# Patient Record
Sex: Male | Born: 1965 | Race: White | Hispanic: No | Marital: Married | Smoking: Never smoker
Health system: Southern US, Community
[De-identification: ages and names within clinical notes are randomized; demographics above are authoritative.]

## PROBLEM LIST (undated history)

## (undated) DIAGNOSIS — I10 Essential (primary) hypertension: Secondary | ICD-10-CM

## (undated) DIAGNOSIS — C7951 Secondary malignant neoplasm of bone: Secondary | ICD-10-CM

## (undated) DIAGNOSIS — Z809 Family history of malignant neoplasm, unspecified: Secondary | ICD-10-CM

## (undated) DIAGNOSIS — C3490 Malignant neoplasm of unspecified part of unspecified bronchus or lung: Secondary | ICD-10-CM

## (undated) DIAGNOSIS — Z5111 Encounter for antineoplastic chemotherapy: Secondary | ICD-10-CM

## (undated) HISTORY — DX: Secondary malignant neoplasm of bone: C79.51

## (undated) HISTORY — DX: Essential (primary) hypertension: I10

## (undated) HISTORY — PX: VASECTOMY: SHX75

## (undated) HISTORY — DX: Malignant neoplasm of unspecified part of unspecified bronchus or lung: C34.90

## (undated) HISTORY — DX: Family history of malignant neoplasm, unspecified: Z80.9

## (undated) HISTORY — DX: Encounter for antineoplastic chemotherapy: Z51.11

---

## 2012-10-20 DIAGNOSIS — IMO0001 Reserved for inherently not codable concepts without codable children: Secondary | ICD-10-CM | POA: Insufficient documentation

## 2013-11-28 DIAGNOSIS — K219 Gastro-esophageal reflux disease without esophagitis: Secondary | ICD-10-CM | POA: Insufficient documentation

## 2014-03-11 DIAGNOSIS — R059 Cough, unspecified: Secondary | ICD-10-CM | POA: Insufficient documentation

## 2014-03-11 DIAGNOSIS — R05 Cough: Secondary | ICD-10-CM | POA: Insufficient documentation

## 2014-03-13 DIAGNOSIS — R9389 Abnormal findings on diagnostic imaging of other specified body structures: Secondary | ICD-10-CM | POA: Insufficient documentation

## 2014-03-14 ENCOUNTER — Other Ambulatory Visit (HOSPITAL_COMMUNITY): Payer: Self-pay | Admitting: Family Medicine

## 2014-03-14 DIAGNOSIS — R222 Localized swelling, mass and lump, trunk: Secondary | ICD-10-CM

## 2014-03-19 ENCOUNTER — Encounter: Payer: Self-pay | Admitting: Pulmonary Disease

## 2014-03-19 ENCOUNTER — Ambulatory Visit (INDEPENDENT_AMBULATORY_CARE_PROVIDER_SITE_OTHER): Payer: BC Managed Care – PPO | Admitting: Pulmonary Disease

## 2014-03-19 VITALS — BP 118/84 | HR 87 | Temp 98.2°F | Ht 74.0 in | Wt 219.6 lb

## 2014-03-19 DIAGNOSIS — R222 Localized swelling, mass and lump, trunk: Secondary | ICD-10-CM

## 2014-03-19 DIAGNOSIS — R911 Solitary pulmonary nodule: Secondary | ICD-10-CM

## 2014-03-19 DIAGNOSIS — R918 Other nonspecific abnormal finding of lung field: Secondary | ICD-10-CM

## 2014-03-19 NOTE — Patient Instructions (Signed)
Bronchoscopy scheduled for Wednesday 1 pm at [facility]  We discussed risks & benefits Nothing to eat after 6am  - Liquid OK prior I will call Dr Cyndia Bent

## 2014-03-19 NOTE — Progress Notes (Signed)
   Subjective:    Patient ID: Oscar Floyd, male    DOB: 17-Mar-1966, 48 y.o.   MRN: [id number]  HPI PCP- Bouska  48 year old never smoker presents for evaluation of abnormal imaging.he is accompanied by his wife Oscar Floyd. He presented to his PCP with a dry cough about 4 weeks ago . He was treated with a prednisone taper and antibiotic. Symptoms persisted and he developed shortness of breath which he would notice around the ninth hole at golf, chest x-ray suggested a right hilar mass. CT chest with contrast was obtained at [facility], this showed a 3.5x4.9 cm mass in the medial right upper lobe. There were innumerable less than 1 cm nodules throughout bilateral lungs. There is no mediastinal or hilar lymphadenopathy. There were many less than 1 cm low-density lesions in the liver. Lytic lesions were noted throughout the spine manubrium sternum and bilateral ribs. Hence he is referred for further evaluation. He denies weight loss, hemoptysis, loss of appetite or bone pains. He takes Nexium for GERD  No past medical history on file.  Past Surgical History  Procedure Laterality Date  . Vasectomy      No Known Allergies  History   Social History  . Marital Status: Married    Spouse Name: N/A    Number of Children: N/A  . Years of Education: N/A   Occupational History  . Not on file.   Social History Main Topics  . Smoking status: Never Smoker   . Smokeless tobacco: Not on file  . Alcohol Use: Yes     Comment: socially  . Drug Use: No  . Sexual Activity: Not on file   Other Topics Concern  . Not on file   Social History Narrative  . No narrative on file    Family History  Problem Relation Age of Onset  . Cancer Father     Review of Systems  Constitutional: Negative for fever and unexpected weight change.  HENT: Negative for congestion, dental problem, ear pain, nosebleeds, postnasal drip, rhinorrhea, sinus pressure, sneezing, sore throat and trouble  swallowing.   Eyes: Negative for redness and itching.  Respiratory: Positive for cough and shortness of breath. Negative for chest tightness and wheezing.   Cardiovascular: Negative for palpitations and leg swelling.  Gastrointestinal: Negative for nausea and vomiting.  Genitourinary: Negative for dysuria.  Musculoskeletal: Negative for joint swelling.  Skin: Negative for rash.  Neurological: Negative for headaches.  Hematological: Does not bruise/bleed easily.  Psychiatric/Behavioral: Negative for dysphoric mood. The patient is not nervous/anxious.        Objective:   Physical Exam  Gen. Pleasant, well-nourished, in no distress, normal affect ENT - no lesions, no post nasal drip Neck: No JVD, no thyromegaly, no carotid bruits Lungs: no use of accessory muscles, no dullness to percussion, clear without rales or rhonchi  Cardiovascular: Rhythm regular, heart sounds  normal, no murmurs or gallops, no peripheral edema Abdomen: soft and non-tender, no hepatosplenomegaly, BS normal. Musculoskeletal: No deformities, no cyanosis or clubbing Neuro:  alert, non focal       Assessment & Plan:

## 2014-03-19 NOTE — Assessment & Plan Note (Signed)
Unfortunately his CT findings are very concerning for metastatic lung malignancy. The primary mass appears to be in the right upper lobe and we can see and airway leading into it. I reviewed the images with the patient and his wife. The various options of biopsy including bronchoscopy, CT guided needle aspiration and surgical biopsy were discussed.The risks of each procedure including coughing, bleeding and the  chances of lung puncture requiring chest tube were discussed in great detail. The benefits & alternatives including serial follow up were also discussed. We will proceed with bronchoscopy under fluoroscopy with the plan to obtain brushings and transbronchial biopsies from the right upper lobe. I would expect the use of 80% with this procedure. If negative then we may have to plan for a second navigation procedure.

## 2014-03-20 ENCOUNTER — Ambulatory Visit (HOSPITAL_COMMUNITY): Payer: BC Managed Care – PPO

## 2014-03-20 ENCOUNTER — Ambulatory Visit (HOSPITAL_COMMUNITY)
Admission: RE | Admit: 2014-03-20 | Discharge: 2014-03-20 | Disposition: A | Discharge status: Home / Self Care | Payer: BC Managed Care – PPO | Source: Ambulatory Visit | Attending: Pulmonary Disease | Admitting: Pulmonary Disease

## 2014-03-20 ENCOUNTER — Encounter (HOSPITAL_COMMUNITY)
Admission: RE | Disposition: A | Discharge status: Home / Self Care | Payer: BC Managed Care – PPO | Source: Ambulatory Visit | Attending: Pulmonary Disease

## 2014-03-20 DIAGNOSIS — K219 Gastro-esophageal reflux disease without esophagitis: Secondary | ICD-10-CM | POA: Insufficient documentation

## 2014-03-20 DIAGNOSIS — M899 Disorder of bone, unspecified: Secondary | ICD-10-CM | POA: Insufficient documentation

## 2014-03-20 DIAGNOSIS — J984 Other disorders of lung: Secondary | ICD-10-CM | POA: Insufficient documentation

## 2014-03-20 DIAGNOSIS — R222 Localized swelling, mass and lump, trunk: Secondary | ICD-10-CM | POA: Diagnosis not present

## 2014-03-20 DIAGNOSIS — M949 Disorder of cartilage, unspecified: Secondary | ICD-10-CM

## 2014-03-20 DIAGNOSIS — C3490 Malignant neoplasm of unspecified part of unspecified bronchus or lung: Secondary | ICD-10-CM | POA: Insufficient documentation

## 2014-03-20 DIAGNOSIS — R918 Other nonspecific abnormal finding of lung field: Secondary | ICD-10-CM

## 2014-03-20 HISTORY — PX: VIDEO BRONCHOSCOPY: SHX5072

## 2014-03-20 SURGERY — BRONCHOSCOPY, WITH FLUOROSCOPY
Anesthesia: Moderate Sedation | Laterality: Bilateral

## 2014-03-20 MED ORDER — LIDOCAINE HCL 2 % EX GEL
CUTANEOUS | Status: DC | PRN
  Administered 2014-03-20: 1

## 2014-03-20 MED ORDER — FENTANYL CITRATE 0.05 MG/ML IJ SOLN
INTRAMUSCULAR | Status: DC | PRN
  Administered 2014-03-20 (×7): 25 ug via INTRAVENOUS

## 2014-03-20 MED ORDER — BUTAMBEN-TETRACAINE-BENZOCAINE 2-2-14 % EX AERO: 1.0000 | INHALATION_SPRAY | Freq: Once | CUTANEOUS | Status: DC

## 2014-03-20 MED ORDER — MIDAZOLAM HCL 10 MG/2ML IJ SOLN
INTRAMUSCULAR | Status: AC
  Filled 2014-03-20: qty 4

## 2014-03-20 MED ORDER — FENTANYL CITRATE 0.05 MG/ML IJ SOLN
INTRAMUSCULAR | Status: AC
  Filled 2014-03-20: qty 4

## 2014-03-20 MED ORDER — PHENYLEPHRINE HCL 0.25 % NA SOLN: 1.0000 | Freq: Four times a day (QID) | NASAL | Status: DC | PRN

## 2014-03-20 MED ORDER — PHENYLEPHRINE HCL 0.25 % NA SOLN
NASAL | Status: DC | PRN
  Administered 2014-03-20: 2 via NASAL

## 2014-03-20 MED ORDER — MIDAZOLAM HCL 10 MG/2ML IJ SOLN
INTRAMUSCULAR | Status: DC | PRN
  Administered 2014-03-20 (×7): 1 mg via INTRAVENOUS

## 2014-03-20 MED ORDER — SODIUM CHLORIDE 0.9 % IV SOLN
INTRAVENOUS | Status: DC
  Administered 2014-03-20: 13:00:00 via INTRAVENOUS

## 2014-03-20 MED ORDER — LIDOCAINE HCL 1 % IJ SOLN
INTRAMUSCULAR | Status: DC | PRN
  Administered 2014-03-20: 6 mL via RESPIRATORY_TRACT

## 2014-03-20 MED ORDER — LIDOCAINE HCL 2 % EX GEL: 1.0000 "application " | Freq: Once | CUTANEOUS | Status: DC

## 2014-03-20 NOTE — Progress Notes (Signed)
Video bronchoscopy performed Intervention bronchial washings Intervention bronchial biopsy Intervention bronchial brushings Pt tolerated well  Kathie Dike RRT

## 2014-03-20 NOTE — Op Note (Signed)
Indication : RUL mass in this never smoker with liver & bone lesions Written informed consent was obtained prior to the procedure. The risks of the procedure including coughing, bleeding and the small chance of lung puncture requiring chest tube were discussed in great detail. The benefits & alternatives including serial follow up were also discussed. Time out was performed   7 mg versed & 175 mcg fentnayl used in divided doses during the procedure Bronchoscope entered from the right nare. Upper airway nml Vocal cords showed nml appearance & motion. Trachea & bronchial tree examined to the subsegmental level. No endobronchial lesions were noted. The Apical & posterior segments appeared narrowed but open distally. Mild amount of white secretions were noted.  Brushings were obtained from all sub segments of the RUL Trans bronchial biopsies x 6 were obtained from the RUL, anterior segment under fluoroscopy. BAL was also obtained from the RUL.  There was moderate coughing  during the procedure.   A CXR will be performed to r/o presence of pneumothorax.  Rigoberto Noel MD  [phone]

## 2014-03-20 NOTE — Discharge Instructions (Signed)
Flexible Bronchoscopy, Care After These instructions give you information on caring for yourself after your procedure. Your doctor may also give you more specific instructions. Call your doctor if you have any problems or questions after your procedure. HOME CARE  Do not eat or drink anything for 2 hours after your procedure. If you try to eat or drink before the medicine wears off, food or drink could go into your lungs. You could also burn yourself.  After 2 hours have passed and when you can cough and gag normally, you may eat soft food and drink liquids slowly.  The day after the test, you may eat your normal diet.  You may do your normal activities.  Keep all doctor visits. GET HELP RIGHT AWAY IF:  You get more and more short of breath.  You get light-headed.  You feel like you are going to pass out (faint).  You have chest pain.  You have new problems that worry you.  You cough up more than a little blood.  You cough up more blood than before. MAKE SURE YOU:  Understand these instructions.  Will watch your condition.  Will get help right away if you are not doing well or get worse. Document Released: 06/27/2009 Document Revised: 09/04/2013 Document Reviewed: 05/04/2013 [facility] Patient Information 2015 [place], [place]. This information is not intended to replace advice given to you by your health care provider. Make sure you discuss any questions you have with your health care provider.  Do not eat or drink until after  3:40 PM Today 03/20/2014

## 2014-03-20 NOTE — H&P (View-Only) (Signed)
   Subjective:    Patient ID: Oscar Floyd, male    DOB: 1966/08/03, 48 y.o.   MRN: [id number]  HPI PCP- Bouska  48 year old never smoker presents for evaluation of abnormal imaging.he is accompanied by his wife Oscar Floyd. He presented to his PCP with a dry cough about 4 weeks ago . He was treated with a prednisone taper and antibiotic. Symptoms persisted and he developed shortness of breath which he would notice around the ninth hole at golf, chest x-ray suggested a right hilar mass. CT chest with contrast was obtained at [facility], this showed a 3.5x4.9 cm mass in the medial right upper lobe. There were innumerable less than 1 cm nodules throughout bilateral lungs. There is no mediastinal or hilar lymphadenopathy. There were many less than 1 cm low-density lesions in the liver. Lytic lesions were noted throughout the spine manubrium sternum and bilateral ribs. Hence he is referred for further evaluation. He denies weight loss, hemoptysis, loss of appetite or bone pains. He takes Nexium for GERD  No past medical history on file.  Past Surgical History  Procedure Laterality Date  . Vasectomy      No Known Allergies  History   Social History  . Marital Status: Married    Spouse Name: N/A    Number of Children: N/A  . Years of Education: N/A   Occupational History  . Not on file.   Social History Main Topics  . Smoking status: Never Smoker   . Smokeless tobacco: Not on file  . Alcohol Use: Yes     Comment: socially  . Drug Use: No  . Sexual Activity: Not on file   Other Topics Concern  . Not on file   Social History Narrative  . No narrative on file    Family History  Problem Relation Age of Onset  . Cancer Father     Review of Systems  Constitutional: Negative for fever and unexpected weight change.  HENT: Negative for congestion, dental problem, ear pain, nosebleeds, postnasal drip, rhinorrhea, sinus pressure, sneezing, sore throat and trouble  swallowing.   Eyes: Negative for redness and itching.  Respiratory: Positive for cough and shortness of breath. Negative for chest tightness and wheezing.   Cardiovascular: Negative for palpitations and leg swelling.  Gastrointestinal: Negative for nausea and vomiting.  Genitourinary: Negative for dysuria.  Musculoskeletal: Negative for joint swelling.  Skin: Negative for rash.  Neurological: Negative for headaches.  Hematological: Does not bruise/bleed easily.  Psychiatric/Behavioral: Negative for dysphoric mood. The patient is not nervous/anxious.        Objective:   Physical Exam  Gen. Pleasant, well-nourished, in no distress, normal affect ENT - no lesions, no post nasal drip Neck: No JVD, no thyromegaly, no carotid bruits Lungs: no use of accessory muscles, no dullness to percussion, clear without rales or rhonchi  Cardiovascular: Rhythm regular, heart sounds  normal, no murmurs or gallops, no peripheral edema Abdomen: soft and non-tender, no hepatosplenomegaly, BS normal. Musculoskeletal: No deformities, no cyanosis or clubbing Neuro:  alert, non focal       Assessment & Plan:

## 2014-03-20 NOTE — Interval H&P Note (Signed)
History and Physical Interval Note:  03/20/2014 1:17 PM  Oscar Floyd  has presented today for surgery, with the diagnosis of lung mass  The various methods of treatment have been discussed with the patient and family. After consideration of risks, benefits and other options for treatment, the patient has consented to  Procedure(s): VIDEO BRONCHOSCOPY WITH FLUORO (Bilateral) and biopsy as a surgical intervention .  The patient's history has been reviewed, patient examined, no change in status, stable for surgery.  I have reviewed the patient's chart and labs.  Questions were answered to the patient's satisfaction.     Oscar Buss V.

## 2014-03-21 ENCOUNTER — Encounter (HOSPITAL_COMMUNITY): Payer: Self-pay | Admitting: Pulmonary Disease

## 2014-03-21 ENCOUNTER — Telehealth: Payer: Self-pay | Admitting: *Deleted

## 2014-03-21 NOTE — Telephone Encounter (Signed)
Called and unable to reach pt.  Will call back

## 2014-03-21 NOTE — Telephone Encounter (Signed)
Spoke with pt set up appt.

## 2014-03-23 LAB — CULTURE, BAL-QUANTITATIVE

## 2014-03-23 LAB — CULTURE, BAL-QUANTITATIVE W GRAM STAIN: Colony Count: 75000

## 2014-03-25 ENCOUNTER — Encounter: Payer: Self-pay | Admitting: *Deleted

## 2014-03-25 ENCOUNTER — Ambulatory Visit (HOSPITAL_COMMUNITY)
Admission: RE | Admit: 2014-03-25 | Discharge: 2014-03-25 | Disposition: A | Discharge status: Home / Self Care | Payer: BC Managed Care – PPO | Source: Ambulatory Visit | Attending: Pulmonary Disease | Admitting: Pulmonary Disease

## 2014-03-25 ENCOUNTER — Other Ambulatory Visit: Payer: Self-pay | Admitting: Internal Medicine

## 2014-03-25 ENCOUNTER — Telehealth: Payer: Self-pay | Admitting: *Deleted

## 2014-03-25 DIAGNOSIS — C3491 Malignant neoplasm of unspecified part of right bronchus or lung: Secondary | ICD-10-CM | POA: Insufficient documentation

## 2014-03-25 DIAGNOSIS — C7951 Secondary malignant neoplasm of bone: Secondary | ICD-10-CM | POA: Insufficient documentation

## 2014-03-25 DIAGNOSIS — C7952 Secondary malignant neoplasm of bone marrow: Secondary | ICD-10-CM

## 2014-03-25 DIAGNOSIS — K7689 Other specified diseases of liver: Secondary | ICD-10-CM | POA: Insufficient documentation

## 2014-03-25 DIAGNOSIS — C3480 Malignant neoplasm of overlapping sites of unspecified bronchus and lung: Secondary | ICD-10-CM

## 2014-03-25 DIAGNOSIS — R911 Solitary pulmonary nodule: Secondary | ICD-10-CM

## 2014-03-25 DIAGNOSIS — C3490 Malignant neoplasm of unspecified part of unspecified bronchus or lung: Secondary | ICD-10-CM | POA: Insufficient documentation

## 2014-03-25 LAB — GLUCOSE, CAPILLARY: Glucose-Capillary: 99 mg/dL (ref 70–99)

## 2014-03-25 MED ORDER — FLUDEOXYGLUCOSE F - 18 (FDG) INJECTION: 11.2000 | Freq: Once | INTRAVENOUS | Status: AC | PRN

## 2014-03-25 NOTE — Telephone Encounter (Signed)
Called left vm message to call

## 2014-03-25 NOTE — [facility] Oncology Navigator Note (Unsigned)
Called pathology at [facility] requesting tissue to be sent for genetic mutation.  Oscar Floyd stated tissue was sent 03/22/14 to [facility] for EGFR/ALK

## 2014-03-26 ENCOUNTER — Encounter: Payer: Self-pay | Admitting: Radiation Oncology

## 2014-03-26 ENCOUNTER — Encounter: Payer: Self-pay | Admitting: *Deleted

## 2014-03-26 ENCOUNTER — Other Ambulatory Visit (HOSPITAL_BASED_OUTPATIENT_CLINIC_OR_DEPARTMENT_OTHER): Payer: BC Managed Care – PPO

## 2014-03-26 ENCOUNTER — Telehealth: Payer: Self-pay | Admitting: *Deleted

## 2014-03-26 ENCOUNTER — Other Ambulatory Visit: Payer: Self-pay | Admitting: Internal Medicine

## 2014-03-26 ENCOUNTER — Ambulatory Visit (HOSPITAL_BASED_OUTPATIENT_CLINIC_OR_DEPARTMENT_OTHER): Payer: BC Managed Care – PPO | Admitting: Internal Medicine

## 2014-03-26 ENCOUNTER — Telehealth: Payer: Self-pay | Admitting: Internal Medicine

## 2014-03-26 ENCOUNTER — Encounter: Payer: Self-pay | Admitting: Internal Medicine

## 2014-03-26 ENCOUNTER — Ambulatory Visit (HOSPITAL_COMMUNITY)
Admission: RE | Admit: 2014-03-26 | Discharge: 2014-03-26 | Disposition: A | Discharge status: Home / Self Care | Payer: BC Managed Care – PPO | Source: Ambulatory Visit | Attending: Internal Medicine | Admitting: Internal Medicine

## 2014-03-26 VITALS — BP 145/90 | HR 73 | Temp 97.9°F | Resp 18 | Ht 74.0 in | Wt 218.2 lb

## 2014-03-26 DIAGNOSIS — M545 Low back pain, unspecified: Secondary | ICD-10-CM

## 2014-03-26 DIAGNOSIS — C7951 Secondary malignant neoplasm of bone: Secondary | ICD-10-CM

## 2014-03-26 DIAGNOSIS — R059 Cough, unspecified: Secondary | ICD-10-CM

## 2014-03-26 DIAGNOSIS — R0609 Other forms of dyspnea: Secondary | ICD-10-CM

## 2014-03-26 DIAGNOSIS — C787 Secondary malignant neoplasm of liver and intrahepatic bile duct: Secondary | ICD-10-CM

## 2014-03-26 DIAGNOSIS — C7949 Secondary malignant neoplasm of other parts of nervous system: Secondary | ICD-10-CM

## 2014-03-26 DIAGNOSIS — C3411 Malignant neoplasm of upper lobe, right bronchus or lung: Secondary | ICD-10-CM

## 2014-03-26 DIAGNOSIS — R0989 Other specified symptoms and signs involving the circulatory and respiratory systems: Secondary | ICD-10-CM

## 2014-03-26 DIAGNOSIS — C3410 Malignant neoplasm of upper lobe, unspecified bronchus or lung: Secondary | ICD-10-CM

## 2014-03-26 DIAGNOSIS — C7931 Secondary malignant neoplasm of brain: Secondary | ICD-10-CM

## 2014-03-26 DIAGNOSIS — C7952 Secondary malignant neoplasm of bone marrow: Secondary | ICD-10-CM

## 2014-03-26 DIAGNOSIS — C3490 Malignant neoplasm of unspecified part of unspecified bronchus or lung: Secondary | ICD-10-CM | POA: Insufficient documentation

## 2014-03-26 DIAGNOSIS — C3480 Malignant neoplasm of overlapping sites of unspecified bronchus and lung: Secondary | ICD-10-CM

## 2014-03-26 DIAGNOSIS — C3491 Malignant neoplasm of unspecified part of right bronchus or lung: Secondary | ICD-10-CM

## 2014-03-26 DIAGNOSIS — R05 Cough: Secondary | ICD-10-CM

## 2014-03-26 DIAGNOSIS — G936 Cerebral edema: Secondary | ICD-10-CM | POA: Insufficient documentation

## 2014-03-26 LAB — CBC WITH DIFFERENTIAL/PLATELET
BASO%: 0.3 % (ref 0.0–2.0)
Basophils Absolute: 0 10*3/uL (ref 0.0–0.1)
EOS%: 2.5 % (ref 0.0–7.0)
Eosinophils Absolute: 0.2 10*3/uL (ref 0.0–0.5)
HCT: 44.7 % (ref 38.4–49.9)
HGB: 15.4 g/dL (ref 13.0–17.1)
LYMPH#: 1.5 10*3/uL (ref 0.9–3.3)
LYMPH%: 16.7 % (ref 14.0–49.0)
MCH: 29.9 pg (ref 27.2–33.4)
MCHC: 34.5 g/dL (ref 32.0–36.0)
MCV: 86.8 fL (ref 79.3–98.0)
MONO#: 0.8 10*3/uL (ref 0.1–0.9)
MONO%: 9 % (ref 0.0–14.0)
NEUT#: 6.4 10*3/uL (ref 1.5–6.5)
NEUT%: 71.5 % (ref 39.0–75.0)
Platelets: 217 10*3/uL (ref 140–400)
RBC: 5.15 10*6/uL (ref 4.20–5.82)
RDW: 13 % (ref 11.0–14.6)
WBC: 9 10*3/uL (ref 4.0–10.3)

## 2014-03-26 LAB — COMPREHENSIVE METABOLIC PANEL (CC13)
ALT: 33 U/L (ref 0–55)
AST: 46 U/L — ABNORMAL HIGH (ref 5–34)
Albumin: 4 g/dL (ref 3.5–5.0)
Alkaline Phosphatase: 754 U/L — ABNORMAL HIGH (ref 40–150)
Anion Gap: 8 mEq/L (ref 3–11)
BUN: 12 mg/dL (ref 7.0–26.0)
CALCIUM: 10.2 mg/dL (ref 8.4–10.4)
CHLORIDE: 106 meq/L (ref 98–109)
CO2: 25 meq/L (ref 22–29)
Creatinine: 1 mg/dL (ref 0.7–1.3)
Glucose: 71 mg/dl (ref 70–140)
Potassium: 4.4 mEq/L (ref 3.5–5.1)
SODIUM: 140 meq/L (ref 136–145)
TOTAL PROTEIN: 7.3 g/dL (ref 6.4–8.3)
Total Bilirubin: 0.7 mg/dL (ref 0.20–1.20)

## 2014-03-26 MED ORDER — GADOBENATE DIMEGLUMINE 529 MG/ML IV SOLN
20.0000 mL | Freq: Once | INTRAVENOUS | Status: AC | PRN
  Administered 2014-03-26: 20 mL via INTRAVENOUS

## 2014-03-26 NOTE — Progress Notes (Signed)
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 male who presented to his primary care physician, Dr. Coletta Memos in June 2015 complaining of dry cough. He was treated with a tapered dose of prednisone and antibiotics with no significant improvement.   Chest x-ray was performed on 03/13/2014 and it showed abnormal right hilar contour with right parahilar interstitial density in the anterior right lung airspace disease.   This was followed by CT scan of the chest with contrast on 03/14/2014 and it showed 3.5 x 4.9 CM mass in the medial inferior right upper lobe. There was innumerable less than 1.0 CM nodules throughout bilateral lungs. There was no mediastinal or hilar lymphadenopathy. Images of the upper abdomen demonstrates innumerable less than 1.0 CM low-density lesions in the liver. The bone windows demonstrate innumerable lytic lesions throughout the visualized the spine, manubrium, sternum and bilateral ribs.   The patient was referred to Dr. Elsworth Soho and on 03/20/2014 he underwent video bronchoscopy with biopsy of the right upper lobe lung mass.   The final pathology (Accession: [id number]) showed poorly differentiated adenocarcinoma of lung primary. There are clusters and single malignant glandular cells arranged in acinar and acinar pattern with significant nuclear pleomorphism and hyperchromasia. The morphologic features are mostly consistent with a poorly differentiated adenocarcinoma.   Immunohistochemical stains were performed and the malignant cells show the following  immunoprofile: Cytokeratin 7-positive, Cytokeratin 20-negative, TTF-1-positive.  Controls stained appropriately. The overall findings are diagnostic for adenocarcinoma of lung primary. The tumor block was sent for EGFR mutation and ALK gene translocation testing, but the results are still pending.   A PET scan was performed on 03/25/2014 and it showed the dominant right upper lobe mass was hypermetabolic consistent with primary bronchogenic carcinoma. There was widespread metastatic disease to the lungs, liver and bones.   An MRI of the brain was performed earlier 03/26/14 and it showed too numerous to count a small enhancing brain metastasis. The lesion range from punctate to 0.6 CM in size and have been associated cerebral edema and no mass effect at this time. There is also bone metastasis in the clivus and upper cervical vertebrae.  The patient has not required any steroids for cerebral edema to date.  PREVIOUS RADIATION THERAPY: No  PAST MEDICAL HISTORY:  has a past medical history of Lung cancer and Hypertension.    PAST SURGICAL HISTORY: Past Surgical History  Procedure Laterality Date  . Vasectomy    . Video bronchoscopy Bilateral 03/20/2014    Procedure: VIDEO BRONCHOSCOPY WITH FLUORO;  Surgeon: Rigoberto Noel, MD;  Location: [facility];  Service: Cardiopulmonary;  Laterality: Bilateral;    FAMILY HISTORY: family history includes Cancer in his father, mother, and paternal grandfather.  SOCIAL HISTORY:  reports that he has never smoked. He has never used smokeless tobacco. He reports that he drinks alcohol. He reports that he does not use illicit drugs.  ALLERGIES: Pollen extract  MEDICATIONS:  Current Outpatient Prescriptions  Medication Sig Dispense Refill  . ALPRAZolam (XANAX) 1 MG tablet Take 1 mg by mouth daily as needed. Take as needed      .  calcium-vitamin D (OSCAL) 250-125 MG-UNIT per tablet Take 1 tablet by mouth daily.      Marland Kitchen esomeprazole (NEXIUM) 20 MG capsule Take  by mouth daily.      . naproxen sodium (ANAPROX) 220 MG tablet Take 220 mg by mouth as needed.      Marland Kitchen BENZONATATE PO Take by mouth as needed.       No current facility-administered medications for this encounter.    REVIEW OF SYSTEMS:  A 15 point review of systems is documented in the electronic medical record. This was obtained by the nursing staff. However, I reviewed this with the patient to discuss relevant findings and make appropriate changes.  A comprehensive review of systems was negative.  No significant sites of bone pain.   PHYSICAL EXAM:  height is _0  (1.88 m) and weight is 220 lb 3.2 oz (99.882 kg). His blood pressure is 122/82 and his pulse is 82. His respiration is 16.   Per med-onc; GXQ:JJHER, healthy, no distress, well nourished, well developed and anxious  SKIN: skin color, texture, turgor are normal, no rashes or significant lesions  HEAD: Normocephalic, No masses, lesions, tenderness or abnormalities  EYES: normal, PERRLA  EARS: External ears normal, Canals clear  OROPHARYNX:no exudate, no erythema and lips, buccal mucosa, and tongue normal  NECK: supple, no adenopathy, no JVD  LYMPH: no palpable lymphadenopathy, no hepatosplenomegaly  LUNGS: clear to auscultation , and palpation  HEART: regular rate & rhythm, no murmurs and no gallops  ABDOMEN:abdomen soft, non-tender, normal bowel sounds and no masses or organomegaly  BACK: Back symmetric, no curvature., No CVA tenderness  EXTREMITIES:no joint deformities, effusion, or inflammation, no edema, no skin discoloration, no clubbing, no cyanosis  NEURO: alert & oriented x 3 with fluent speech, no focal motor/sensory deficits   KPS = 90  100 - Normal; no complaints; no evidence of disease. 90   - Able to carry on normal activity; minor signs or symptoms of disease. 80   - Normal activity with effort; some signs or symptoms of disease. 14   - Cares for self; unable to carry on normal activity or to do active work. 60    - Requires occasional assistance, but is able to care for most of his personal needs. 50   - Requires considerable assistance and frequent medical care. 59   - Disabled; requires special care and assistance. 26   - Severely disabled; hospital admission is indicated although death not imminent. 32   - Very sick; hospital admission necessary; active supportive treatment necessary. 10   - Moribund; fatal processes progressing rapidly. 0     - Dead  Karnofsky DA, Abelmann [place], Craver LS and Burchenal [facility] [phone]) The use of the nitrogen mustards in the palliative treatment of carcinoma: with particular reference to bronchogenic carcinoma Cancer 1 [id number]  LABORATORY DATA:  Lab Results  Component Value Date   WBC 9.0 03/26/2014   HGB 15.4 03/26/2014   HCT 44.7 03/26/2014   MCV 86.8 03/26/2014   PLT 217 03/26/2014   Lab Results  Component Value Date   NA 140 03/26/2014   K 4.4 03/26/2014   CO2 25 03/26/2014   Lab Results  Component Value Date   ALT 33 03/26/2014   AST 46* 03/26/2014   ALKPHOS 754* 03/26/2014   BILITOT 0.70 03/26/2014     RADIOGRAPHY: Mr [name] Wo Contrast  03/26/2014   CLINICAL DATA:  48 year old male with new diagnosis of bronchogenic carcinoma. Metastatic disease on  PET-CT. Staging. Subsequent encounter.  EXAM: MRI HEAD WITHOUT AND WITH CONTRAST  TECHNIQUE: Multiplanar, multiecho pulse sequences of the brain and surrounding structures were obtained without and with intravenous contrast.  CONTRAST:  48m MULTIHANCE GADOBENATE DIMEGLUMINE 529 MG/ML IV SOLN  COMPARISON:  None.  FINDINGS: Numerous small enhancing brain metastases. Greater than 50 individual small metastases are identified. These range from punctate to 6 mm in size (some of the largest lesions are in the right inferior temporal lobe on series 13, image 8, left tail of the hippocampus on series 11, image 22, and left cerebellum on series 11, image 13). Despite the numerous lesions there is minimal cerebral edema, and no  associated mass effect.  No dural thickening or hyper enhancement identified. No ventriculomegaly. No acute intracranial hemorrhage identified. No restricted diffusion or evidence of acute infarction. Negative pituitary and cervicomedullary junction. Grossly negative visualized upper cervical spinal cord.  Metastatic bone disease in the clivus (series 3, image 14) and possibly also scattered in the upper cervical vertebrae (central C2 vertebra on series 10, image 13). No definite calvarium bone metastasis.  Major intracranial vascular flow voids are preserved. Visible internal auditory structures appear normal. Mastoids are clear. Visualized orbit soft tissues are within normal limits. Minor paranasal sinus mucosal thickening. Visualized scalp soft tissues are within normal limits.  IMPRESSION: 1. Too numerous to count small enhancing brain metastases. Lesions range from punctate to 6 mm in size and have minimal associated cerebral edema and no mass effect at this time. 2. Bone metastases in the clivus and upper cervical vertebrae.   Electronically Signed   By: LLars PinksM.D.   On: 03/26/2014 09:31   Nm Pet Image Initial (pi) Skull Base To Thigh  03/25/2014   CLINICAL DATA:  Initial treatment strategy for metastatic non-small-cell lung cancer.  EXAM: NUCLEAR MEDICINE PET SKULL BASE TO THIGH  TECHNIQUE: 11.2 mCi F-18 FDG was injected intravenously. Full-ring PET imaging was performed from the skull base to thigh after the radiotracer. CT data was obtained and used for attenuation correction and anatomic localization.  FASTING BLOOD GLUCOSE:  Value: 99 mg/dl  COMPARISON:  Chest CT 03/14/2014.  FINDINGS: NECK  No hypermetabolic cervical lymph nodes are identified.There are no lesions of the pharyngeal mucosal space.  CHEST  The dominant mass radiating anteriorly from the right upper lobe is hypermetabolic. This demonstrates an SUV max of 9.5. Peripheral components of this may be related to postobstructive  pneumonitis. The innumerable pulmonary nodules demonstrated throughout the lungs are suboptimally evaluated based on size, but highly suspicious for metastatic disease as well. These are more numerous on the right, likely in part secondary to lymphangitic spread of tumor. There is no hypermetabolic mediastinal, hilar or axillary nodal activity. There is trace pleural fluid on the right without abnormal metabolic activity.  ABDOMEN/PELVIS  The hepatic activity is heterogeneous with several focal hypermetabolic lesions consistent with metastatic disease. The most FDG avid lesion is centrally in the right hepatic lobe with an SUV max of 5.8. There is no evidence metastatic disease to the spleen, pancreas or adrenal glands. There is no hypermetabolic nodal activity.  SKELETON  There is widespread hypermetabolic metastatic disease to the bones. Multiple lytic lesions are present within the ribs, sternum, spine, pelvis and proximal femurs. No pathologic fractures or large epidural components are identified.  IMPRESSION: 1. The dominant right upper lobe mass is hypermetabolic consistent with primary bronchogenic carcinoma. 2. There is widespread metastatic disease to the lungs, liver and bones.  Electronically Signed   By: Camie Patience M.D.   On: 03/25/2014 13:39   Dg Chest Port 1 View  03/20/2014   CLINICAL DATA:  Post right upper lobe mass biopsy  EXAM: PORTABLE CHEST - 1 VIEW  COMPARISON:  Chest radiograph March 13, 2014 and chest CT March 14, 2014  FINDINGS: There is no demonstrable pneumothorax. The mass in the right upper lobe is again noted. There is widespread interstitial prominence with multiple tiny nodular lesions throughout the lungs. Question lymphangitic spread of tumor. Heart size and pulmonary vascularity are normal. Adenopathy is not appreciated on this study.  IMPRESSION: No pneumothorax. Persistent right upper lobe mass with questionable lymphangitic spread of tumor diffusely.   Electronically Signed    By: Lowella Grip M.D.   On: 03/20/2014 14:16   Dg C-arm Bronchoscopy  03/20/2014   CLINICAL DATA: bronch procedure   C-ARM BRONCHOSCOPY  Fluoroscopy was utilized by the requesting physician.  No radiographic  interpretation.       IMPRESSION: This patient is a very nice 48 yo man with more than 50 sub-centimeter asymptomatic brain metastases from metastatic right upper lobe non-small cell lung cancer.  At this point, the patient may beneiit from whole brain radiotherapy.  PLAN:Today, I talked to the patient and family about the findings and work-up thus far.  We discussed the natural history of brain metastases and stage IV lung cancer in terms of general treatment, highlighting the role or radiotherapy in the management.  We discussed the available radiation techniques, and focused on the details of logistics and delivery.  We reviewed the anticipated acute and late sequelae associated with radiation in this setting.  The patient was encouraged to ask questions that I answered to the best of my ability.  I filled out a patient counseling form during our discussion including treatment diagrams.  We retained a copy for our records.  The patient would like to proceed with radiation and will be scheduled for CT simulation.  I spent 60 minutes minutes face to face with the patient and more than 50% of that time was spent in counseling and/or coordination of care.   ------------------------------------------------  Sheral Apley. Tammi Klippel, M.D.

## 2014-03-26 NOTE — Telephone Encounter (Signed)
Patient called and got medical clearance form dentistt for xgeva.  I will call dentist office to get written consent.

## 2014-03-26 NOTE — Telephone Encounter (Signed)
C/D 03/25/14 for appt. 03/26/14

## 2014-03-26 NOTE — Progress Notes (Signed)
Called and spoke with dentist, Dr. Orson Aloe.  She will fax letter for pt to received Xgeva.  Dr. Julien Nordmann is aware.

## 2014-03-26 NOTE — [facility] Oncology Navigator Note (Unsigned)
Spoke with pt and family today at [facility] with Dr. Julien Nordmann.  Gave and explained lung cancer information.  Pt aware of appt with Rad Onc tomorrow.

## 2014-03-26 NOTE — Progress Notes (Signed)
[place] [phone]) [phone]   [phone]  CONSULT NOTE  REFERRING PHYSICIAN: Dr. Chrissie Floyd  REASON FOR CONSULTATION:  48 years old white male with metastatic lung cancer.  HPI Oscar Floyd is a 48 y.o. male a never smoker with no significant past medical history except for borderline hypertension. The patient presented to his primary care physician, Oscar Floyd in the middle of June 2015 complaining of weeks' duration of dry cough. He was treated with a tapered dose of prednisone and antibiotics with no significant improvement. Chest x-ray was performed on 03/13/2014 and it showed abnormal right hilar contour with right parahilar interstitial density in the anterior right lung airspace disease. This was followed by CT scan of the chest with contrast on 03/14/2014 and it showed 3.5 x 4.9 CM mass in the medial inferior right upper lobe. There was innumerable less than 1.0 CM nodules throughout bilateral lungs. There was no mediastinal or hilar lymphadenopathy. Images of the upper abdomen demonstrates innumerable less than 1.0 CM low-density lesions in the liver. The bone windows demonstrate innumerable lytic lesions throughout the visualized the spine, manubrium, sternum and bilateral ribs.  The patient was referred to Oscar Floyd and on 03/20/2014 he underwent video bronchoscopy with biopsy of the right upper lobe lung mass.  The final pathology (Accession: [id number]) showed poorly differentiated adenocarcinoma of lung primary. There are clusters and single malignant glandular cells arranged in acinar and acinar pattern with significant nuclear pleomorphism and hyperchromasia. The morphologic features are mostly consistent with a poorly differentiated adenocarcinoma. Immunohistochemical stains were performed and the malignant cells show the following immunoprofile: Cytokeratin 7-positive, Cytokeratin 20-negative, TTF-1-positive. Controls stained appropriately. The  overall findings are diagnostic for adenocarcinoma of lung primary. The tumor block was sent for EGFR mutation and ALK gene translocation testing, but the results are still pending. A PET scan was performed on 03/25/2014 and it showed the dominant right upper lobe mass was hypermetabolic consistent with primary bronchogenic carcinoma. There was widespread metastatic disease to the lungs, liver and bones.  I ordered an MRI of the brain which was performed earlier today and it showed too numerous to count a small enhancing brain metastasis. The lesion range from punctate to 0.6 CM in size and have been associated cerebral edema and no mass effect at this time. There is also bone metastasis in the clivus and upper cervical vertebrae. When seen today the patient continues to complain of mild cough but it is much less than when he started a few weeks ago. He also has shortness of breath with exertion but no significant chest pain or hemoptysis. He denied having any significant pain except for intermittent low back pain. The patient denied having any significant nausea or vomiting her weight loss. He has no headache or visual changes.  Family history significant for her mother with GIST, and father died in 02-08-14 with liposarcoma. Maternal grandmother had lung cancer. The patient is married and has 2 sons age 83 and 77. He was accompanied today by his wife Oscar Floyd and his mother Oscar Floyd. 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 He has no history of smoking. He drinks alcohol occasionally and no history of drug abuse.  HPI  History reviewed. No pertinent past medical history.  Past Surgical History  Procedure Laterality Date  . Vasectomy    . Video bronchoscopy Bilateral 03/20/2014    Procedure: VIDEO BRONCHOSCOPY WITH FLUORO;  Surgeon: Oscar Noel, MD;  Location: [facility];  Service: Cardiopulmonary;  Laterality: Bilateral;    Family History  Problem Relation Age of Onset  . Cancer  Father     Social History History  Substance Use Topics  . Smoking status: Never Smoker   . Smokeless tobacco: Not on file  . Alcohol Use: Yes     Comment: socially    Allergies  Allergen Reactions  . Pollen Extract     Current Outpatient Prescriptions  Medication Sig Dispense Refill  . ALPRAZolam (XANAX) 1 MG tablet Take 1 mg by mouth daily as needed. Take as needed      . esomeprazole (NEXIUM) 20 MG capsule Take by mouth daily.      . naproxen sodium (ANAPROX) 220 MG tablet Take 220 mg by mouth as needed.      Marland Kitchen BENZONATATE PO Take by mouth as needed.       No current facility-administered medications for this visit.    Review of Systems  Constitutional: negative Eyes: negative Ears, nose, mouth, throat, and face: negative Respiratory: positive for cough and dyspnea on exertion Cardiovascular: negative Gastrointestinal: negative Genitourinary:negative Integument/breast: negative Hematologic/lymphatic: negative Musculoskeletal:negative Neurological: negative Behavioral/Psych: negative Endocrine: negative Allergic/Immunologic: negative  Physical Exam  KZS:WFUXN, healthy, no distress, well nourished, well developed and anxious SKIN: skin color, texture, turgor are normal, no rashes or significant lesions HEAD: Normocephalic, No masses, lesions, tenderness or abnormalities EYES: normal, PERRLA EARS: External ears normal, Canals clear OROPHARYNX:no exudate, no erythema and lips, buccal mucosa, and tongue normal  NECK: supple, no adenopathy, no JVD LYMPH:  no palpable lymphadenopathy, no hepatosplenomegaly LUNGS: clear to auscultation , and palpation HEART: regular rate & rhythm, no murmurs and no gallops ABDOMEN:abdomen soft, non-tender, normal bowel sounds and no masses or organomegaly BACK: Back symmetric, no curvature., No CVA tenderness EXTREMITIES:no joint deformities, effusion, or inflammation, no edema, no skin discoloration, no clubbing, no cyanosis    NEURO: alert & oriented x 3 with fluent speech, no focal motor/sensory deficits  PERFORMANCE STATUS: ECOG 0-1  LABORATORY DATA: Lab Results  Component Value Date   WBC 9.0 Apr 17, 2014   HGB 15.4 04-17-14   HCT 44.7 2014/04/17   MCV 86.8 April 17, 2014   PLT 217 04-17-14      Chemistry      Component Value Date/Time   NA 140 2014-04-17 1059   K 4.4 2014/04/17 1059   CO2 25 04/17/14 1059   BUN 12.0 04/17/2014 1059   CREATININE 1.0 April 17, 2014 1059      Component Value Date/Time   CALCIUM 10.2 04/17/14 1059   ALKPHOS 754* 04/17/2014 1059   AST 46* Apr 17, 2014 1059   ALT 33 04-17-14 1059   BILITOT 0.70 April 17, 2014 1059       RADIOGRAPHIC STUDIES: Mr [name] Wo Contrast  2014-04-17   CLINICAL DATA:  49 year old male with new diagnosis of bronchogenic carcinoma. Metastatic disease on PET-CT. Staging. Subsequent encounter.  EXAM: MRI HEAD WITHOUT AND WITH CONTRAST  TECHNIQUE: Multiplanar, multiecho pulse sequences of the brain and surrounding structures were obtained without and with intravenous contrast.  CONTRAST:  8m MULTIHANCE GADOBENATE DIMEGLUMINE 529 MG/ML IV SOLN  COMPARISON:  None.  FINDINGS: Numerous small enhancing brain metastases. Greater than 50 individual small metastases are identified. These range from punctate to 6 mm in size (some of the largest lesions are in the right inferior temporal lobe on series 13, image 8, left tail of the hippocampus on series 11, image 22, and left cerebellum on series 11, image 13). Despite the numerous lesions there is  minimal cerebral edema, and no associated mass effect.  No dural thickening or hyper enhancement identified. No ventriculomegaly. No acute intracranial hemorrhage identified. No restricted diffusion or evidence of acute infarction. Negative pituitary and cervicomedullary junction. Grossly negative visualized upper cervical spinal cord.  Metastatic bone disease in the clivus (series 3, image 14) and possibly also scattered in the upper  cervical vertebrae (central C2 vertebra on series 10, image 13). No definite calvarium bone metastasis.  Major intracranial vascular flow voids are preserved. Visible internal auditory structures appear normal. Mastoids are clear. Visualized orbit soft tissues are within normal limits. Minor paranasal sinus mucosal thickening. Visualized scalp soft tissues are within normal limits.  IMPRESSION: 1. Too numerous to count small enhancing brain metastases. Lesions range from punctate to 6 mm in size and have minimal associated cerebral edema and no mass effect at this time. 2. Bone metastases in the clivus and upper cervical vertebrae.   Electronically Signed   By: Lars Pinks M.D.   On: 03/26/2014 09:31   Nm Pet Image Initial (pi) Skull Base To Thigh  03/25/2014   CLINICAL DATA:  Initial treatment strategy for metastatic non-small-cell lung cancer.  EXAM: NUCLEAR MEDICINE PET SKULL BASE TO THIGH  TECHNIQUE: 11.2 mCi F-18 FDG was injected intravenously. Full-ring PET imaging was performed from the skull base to thigh after the radiotracer. CT data was obtained and used for attenuation correction and anatomic localization.  FASTING BLOOD GLUCOSE:  Value: 99 mg/dl  COMPARISON:  Chest CT 03/14/2014.  FINDINGS: NECK  No hypermetabolic cervical lymph nodes are identified.There are no lesions of the pharyngeal mucosal space.  CHEST  The dominant mass radiating anteriorly from the right upper lobe is hypermetabolic. This demonstrates an SUV max of 9.5. Peripheral components of this may be related to postobstructive pneumonitis. The innumerable pulmonary nodules demonstrated throughout the lungs are suboptimally evaluated based on size, but highly suspicious for metastatic disease as well. These are more numerous on the right, likely in part secondary to lymphangitic spread of tumor. There is no hypermetabolic mediastinal, hilar or axillary nodal activity. There is trace pleural fluid on the right without abnormal metabolic  activity.  ABDOMEN/PELVIS  The hepatic activity is heterogeneous with several focal hypermetabolic lesions consistent with metastatic disease. The most FDG avid lesion is centrally in the right hepatic lobe with an SUV max of 5.8. There is no evidence metastatic disease to the spleen, pancreas or adrenal glands. There is no hypermetabolic nodal activity.  SKELETON  There is widespread hypermetabolic metastatic disease to the bones. Multiple lytic lesions are present within the ribs, sternum, spine, pelvis and proximal femurs. No pathologic fractures or large epidural components are identified.  IMPRESSION: 1. The dominant right upper lobe mass is hypermetabolic consistent with primary bronchogenic carcinoma. 2. There is widespread metastatic disease to the lungs, liver and bones.   Electronically Signed   By: Camie Patience M.D.   On: 03/25/2014 13:39   Dg Chest Port 1 View  03/20/2014   CLINICAL DATA:  Post right upper lobe mass biopsy  EXAM: PORTABLE CHEST - 1 VIEW  COMPARISON:  Chest radiograph March 13, 2014 and chest CT March 14, 2014  FINDINGS: There is no demonstrable pneumothorax. The mass in the right upper lobe is again noted. There is widespread interstitial prominence with multiple tiny nodular lesions throughout the lungs. Question lymphangitic spread of tumor. Heart size and pulmonary vascularity are normal. Adenopathy is not appreciated on this study.  IMPRESSION: No pneumothorax. Persistent right upper  lobe mass with questionable lymphangitic spread of tumor diffusely.   Electronically Signed   By: Lowella Grip M.D.   On: 03/20/2014 14:16   Dg C-arm Bronchoscopy  03/20/2014   CLINICAL DATA: bronch procedure   C-ARM BRONCHOSCOPY  Fluoroscopy was utilized by the requesting physician.  No radiographic  interpretation.     ASSESSMENT: This is a very pleasant never smoker 48 years old white male with recently diagnosed with stage IV (T2a, N0, M1b) non-small cell lung cancer consistent with poorly  differentiated adenocarcinoma diagnosed in July of 2015 and presented with right upper lobe lung mass in addition to extensive liver, brain and bone metastases.   PLAN: I had a lengthy discussion with the patient and his family today about his current disease stage, prognosis and treatment options. I explained to the patient that he has incurable condition and on the treatment would be of palliative nature. He was given the option of palliative care and hospice versus consolidation of systemic treatment with either systemic chemotherapy or target biologic agent if he has a positive EGFR mutation or ALK gene translocation. The patient is interested in treatment. The tumor block was sent for EGFR mutation and ALK gene translocation testing but the results are still pending. If his tumor is negative for EGFR and ALK mutation, I may consider the tissue block to [facility] one for the extensive molecular biomarkers study but I will start his systemic chemotherapy with cisplatin and Alimta. I discussed with the patient adverse effects of the chemotherapy including but not limited to alopecia, myelosuppression, nausea and vomiting, peripheral neuropathy, liver or renal dysfunction. I also referred the patient to Dr. Tammi Klippel for consideration of whole brain irradiation as well as palliative radiotherapy to the metastatic bone lesion in the cervical spine. Dr. Tammi Klippel kindly agreed to see the patient tomorrow. The patient may also benefit from MRI of the cervical spines to rule out cord compression. I would also consider starting the patient on Xgeva for a metastatic bone disease once I receive a dental clearance from his dentist. He was also advised of the risk of osteonecrosis of the jaw with this treatment. He was advised to start taking calcium and vitamin D 2 tablets daily. Had a lengthy discussion with the patient and his family about his prognosis with and without treatment. I gave the patient and his  wife the time to ask questions and I answered them completely to their satisfaction. I will arrange for the patient a followup appointment with me once the molecular studies are available for more detailed discussion of his treatment options. The patient voices understanding of current disease status and treatment options and is in agreement with the current care plan.  All questions were answered. The patient knows to call the clinic with any problems, questions or concerns. We can certainly see the patient much sooner if necessary.  Thank you so much for allowing me to participate in the care of Oscar Floyd. I will continue to follow up the patient with you and assist in his care.  I spent 60 minutes counseling the patient face to face. The total time spent in the appointment was 80 minutes.  Disclaimer: This note was dictated with voice recognition software. Similar sounding words can inadvertently be transcribed and may not be corrected upon review.   Toddrick Sanna K. 03/26/2014, 12:51 PM

## 2014-03-26 NOTE — Progress Notes (Signed)
Location/Histology of Brain Tumor: greater than 50 small enhancing brain metastatases  Patient presented with symptoms of:  Presented to PCP complaining of nasal congestion and dry cough x1 month. Productive cough with clear sputum, shortness of breath during cough, chest pain with cough, barking  Past or anticipated interventions, if any, per neurosurgery: no  Past or anticipated interventions, if any, per medical oncology: yes, referral to radiation oncology  Dose of Decadron, if applicable:   Recent neurologic symptoms, if any:   Seizures: no  Headaches: no  Nausea: no  Dizziness/ataxia: no  Difficulty with hand coordination: no  Focal numbness/weakness: no  Visual deficits/changes: no  Confusion/Memory deficits: no  Painful bone metastases at present, if any: yes, bone mets are present but, there is no notes indicating they are painful  SAFETY ISSUES:  Prior radiation? no  Pacemaker/ICD? no  Possible current pregnancy? no  Is the patient on methotrexate? no  Additional Complaints / other details: 48 year old male. Patient with liver, bone and brain mets. Never a smoker. Married to Journalist, newspaper.

## 2014-03-27 ENCOUNTER — Encounter: Payer: Self-pay | Admitting: *Deleted

## 2014-03-27 ENCOUNTER — Ambulatory Visit
Admission: RE | Admit: 2014-03-27 | Discharge: 2014-03-27 | Disposition: A | Discharge status: Home / Self Care | Payer: BC Managed Care – PPO | Source: Ambulatory Visit | Attending: Radiation Oncology | Admitting: Radiation Oncology

## 2014-03-27 ENCOUNTER — Encounter: Payer: Self-pay | Admitting: Radiation Oncology

## 2014-03-27 VITALS — BP 122/82 | HR 82 | Resp 16 | Ht 74.0 in | Wt 220.2 lb

## 2014-03-27 DIAGNOSIS — C3410 Malignant neoplasm of upper lobe, unspecified bronchus or lung: Secondary | ICD-10-CM | POA: Insufficient documentation

## 2014-03-27 DIAGNOSIS — Z79899 Other long term (current) drug therapy: Secondary | ICD-10-CM | POA: Insufficient documentation

## 2014-03-27 DIAGNOSIS — R112 Nausea with vomiting, unspecified: Secondary | ICD-10-CM | POA: Insufficient documentation

## 2014-03-27 DIAGNOSIS — C787 Secondary malignant neoplasm of liver and intrahepatic bile duct: Secondary | ICD-10-CM | POA: Diagnosis not present

## 2014-03-27 DIAGNOSIS — C7949 Secondary malignant neoplasm of other parts of nervous system: Principal | ICD-10-CM

## 2014-03-27 DIAGNOSIS — C7931 Secondary malignant neoplasm of brain: Secondary | ICD-10-CM | POA: Diagnosis not present

## 2014-03-27 DIAGNOSIS — Z510 Encounter for antineoplastic radiation therapy: Secondary | ICD-10-CM | POA: Insufficient documentation

## 2014-03-27 LAB — CEA: CEA: 310.6 ng/mL — ABNORMAL HIGH (ref 0.0–5.0)

## 2014-03-27 NOTE — Progress Notes (Signed)
Spoke will dentist today.  I had not received clearance for pt to receive Xgeva.  She stated she faxed yesterday but I did not receive.  She re-faxed and I received.  I notified Dr. Julien Nordmann of clearance.

## 2014-03-27 NOTE — [facility] Oncology Navigator Note (Unsigned)
Spoke with pt and wife at [facility] today.  I updated them on plan of care.  They verbalized understanding.

## 2014-03-27 NOTE — Progress Notes (Signed)
Reports cough has mostly resolve except for a "brief catching dry cough" after he talks a lot. Denies difficulty swallowing. Reports shortness of breath with exertion. Advised by medical oncology to begin taking calcium plus vitamin d. He and his wife are traveling to the [employer]. Weight and vitals stable. Denies pain. Denies headache, dizziness, nausea, vomiting, visual deficits or ringing in the ears. Denies short term memory loss. Answers quickly and appropriately to all questions. Denies bone pain.

## 2014-03-28 ENCOUNTER — Encounter: Payer: Self-pay | Admitting: *Deleted

## 2014-03-28 ENCOUNTER — Encounter: Payer: Self-pay | Admitting: Internal Medicine

## 2014-03-28 ENCOUNTER — Ambulatory Visit
Admission: RE | Admit: 2014-03-28 | Discharge: 2014-03-28 | Disposition: A | Discharge status: Home / Self Care | Payer: BC Managed Care – PPO | Source: Ambulatory Visit | Attending: Radiation Oncology | Admitting: Radiation Oncology

## 2014-03-28 ENCOUNTER — Encounter (HOSPITAL_COMMUNITY): Payer: Self-pay

## 2014-03-28 DIAGNOSIS — C7931 Secondary malignant neoplasm of brain: Secondary | ICD-10-CM

## 2014-03-28 DIAGNOSIS — C7949 Secondary malignant neoplasm of other parts of nervous system: Principal | ICD-10-CM

## 2014-03-28 NOTE — Progress Notes (Signed)
Faxed gilotrif form to [phone]

## 2014-03-28 NOTE — [facility] Oncology Navigator Note (Unsigned)
Completed request form for Gilotrif fast start enrollment.  Gave to [employer]

## 2014-03-28 NOTE — Progress Notes (Signed)
  Radiation Oncology         [phone] ________________________________  Name: Oscar Floyd MRN: [id number]  Date: 03/28/2014  DOB: 18-Jul-1966  SIMULATION AND TREATMENT PLANNING NOTE  DIAGNOSIS:  48 yo man with numerous sub-centimeter brain metastases from cancer of the right upper lobe of the lung  NARRATIVE:  The patient was brought to the [facility].  Identity was confirmed.  All relevant records and images related to the planned course of therapy were reviewed.  The patient freely provided informed written consent to proceed with treatment after reviewing the details related to the planned course of therapy. The consent form was witnessed and verified by the simulation staff.  Then, the patient was set-up in a stable reproducible  supine position for radiation therapy.  CT images were obtained.  Surface markings were placed.  The CT images were loaded into the planning software.  Then the target and avoidance structures were contoured.  Treatment planning then occurred.  The radiation prescription was entered and confirmed.  Then, I designed and supervised the construction of a total of 3 medically necessary complex treatment devices including a thermoplastic mask and 2 MLC collimators to shape the radiation from two distinct gantry positions.  I have requested : Isodose Plan.   PLAN:  The patient will receive 30 Gy in 10 fractions.  ________________________________  Sheral Apley Tammi Klippel, M.D.

## 2014-03-28 NOTE — Progress Notes (Signed)
Called and spoke with wife and pt regarding update on pathology and appt's.  They are aware of EGFR+ status.  I have arranged follow up appt's.  They verbalized understanding.

## 2014-03-29 ENCOUNTER — Encounter (HOSPITAL_COMMUNITY): Payer: Self-pay

## 2014-03-29 ENCOUNTER — Telehealth: Payer: Self-pay | Admitting: *Deleted

## 2014-03-29 NOTE — Telephone Encounter (Signed)
Error

## 2014-03-29 NOTE — Telephone Encounter (Signed)
Received fax from [facility] requesting lab and chart notes related to patient's diagnosis for his Gilotrif prescription. Records faxed to [phone].

## 2014-03-29 NOTE — Telephone Encounter (Signed)
Patient requests chemo class on Tuesday, 04/02/14 be moved closer to his radiation treatment. Patient rescheduled for class at 12pm. Patient notified of this change.

## 2014-04-01 ENCOUNTER — Ambulatory Visit
Admission: RE | Admit: 2014-04-01 | Discharge: 2014-04-01 | Disposition: A | Discharge status: Home / Self Care | Payer: BC Managed Care – PPO | Source: Ambulatory Visit | Attending: Radiation Oncology | Admitting: Radiation Oncology

## 2014-04-01 ENCOUNTER — Telehealth: Payer: Self-pay | Admitting: Medical Oncology

## 2014-04-01 ENCOUNTER — Encounter: Payer: Self-pay | Admitting: *Deleted

## 2014-04-01 ENCOUNTER — Ambulatory Visit (HOSPITAL_BASED_OUTPATIENT_CLINIC_OR_DEPARTMENT_OTHER): Payer: BC Managed Care – PPO | Admitting: Physician Assistant

## 2014-04-01 ENCOUNTER — Telehealth: Payer: Self-pay | Admitting: Physician Assistant

## 2014-04-01 ENCOUNTER — Telehealth: Payer: Self-pay | Admitting: *Deleted

## 2014-04-01 ENCOUNTER — Encounter: Payer: Self-pay | Admitting: Radiation Oncology

## 2014-04-01 ENCOUNTER — Encounter: Payer: Self-pay | Admitting: Physician Assistant

## 2014-04-01 ENCOUNTER — Telehealth: Payer: Self-pay | Admitting: Internal Medicine

## 2014-04-01 VITALS — BP 134/92 | HR 94 | Temp 98.4°F | Resp 18 | Ht 74.0 in | Wt 218.9 lb

## 2014-04-01 DIAGNOSIS — C3490 Malignant neoplasm of unspecified part of unspecified bronchus or lung: Secondary | ICD-10-CM

## 2014-04-01 DIAGNOSIS — C3410 Malignant neoplasm of upper lobe, unspecified bronchus or lung: Secondary | ICD-10-CM

## 2014-04-01 DIAGNOSIS — C7949 Secondary malignant neoplasm of other parts of nervous system: Secondary | ICD-10-CM

## 2014-04-01 DIAGNOSIS — C7951 Secondary malignant neoplasm of bone: Secondary | ICD-10-CM

## 2014-04-01 DIAGNOSIS — C787 Secondary malignant neoplasm of liver and intrahepatic bile duct: Secondary | ICD-10-CM

## 2014-04-01 DIAGNOSIS — C7931 Secondary malignant neoplasm of brain: Secondary | ICD-10-CM

## 2014-04-01 DIAGNOSIS — C7952 Secondary malignant neoplasm of bone marrow: Secondary | ICD-10-CM

## 2014-04-01 NOTE — Telephone Encounter (Signed)
Confirmed appts today and tomorrow with pt.

## 2014-04-01 NOTE — Telephone Encounter (Signed)
s.w. pt and advised on time change....pt ok and aware

## 2014-04-01 NOTE — Telephone Encounter (Signed)
Pt confirmed labs/ov per 07/20 POF, gave pt AVS....KJ °

## 2014-04-01 NOTE — Progress Notes (Signed)
Spoke with pt at [facility] today.  Gave and explained information for lung cancer and his oral biologic medication.

## 2014-04-01 NOTE — Progress Notes (Addendum)
No images are attached to the encounter. No scans are attached to the encounter. No scans are attached to the encounter. [facility] SHARED VISIT PROGRESS NOTE  [name], MD [address] 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    Primary site: Lung (Right)   Staging method: AJCC 7th Edition   Clinical: Stage IV (T2a, N0, M1b) signed by [name], MD on 03/26/2014  4:57 PM   Summary: Stage IV (T2a, N0, M1b)  PRIOR THERAPY: none  CURRENT THERAPY: 1. Whole brain radiation under the care of Dr. Tammi Klippel 2. Gilotrif 40 mg po daily expected to start in the next few days.  DISEASE STAGE: Lung cancer   Primary site: Lung (Right)   Staging method: AJCC 7th Edition   Clinical: Stage IV (T2a, N0, M1b) signed by [name], MD on 03/26/2014  4:57 PM   Summary: Stage IV (T2a, N0, M1b)  CHEMOTHERAPY INTENT: palliative  CURRENT # OF CHEMOTHERAPY CYCLES: 0  CURRENT ANTIEMETICS: none  CURRENT SMOKING STATUS: non-smoker/never smoker  ORAL CHEMOTHERAPY AND CONSENT: yes- Gilotrif  CURRENT BISPHOSPHONATES USE: none  PAIN MANAGEMENT: none  NARCOTICS INDUCED CONSTIPATION: none  LIVING WILL AND CODE STATUS:    INTERVAL HISTORY: Oscar Floyd 48 y.o. male returns for a scheduled regular office visit for followup of his recently diagnosed stage IV (T2 A., N0, M1B) non-small cell lung cancer consistent with poorly differentiated adenocarcinoma. The molecular studies reveal his EGFR positive at exon 19. He presents today to discuss treatment with Gilotrif. He started whole brain radiation later today to the care Dr. Tammi Klippel. He voices no specific complaints today   MEDICAL  HISTORY: Past Medical History  Diagnosis Date  . Lung cancer     RUL lung with mets to liver, bone and brain  . Hypertension     ALLERGIES:  is allergic to pollen extract.  MEDICATIONS:  Current Outpatient Prescriptions  Medication Sig Dispense Refill  . ALPRAZolam (XANAX) 1 MG tablet Take 1 mg by mouth daily as needed. Take as needed      . BENZONATATE PO Take by mouth as needed.      . calcium-vitamin D (OSCAL) 250-125 MG-UNIT per tablet Take 1 tablet by mouth daily.      Marland Kitchen esomeprazole (NEXIUM) 20 MG capsule Take by mouth daily.      . naproxen sodium (ANAPROX) 220 MG tablet Take 220 mg by mouth as needed.       No current facility-administered medications for this visit.    SURGICAL HISTORY:  Past Surgical History  Procedure Laterality Date  . Vasectomy    . Video bronchoscopy Bilateral 03/20/2014    Procedure: VIDEO BRONCHOSCOPY WITH FLUORO;  Surgeon: Rigoberto Noel, MD;  Location: [facility];  Service: Cardiopulmonary;  Laterality: Bilateral;    REVIEW OF SYSTEMS:  A comprehensive review of systems was negative.   PHYSICAL EXAMINATION: General appearance: alert, cooperative, appears stated age and no distress Head: Normocephalic, without obvious abnormality, atraumatic Neck: no adenopathy, no carotid bruit, no JVD, supple, symmetrical, trachea midline and thyroid not enlarged, symmetric, no tenderness/mass/nodules Lymph nodes: Cervical, supraclavicular, and axillary nodes normal. Resp: clear to auscultation bilaterally Back: symmetric, no curvature. ROM normal. No CVA tenderness. Cardio: regular rate  and rhythm, S1, S2 normal, no murmur, click, rub or gallop GI: soft, non-tender; bowel sounds normal; no masses,  no organomegaly Extremities: extremities normal, atraumatic, no cyanosis or edema Neurologic: Alert and oriented X 3, normal strength and tone. Normal symmetric reflexes. Normal coordination and gait  ECOG PERFORMANCE STATUS: 0 - Asymptomatic  Blood pressure  134/92, pulse 94, temperature 98.4 F (36.9 C), temperature source Oral, resp. rate 18, height _0  (1.88 m), weight 218 lb 14.4 oz (99.292 kg), SpO2 98.00%.  LABORATORY DATA: Lab Results  Component Value Date   WBC 9.0 03/26/2014   HGB 15.4 03/26/2014   HCT 44.7 03/26/2014   MCV 86.8 03/26/2014   PLT 217 03/26/2014      Chemistry      Component Value Date/Time   NA 140 03/26/2014 1059   K 4.4 03/26/2014 1059   CO2 25 03/26/2014 1059   BUN 12.0 03/26/2014 1059   CREATININE 1.0 03/26/2014 1059      Component Value Date/Time   CALCIUM 10.2 03/26/2014 1059   ALKPHOS 754* 03/26/2014 1059   AST 46* 03/26/2014 1059   ALT 33 03/26/2014 1059   BILITOT 0.70 03/26/2014 1059       RADIOGRAPHIC STUDIES:  Mr [name] Contrast  03/26/2014   CLINICAL DATA:  48 year old male with new diagnosis of bronchogenic carcinoma. Metastatic disease on PET-CT. Staging. Subsequent encounter.  EXAM: MRI HEAD WITHOUT AND WITH CONTRAST  TECHNIQUE: Multiplanar, multiecho pulse sequences of the brain and surrounding structures were obtained without and with intravenous contrast.  CONTRAST:  56m MULTIHANCE GADOBENATE DIMEGLUMINE 529 MG/ML IV SOLN  COMPARISON:  None.  FINDINGS: Numerous small enhancing brain metastases. Greater than 50 individual small metastases are identified. These range from punctate to 6 mm in size (some of the largest lesions are in the right inferior temporal lobe on series 13, image 8, left tail of the hippocampus on series 11, image 22, and left cerebellum on series 11, image 13). Despite the numerous lesions there is minimal cerebral edema, and no associated mass effect.  No dural thickening or hyper enhancement identified. No ventriculomegaly. No acute intracranial hemorrhage identified. No restricted diffusion or evidence of acute infarction. Negative pituitary and cervicomedullary junction. Grossly negative visualized upper cervical spinal cord.  Metastatic bone disease in the clivus (series 3,  image 14) and possibly also scattered in the upper cervical vertebrae (central C2 vertebra on series 10, image 13). No definite calvarium bone metastasis.  Major intracranial vascular flow voids are preserved. Visible internal auditory structures appear normal. Mastoids are clear. Visualized orbit soft tissues are within normal limits. Minor paranasal sinus mucosal thickening. Visualized scalp soft tissues are within normal limits.  IMPRESSION: 1. Too numerous to count small enhancing brain metastases. Lesions range from punctate to 6 mm in size and have minimal associated cerebral edema and no mass effect at this time. 2. Bone metastases in the clivus and upper cervical vertebrae.   Electronically Signed   By: LLars PinksM.D.   On: 03/26/2014 09:31   Nm Pet Image Initial (pi) Skull Base To Thigh  03/25/2014   CLINICAL DATA:  Initial treatment strategy for metastatic non-small-cell lung cancer.  EXAM: NUCLEAR MEDICINE PET SKULL BASE TO THIGH  TECHNIQUE: 11.2 mCi F-18 FDG was injected intravenously. Full-ring PET imaging was performed from the skull base to thigh after the radiotracer. CT data was obtained and used for attenuation correction and anatomic localization.  FASTING BLOOD GLUCOSE:  Value: 99 mg/dl  COMPARISON:  Chest CT 03/14/2014.  FINDINGS: NECK  No hypermetabolic cervical lymph nodes are identified.There are no lesions of the pharyngeal mucosal space.  CHEST  The dominant mass radiating anteriorly from the right upper lobe is hypermetabolic. This demonstrates an SUV max of 9.5. Peripheral components of this may be related to postobstructive pneumonitis. The innumerable pulmonary nodules demonstrated throughout the lungs are suboptimally evaluated based on size, but highly suspicious for metastatic disease as well. These are more numerous on the right, likely in part secondary to lymphangitic spread of tumor. There is no hypermetabolic mediastinal, hilar or axillary nodal activity. There is trace  pleural fluid on the right without abnormal metabolic activity.  ABDOMEN/PELVIS  The hepatic activity is heterogeneous with several focal hypermetabolic lesions consistent with metastatic disease. The most FDG avid lesion is centrally in the right hepatic lobe with an SUV max of 5.8. There is no evidence metastatic disease to the spleen, pancreas or adrenal glands. There is no hypermetabolic nodal activity.  SKELETON  There is widespread hypermetabolic metastatic disease to the bones. Multiple lytic lesions are present within the ribs, sternum, spine, pelvis and proximal femurs. No pathologic fractures or large epidural components are identified.  IMPRESSION: 1. The dominant right upper lobe mass is hypermetabolic consistent with primary bronchogenic carcinoma. 2. There is widespread metastatic disease to the lungs, liver and bones.   Electronically Signed   By: Camie Patience M.D.   On: 03/25/2014 13:39   Dg Chest Port 1 View  03/20/2014   CLINICAL DATA:  Post right upper lobe mass biopsy  EXAM: PORTABLE CHEST - 1 VIEW  COMPARISON:  Chest radiograph March 13, 2014 and chest CT March 14, 2014  FINDINGS: There is no demonstrable pneumothorax. The mass in the right upper lobe is again noted. There is widespread interstitial prominence with multiple tiny nodular lesions throughout the lungs. Question lymphangitic spread of tumor. Heart size and pulmonary vascularity are normal. Adenopathy is not appreciated on this study.  IMPRESSION: No pneumothorax. Persistent right upper lobe mass with questionable lymphangitic spread of tumor diffusely.   Electronically Signed   By: Lowella Grip M.D.   On: 03/20/2014 14:16   Dg C-arm Bronchoscopy  03/20/2014   CLINICAL DATA: bronch procedure   C-ARM BRONCHOSCOPY  Fluoroscopy was utilized by the requesting physician.  No radiographic  interpretation.      ASSESSMENT/PLAN: Patient is a very pleasant 48 year old Caucasian male recently diagnosed with stage IV (T2 A., N0, M1 B.)  non-small cell lung cancer consistent with poorly differentiated adenocarcinoma diagnosed July 2015. Presented with right upper lobe lung mass in addition to extensive liver, brain and bone metastasis. Ocular studies revealed that he was EGFR positive at exon 19. Patient was discussed with also seen by Dr. Julien Nordmann. Side effects of Gilotrif were reviewed with the patient particularly that of skin rash and diarrhea. We will continue to monitor his counts closely. We anticipate that he will receive drug saline and will plan to see him in 2 weeks for another symptom management visit. Patient is in agreement with beginning Gilotrif therapy.     Wynetta Emery, Sefora Tietje E, PA-C  All questions were answered. The patient knows to call the clinic with any problems, questions or concerns. We can certainly see the patient much sooner if necessary.  ADDENDUM:  Hematology/Oncology attending:  I had a face to face encounter with the patient. I recommended his care plan. This is a very pleasant 48 years old white male recently diagnosed with stage IV non-small  cell lung cancer, adenocarcinoma with positive EGFR mutation in exon 19.  The patient has multiple brain metastases and he will start today the first fraction of 4 brain irradiation under the care of Dr. Tammi Klippel. I have a lengthy discussion with the patient today about his current condition spatially with the posterior EGFR mutation. I recommended for the patient treatment with Gilotrif 40 mg by mouth daily. I discussed with the patient adverse effect of this treatment including but not limited to skin rash, diarrhea, interstitial lung disease, liver or renal dysfunction. He would like to proceed with the treatment as planned and his prescription was sent to [facility] for refill. He is expected to start the first dose of this treatment in the next few days. He was given a hand out and information about how to handle the adverse effects of this treatment especially  diarrhea and skin rash. He would come back for followup visit in 2 weeks for reevaluation and management any adverse effect of his treatment. He was advised to call immediately if he has any concerning symptoms in the interval. I would also consider starting the patient on Xgeva for his metastatic bone disease as we received dental clearance from his dentist. He was also advised to take calcium and vitamin D on daily basis.  Disclaimer: This note was dictated with voice recognition software. Similar sounding words can inadvertently be transcribed and may not be corrected upon review. Eilleen Kempf., MD 04/03/2014

## 2014-04-01 NOTE — Progress Notes (Signed)
Called and spoke with [facility].  They stated that medication is ready to send to pt when pt calls them.  I instructed pt to call.

## 2014-04-02 ENCOUNTER — Encounter: Payer: Self-pay | Admitting: *Deleted

## 2014-04-02 ENCOUNTER — Telehealth: Payer: Self-pay | Admitting: *Deleted

## 2014-04-02 ENCOUNTER — Ambulatory Visit
Admission: RE | Admit: 2014-04-02 | Discharge: 2014-04-02 | Disposition: A | Discharge status: Home / Self Care | Payer: BC Managed Care – PPO | Source: Ambulatory Visit | Attending: Radiation Oncology | Admitting: Radiation Oncology

## 2014-04-02 ENCOUNTER — Other Ambulatory Visit: Payer: BC Managed Care – PPO

## 2014-04-02 NOTE — Patient Instructions (Signed)
Take Gilotrif as prescribed Follow up in 2 weeks for a symptom management visit

## 2014-04-02 NOTE — Telephone Encounter (Signed)
Called and left vm message regarding if pt had contacted pharmacy.

## 2014-04-03 ENCOUNTER — Ambulatory Visit
Admission: RE | Admit: 2014-04-03 | Discharge: 2014-04-03 | Disposition: A | Discharge status: Home / Self Care | Payer: BC Managed Care – PPO | Source: Ambulatory Visit | Attending: Radiation Oncology | Admitting: Radiation Oncology

## 2014-04-03 ENCOUNTER — Telehealth: Payer: Self-pay | Admitting: *Deleted

## 2014-04-03 NOTE — Telephone Encounter (Signed)
Received fax from [facility] that pt's Gilotrif rx has been shipped to patient and will be delivered on 04/04/14.  SLJ

## 2014-04-04 ENCOUNTER — Ambulatory Visit
Admission: RE | Admit: 2014-04-04 | Discharge: 2014-04-04 | Disposition: A | Discharge status: Home / Self Care | Payer: BC Managed Care – PPO | Source: Ambulatory Visit | Attending: Radiation Oncology | Admitting: Radiation Oncology

## 2014-04-04 ENCOUNTER — Encounter: Payer: Self-pay | Admitting: *Deleted

## 2014-04-04 NOTE — Progress Notes (Signed)
Called DDS office left message regarding pt may keep dental cleaning appt in Mid August per Dr. Julien Nordmann

## 2014-04-05 ENCOUNTER — Ambulatory Visit
Admission: RE | Admit: 2014-04-05 | Discharge: 2014-04-05 | Disposition: A | Discharge status: Home / Self Care | Payer: BC Managed Care – PPO | Source: Ambulatory Visit | Attending: Radiation Oncology | Admitting: Radiation Oncology

## 2014-04-05 ENCOUNTER — Telehealth: Payer: Self-pay | Admitting: Physician Assistant

## 2014-04-05 VITALS — BP 125/84 | HR 87 | Temp 98.1°F | Resp 12 | Wt 216.1 lb

## 2014-04-05 DIAGNOSIS — C7949 Secondary malignant neoplasm of other parts of nervous system: Principal | ICD-10-CM

## 2014-04-05 DIAGNOSIS — C7931 Secondary malignant neoplasm of brain: Secondary | ICD-10-CM

## 2014-04-05 NOTE — Progress Notes (Signed)
He is currently in no pain. Pt complains of, Fatigue, Generalized Weakness and Poor Appetite.  Pt alert & oriented x 3 with fluent speech, gait normal, reflexes normal and symmetric, experiences general weakness. Pt reports negative for visual blurring, double vision, eye pain. PERRLA and denies ringing in ears. Pt presenting appropriate quality, quantity and organization of sentences. Pt reports dull painful headaches, which have occurred twice this week and are located over bilateral temples, He reports taking ALeve which alleviates the headache. Mouth presenting moist mucous membranes, no exudate noted on tongue.

## 2014-04-05 NOTE — Progress Notes (Signed)
   Department of Radiation Oncology  Phone:  [phone] Fax:        [phone]  Weekly Treatment Note    Name: Oscar Floyd Date: 04/05/2014 MRN: [id number] DOB: 11-Sep-1966   Current dose: 15 Gy  Current fraction: 5   MEDICATIONS: Current Outpatient Prescriptions  Medication Sig Dispense Refill  . afatinib dimaleate (GILOTRIF) 40 MG tablet Take 40 mg by mouth daily. Take on an empty stomach 1hr before or 2 hrs after meals.      . ALPRAZolam (XANAX) 1 MG tablet Take 1 mg by mouth daily as needed. Take as needed      . BENZONATATE PO Take by mouth as needed.      . calcium-vitamin D (OSCAL) 250-125 MG-UNIT per tablet Take 1 tablet by mouth daily.      Marland Kitchen esomeprazole (NEXIUM) 20 MG capsule Take by mouth daily.      . naproxen sodium (ANAPROX) 220 MG tablet Take 220 mg by mouth as needed.       No current facility-administered medications for this encounter.     ALLERGIES: Pollen extract   LABORATORY DATA:  Lab Results  Component Value Date   WBC 9.0 03/26/2014   HGB 15.4 03/26/2014   HCT 44.7 03/26/2014   MCV 86.8 03/26/2014   PLT 217 03/26/2014   Lab Results  Component Value Date   NA 140 03/26/2014   K 4.4 03/26/2014   CO2 25 03/26/2014   Lab Results  Component Value Date   ALT 33 03/26/2014   AST 46* 03/26/2014   ALKPHOS 754* 03/26/2014   BILITOT 0.70 03/26/2014     NARRATIVE: Oscar Floyd was seen today for weekly treatment management. The chart was checked and the patient's films were reviewed. The patient states that he is doing well after completing his first week of treatment. He states that he is not on steroids at this time. The patient has had a couple of mild headaches but this is not been a major problem for him. No nausea.  PHYSICAL EXAMINATION: weight is 216 lb 1.6 oz (98.022 kg). His oral temperature is 98.1 F (36.7 C). His blood pressure is 125/84 and his pulse is 87. His respiration is 12 and oxygen saturation is 100%.      the patient's skin shows  minimal radiation effect  ASSESSMENT: The patient is doing satisfactorily with treatment.  PLAN: We will continue with the patient's radiation treatment as planned.

## 2014-04-05 NOTE — Telephone Encounter (Signed)
Per 07/22 POF scheduled pt for injection montly, call pt spk w/wife mailing out new schedule...KJ

## 2014-04-08 ENCOUNTER — Ambulatory Visit (HOSPITAL_BASED_OUTPATIENT_CLINIC_OR_DEPARTMENT_OTHER): Payer: BLUE CROSS/BLUE SHIELD

## 2014-04-08 ENCOUNTER — Ambulatory Visit
Admission: RE | Admit: 2014-04-08 | Discharge: 2014-04-08 | Disposition: A | Discharge status: Home / Self Care | Payer: BC Managed Care – PPO | Source: Ambulatory Visit | Attending: Radiation Oncology | Admitting: Radiation Oncology

## 2014-04-08 ENCOUNTER — Ambulatory Visit: Payer: BC Managed Care – PPO

## 2014-04-08 VITALS — BP 123/73 | HR 85 | Temp 97.6°F

## 2014-04-08 DIAGNOSIS — C7951 Secondary malignant neoplasm of bone: Secondary | ICD-10-CM | POA: Diagnosis not present

## 2014-04-08 DIAGNOSIS — C3400 Malignant neoplasm of unspecified main bronchus: Secondary | ICD-10-CM

## 2014-04-08 DIAGNOSIS — C7952 Secondary malignant neoplasm of bone marrow: Secondary | ICD-10-CM

## 2014-04-08 DIAGNOSIS — C3410 Malignant neoplasm of upper lobe, unspecified bronchus or lung: Secondary | ICD-10-CM | POA: Diagnosis not present

## 2014-04-08 MED ORDER — DENOSUMAB 120 MG/1.7ML ~~LOC~~ SOLN
120.0000 mg | Freq: Once | SUBCUTANEOUS | Status: AC
  Administered 2014-04-08: 120 mg via SUBCUTANEOUS
  Filled 2014-04-08: qty 1.7

## 2014-04-08 NOTE — Progress Notes (Signed)
Patient in for Xgeva injection today. Injection given as ordered. Patient tolerated injection well.

## 2014-04-08 NOTE — Patient Instructions (Signed)
Denosumab injection What is this medicine? DENOSUMAB (den oh sue mab) slows bone breakdown. Prolia is used to treat osteoporosis in women after menopause and in men. Xgeva is used to prevent bone fractures and other bone problems caused by cancer bone metastases. Xgeva is also used to treat giant cell tumor of the bone. This medicine may be used for other purposes; ask your health care provider or pharmacist if you have questions. COMMON BRAND NAME(S): Prolia, XGEVA What should I tell my health care provider before I take this medicine? They need to know if you have any of these conditions: -dental disease -eczema -infection or history of infections -kidney disease or on dialysis -low blood calcium or vitamin D -malabsorption syndrome -scheduled to have surgery or tooth extraction -taking medicine that contains denosumab -thyroid or parathyroid disease -an unusual reaction to denosumab, other medicines, foods, dyes, or preservatives -pregnant or trying to get pregnant -breast-feeding How should I use this medicine? This medicine is for injection under the skin. It is given by a health care professional in a hospital or clinic setting. If you are getting Prolia, a special MedGuide will be given to you by the pharmacist with each prescription and refill. Be sure to read this information carefully each time. For Prolia, talk to your pediatrician regarding the use of this medicine in children. Special care may be needed. For Xgeva, talk to your pediatrician regarding the use of this medicine in children. While this drug may be prescribed for children as young as 13 years for selected conditions, precautions do apply. Overdosage: If you think you've taken too much of this medicine contact a poison control center or emergency room at once. Overdosage: If you think you have taken too much of this medicine contact a poison control center or emergency room at once. NOTE: This medicine is only for  you. Do not share this medicine with others. What if I miss a dose? It is important not to miss your dose. Call your doctor or health care professional if you are unable to keep an appointment. What may interact with this medicine? Do not take this medicine with any of the following medications: -other medicines containing denosumab This medicine may also interact with the following medications: -medicines that suppress the immune system -medicines that treat cancer -steroid medicines like prednisone or cortisone This list may not describe all possible interactions. Give your health care provider a list of all the medicines, herbs, non-prescription drugs, or dietary supplements you use. Also tell them if you smoke, drink alcohol, or use illegal drugs. Some items may interact with your medicine. What should I watch for while using this medicine? Visit your doctor or health care professional for regular checks on your progress. Your doctor or health care professional may order blood tests and other tests to see how you are doing. Call your doctor or health care professional if you get a cold or other infection while receiving this medicine. Do not treat yourself. This medicine may decrease your body's ability to fight infection. You should make sure you get enough calcium and vitamin D while you are taking this medicine, unless your doctor tells you not to. Discuss the foods you eat and the vitamins you take with your health care professional. See your dentist regularly. Brush and floss your teeth as directed. Before you have any dental work done, tell your dentist you are receiving this medicine. Do not become pregnant while taking this medicine or for 5 months after stopping   it. Women should inform their doctor if they wish to become pregnant or think they might be pregnant. There is a potential for serious side effects to an unborn child. Talk to your health care professional or pharmacist for more  information. What side effects may I notice from receiving this medicine? Side effects that you should report to your doctor or health care professional as soon as possible: -allergic reactions like skin rash, itching or hives, swelling of the face, lips, or tongue -breathing problems -chest pain -fast, irregular heartbeat -feeling faint or lightheaded, falls -fever, chills, or any other sign of infection -muscle spasms, tightening, or twitches -numbness or tingling -skin blisters or bumps, or is dry, peels, or red -slow healing or unexplained pain in the mouth or jaw -unusual bleeding or bruising Side effects that usually do not require medical attention (Report these to your doctor or health care professional if they continue or are bothersome.): -muscle pain -stomach upset, gas This list may not describe all possible side effects. Call your doctor for medical advice about side effects. You may report side effects to FDA at 1-800-FDA-1088. Where should I keep my medicine? This medicine is only given in a clinic, doctor's office, or other health care setting and will not be stored at home. NOTE: This sheet is a summary. It may not cover all possible information. If you have questions about this medicine, talk to your doctor, pharmacist, or health care provider.  2015, Elsevier/Gold Standard. (2012-02-28 12:37:47)  

## 2014-04-09 ENCOUNTER — Ambulatory Visit
Admission: RE | Admit: 2014-04-09 | Discharge: 2014-04-09 | Disposition: A | Discharge status: Home / Self Care | Payer: BC Managed Care – PPO | Source: Ambulatory Visit | Attending: Radiation Oncology | Admitting: Radiation Oncology

## 2014-04-10 ENCOUNTER — Other Ambulatory Visit: Payer: Self-pay | Admitting: *Deleted

## 2014-04-10 ENCOUNTER — Ambulatory Visit (HOSPITAL_BASED_OUTPATIENT_CLINIC_OR_DEPARTMENT_OTHER): Payer: BC Managed Care – PPO

## 2014-04-10 ENCOUNTER — Ambulatory Visit
Admission: RE | Admit: 2014-04-10 | Discharge: 2014-04-10 | Disposition: A | Discharge status: Home / Self Care | Payer: BC Managed Care – PPO | Source: Ambulatory Visit | Attending: Radiation Oncology | Admitting: Radiation Oncology

## 2014-04-10 ENCOUNTER — Telehealth: Payer: Self-pay | Admitting: *Deleted

## 2014-04-10 VITALS — BP 112/70 | HR 69 | Temp 98.2°F

## 2014-04-10 DIAGNOSIS — C3490 Malignant neoplasm of unspecified part of unspecified bronchus or lung: Secondary | ICD-10-CM

## 2014-04-10 DIAGNOSIS — E860 Dehydration: Secondary | ICD-10-CM

## 2014-04-10 DIAGNOSIS — C3410 Malignant neoplasm of upper lobe, unspecified bronchus or lung: Secondary | ICD-10-CM

## 2014-04-10 DIAGNOSIS — C7951 Secondary malignant neoplasm of bone: Secondary | ICD-10-CM

## 2014-04-10 DIAGNOSIS — C7952 Secondary malignant neoplasm of bone marrow: Secondary | ICD-10-CM

## 2014-04-10 DIAGNOSIS — C787 Secondary malignant neoplasm of liver and intrahepatic bile duct: Secondary | ICD-10-CM

## 2014-04-10 MED ORDER — SODIUM CHLORIDE 0.9 % IV SOLN
Freq: Once | INTRAVENOUS | Status: DC
  Administered 2014-04-10: 13:00:00 via INTRAVENOUS

## 2014-04-10 MED ORDER — ONDANSETRON 8 MG/50ML IVPB (CHCC)
8.0000 mg | Freq: Once | INTRAVENOUS | Status: DC
  Administered 2014-04-10: 8 mg via INTRAVENOUS

## 2014-04-10 MED ORDER — PROCHLORPERAZINE MALEATE 10 MG PO TABS: 10.0000 mg | ORAL_TABLET | Freq: Four times a day (QID) | ORAL | Status: DC | PRN

## 2014-04-10 MED ORDER — ONDANSETRON 8 MG/NS 50 ML IVPB
INTRAVENOUS | Status: AC
  Filled 2014-04-10: qty 8

## 2014-04-10 NOTE — Telephone Encounter (Signed)
Called left vm message to call with appt for fluids.

## 2014-04-10 NOTE — Patient Instructions (Signed)
Dehydration, Adult Dehydration is when you lose more fluids from the body than you take in. Vital organs like the kidneys, brain, and heart cannot function without a proper amount of fluids and salt. Any loss of fluids from the body can cause dehydration.  CAUSES   Vomiting.  Diarrhea.  Excessive sweating.  Excessive urine output.  Fever. SYMPTOMS  Mild dehydration  Thirst.  Dry lips.  Slightly dry mouth. Moderate dehydration  Very dry mouth.  Sunken eyes.  Skin does not bounce back quickly when lightly pinched and released.  Dark urine and decreased urine production.  Decreased tear production.  Headache. Severe dehydration  Very dry mouth.  Extreme thirst.  Rapid, weak pulse (more than 100 beats per minute at rest).  Cold hands and feet.  Not able to sweat in spite of heat and temperature.  Rapid breathing.  Blue lips.  Confusion and lethargy.  Difficulty being awakened.  Minimal urine production.  No tears. DIAGNOSIS  Your caregiver will diagnose dehydration based on your symptoms and your exam. Blood and urine tests will help confirm the diagnosis. The diagnostic evaluation should also identify the cause of dehydration. TREATMENT  Treatment of mild or moderate dehydration can often be done at home by increasing the amount of fluids that you drink. It is best to drink small amounts of fluid more often. Drinking too much at one time can make vomiting worse. Refer to the home care instructions below. Severe dehydration needs to be treated at the hospital where you will probably be given intravenous (IV) fluids that contain water and electrolytes. HOME CARE INSTRUCTIONS   Ask your caregiver about specific rehydration instructions.  Drink enough fluids to keep your urine clear or pale yellow.  Drink small amounts frequently if you have nausea and vomiting.  Eat as you normally do.  Avoid:  Foods or drinks high in sugar.  Carbonated  drinks.  Juice.  Extremely hot or cold fluids.  Drinks with caffeine.  Fatty, greasy foods.  Alcohol.  Tobacco.  Overeating.  Gelatin desserts.  Wash your hands well to avoid spreading bacteria and viruses.  Only take over-the-counter or prescription medicines for pain, discomfort, or fever as directed by your caregiver.  Ask your caregiver if you should continue all prescribed and over-the-counter medicines.  Keep all follow-up appointments with your caregiver. SEEK MEDICAL CARE IF:  You have abdominal pain and it increases or stays in one area (localizes).  You have a rash, stiff neck, or severe headache.  You are irritable, sleepy, or difficult to awaken.  You are weak, dizzy, or extremely thirsty. SEEK IMMEDIATE MEDICAL CARE IF:   You are unable to keep fluids down or you get worse despite treatment.  You have frequent episodes of vomiting or diarrhea.  You have blood or green matter (bile) in your vomit.  You have blood in your stool or your stool looks black and tarry.  You have not urinated in 6 to 8 hours, or you have only urinated a small amount of very dark urine.  You have a fever.  You faint. MAKE SURE YOU:   Understand these instructions.  Will watch your condition.  Will get help right away if you are not doing well or get worse. Document Released: 08/30/2005 Document Revised: 11/22/2011 Document Reviewed: 04/19/2011 Exit[employer] Patient Information 2015 [employer]. This information is not intended to replace advice given to you by your health care provider. Make sure you discuss any questions you have with your health care   provider.  

## 2014-04-10 NOTE — Telephone Encounter (Signed)
Called wife with appt for fluids.  She verbalized understanding of appt time, today at 1:00

## 2014-04-11 ENCOUNTER — Ambulatory Visit
Admission: RE | Admit: 2014-04-11 | Discharge: 2014-04-11 | Disposition: A | Discharge status: Home / Self Care | Payer: BC Managed Care – PPO | Source: Ambulatory Visit | Attending: Radiation Oncology | Admitting: Radiation Oncology

## 2014-04-11 ENCOUNTER — Encounter: Payer: Self-pay | Admitting: Radiation Oncology

## 2014-04-11 VITALS — BP 117/82 | HR 83 | Temp 98.1°F | Wt 211.5 lb

## 2014-04-11 DIAGNOSIS — C7949 Secondary malignant neoplasm of other parts of nervous system: Principal | ICD-10-CM

## 2014-04-11 DIAGNOSIS — C7931 Secondary malignant neoplasm of brain: Secondary | ICD-10-CM

## 2014-04-11 MED ORDER — DEXAMETHASONE 4 MG PO TABS: 4.0000 mg | ORAL_TABLET | Freq: Two times a day (BID) | ORAL | Status: DC

## 2014-04-11 MED ORDER — DEXAMETHASONE 4 MG PO TABS: 4.0000 mg | ORAL_TABLET | ORAL | Status: DC

## 2014-04-11 NOTE — Progress Notes (Signed)
  Radiation Oncology         [phone] ________________________________  Name: Oscar Floyd  MRN: [id number]  Date: 04/11/2014  DOB: 15-Feb-1966  Weekly Radiation Therapy Management  Current Dose: 27 Gy     Planned Dose:  30 Gy  Narrative . . . . . . . . The patient presents for routine under treatment assessment.                                   The patient has been suffering with nausea and some vomiting                                 Set-up films were reviewed.                                 The chart was checked. Physical Findings. . .  weight is 211 lb 8 oz (95.936 kg). His temperature is 98.1 F (36.7 C). His blood pressure is 117/82 and his pulse is 83. His oxygen saturation is 98%. . Weight essentially stable.  No significant changes. Impression . . . . . . . The patient is tolerating radiation. His nausea may be the result of cerebral edema. Plan . . . . . . . . . . . . Continue treatment as planned.  Given decadron 4 mg BID today, then 2 mg BID for 2 weeks, then 2 mg once daily for 2 weeks then stop for cerebral edema.  ________________________________  Sheral Apley Tammi Klippel, M.D.

## 2014-04-11 NOTE — Progress Notes (Signed)
Weekly assessment of radiation to brain.Completed 9 of 10 treatments.Denies headache.Atomach in "knots" started on compazine yesterday.Also has ivf on yesterday.Had some n/v over last few days.Orthostatic vitals are fine.No good bowel movement since 7/29.15 am.Had diarrhea on last week.

## 2014-04-12 ENCOUNTER — Encounter: Payer: Self-pay | Admitting: Radiation Oncology

## 2014-04-12 ENCOUNTER — Ambulatory Visit: Payer: BC Managed Care – PPO

## 2014-04-12 ENCOUNTER — Ambulatory Visit: Payer: BC Managed Care – PPO | Admitting: Physician Assistant

## 2014-04-12 ENCOUNTER — Telehealth: Payer: Self-pay | Admitting: *Deleted

## 2014-04-12 ENCOUNTER — Ambulatory Visit
Admission: RE | Admit: 2014-04-12 | Discharge: 2014-04-12 | Disposition: A | Discharge status: Home / Self Care | Payer: BC Managed Care – PPO | Source: Ambulatory Visit | Attending: Radiation Oncology | Admitting: Radiation Oncology

## 2014-04-12 NOTE — Telephone Encounter (Signed)
Pt called c/o having 5 or 6 loose stools starting at 3 am.  He stated he has taken imodum after each stool.  He stated he has only eaten toast with jelly on it.  I informed Dr. Julien Nordmann.  He stated to hold Gilotrif for Sat and Sun and to re-start medication on Monday.  I relayed information to pt and he verbalized understanding of instructions.

## 2014-04-14 NOTE — Progress Notes (Signed)
  Radiation Oncology         [phone]) [phone] ________________________________  Name: Oscar Floyd MRN: [id number]  Date: 04/12/2014  DOB: Mar 25, 1966  End of Treatment Note  Diagnosis:   48 yo man with numerous sub-centimeter brain metastases from cancer of the right upper lobe of the lung   Indication for treatment:  Palliation       Radiation treatment dates:   04/01/2014-04/12/2014  Site/dose:   The whole brain was treated to 30 Gy in 10 fractions of 3 Gy  Beams/energy:   Right and left 6 MV X-ray fields were used to cover the entire brain while excluding the eyes and face maximally.  A molded mask was used for daily positioning and weekly port films were used to verify isocenter.  Narrative: The patient tolerated radiation treatment relatively well.   No acute complications occurred.  He had some headache and nausea treated with a one month taper of low dose dexamethasone.  Plan: The patient has completed radiation treatment. The patient will return to radiation oncology clinic for routine followup in one month. I advised them to call or return sooner if they have any questions or concerns related to their recovery or treatment. ________________________________  Sheral Apley. Tammi Klippel, M.D.

## 2014-04-15 ENCOUNTER — Encounter: Payer: Self-pay | Admitting: Physician Assistant

## 2014-04-15 ENCOUNTER — Other Ambulatory Visit: Payer: BC Managed Care – PPO

## 2014-04-15 ENCOUNTER — Ambulatory Visit (HOSPITAL_BASED_OUTPATIENT_CLINIC_OR_DEPARTMENT_OTHER): Payer: BC Managed Care – PPO | Admitting: Physician Assistant

## 2014-04-15 ENCOUNTER — Ambulatory Visit: Payer: BC Managed Care – PPO | Admitting: Physician Assistant

## 2014-04-15 ENCOUNTER — Telehealth: Payer: Self-pay | Admitting: Internal Medicine

## 2014-04-15 ENCOUNTER — Other Ambulatory Visit (HOSPITAL_BASED_OUTPATIENT_CLINIC_OR_DEPARTMENT_OTHER): Payer: BC Managed Care – PPO

## 2014-04-15 VITALS — BP 114/75 | HR 70 | Temp 98.4°F | Resp 18 | Ht 74.0 in | Wt 207.6 lb

## 2014-04-15 DIAGNOSIS — C787 Secondary malignant neoplasm of liver and intrahepatic bile duct: Secondary | ICD-10-CM | POA: Diagnosis not present

## 2014-04-15 DIAGNOSIS — C3410 Malignant neoplasm of upper lobe, unspecified bronchus or lung: Secondary | ICD-10-CM

## 2014-04-15 DIAGNOSIS — C3430 Malignant neoplasm of lower lobe, unspecified bronchus or lung: Secondary | ICD-10-CM

## 2014-04-15 DIAGNOSIS — C7949 Secondary malignant neoplasm of other parts of nervous system: Secondary | ICD-10-CM

## 2014-04-15 DIAGNOSIS — C3490 Malignant neoplasm of unspecified part of unspecified bronchus or lung: Secondary | ICD-10-CM

## 2014-04-15 DIAGNOSIS — C7951 Secondary malignant neoplasm of bone: Secondary | ICD-10-CM

## 2014-04-15 DIAGNOSIS — C7931 Secondary malignant neoplasm of brain: Secondary | ICD-10-CM

## 2014-04-15 DIAGNOSIS — R197 Diarrhea, unspecified: Secondary | ICD-10-CM

## 2014-04-15 DIAGNOSIS — R634 Abnormal weight loss: Secondary | ICD-10-CM

## 2014-04-15 DIAGNOSIS — C7952 Secondary malignant neoplasm of bone marrow: Secondary | ICD-10-CM

## 2014-04-15 LAB — CBC WITH DIFFERENTIAL/PLATELET
BASO%: 0.4 % (ref 0.0–2.0)
Basophils Absolute: 0 10*3/uL (ref 0.0–0.1)
EOS%: 1.1 % (ref 0.0–7.0)
Eosinophils Absolute: 0.1 10*3/uL (ref 0.0–0.5)
HCT: 47.2 % (ref 38.4–49.9)
HGB: 15.7 g/dL (ref 13.0–17.1)
LYMPH#: 0.6 10*3/uL — AB (ref 0.9–3.3)
LYMPH%: 5.3 % — ABNORMAL LOW (ref 14.0–49.0)
MCH: 29.5 pg (ref 27.2–33.4)
MCHC: 33.2 g/dL (ref 32.0–36.0)
MCV: 88.9 fL (ref 79.3–98.0)
MONO#: 0.8 10*3/uL (ref 0.1–0.9)
MONO%: 7.7 % (ref 0.0–14.0)
NEUT#: 8.9 10*3/uL — ABNORMAL HIGH (ref 1.5–6.5)
NEUT%: 85.5 % — ABNORMAL HIGH (ref 39.0–75.0)
Platelets: 256 10*3/uL (ref 140–400)
RBC: 5.31 10*6/uL (ref 4.20–5.82)
RDW: 13.9 % (ref 11.0–14.6)
WBC: 10.4 10*3/uL — ABNORMAL HIGH (ref 4.0–10.3)

## 2014-04-15 LAB — COMPREHENSIVE METABOLIC PANEL (CC13)
ALBUMIN: 3.5 g/dL (ref 3.5–5.0)
ALT: 31 U/L (ref 0–55)
AST: 19 U/L (ref 5–34)
Alkaline Phosphatase: 641 U/L — ABNORMAL HIGH (ref 40–150)
Anion Gap: 7 mEq/L (ref 3–11)
BUN: 8.7 mg/dL (ref 7.0–26.0)
CALCIUM: 8.4 mg/dL (ref 8.4–10.4)
CHLORIDE: 108 meq/L (ref 98–109)
CO2: 25 mEq/L (ref 22–29)
Creatinine: 0.9 mg/dL (ref 0.7–1.3)
Glucose: 112 mg/dl (ref 70–140)
POTASSIUM: 4 meq/L (ref 3.5–5.1)
SODIUM: 140 meq/L (ref 136–145)
Total Bilirubin: 0.78 mg/dL (ref 0.20–1.20)
Total Protein: 6.5 g/dL (ref 6.4–8.3)

## 2014-04-15 MED ORDER — DIPHENOXYLATE-ATROPINE 2.5-0.025 MG PO TABS: 1.0000 | ORAL_TABLET | Freq: Four times a day (QID) | ORAL | Status: DC | PRN

## 2014-04-15 NOTE — Telephone Encounter (Signed)
gv pt appt schedule for aug

## 2014-04-15 NOTE — Progress Notes (Addendum)
No images are attached to the encounter. No scans are attached to the encounter. No scans are attached to the encounter. [facility] SHARED VISIT PROGRESS NOTE  [name], MD [address] 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    Primary site: Lung (Right)   Staging method: AJCC 7th Edition   Clinical: Stage IV (T2a, N0, M1b) signed by [name], MD on 03/26/2014  4:57 PM   Summary: Stage IV (T2a, N0, M1b)  PRIOR THERAPY: Status post whole brain irradiation under the care Dr. Tammi Klippel completed 04/12/2014  CURRENT THERAPY: Gilotrif 40 mg po daily - therapy beginning 04/03/2014   DISEASE STAGE: Lung cancer   Primary site: Lung (Right)   Staging method: AJCC 7th Edition   Clinical: Stage IV (T2a, N0, M1b) signed by [name], MD on 03/26/2014  4:57 PM   Summary: Stage IV (T2a, N0, M1b)  CHEMOTHERAPY INTENT: palliative  CURRENT # OF CHEMOTHERAPY CYCLES: 0  CURRENT ANTIEMETICS: none  CURRENT SMOKING STATUS: non-smoker/never smoker  ORAL CHEMOTHERAPY AND CONSENT: yes- Gilotrif  CURRENT BISPHOSPHONATES USE: none  PAIN MANAGEMENT: none  NARCOTICS INDUCED CONSTIPATION: none  LIVING WILL AND CODE STATUS:    INTERVAL HISTORY: Oscar Floyd 48 y.o. male returns for a scheduled regular office visit for followup of his recently diagnosed stage IV (T2 A., N0, M1B) non-small cell lung cancer consistent with poorly differentiated adenocarcinoma. The molecular studies reveal his EGFR positive at exon 19. He began therapy with Gfilotrif on 04/03/2014. He's had significant discomfort with diarrhea anywhere from 2-6 episodes daily. He is been taking Imodium intermittently.  He's also had some nausea although this may be related to his recent completion of his whole brain radiation. He is currently on a dexamethasone taper under the care of Dr. Tammi Klippel. Per our instruction he held his Gilotrif over the weekend and reports that he resumed this morning. His by mouth intake has been significantly decreased and his weight is down approximately 11 pounds.   MEDICAL HISTORY: Past Medical History  Diagnosis Date  . Lung cancer     RUL lung with mets to liver, bone and brain  . Hypertension     ALLERGIES:  is allergic to pollen extract.  MEDICATIONS:  Current Outpatient Prescriptions  Medication Sig Dispense Refill  . afatinib dimaleate (GILOTRIF) 40 MG tablet Take 40 mg by mouth daily. Take on an empty stomach 1hr before or 2 hrs after meals.      . ALPRAZolam (XANAX) 1 MG tablet Take 1 mg by mouth daily as needed. Take as needed      . calcium-vitamin D (OSCAL) 250-125 MG-UNIT per tablet Take 1 tablet by mouth daily.      Marland Kitchen dexamethasone (DECADRON) 4 MG tablet Take 1 tablet (4 mg total) by mouth 2 (two) times daily. Full pill twice daily on day one, then half pill twice daily for 2 weeks, then half pill once daily for 2 weeks then stop  30 tablet  2  . esomeprazole (NEXIUM) 20 MG capsule Take by mouth daily.      . naproxen sodium (ANAPROX) 220 MG tablet Take 220 mg by mouth as needed.      . prochlorperazine (COMPAZINE) 10 MG tablet Take 1  tablet (10 mg total) by mouth every 6 (six) hours as needed for nausea or vomiting.  30 tablet  1  . BENZONATATE PO Take by mouth as needed.      . diphenoxylate-atropine (LOMOTIL) 2.5-0.025 MG per tablet Take 1 tablet by mouth every 6 (six) hours as needed for diarrhea or loose stools.  30 tablet  2   No current facility-administered medications for this visit.    SURGICAL HISTORY:  Past Surgical History  Procedure Laterality Date  . Vasectomy    . Video bronchoscopy Bilateral 03/20/2014    Procedure: VIDEO BRONCHOSCOPY WITH  FLUORO;  Surgeon: Rigoberto Noel, MD;  Location: [facility];  Service: Cardiopulmonary;  Laterality: Bilateral;    REVIEW OF SYSTEMS:  A comprehensive review of systems was negative.   PHYSICAL EXAMINATION: General appearance: alert, cooperative, appears stated age and no distress Head: Normocephalic, without obvious abnormality, atraumatic Neck: no adenopathy, no carotid bruit, no JVD, supple, symmetrical, trachea midline and thyroid not enlarged, symmetric, no tenderness/mass/nodules Lymph nodes: Cervical, supraclavicular, and axillary nodes normal. Resp: clear to auscultation bilaterally Back: symmetric, no curvature. ROM normal. No CVA tenderness. Cardio: regular rate and rhythm, S1, S2 normal, no murmur, click, rub or gallop GI: soft, non-tender; bowel sounds normal; no masses,  no organomegaly Extremities: extremities normal, atraumatic, no cyanosis or edema Neurologic: Alert and oriented X 3, normal strength and tone. Normal symmetric reflexes. Normal coordination and gait  ECOG PERFORMANCE STATUS: 0 - Asymptomatic  Blood pressure 114/75, pulse 70, temperature 98.4 F (36.9 C), temperature source Oral, resp. rate 18, height 6' 2"  (1.88 m), weight 207 lb 9.6 oz (94.167 kg), SpO2 99.00%.  LABORATORY DATA: Lab Results  Component Value Date   WBC 10.4* 04/15/2014   HGB 15.7 04/15/2014   HCT 47.2 04/15/2014   MCV 88.9 04/15/2014   PLT 256 04/15/2014      Chemistry      Component Value Date/Time   NA 140 04/15/2014 1416   K 4.0 04/15/2014 1416   CO2 25 04/15/2014 1416   BUN 8.7 04/15/2014 1416   CREATININE 0.9 04/15/2014 1416      Component Value Date/Time   CALCIUM 8.4 04/15/2014 1416   ALKPHOS 641* 04/15/2014 1416   AST 19 04/15/2014 1416   ALT 31 04/15/2014 1416   BILITOT 0.78 04/15/2014 1416       RADIOGRAPHIC STUDIES:  Mr [name] Contrast  03/26/2014   CLINICAL DATA:  48 year old male with new diagnosis of bronchogenic carcinoma. Metastatic disease on PET-CT. Staging. Subsequent  encounter.  EXAM: MRI HEAD WITHOUT AND WITH CONTRAST  TECHNIQUE: Multiplanar, multiecho pulse sequences of the brain and surrounding structures were obtained without and with intravenous contrast.  CONTRAST:  72m MULTIHANCE GADOBENATE DIMEGLUMINE 529 MG/ML IV SOLN  COMPARISON:  None.  FINDINGS: Numerous small enhancing brain metastases. Greater than 50 individual small metastases are identified. These range from punctate to 6 mm in size (some of the largest lesions are in the right inferior temporal lobe on series 13, image 8, left tail of the hippocampus on series 11, image 22, and left cerebellum on series 11, image 13). Despite the numerous lesions there is minimal cerebral edema, and no associated mass effect.  No dural thickening or hyper enhancement identified. No ventriculomegaly. No acute intracranial hemorrhage identified. No restricted diffusion or evidence of acute infarction. Negative pituitary and cervicomedullary junction. Grossly negative visualized upper cervical spinal cord.  Metastatic bone disease in the clivus (series 3, image 14) and possibly  also scattered in the upper cervical vertebrae (central C2 vertebra on series 10, image 13). No definite calvarium bone metastasis.  Major intracranial vascular flow voids are preserved. Visible internal auditory structures appear normal. Mastoids are clear. Visualized orbit soft tissues are within normal limits. Minor paranasal sinus mucosal thickening. Visualized scalp soft tissues are within normal limits.  IMPRESSION: 1. Too numerous to count small enhancing brain metastases. Lesions range from punctate to 6 mm in size and have minimal associated cerebral edema and no mass effect at this time. 2. Bone metastases in the clivus and upper cervical vertebrae.   Electronically Signed   By: Lars Pinks M.D.   On: 03/26/2014 09:31   Nm Pet Image Initial (pi) Skull Base To Thigh  03/25/2014   CLINICAL DATA:  Initial treatment strategy for metastatic  non-small-cell lung cancer.  EXAM: NUCLEAR MEDICINE PET SKULL BASE TO THIGH  TECHNIQUE: 11.2 mCi F-18 FDG was injected intravenously. Full-ring PET imaging was performed from the skull base to thigh after the radiotracer. CT data was obtained and used for attenuation correction and anatomic localization.  FASTING BLOOD GLUCOSE:  Value: 99 mg/dl  COMPARISON:  Chest CT 03/14/2014.  FINDINGS: NECK  No hypermetabolic cervical lymph nodes are identified.There are no lesions of the pharyngeal mucosal space.  CHEST  The dominant mass radiating anteriorly from the right upper lobe is hypermetabolic. This demonstrates an SUV max of 9.5. Peripheral components of this may be related to postobstructive pneumonitis. The innumerable pulmonary nodules demonstrated throughout the lungs are suboptimally evaluated based on size, but highly suspicious for metastatic disease as well. These are more numerous on the right, likely in part secondary to lymphangitic spread of tumor. There is no hypermetabolic mediastinal, hilar or axillary nodal activity. There is trace pleural fluid on the right without abnormal metabolic activity.  ABDOMEN/PELVIS  The hepatic activity is heterogeneous with several focal hypermetabolic lesions consistent with metastatic disease. The most FDG avid lesion is centrally in the right hepatic lobe with an SUV max of 5.8. There is no evidence metastatic disease to the spleen, pancreas or adrenal glands. There is no hypermetabolic nodal activity.  SKELETON  There is widespread hypermetabolic metastatic disease to the bones. Multiple lytic lesions are present within the ribs, sternum, spine, pelvis and proximal femurs. No pathologic fractures or large epidural components are identified.  IMPRESSION: 1. The dominant right upper lobe mass is hypermetabolic consistent with primary bronchogenic carcinoma. 2. There is widespread metastatic disease to the lungs, liver and bones.   Electronically Signed   By: Camie Patience  M.D.   On: 03/25/2014 13:39   Dg Chest Port 1 View  03/20/2014   CLINICAL DATA:  Post right upper lobe mass biopsy  EXAM: PORTABLE CHEST - 1 VIEW  COMPARISON:  Chest radiograph March 13, 2014 and chest CT March 14, 2014  FINDINGS: There is no demonstrable pneumothorax. The mass in the right upper lobe is again noted. There is widespread interstitial prominence with multiple tiny nodular lesions throughout the lungs. Question lymphangitic spread of tumor. Heart size and pulmonary vascularity are normal. Adenopathy is not appreciated on this study.  IMPRESSION: No pneumothorax. Persistent right upper lobe mass with questionable lymphangitic spread of tumor diffusely.   Electronically Signed   By: Lowella Grip M.D.   On: 03/20/2014 14:16   Dg C-arm Bronchoscopy  03/20/2014   CLINICAL DATA: bronch procedure   C-ARM BRONCHOSCOPY  Fluoroscopy was utilized by the requesting physician.  No radiographic  interpretation.  ASSESSMENT/PLAN: Patient is a very pleasant 48 year old Caucasian male recently diagnosed with stage IV (T2 A., N0, M1 B.) non-small cell lung cancer consistent with poorly differentiated adenocarcinoma diagnosed July 2015. Presented with right upper lobe lung mass in addition to extensive liver, brain and bone metastasis. Molecular studies revealed that he was EGFR positive at exon 19. Patient was discussed with also seen by Dr. Julien Nordmann. He has some diarrhea related to the Gilotrif. We reviewed the instructions for taking Imodium and recommended that he take 1-2 tablets every morning as a precaution. In addition he was given a prescription for Lomotil with instructions take this in relationship to his diarrhea. If these measures do not stable off his diarrhea we will consider decreasing the dose of Gilotrif. He will return in 2 weeks for another symptom management visit. I will refer him to our dietitian at that time also regarding his weight loss.   All questions were answered. The patient  knows to call the clinic with any problems, questions or concerns. We can certainly see the patient much sooner if necessary.  Disclaimer: This note was dictated with voice recognition software. Similar sounding words can inadvertently be transcribed and may not be corrected upon review. Carlton Adam, PA-C 04/15/2014  ADDENDUM: Hematology/Oncology Attending: I had a face to face encounter with the patient. I recommended his care plan. This is a very pleasant 48 years old white male recently diagnosed with [name], adenocarcinoma with EGFR mutation in exon 19. He is currently undergoing treatment with Gilotrif 40 mg by mouth daily and tolerating his treatment well except for several episodes of diarrhea and we had to hold his Gilotrif dose during the weekend. His diarrhea is better today. I recommended for the patient to take Imodium after every episodes of diarrhea to the maximum of 16 mg by mouth daily. He was also given prescription for Lomotil as needed. He resumed his treatment with Gilotrif earlier today. We will continue to monitor him closely with followup visit in 2 weeks for reevaluation. He was advised to call immediately if he has any concerning symptoms in the interval.  Disclaimer: This note was dictated with voice recognition software. Similar sounding words can inadvertently be transcribed and may be missed upon review. Eilleen Kempf., MD 04/18/2014

## 2014-04-16 LAB — FUNGUS CULTURE W SMEAR: FUNGAL SMEAR: NONE SEEN

## 2014-04-17 NOTE — Patient Instructions (Signed)
Take the Imodium and Lomotil as instructed Continue to take your Gilotrif as prescribed Followup in 2 weeks Increase your by mouth intake of food and fluids as discussed

## 2014-04-29 ENCOUNTER — Ambulatory Visit (HOSPITAL_BASED_OUTPATIENT_CLINIC_OR_DEPARTMENT_OTHER): Payer: BC Managed Care – PPO | Admitting: Physician Assistant

## 2014-04-29 ENCOUNTER — Other Ambulatory Visit (HOSPITAL_BASED_OUTPATIENT_CLINIC_OR_DEPARTMENT_OTHER): Payer: BC Managed Care – PPO

## 2014-04-29 ENCOUNTER — Telehealth: Payer: Self-pay | Admitting: Internal Medicine

## 2014-04-29 ENCOUNTER — Encounter: Payer: Self-pay | Admitting: Physician Assistant

## 2014-04-29 ENCOUNTER — Ambulatory Visit: Payer: BC Managed Care – PPO | Admitting: Nutrition

## 2014-04-29 VITALS — BP 120/81 | HR 75 | Temp 98.3°F | Resp 19 | Ht 74.0 in | Wt 204.3 lb

## 2014-04-29 DIAGNOSIS — C3490 Malignant neoplasm of unspecified part of unspecified bronchus or lung: Secondary | ICD-10-CM

## 2014-04-29 DIAGNOSIS — C7951 Secondary malignant neoplasm of bone: Secondary | ICD-10-CM

## 2014-04-29 DIAGNOSIS — C3430 Malignant neoplasm of lower lobe, unspecified bronchus or lung: Secondary | ICD-10-CM

## 2014-04-29 DIAGNOSIS — C787 Secondary malignant neoplasm of liver and intrahepatic bile duct: Secondary | ICD-10-CM

## 2014-04-29 DIAGNOSIS — C3410 Malignant neoplasm of upper lobe, unspecified bronchus or lung: Secondary | ICD-10-CM

## 2014-04-29 DIAGNOSIS — C7952 Secondary malignant neoplasm of bone marrow: Secondary | ICD-10-CM

## 2014-04-29 DIAGNOSIS — C7931 Secondary malignant neoplasm of brain: Secondary | ICD-10-CM

## 2014-04-29 DIAGNOSIS — C7949 Secondary malignant neoplasm of other parts of nervous system: Secondary | ICD-10-CM

## 2014-04-29 DIAGNOSIS — R21 Rash and other nonspecific skin eruption: Secondary | ICD-10-CM

## 2014-04-29 LAB — COMPREHENSIVE METABOLIC PANEL (CC13)
ALBUMIN: 3.4 g/dL — AB (ref 3.5–5.0)
ALK PHOS: 338 U/L — AB (ref 40–150)
ALT: 42 U/L (ref 0–55)
AST: 22 U/L (ref 5–34)
Anion Gap: 8 mEq/L (ref 3–11)
BUN: 13.7 mg/dL (ref 7.0–26.0)
CALCIUM: 7.7 mg/dL — AB (ref 8.4–10.4)
CHLORIDE: 108 meq/L (ref 98–109)
CO2: 22 mEq/L (ref 22–29)
CREATININE: 0.8 mg/dL (ref 0.7–1.3)
Glucose: 95 mg/dl (ref 70–140)
POTASSIUM: 4.3 meq/L (ref 3.5–5.1)
Sodium: 138 mEq/L (ref 136–145)
Total Bilirubin: 1.1 mg/dL (ref 0.20–1.20)
Total Protein: 6.4 g/dL (ref 6.4–8.3)

## 2014-04-29 LAB — CBC WITH DIFFERENTIAL/PLATELET
BASO%: 0.3 % (ref 0.0–2.0)
BASOS ABS: 0 10*3/uL (ref 0.0–0.1)
EOS%: 0.4 % (ref 0.0–7.0)
Eosinophils Absolute: 0 10*3/uL (ref 0.0–0.5)
HCT: 47.3 % (ref 38.4–49.9)
HGB: 15.7 g/dL (ref 13.0–17.1)
LYMPH#: 0.8 10*3/uL — AB (ref 0.9–3.3)
LYMPH%: 8.2 % — ABNORMAL LOW (ref 14.0–49.0)
MCH: 29.8 pg (ref 27.2–33.4)
MCHC: 33.1 g/dL (ref 32.0–36.0)
MCV: 89.8 fL (ref 79.3–98.0)
MONO#: 0.5 10*3/uL (ref 0.1–0.9)
MONO%: 4.9 % (ref 0.0–14.0)
NEUT#: 8.3 10*3/uL — ABNORMAL HIGH (ref 1.5–6.5)
NEUT%: 86.2 % — ABNORMAL HIGH (ref 39.0–75.0)
Platelets: 217 10*3/uL (ref 140–400)
RBC: 5.26 10*6/uL (ref 4.20–5.82)
RDW: 14.8 % — AB (ref 11.0–14.6)
WBC: 9.6 10*3/uL (ref 4.0–10.3)

## 2014-04-29 NOTE — Progress Notes (Signed)
48 year old male diagnosed with [name] receiving palliative chemoradiation therapy. (s/p whole brain radiation)  Past medical history includes hypertension.  Medications include Gilotrif, Xanax, Os-Cal, Decadron, Lomotil, Nexium, and Compazine.  Labs were reviewed.  Height: 6 feet 2 inches. Weight: 204.6 pounds August 17. Usual body weight 220 pounds. BMI: 26.27.  Patient reports nausea and diarrhea have improved after completion of radiation therapy.  Patient reports increased oral intake but has had continued weight loss.  Patient expresses severe taste alterations limit his ability to eat protein foods.  He is concerned regarding increased sugar-containing foods and dairy foods.  Nutrition diagnosis: Unintended weight loss related to new diagnosis of stage IV lung cancer and associated treatments as evidenced by 7% weight loss from usual body weight.  Intervention:  Patient educated to increase calories and protein in 3 meals and 3 snacks.   Reviewed high protein foods sources.  I educated patient on strategies for improving taste. Reviewed/educated patient on concerns with dairy foods and sugar containing foods.   Recommended patient try Carnation breakfast as well as protein bars.   Provided fact sheets and recipes.   Questions were answered.  Teach back method used. Contact information given.  Monitoring, evaluation, goals: Patient will tolerate increased calories and protein to minimize further weight loss.  Next visit: Patient will contact me for questions or concerns.   **Disclaimer: This note was dictated with voice recognition software. Similar sounding words can inadvertently be transcribed and this note may contain transcription errors which may not have been corrected upon publication of note.**

## 2014-04-29 NOTE — Progress Notes (Addendum)
No images are attached to the encounter. No scans are attached to the encounter. No scans are attached to the encounter. [facility] SHARED VISIT PROGRESS NOTE  [name], MD [address] [place] [address]  DIAGNOSIS: stage IV (T2a, N0, M1b) non-small cell lung cancer consistent with poorly differentiated adenocarcinoma with positive EGFR mutation with deletion in exon 19, diagnosed in July of 2015 and presented with right upper lobe lung mass in addition to extensive liver, brain and bone metastases.    Primary site: Lung (Right)   Staging method: AJCC 7th Edition   Clinical: Stage IV (T2a, N0, M1b) signed by [name], MD on 03/26/2014  4:57 PM   Summary: Stage IV (T2a, N0, M1b)  PRIOR THERAPY: Status post whole brain irradiation under the care Dr. Tammi Klippel completed 04/12/2014  CURRENT THERAPY: 1) Gilotrif 40 mg po daily - therapy beginning 04/03/2014. Status post approximately 3 weeks of therapy 2) Xgeva 120 mcg subcutaneously on monthly basis.  DISEASE STAGE: Lung cancer   Primary site: Lung (Right)   Staging method: AJCC 7th Edition   Clinical: Stage IV (T2a, N0, M1b) signed by [name], MD on 03/26/2014  4:57 PM   Summary: Stage IV (T2a, N0, M1b)  CHEMOTHERAPY INTENT: palliative  CURRENT # OF CHEMOTHERAPY CYCLES: 1  CURRENT ANTIEMETICS: none  CURRENT SMOKING STATUS: non-smoker/never smoker  ORAL CHEMOTHERAPY AND CONSENT: yes- Gilotrif  CURRENT BISPHOSPHONATES USE: none  PAIN MANAGEMENT: none  NARCOTICS INDUCED CONSTIPATION: none  LIVING WILL AND CODE STATUS:    INTERVAL HISTORY: Oscar Floyd 48 y.o. male returns for a symptom management visit for followup of his recently diagnosed stage IV (T2 A., N0, M1B) non-small cell lung cancer consistent with poorly differentiated adenocarcinoma. The molecular studies reveal his EGFR positive at exon 19. He began therapy with Gfilotrif on 04/03/2014. He's had significant discomfort  with diarrhea anywhere from 2-6 episodes daily. At his last visit we recommended that he take 1-2 tablets of Imodium first thing in the morning on a daily basis to see if this would help with his diarrhea. We also gave him a prescription for Lomotil in the event that it was necessary to add a second agent. He reports that taking 2 tablets of Imodium first thing in the morning has normalized his bowel movements. He has developed a mild rash on the face and anterior chest but does not find it problematic. He is been taking Imodium intermittently. His by mouth intake has improved however he has lost an additional approximately 3 and half pounds since being seen in our office on 04/15/2014.   MEDICAL HISTORY: Past Medical History  Diagnosis Date  . Lung cancer     RUL lung with mets to liver, bone and brain  . Hypertension     ALLERGIES:  is allergic to pollen extract.  MEDICATIONS:  Current Outpatient Prescriptions  Medication Sig Dispense Refill  . afatinib dimaleate (GILOTRIF) 40 MG tablet Take 40 mg by mouth daily. Take on an empty stomach 1hr before or 2 hrs after meals.      Marland Kitchen BENZONATATE PO Take by mouth as needed.      . calcium-vitamin D (OSCAL) 250-125 MG-UNIT per tablet Take 1 tablet by mouth daily.      Marland Kitchen dexamethasone (DECADRON) 4 MG tablet Take 1 tablet (4 mg total) by mouth 2 (two) times daily. Full pill twice daily on day one, then half pill twice daily for 2 weeks, then half pill once daily for  2 weeks then stop  30 tablet  2  . esomeprazole (NEXIUM) 20 MG capsule Take by mouth daily.      . naproxen sodium (ANAPROX) 220 MG tablet Take 220 mg by mouth as needed.      . zolpidem (AMBIEN CR) 12.5 MG CR tablet Take 12.5 mg by mouth.      . ALPRAZolam (XANAX) 1 MG tablet Take 1 mg by mouth daily as needed. Take as needed      . diphenoxylate-atropine (LOMOTIL) 2.5-0.025 MG per tablet Take 1 tablet by mouth every 6 (six) hours as needed for diarrhea or loose stools.  30 tablet  2  .  prochlorperazine (COMPAZINE) 10 MG tablet Take 1 tablet (10 mg total) by mouth every 6 (six) hours as needed for nausea or vomiting.  30 tablet  1   No current facility-administered medications for this visit.    SURGICAL HISTORY:  Past Surgical History  Procedure Laterality Date  . Vasectomy    . Video bronchoscopy Bilateral 03/20/2014    Procedure: VIDEO BRONCHOSCOPY WITH FLUORO;  Surgeon: Rigoberto Noel, MD;  Location: [facility];  Service: Cardiopulmonary;  Laterality: Bilateral;    REVIEW OF SYSTEMS:  A comprehensive review of systems was negative except for: Gastrointestinal: positive for diarrhea and Significantly improved since he started taking 2 Imodium tablets every morning.   PHYSICAL EXAMINATION: General appearance: alert, cooperative, appears stated age and no distress Head: Normocephalic, without obvious abnormality, atraumatic Neck: no adenopathy, no carotid bruit, no JVD, supple, symmetrical, trachea midline and thyroid not enlarged, symmetric, no tenderness/mass/nodules Lymph nodes: Cervical, supraclavicular, and axillary nodes normal. Resp: clear to auscultation bilaterally Back: symmetric, no curvature. ROM normal. No CVA tenderness. Cardio: regular rate and rhythm, S1, S2 normal, no murmur, click, rub or gallop GI: soft, non-tender; bowel sounds normal; no masses,  no organomegaly Extremities: extremities normal, atraumatic, no cyanosis or edema Neurologic: Alert and oriented X 3, normal strength and tone. Normal symmetric reflexes. Normal coordination and gait Skin: Mild acneform eruptions affecting the face of primarily the nose and cheeks as well as anterior chest. No evidence of superinfection.  ECOG PERFORMANCE STATUS: 0 - Asymptomatic  Blood pressure 120/81, pulse 75, temperature 98.3 F (36.8 C), temperature source Oral, resp. rate 19, height 6' 2"  (1.88 m), weight 204 lb 4.8 oz (92.67 kg), SpO2 100.00%.  LABORATORY DATA: Lab Results  Component Value Date    WBC 9.6 04/29/2014   HGB 15.7 04/29/2014   HCT 47.3 04/29/2014   MCV 89.8 04/29/2014   PLT 217 04/29/2014      Chemistry      Component Value Date/Time   NA 138 04/29/2014 1318   K 4.3 04/29/2014 1318   CO2 22 04/29/2014 1318   BUN 13.7 04/29/2014 1318   CREATININE 0.8 04/29/2014 1318      Component Value Date/Time   CALCIUM 7.7* 04/29/2014 1318   ALKPHOS 338* 04/29/2014 1318   AST 22 04/29/2014 1318   ALT 42 04/29/2014 1318   BILITOT 1.10 04/29/2014 1318       RADIOGRAPHIC STUDIES:  Mr [name] Wo Contrast  03/26/2014   CLINICAL DATA:  48 year old male with new diagnosis of bronchogenic carcinoma. Metastatic disease on PET-CT. Staging. Subsequent encounter.  EXAM: MRI HEAD WITHOUT AND WITH CONTRAST  TECHNIQUE: Multiplanar, multiecho pulse sequences of the brain and surrounding structures were obtained without and with intravenous contrast.  CONTRAST:  72m MULTIHANCE GADOBENATE DIMEGLUMINE 529 MG/ML IV SOLN  COMPARISON:  None.  FINDINGS:  Numerous small enhancing brain metastases. Greater than 50 individual small metastases are identified. These range from punctate to 6 mm in size (some of the largest lesions are in the right inferior temporal lobe on series 13, image 8, left tail of the hippocampus on series 11, image 22, and left cerebellum on series 11, image 13). Despite the numerous lesions there is minimal cerebral edema, and no associated mass effect.  No dural thickening or hyper enhancement identified. No ventriculomegaly. No acute intracranial hemorrhage identified. No restricted diffusion or evidence of acute infarction. Negative pituitary and cervicomedullary junction. Grossly negative visualized upper cervical spinal cord.  Metastatic bone disease in the clivus (series 3, image 14) and possibly also scattered in the upper cervical vertebrae (central C2 vertebra on series 10, image 13). No definite calvarium bone metastasis.  Major intracranial vascular flow voids are preserved. Visible  internal auditory structures appear normal. Mastoids are clear. Visualized orbit soft tissues are within normal limits. Minor paranasal sinus mucosal thickening. Visualized scalp soft tissues are within normal limits.  IMPRESSION: 1. Too numerous to count small enhancing brain metastases. Lesions range from punctate to 6 mm in size and have minimal associated cerebral edema and no mass effect at this time. 2. Bone metastases in the clivus and upper cervical vertebrae.   Electronically Signed   By: Lars Pinks M.D.   On: 03/26/2014 09:31   Nm Pet Image Initial (pi) Skull Base To Thigh  03/25/2014   CLINICAL DATA:  Initial treatment strategy for metastatic non-small-cell lung cancer.  EXAM: NUCLEAR MEDICINE PET SKULL BASE TO THIGH  TECHNIQUE: 11.2 mCi F-18 FDG was injected intravenously. Full-ring PET imaging was performed from the skull base to thigh after the radiotracer. CT data was obtained and used for attenuation correction and anatomic localization.  FASTING BLOOD GLUCOSE:  Value: 99 mg/dl  COMPARISON:  Chest CT 03/14/2014.  FINDINGS: NECK  No hypermetabolic cervical lymph nodes are identified.There are no lesions of the pharyngeal mucosal space.  CHEST  The dominant mass radiating anteriorly from the right upper lobe is hypermetabolic. This demonstrates an SUV max of 9.5. Peripheral components of this may be related to postobstructive pneumonitis. The innumerable pulmonary nodules demonstrated throughout the lungs are suboptimally evaluated based on size, but highly suspicious for metastatic disease as well. These are more numerous on the right, likely in part secondary to lymphangitic spread of tumor. There is no hypermetabolic mediastinal, hilar or axillary nodal activity. There is trace pleural fluid on the right without abnormal metabolic activity.  ABDOMEN/PELVIS  The hepatic activity is heterogeneous with several focal hypermetabolic lesions consistent with metastatic disease. The most FDG avid lesion  is centrally in the right hepatic lobe with an SUV max of 5.8. There is no evidence metastatic disease to the spleen, pancreas or adrenal glands. There is no hypermetabolic nodal activity.  SKELETON  There is widespread hypermetabolic metastatic disease to the bones. Multiple lytic lesions are present within the ribs, sternum, spine, pelvis and proximal femurs. No pathologic fractures or large epidural components are identified.  IMPRESSION: 1. The dominant right upper lobe mass is hypermetabolic consistent with primary bronchogenic carcinoma. 2. There is widespread metastatic disease to the lungs, liver and bones.   Electronically Signed   By: Camie Patience M.D.   On: 03/25/2014 13:39   Dg Chest Port 1 View  03/20/2014   CLINICAL DATA:  Post right upper lobe mass biopsy  EXAM: PORTABLE CHEST - 1 VIEW  COMPARISON:  Chest radiograph March 13, 2014 and chest CT March 14, 2014  FINDINGS: There is no demonstrable pneumothorax. The mass in the right upper lobe is again noted. There is widespread interstitial prominence with multiple tiny nodular lesions throughout the lungs. Question lymphangitic spread of tumor. Heart size and pulmonary vascularity are normal. Adenopathy is not appreciated on this study.  IMPRESSION: No pneumothorax. Persistent right upper lobe mass with questionable lymphangitic spread of tumor diffusely.   Electronically Signed   By: Lowella Grip M.D.   On: 03/20/2014 14:16   Dg C-arm Bronchoscopy  03/20/2014   CLINICAL DATA: bronch procedure   C-ARM BRONCHOSCOPY  Fluoroscopy was utilized by the requesting physician.  No radiographic  interpretation.      ASSESSMENT/PLAN: Patient is a very pleasant 48 year old Caucasian male recently diagnosed with stage IV (T2 A., N0, M1 B.) non-small cell lung cancer consistent with poorly differentiated adenocarcinoma diagnosed July 2015. Presented with right upper lobe lung mass in addition to extensive liver, brain and bone metastasis. Molecular studies  revealed that he was EGFR positive at exon 19. Patient was discussed with also seen by Dr. Julien Nordmann. He has some diarrhea related to the Gilotrif. This has resolved/normalized since taking 2 Imodium tablets every morning prophylactically. He has developed mild skin rash related to the Gilotrif. He will continue on  Gilotrif at the current dose. He'll return in one month for another symptom management visit with a restaging CT scan of the chest, abdomen and pelvis with contrast to reevaluate his disease. He would like to return to work and we recommended he do this slowly and as tolerated. He had extensive bone metastasis and we will refer him back to Dr. Tammi Klippel for possible radiation therapy to the spine. He does have an upcoming appointment with Dr. Tammi Klippel within the next week.   All questions were answered. The patient knows to call the clinic with any problems, questions or concerns. We can certainly see the patient much sooner if necessary.  Disclaimer: This note was dictated with voice recognition software. Similar sounding words can inadvertently be transcribed and may not be corrected upon review. Carlton Adam, PA-C 04/29/2014  ADDENDUM: Hematology/Oncology Attending: I had a face to face encounter with the patient. I recommended his care plan.This is a very pleasant 48 years old white male who was recently diagnosed with [name], adenocarcinoma with positive EGFR mutation. The patient is currently on treatment with Gilotrif status post 3 weeks of treatment. He has mild skin rash and his diarrhea has significant improvement after the patient to start taking 2 tablets of Imodium every morning. He completed the course of whole brain irradiation under the care of Dr. Tammi Klippel.I recommended for the patient to continue his current treatment with Gilotrif at 40 mg by mouth daily. I would see him back for followup visit in one month after repeating CT scan of the chest,  abdomen and pelvis for restaging of his disease. The patient will continue also on monthly Xgeva for the metastatic bone disease. He was advised to call immediately if he has any concerning symptoms in the interval.  Disclaimer: This note was dictated with voice recognition software. Similar sounding words can inadvertently be transcribed and may be missed upon review. Eilleen Kempf., MD 05/01/2014

## 2014-04-29 NOTE — Telephone Encounter (Signed)
gv and printed appt scheda dn avs for pt for SEpt...gv pt barium

## 2014-04-30 NOTE — Patient Instructions (Signed)
Continue taking Imodium 1-2 tablets by mouth every morning. You may use the Lomotil if needed Continue taking Gilotrif at the current dose Followup in one month with restaging CT scan of the chest, abdomen and pelvis to reevaluate your disease

## 2014-05-02 LAB — AFB CULTURE WITH SMEAR (NOT AT ARMC): ACID FAST SMEAR: NONE SEEN

## 2014-05-06 ENCOUNTER — Ambulatory Visit (HOSPITAL_BASED_OUTPATIENT_CLINIC_OR_DEPARTMENT_OTHER): Payer: BLUE CROSS/BLUE SHIELD

## 2014-05-06 VITALS — BP 121/82 | HR 74 | Temp 98.6°F

## 2014-05-06 DIAGNOSIS — C3410 Malignant neoplasm of upper lobe, unspecified bronchus or lung: Secondary | ICD-10-CM | POA: Diagnosis not present

## 2014-05-06 DIAGNOSIS — C7951 Secondary malignant neoplasm of bone: Secondary | ICD-10-CM | POA: Diagnosis not present

## 2014-05-06 DIAGNOSIS — C3400 Malignant neoplasm of unspecified main bronchus: Secondary | ICD-10-CM

## 2014-05-06 DIAGNOSIS — C7952 Secondary malignant neoplasm of bone marrow: Secondary | ICD-10-CM

## 2014-05-06 MED ORDER — DENOSUMAB 120 MG/1.7ML ~~LOC~~ SOLN
120.0000 mg | Freq: Once | SUBCUTANEOUS | Status: AC
  Administered 2014-05-06: 120 mg via SUBCUTANEOUS
  Filled 2014-05-06: qty 1.7

## 2014-05-06 NOTE — Patient Instructions (Signed)
Denosumab injection What is this medicine? DENOSUMAB (den oh sue mab) slows bone breakdown. Prolia is used to treat osteoporosis in women after menopause and in men. Xgeva is used to prevent bone fractures and other bone problems caused by cancer bone metastases. Xgeva is also used to treat giant cell tumor of the bone. This medicine may be used for other purposes; ask your health care provider or pharmacist if you have questions. COMMON BRAND NAME(S): Prolia, XGEVA What should I tell my health care provider before I take this medicine? They need to know if you have any of these conditions: -dental disease -eczema -infection or history of infections -kidney disease or on dialysis -low blood calcium or vitamin D -malabsorption syndrome -scheduled to have surgery or tooth extraction -taking medicine that contains denosumab -thyroid or parathyroid disease -an unusual reaction to denosumab, other medicines, foods, dyes, or preservatives -pregnant or trying to get pregnant -breast-feeding How should I use this medicine? This medicine is for injection under the skin. It is given by a health care professional in a hospital or clinic setting. If you are getting Prolia, a special MedGuide will be given to you by the pharmacist with each prescription and refill. Be sure to read this information carefully each time. For Prolia, talk to your pediatrician regarding the use of this medicine in children. Special care may be needed. For Xgeva, talk to your pediatrician regarding the use of this medicine in children. While this drug may be prescribed for children as young as 13 years for selected conditions, precautions do apply. Overdosage: If you think you've taken too much of this medicine contact a poison control center or emergency room at once. Overdosage: If you think you have taken too much of this medicine contact a poison control center or emergency room at once. NOTE: This medicine is only for  you. Do not share this medicine with others. What if I miss a dose? It is important not to miss your dose. Call your doctor or health care professional if you are unable to keep an appointment. What may interact with this medicine? Do not take this medicine with any of the following medications: -other medicines containing denosumab This medicine may also interact with the following medications: -medicines that suppress the immune system -medicines that treat cancer -steroid medicines like prednisone or cortisone This list may not describe all possible interactions. Give your health care provider a list of all the medicines, herbs, non-prescription drugs, or dietary supplements you use. Also tell them if you smoke, drink alcohol, or use illegal drugs. Some items may interact with your medicine. What should I watch for while using this medicine? Visit your doctor or health care professional for regular checks on your progress. Your doctor or health care professional may order blood tests and other tests to see how you are doing. Call your doctor or health care professional if you get a cold or other infection while receiving this medicine. Do not treat yourself. This medicine may decrease your body's ability to fight infection. You should make sure you get enough calcium and vitamin D while you are taking this medicine, unless your doctor tells you not to. Discuss the foods you eat and the vitamins you take with your health care professional. See your dentist regularly. Brush and floss your teeth as directed. Before you have any dental work done, tell your dentist you are receiving this medicine. Do not become pregnant while taking this medicine or for 5 months after stopping   it. Women should inform their doctor if they wish to become pregnant or think they might be pregnant. There is a potential for serious side effects to an unborn child. Talk to your health care professional or pharmacist for more  information. What side effects may I notice from receiving this medicine? Side effects that you should report to your doctor or health care professional as soon as possible: -allergic reactions like skin rash, itching or hives, swelling of the face, lips, or tongue -breathing problems -chest pain -fast, irregular heartbeat -feeling faint or lightheaded, falls -fever, chills, or any other sign of infection -muscle spasms, tightening, or twitches -numbness or tingling -skin blisters or bumps, or is dry, peels, or red -slow healing or unexplained pain in the mouth or jaw -unusual bleeding or bruising Side effects that usually do not require medical attention (Report these to your doctor or health care professional if they continue or are bothersome.): -muscle pain -stomach upset, gas This list may not describe all possible side effects. Call your doctor for medical advice about side effects. You may report side effects to FDA at 1-800-FDA-1088. Where should I keep my medicine? This medicine is only given in a clinic, doctor's office, or other health care setting and will not be stored at home. NOTE: This sheet is a summary. It may not cover all possible information. If you have questions about this medicine, talk to your doctor, pharmacist, or health care provider.  2015, Elsevier/Gold Standard. (2012-02-28 12:37:47)  

## 2014-05-06 NOTE — Progress Notes (Signed)
  Radiation Oncology         [phone]) [phone] ________________________________  Name: Oscar Floyd  MRN: [id number]  Date: 04/01/2014  DOB: 21-Nov-1965  Simulation Verification Note  Status: outpatient  NARRATIVE: The patient was brought to the treatment unit and placed in the planned treatment position. The clinical setup was verified. Then port films were obtained and uploaded to the radiation oncology medical record software.  The treatment beams were carefully compared against the planned radiation fields. The position location and shape of the radiation fields was reviewed. They targeted volume of tissue appears to be appropriately covered by the radiation beams. Organs at risk appear to be excluded as planned.  Based on my personal review, I approved the simulation verification. The patient's treatment will proceed as planned.  ------------------------------------------------  Sheral Apley Tammi Klippel, M.D.

## 2014-05-08 ENCOUNTER — Telehealth: Payer: Self-pay | Admitting: Internal Medicine

## 2014-05-08 NOTE — Telephone Encounter (Signed)
Sent medical Records to Dr. Eula Listen office.

## 2014-05-09 ENCOUNTER — Encounter: Payer: Self-pay | Admitting: Radiation Oncology

## 2014-05-09 ENCOUNTER — Ambulatory Visit
Admission: RE | Admit: 2014-05-09 | Discharge: 2014-05-09 | Disposition: A | Discharge status: Home / Self Care | Payer: BC Managed Care – PPO | Source: Ambulatory Visit | Attending: Radiation Oncology | Admitting: Radiation Oncology

## 2014-05-09 VITALS — BP 127/87 | HR 90 | Resp 18 | Wt 208.0 lb

## 2014-05-09 DIAGNOSIS — C7949 Secondary malignant neoplasm of other parts of nervous system: Principal | ICD-10-CM

## 2014-05-09 DIAGNOSIS — C7931 Secondary malignant neoplasm of brain: Secondary | ICD-10-CM

## 2014-05-09 NOTE — Progress Notes (Addendum)
Working almost full time. Reports taking decadron 2 mg once per day and is set to taper off next week. Denies nausea or vomiting. Denies unsteady gait but, reports he occasionally feels shaky. Reports occasional brief ringing in the ears. Denies visual changes. Reports his appetite is decent despite taste changes. Reports he doesn't sleep as well as he once did despite alternating xanax and ambien. Reports taking Imodium AD each morning to prevent diarrhea. Weight and vitals stable. Denies cough or SOB. Continues to take GILOTRIF. Reports occasional minor headaches. Reports occasional thoracic spine pain.

## 2014-05-09 NOTE — Progress Notes (Signed)
Radiation Oncology         [phone]) [phone] ________________________________  Name: Oscar Floyd MRN: [id number]  Date: 05/09/2014  DOB: 01/18/1966  Follow-Up Visit Note  CC: [name], MD  [name], MD  Diagnosis:   48 yo man with numerous sub-centimeter brain metastases from cancer of the right upper lobe of the lung s/p Palliatie whole brain RT 04/01/2014-04/12/2014 to 30 Gy in 10 fractions   Interval Since Last Radiation:  4  weeks  Narrative:  The patient returns today for routine follow-up.  He is weaning off steroids with minimal symptoms.  He has no significant back or other pain.  He is back to work and taking gilotrif.                              ALLERGIES:  is allergic to pollen extract.  Meds: Current Outpatient Prescriptions  Medication Sig Dispense Refill  . afatinib dimaleate (GILOTRIF) 40 MG tablet Take 40 mg by mouth daily. Take on an empty stomach 1hr before or 2 hrs after meals.      . ALPRAZolam (XANAX) 1 MG tablet Take 1 mg by mouth daily as needed. Take as needed      . calcium-vitamin D (OSCAL) 250-125 MG-UNIT per tablet Take 1 tablet by mouth daily.      Marland Kitchen dexamethasone (DECADRON) 4 MG tablet Take 1 tablet (4 mg total) by mouth 2 (two) times daily. Full pill twice daily on day one, then half pill twice daily for 2 weeks, then half pill once daily for 2 weeks then stop  30 tablet  2  . esomeprazole (NEXIUM) 20 MG capsule Take by mouth daily.      . naproxen sodium (ANAPROX) 220 MG tablet Take 220 mg by mouth as needed.      . zolpidem (AMBIEN CR) 12.5 MG CR tablet Take 12.5 mg by mouth.      . BENZONATATE PO Take by mouth as needed.      . diphenoxylate-atropine (LOMOTIL) 2.5-0.025 MG per tablet Take 1 tablet by mouth every 6 (six) hours as needed for diarrhea or loose stools.  30 tablet  2  . prochlorperazine (COMPAZINE) 10 MG tablet Take 1 tablet (10 mg total) by mouth every 6 (six) hours as needed for nausea or vomiting.  30 tablet  1   No current  facility-administered medications for this encounter.    Physical Findings: The patient is in no acute distress. Patient is alert and oriented.  weight is 208 lb (94.348 kg). His blood pressure is 127/87 and his pulse is 90. His respiration is 18. .  No significant changes.   Lab Findings: Lab Results  Component Value Date   WBC 9.6 04/29/2014   HGB 15.7 04/29/2014   HCT 47.3 04/29/2014   MCV 89.8 04/29/2014   PLT 217 04/29/2014    @LASTCHEM @  Radiographic Findings: No results found.  Impression:  The patient is recovering from the effects of radiation.   Plan:  Post radiation baseline brain MRI in 3 months.  Patient has numerous spinal metastases and has asked if it is safe to play golf.  He denies back any pain requiring pain meds.  Review of the bone windows from his PET-CT shows numerous lesions, but, no significant cortical erosion in vertebral bodies, no spinal canal encroachment, and no nerve root compression.  I think it would be safe to play golf, with the understanding that  spinal injury is possible, but, not highly likely.  He understands that any new pain or neuro symptoms such as weakness or neuropathic pain should be reported immediately.  _____________________________________  Sheral Apley Tammi Klippel, M.D.

## 2014-05-16 ENCOUNTER — Telehealth: Payer: Self-pay | Admitting: Medical Oncology

## 2014-05-16 NOTE — Telephone Encounter (Signed)
Pt reports he is taking imodium 2 tablets daily while on gilotrif to prevent diarrhea. Today he noticed blood on the tissue after BM today . He described the stool as hard and he acknowledged that he did strain to defecate.I instructed him to stop imodium and to increase water and monitor for change in stool consistency. If his stool becomes frequent and  Or watery  To restart Imodium. I told him to call back tomorrow with update.

## 2014-05-24 ENCOUNTER — Other Ambulatory Visit: Payer: Self-pay | Admitting: *Deleted

## 2014-05-24 NOTE — Progress Notes (Signed)
Pt was schedule for lab and inj in the morning of 05/27/14 with a CT scan in the afternoon at 1:30pm.  It has only been 3 weeks since his last xgeva per pharmacy.  Onc tx schedule filled out to move inj appt to 9/21 when he sees Dr Vista Mink.  Also called pt to inform him that appts have been moved and he will come in for lab at 1pm on 9/14 and CT scan at 1:30.  No answer, left msg on [facility]

## 2014-05-27 ENCOUNTER — Other Ambulatory Visit (HOSPITAL_BASED_OUTPATIENT_CLINIC_OR_DEPARTMENT_OTHER): Payer: BC Managed Care – PPO

## 2014-05-27 ENCOUNTER — Ambulatory Visit: Payer: BC Managed Care – PPO

## 2014-05-27 ENCOUNTER — Other Ambulatory Visit: Payer: BC Managed Care – PPO

## 2014-05-27 ENCOUNTER — Encounter (HOSPITAL_COMMUNITY): Payer: Self-pay

## 2014-05-27 ENCOUNTER — Telehealth: Payer: Self-pay | Admitting: Internal Medicine

## 2014-05-27 ENCOUNTER — Ambulatory Visit (HOSPITAL_COMMUNITY)
Admission: RE | Admit: 2014-05-27 | Discharge: 2014-05-27 | Disposition: A | Discharge status: Home / Self Care | Payer: BC Managed Care – PPO | Source: Ambulatory Visit | Attending: Physician Assistant | Admitting: Physician Assistant

## 2014-05-27 DIAGNOSIS — C7951 Secondary malignant neoplasm of bone: Secondary | ICD-10-CM

## 2014-05-27 DIAGNOSIS — C7931 Secondary malignant neoplasm of brain: Secondary | ICD-10-CM | POA: Insufficient documentation

## 2014-05-27 DIAGNOSIS — C3490 Malignant neoplasm of unspecified part of unspecified bronchus or lung: Secondary | ICD-10-CM

## 2014-05-27 DIAGNOSIS — C787 Secondary malignant neoplasm of liver and intrahepatic bile duct: Secondary | ICD-10-CM | POA: Diagnosis not present

## 2014-05-27 DIAGNOSIS — C7949 Secondary malignant neoplasm of other parts of nervous system: Secondary | ICD-10-CM | POA: Diagnosis not present

## 2014-05-27 DIAGNOSIS — C7952 Secondary malignant neoplasm of bone marrow: Secondary | ICD-10-CM | POA: Diagnosis not present

## 2014-05-27 LAB — COMPREHENSIVE METABOLIC PANEL (CC13)
ALT: 58 U/L — ABNORMAL HIGH (ref 0–55)
ANION GAP: 7 meq/L (ref 3–11)
AST: 33 U/L (ref 5–34)
Albumin: 3.1 g/dL — ABNORMAL LOW (ref 3.5–5.0)
Alkaline Phosphatase: 150 U/L (ref 40–150)
BUN: 6.3 mg/dL — AB (ref 7.0–26.0)
CALCIUM: 7.8 mg/dL — AB (ref 8.4–10.4)
CHLORIDE: 109 meq/L (ref 98–109)
CO2: 23 meq/L (ref 22–29)
CREATININE: 0.8 mg/dL (ref 0.7–1.3)
GLUCOSE: 93 mg/dL (ref 70–140)
POTASSIUM: 4.9 meq/L (ref 3.5–5.1)
Sodium: 139 mEq/L (ref 136–145)
Total Bilirubin: 0.91 mg/dL (ref 0.20–1.20)
Total Protein: 6.3 g/dL — ABNORMAL LOW (ref 6.4–8.3)

## 2014-05-27 LAB — CBC WITH DIFFERENTIAL/PLATELET
BASO%: 0.4 % (ref 0.0–2.0)
BASOS ABS: 0 10*3/uL (ref 0.0–0.1)
EOS ABS: 0.2 10*3/uL (ref 0.0–0.5)
EOS%: 3.3 % (ref 0.0–7.0)
HCT: 41.7 % (ref 38.4–49.9)
HEMOGLOBIN: 14.2 g/dL (ref 13.0–17.1)
LYMPH#: 0.7 10*3/uL — AB (ref 0.9–3.3)
LYMPH%: 13.6 % — ABNORMAL LOW (ref 14.0–49.0)
MCH: 30.5 pg (ref 27.2–33.4)
MCHC: 34.1 g/dL (ref 32.0–36.0)
MCV: 89.7 fL (ref 79.3–98.0)
MONO#: 0.5 10*3/uL (ref 0.1–0.9)
MONO%: 9.8 % (ref 0.0–14.0)
NEUT%: 72.9 % (ref 39.0–75.0)
NEUTROS ABS: 3.8 10*3/uL (ref 1.5–6.5)
Platelets: 188 10*3/uL (ref 140–400)
RBC: 4.65 10*6/uL (ref 4.20–5.82)
RDW: 14 % (ref 11.0–14.6)
WBC: 5.2 10*3/uL (ref 4.0–10.3)

## 2014-05-27 MED ORDER — IOHEXOL 300 MG/ML  SOLN
100.0000 mL | Freq: Once | INTRAMUSCULAR | Status: AC | PRN
Start: 2014-05-27 — End: 2014-05-27
  Administered 2014-05-27: 100 mL via INTRAVENOUS

## 2014-05-27 NOTE — Telephone Encounter (Signed)
added inj per pof....pt aware

## 2014-05-31 ENCOUNTER — Other Ambulatory Visit: Payer: Self-pay | Admitting: Radiation Therapy

## 2014-05-31 DIAGNOSIS — C7931 Secondary malignant neoplasm of brain: Secondary | ICD-10-CM

## 2014-05-31 DIAGNOSIS — C7949 Secondary malignant neoplasm of other parts of nervous system: Principal | ICD-10-CM

## 2014-06-03 ENCOUNTER — Encounter: Payer: Self-pay | Admitting: Internal Medicine

## 2014-06-03 ENCOUNTER — Ambulatory Visit (HOSPITAL_BASED_OUTPATIENT_CLINIC_OR_DEPARTMENT_OTHER): Payer: BC Managed Care – PPO | Admitting: Internal Medicine

## 2014-06-03 ENCOUNTER — Telehealth: Payer: Self-pay | Admitting: Internal Medicine

## 2014-06-03 ENCOUNTER — Ambulatory Visit (HOSPITAL_BASED_OUTPATIENT_CLINIC_OR_DEPARTMENT_OTHER): Payer: BLUE CROSS/BLUE SHIELD

## 2014-06-03 ENCOUNTER — Encounter: Payer: Self-pay | Admitting: *Deleted

## 2014-06-03 VITALS — BP 121/86 | HR 87 | Temp 98.2°F | Resp 18 | Ht 74.0 in | Wt 207.6 lb

## 2014-06-03 DIAGNOSIS — C7952 Secondary malignant neoplasm of bone marrow: Secondary | ICD-10-CM | POA: Diagnosis not present

## 2014-06-03 DIAGNOSIS — C7949 Secondary malignant neoplasm of other parts of nervous system: Secondary | ICD-10-CM

## 2014-06-03 DIAGNOSIS — C787 Secondary malignant neoplasm of liver and intrahepatic bile duct: Secondary | ICD-10-CM

## 2014-06-03 DIAGNOSIS — C3410 Malignant neoplasm of upper lobe, unspecified bronchus or lung: Secondary | ICD-10-CM

## 2014-06-03 DIAGNOSIS — C7951 Secondary malignant neoplasm of bone: Secondary | ICD-10-CM | POA: Diagnosis not present

## 2014-06-03 DIAGNOSIS — C3400 Malignant neoplasm of unspecified main bronchus: Secondary | ICD-10-CM

## 2014-06-03 DIAGNOSIS — C7931 Secondary malignant neoplasm of brain: Secondary | ICD-10-CM | POA: Diagnosis not present

## 2014-06-03 DIAGNOSIS — C3491 Malignant neoplasm of unspecified part of right bronchus or lung: Secondary | ICD-10-CM

## 2014-06-03 MED ORDER — DENOSUMAB 120 MG/1.7ML ~~LOC~~ SOLN
120.0000 mg | Freq: Once | SUBCUTANEOUS | Status: AC
  Administered 2014-06-03: 120 mg via SUBCUTANEOUS
  Filled 2014-06-03: qty 1.7

## 2014-06-03 NOTE — Progress Notes (Signed)
[place] [phone]   [phone]  OFFICE PROGRESS NOTE  [name], MD 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  Primary site: Lung (Right)  Staging method: AJCC 7th Edition  Clinical: Stage IV (T2a, N0, M1b) signed by [name], MD on 03/26/2014 4:57 PM  Summary: Stage IV (T2a, N0, M1b)   PRIOR THERAPY: Status post whole brain irradiation under the care Dr. Tammi Klippel completed 04/12/2014   CURRENT THERAPY:  1) Gilotrif 40 mg po daily - therapy beginning 04/03/2014. Status post approximately 2 months of therapy  2) Xgeva 120 mcg subcutaneously on monthly basis.   DISEASE STAGE:  Lung cancer  Primary site: Lung (Right)  Staging method: AJCC 7th Edition  Clinical: Stage IV (T2a, N0, M1b) signed by [name], MD on 03/26/2014 4:57 PM  Summary: Stage IV (T2a, N0, M1b)  CHEMOTHERAPY INTENT: palliative  CURRENT # OF CHEMOTHERAPY CYCLES: 3 CURRENT ANTIEMETICS: none  CURRENT SMOKING STATUS: non-smoker/never smoker  ORAL CHEMOTHERAPY AND CONSENT: yes- Gilotrif  CURRENT BISPHOSPHONATES USE: none  PAIN MANAGEMENT: none  NARCOTICS INDUCED CONSTIPATION: none  LIVING WILL AND CODE STATUS:    INTERVAL HISTORY: Oscar Floyd 48 y.o. male returns to the clinic today for followup visit accompanied by his wife. The patient is tolerating his treatment with Gilotrif fairly well with no significant adverse effects except for few episodes of diarrhea as well as mild skin rash mainly on the nose and chest. He also has some nail changes consistent with paronychia. He is applying Neosporin to the nails with some improvement. The patient denied having  any significant chest pain, shortness breath, cough or hemoptysis. He denied having any significant weight loss or night sweats. He has no fever or chills, no nausea or vomiting. He had repeat CT scan of the chest, abdomen and pelvis performed recently and he is here for evaluation and discussion of his scan results.  MEDICAL HISTORY: Past Medical History  Diagnosis Date  . Lung cancer     RUL lung with mets to liver, bone and brain  . Hypertension     ALLERGIES:  is allergic to pollen extract.  MEDICATIONS:  Current Outpatient Prescriptions  Medication Sig Dispense Refill  . afatinib dimaleate (GILOTRIF) 40 MG tablet Take 40 mg by mouth daily. Take on an empty stomach 1hr before or 2 hrs after meals.      . ALPRAZolam (XANAX) 1 MG tablet Take 1 mg by mouth daily as needed. Take as needed      . calcium-vitamin D (OSCAL) 250-125 MG-UNIT per tablet Take 1 tablet by mouth daily.      Marland Kitchen esomeprazole (NEXIUM) 20 MG capsule Take by mouth daily.      Marland Kitchen loperamide (IMODIUM) 2 MG capsule Take 2 mg by mouth as needed for diarrhea or loose stools.      . naproxen sodium (ANAPROX) 220 MG tablet Take 220 mg by mouth as needed.      . zolpidem (AMBIEN CR) 12.5 MG CR tablet Take 12.5 mg by mouth.      . prochlorperazine (COMPAZINE) 10 MG tablet Take 1 tablet (10 mg total) by mouth every 6 (six) hours as needed for nausea or vomiting.  30 tablet  1   No current facility-administered medications for this visit.   Facility-Administered Medications Ordered in Other Visits  Medication Dose Route Frequency Provider Last Rate Last Dose  . denosumab (XGEVA) injection 120 mg  120 mg Subcutaneous Once [name], MD        SURGICAL HISTORY:  Past Surgical History  Procedure Laterality Date  . Vasectomy    . Video bronchoscopy Bilateral 03/20/2014    Procedure: VIDEO BRONCHOSCOPY WITH FLUORO;  Surgeon: Rigoberto Noel, MD;  Location: [facility];  Service: Cardiopulmonary;  Laterality: Bilateral;     REVIEW OF SYSTEMS:  Constitutional: negative Eyes: negative Ears, nose, mouth, throat, and face: negative Respiratory: negative Cardiovascular: negative Gastrointestinal: negative Genitourinary:negative Integument/breast: positive for dryness and rash Hematologic/lymphatic: negative Musculoskeletal:negative Neurological: negative Behavioral/Psych: negative Endocrine: negative Allergic/Immunologic: negative   PHYSICAL EXAMINATION: General appearance: alert, cooperative and no distress Head: Normocephalic, without obvious abnormality, atraumatic Neck: no adenopathy, no JVD, supple, symmetrical, trachea midline and thyroid not enlarged, symmetric, no tenderness/mass/nodules Lymph nodes: Cervical, supraclavicular, and axillary nodes normal. Resp: clear to auscultation bilaterally Back: symmetric, no curvature. ROM normal. No CVA tenderness. Cardio: regular rate and rhythm, S1, S2 normal, no murmur, click, rub or gallop GI: soft, non-tender; bowel sounds normal; no masses,  no organomegaly Extremities: extremities normal, atraumatic, no cyanosis or edema Neurologic: Alert and oriented X 3, normal strength and tone. Normal symmetric reflexes. Normal coordination and gait  ECOG PERFORMANCE STATUS: 1 - Symptomatic but completely ambulatory  Blood pressure 121/86, pulse 87, temperature 98.2 F (36.8 C), temperature source Oral, resp. rate 18, height 6' 2"  (1.88 m), weight 207 lb 9.6 oz (94.167 kg), SpO2 100.00%.  LABORATORY DATA: Lab Results  Component Value Date   WBC 5.2 05/27/2014   HGB 14.2 05/27/2014   HCT 41.7 05/27/2014   MCV 89.7 05/27/2014   PLT 188 05/27/2014      Chemistry      Component Value Date/Time   NA 139 05/27/2014 0830   K 4.9 05/27/2014 0830   CO2 23 05/27/2014 0830   BUN 6.3* 05/27/2014 0830   CREATININE 0.8 05/27/2014 0830      Component Value Date/Time   CALCIUM 7.8* 05/27/2014 0830   ALKPHOS 150 05/27/2014 0830   AST 33 05/27/2014 0830   ALT 58*  05/27/2014 0830   BILITOT 0.91 05/27/2014 0830       RADIOGRAPHIC STUDIES: Ct Chest W Contrast  05/27/2014   CLINICAL DATA:  Metastatic non-small cell lung cancer with metastasis to the brain, liver and bones.  EXAM: CT CHEST, ABDOMEN, AND PELVIS WITH CONTRAST  TECHNIQUE: Multidetector CT imaging of the chest, abdomen and pelvis was performed following the standard protocol during bolus administration of intravenous contrast.  CONTRAST:  127m OMNIPAQUE IOHEXOL 300 MG/ML  SOLN  COMPARISON:  03/25/2014  FINDINGS: CT CHEST FINDINGS  Mediastinum: The heart size appears normal. There is no pericardial effusion. No mediastinal or hilar adenopathy identified. The trachea appears patent and is midline. Normal appearance of the esophagus. No axillary or supraclavicular adenopathy.  Lungs/Pleura: No pleural effusion. The paramediastinal right upper lobe lung mass is decreased in size from previous exam. This measures 1.9 by 2.6 cm, image 31/series 4. Previously this measured 3.5 x 4.9 cm. Again noted are multiple small pulmonary nodules within both lungs, improved from previous exam. Previously noted interlobular septal thickening within both lungs is also improved from previous exam.  CT ABDOMEN AND PELVIS FINDINGS  Hepatobiliary: No suspicious liver abnormality. The previously noted  liver lesions are no longer visualized.  Spleen: The spleen is unremarkable.  Pancreas: Normal appearance of the pancreas.  Stomach/Bowel: The stomach appears normal. The small bowel loops have a normal course and caliber. No bowel obstruction. The appendix appears normal. Normal appearance of the terminal ileum. The colon appears normal.  Adrenals/urinary tract: Normal appearance of the adrenal glands. The kidneys are both unremarkable. The urinary bladder is normal. The prostate gland and seminal vesicles are unremarkable.  Vascular/Lymphatic: Normal caliber of the abdominal aorta. No retroperitoneal adenopathy. There is no pelvic or  inguinal adenopathy. No enlarged mesenteric lymph nodes.  Reproductive: Prostate gland and seminal vesicles are unremarkable.  Muskuloskeletal: Review of the visualized osseous structures shows interval increase in sclerosis throughout the axial and appendicular skeleton with interval healing of previously noted lytic metastases.  Other: None  IMPRESSION: 1. Interval response to therapy. 2. Right upper lobe paramediastinal lung mass has decreased in size in the interval. There has been interval improvement in diffuse pulmonary nodularity in both lungs. 3. Resolution of previous liver metastasis. 4. Increase in sclerosis throughout the visualized axial and appendicular skeleton compatible with interval healing of previous lytic bone metastases.   Electronically Signed   By: Kerby Moors M.D.   On: 05/27/2014 16:32   Ct Abdomen Pelvis W Contrast  05/27/2014   CLINICAL DATA:  Metastatic non-small cell lung cancer with metastasis to the brain, liver and bones.  EXAM: CT CHEST, ABDOMEN, AND PELVIS WITH CONTRAST  TECHNIQUE: Multidetector CT imaging of the chest, abdomen and pelvis was performed following the standard protocol during bolus administration of intravenous contrast.  CONTRAST:  110m OMNIPAQUE IOHEXOL 300 MG/ML  SOLN  COMPARISON:  03/25/2014  FINDINGS: CT CHEST FINDINGS  Mediastinum: The heart size appears normal. There is no pericardial effusion. No mediastinal or hilar adenopathy identified. The trachea appears patent and is midline. Normal appearance of the esophagus. No axillary or supraclavicular adenopathy.  Lungs/Pleura: No pleural effusion. The paramediastinal right upper lobe lung mass is decreased in size from previous exam. This measures 1.9 by 2.6 cm, image 31/series 4. Previously this measured 3.5 x 4.9 cm. Again noted are multiple small pulmonary nodules within both lungs, improved from previous exam. Previously noted interlobular septal thickening within both lungs is also improved from  previous exam.  CT ABDOMEN AND PELVIS FINDINGS  Hepatobiliary: No suspicious liver abnormality. The previously noted liver lesions are no longer visualized.  Spleen: The spleen is unremarkable.  Pancreas: Normal appearance of the pancreas.  Stomach/Bowel: The stomach appears normal. The small bowel loops have a normal course and caliber. No bowel obstruction. The appendix appears normal. Normal appearance of the terminal ileum. The colon appears normal.  Adrenals/urinary tract: Normal appearance of the adrenal glands. The kidneys are both unremarkable. The urinary bladder is normal. The prostate gland and seminal vesicles are unremarkable.  Vascular/Lymphatic: Normal caliber of the abdominal aorta. No retroperitoneal adenopathy. There is no pelvic or inguinal adenopathy. No enlarged mesenteric lymph nodes.  Reproductive: Prostate gland and seminal vesicles are unremarkable.  Muskuloskeletal: Review of the visualized osseous structures shows interval increase in sclerosis throughout the axial and appendicular skeleton with interval healing of previously noted lytic metastases.  Other: None  IMPRESSION: 1. Interval response to therapy. 2. Right upper lobe paramediastinal lung mass has decreased in size in the interval. There has been interval improvement in diffuse pulmonary nodularity in both lungs. 3. Resolution of previous liver metastasis. 4. Increase in sclerosis throughout the visualized axial and appendicular skeleton  compatible with interval healing of previous lytic bone metastases.   Electronically Signed   By: Kerby Moors M.D.   On: 05/27/2014 16:32    ASSESSMENT AND PLAN: This is a very pleasant 48 years old white male with [name], adenocarcinoma with positive EGFR mutation in exon 19 currently undergoing treatment with oral Gilotrif 40 mg by mouth daily and tolerating his treatment fairly well. His recent CT scan of the chest, abdomen and pelvis showed significant  improvement in his disease. I discussed the scan results and showed the images to the patient and his wife. I recommended for him to continue his current treatment with Gilotrif at the same dose. For the metastatic bone disease, the patient will continue with monthly Xgeva. He would come back for followup visit in one month with repeat CBC and comprehensive metabolic panel. He was advised to call immediately if he has any concerning symptoms in the interval. The patient voices understanding of current disease status and treatment options and is in agreement with the current care plan.  All questions were answered. The patient knows to call the clinic with any problems, questions or concerns. We can certainly see the patient much sooner if necessary.  I spent 15 minutes counseling the patient face to face. The total time spent in the appointment was 25 minutes.  Disclaimer: This note was dictated with voice recognition software. Similar sounding words can inadvertently be transcribed and may not be corrected upon review.

## 2014-06-03 NOTE — Telephone Encounter (Signed)
Pt confirmed labs/ov/inj per 09/21 POF, gave pt AVS...Marland KitchenMarland KitchenKJ

## 2014-06-03 NOTE — Progress Notes (Signed)
Spoke with patient and wife at [facility] today.  We discussed side effects of oral biologic.  No barriers identified at this time.

## 2014-06-05 ENCOUNTER — Telehealth: Payer: Self-pay | Admitting: *Deleted

## 2014-06-05 ENCOUNTER — Other Ambulatory Visit: Payer: Self-pay | Admitting: Internal Medicine

## 2014-06-05 ENCOUNTER — Encounter: Payer: Self-pay | Admitting: *Deleted

## 2014-06-05 DIAGNOSIS — C3491 Malignant neoplasm of unspecified part of right bronchus or lung: Secondary | ICD-10-CM

## 2014-06-05 MED ORDER — SILVER NITRATE 10 % EX OINT: 1.0000 "application " | TOPICAL_OINTMENT | Freq: Two times a day (BID) | CUTANEOUS | Status: DC

## 2014-06-05 NOTE — Telephone Encounter (Signed)
Called pt back to follow up.  Dr. Julien Nordmann ordered medication for patients fingers.  I gave him instruction regarding medication.  He verbalized understanding.

## 2014-06-05 NOTE — [facility] Oncology Navigator Note (Unsigned)
Patient called.  Need medication for fingers.  I will follow up with Dr. Julien Nordmann.  Pt aware.

## 2014-06-07 ENCOUNTER — Other Ambulatory Visit: Payer: Self-pay | Admitting: Medical Oncology

## 2014-06-07 ENCOUNTER — Telehealth: Payer: Self-pay | Admitting: *Deleted

## 2014-06-07 ENCOUNTER — Encounter: Payer: Self-pay | Admitting: *Deleted

## 2014-06-07 DIAGNOSIS — C3491 Malignant neoplasm of unspecified part of right bronchus or lung: Secondary | ICD-10-CM

## 2014-06-07 MED ORDER — SILVER NITRATE 10 % EX OINT: 1.0000 "application " | TOPICAL_OINTMENT | Freq: Two times a day (BID) | CUTANEOUS | Status: DC

## 2014-06-07 NOTE — [facility] Oncology Navigator Note (Unsigned)
Patient left vm message regarding prescription for Silver Nitrate.  He has been unable to fill at [facility].  I called [facility] patient pharmacy, they stated they would order but do not have the medication right now.  Pharmacy staff stated they had the order and will get medication but will not be here until Monday.  I called and notified patient of the above information.  I did ask the patient that he call if any pain, swelling, drainage or fever develop, to call us for help.

## 2014-06-07 NOTE — Telephone Encounter (Signed)
Silver nitrate ointment not available - order changed to sticks.

## 2014-06-07 NOTE — Telephone Encounter (Signed)
Called pt to notify of perscription

## 2014-06-12 ENCOUNTER — Telehealth: Payer: Self-pay | Admitting: *Deleted

## 2014-06-12 NOTE — Telephone Encounter (Signed)
Patient called due to "not feeling well" he was having diharrea.  I spoke with Dr. Julien Nordmann.  He stated to continue the imodium one tablet after each loose stool up to 16 mg in a day.  I also instructed patient on BRAT diet which he is doing.  I asked that he call back and let us know if he is doing better.

## 2014-06-25 ENCOUNTER — Other Ambulatory Visit: Payer: Self-pay | Admitting: Medical Oncology

## 2014-06-25 DIAGNOSIS — C3490 Malignant neoplasm of unspecified part of unspecified bronchus or lung: Secondary | ICD-10-CM

## 2014-06-26 MED ORDER — AFATINIB DIMALEATE 40 MG PO TABS: 40.0000 mg | ORAL_TABLET | Freq: Every day | ORAL | Status: DC

## 2014-07-01 ENCOUNTER — Telehealth: Payer: Self-pay | Admitting: *Deleted

## 2014-07-01 NOTE — Telephone Encounter (Signed)
Pt called stating that he is having "stomach pains".  He describes it as an ache.  No nausea or vomiting associated with stomach ache.  He has diarrhea that is now new, 1-2 loose stools daily.  He is taking imodium daily.  Per Dr Vista Mink, ok to take pain meds prn and call if anything else develops or changes.

## 2014-07-04 ENCOUNTER — Other Ambulatory Visit (HOSPITAL_BASED_OUTPATIENT_CLINIC_OR_DEPARTMENT_OTHER): Payer: BC Managed Care – PPO

## 2014-07-04 ENCOUNTER — Ambulatory Visit (HOSPITAL_BASED_OUTPATIENT_CLINIC_OR_DEPARTMENT_OTHER): Payer: BC Managed Care – PPO

## 2014-07-04 ENCOUNTER — Telehealth: Payer: Self-pay | Admitting: Internal Medicine

## 2014-07-04 ENCOUNTER — Ambulatory Visit (HOSPITAL_BASED_OUTPATIENT_CLINIC_OR_DEPARTMENT_OTHER): Payer: BC Managed Care – PPO | Admitting: Internal Medicine

## 2014-07-04 ENCOUNTER — Encounter: Payer: Self-pay | Admitting: Internal Medicine

## 2014-07-04 VITALS — BP 127/82 | HR 83 | Temp 98.5°F | Resp 20 | Ht 74.0 in | Wt 203.6 lb

## 2014-07-04 VITALS — BP 126/83 | HR 78 | Temp 98.0°F

## 2014-07-04 DIAGNOSIS — R634 Abnormal weight loss: Secondary | ICD-10-CM

## 2014-07-04 DIAGNOSIS — L03019 Cellulitis of unspecified finger: Secondary | ICD-10-CM

## 2014-07-04 DIAGNOSIS — C787 Secondary malignant neoplasm of liver and intrahepatic bile duct: Secondary | ICD-10-CM

## 2014-07-04 DIAGNOSIS — C7951 Secondary malignant neoplasm of bone: Secondary | ICD-10-CM

## 2014-07-04 DIAGNOSIS — K521 Toxic gastroenteritis and colitis: Secondary | ICD-10-CM | POA: Insufficient documentation

## 2014-07-04 DIAGNOSIS — F329 Major depressive disorder, single episode, unspecified: Secondary | ICD-10-CM

## 2014-07-04 DIAGNOSIS — C3411 Malignant neoplasm of upper lobe, right bronchus or lung: Secondary | ICD-10-CM

## 2014-07-04 DIAGNOSIS — C7931 Secondary malignant neoplasm of brain: Secondary | ICD-10-CM

## 2014-07-04 DIAGNOSIS — C3400 Malignant neoplasm of unspecified main bronchus: Secondary | ICD-10-CM

## 2014-07-04 DIAGNOSIS — C3491 Malignant neoplasm of unspecified part of right bronchus or lung: Secondary | ICD-10-CM

## 2014-07-04 DIAGNOSIS — L270 Generalized skin eruption due to drugs and medicaments taken internally: Secondary | ICD-10-CM | POA: Insufficient documentation

## 2014-07-04 LAB — CBC WITH DIFFERENTIAL/PLATELET
BASO%: 0.5 % (ref 0.0–2.0)
BASOS ABS: 0 10*3/uL (ref 0.0–0.1)
EOS%: 4.3 % (ref 0.0–7.0)
Eosinophils Absolute: 0.2 10*3/uL (ref 0.0–0.5)
HEMATOCRIT: 41.9 % (ref 38.4–49.9)
HGB: 13.9 g/dL (ref 13.0–17.1)
LYMPH#: 0.9 10*3/uL (ref 0.9–3.3)
LYMPH%: 19.8 % (ref 14.0–49.0)
MCH: 29.2 pg (ref 27.2–33.4)
MCHC: 33.2 g/dL (ref 32.0–36.0)
MCV: 88 fL (ref 79.3–98.0)
MONO#: 0.4 10*3/uL (ref 0.1–0.9)
MONO%: 8.6 % (ref 0.0–14.0)
NEUT#: 2.9 10*3/uL (ref 1.5–6.5)
NEUT%: 66.8 % (ref 39.0–75.0)
Platelets: 224 10*3/uL (ref 140–400)
RBC: 4.76 10*6/uL (ref 4.20–5.82)
RDW: 13.3 % (ref 11.0–14.6)
WBC: 4.4 10*3/uL (ref 4.0–10.3)

## 2014-07-04 LAB — COMPREHENSIVE METABOLIC PANEL (CC13)
ALBUMIN: 3.1 g/dL — AB (ref 3.5–5.0)
ALT: 31 U/L (ref 0–55)
ANION GAP: 6 meq/L (ref 3–11)
AST: 21 U/L (ref 5–34)
Alkaline Phosphatase: 132 U/L (ref 40–150)
BUN: 5.9 mg/dL — ABNORMAL LOW (ref 7.0–26.0)
CALCIUM: 8.8 mg/dL (ref 8.4–10.4)
CHLORIDE: 109 meq/L (ref 98–109)
CO2: 26 meq/L (ref 22–29)
CREATININE: 0.9 mg/dL (ref 0.7–1.3)
Glucose: 97 mg/dl (ref 70–140)
POTASSIUM: 4.6 meq/L (ref 3.5–5.1)
Sodium: 140 mEq/L (ref 136–145)
TOTAL PROTEIN: 6 g/dL — AB (ref 6.4–8.3)
Total Bilirubin: 0.8 mg/dL (ref 0.20–1.20)

## 2014-07-04 MED ORDER — MIRTAZAPINE 30 MG PO TABS: 30.0000 mg | ORAL_TABLET | Freq: Every day | ORAL | Status: DC

## 2014-07-04 MED ORDER — DENOSUMAB 120 MG/1.7ML ~~LOC~~ SOLN
120.0000 mg | Freq: Once | SUBCUTANEOUS | Status: AC
  Administered 2014-07-04: 120 mg via SUBCUTANEOUS
  Filled 2014-07-04: qty 1.7

## 2014-07-04 NOTE — Telephone Encounter (Signed)
gv adn printed appt sched and avs for pt for NOV

## 2014-07-04 NOTE — Patient Instructions (Signed)
Denosumab injection What is this medicine? DENOSUMAB (den oh sue mab) slows bone breakdown. Prolia is used to treat osteoporosis in women after menopause and in men. Xgeva is used to prevent bone fractures and other bone problems caused by cancer bone metastases. Xgeva is also used to treat giant cell tumor of the bone. This medicine may be used for other purposes; ask your health care provider or pharmacist if you have questions. COMMON BRAND NAME(S): Prolia, XGEVA What should I tell my health care provider before I take this medicine? They need to know if you have any of these conditions: -dental disease -eczema -infection or history of infections -kidney disease or on dialysis -low blood calcium or vitamin D -malabsorption syndrome -scheduled to have surgery or tooth extraction -taking medicine that contains denosumab -thyroid or parathyroid disease -an unusual reaction to denosumab, other medicines, foods, dyes, or preservatives -pregnant or trying to get pregnant -breast-feeding How should I use this medicine? This medicine is for injection under the skin. It is given by a health care professional in a hospital or clinic setting. If you are getting Prolia, a special MedGuide will be given to you by the pharmacist with each prescription and refill. Be sure to read this information carefully each time. For Prolia, talk to your pediatrician regarding the use of this medicine in children. Special care may be needed. For Xgeva, talk to your pediatrician regarding the use of this medicine in children. While this drug may be prescribed for children as young as 13 years for selected conditions, precautions do apply. Overdosage: If you think you've taken too much of this medicine contact a poison control center or emergency room at once. Overdosage: If you think you have taken too much of this medicine contact a poison control center or emergency room at once. NOTE: This medicine is only for  you. Do not share this medicine with others. What if I miss a dose? It is important not to miss your dose. Call your doctor or health care professional if you are unable to keep an appointment. What may interact with this medicine? Do not take this medicine with any of the following medications: -other medicines containing denosumab This medicine may also interact with the following medications: -medicines that suppress the immune system -medicines that treat cancer -steroid medicines like prednisone or cortisone This list may not describe all possible interactions. Give your health care provider a list of all the medicines, herbs, non-prescription drugs, or dietary supplements you use. Also tell them if you smoke, drink alcohol, or use illegal drugs. Some items may interact with your medicine. What should I watch for while using this medicine? Visit your doctor or health care professional for regular checks on your progress. Your doctor or health care professional may order blood tests and other tests to see how you are doing. Call your doctor or health care professional if you get a cold or other infection while receiving this medicine. Do not treat yourself. This medicine may decrease your body's ability to fight infection. You should make sure you get enough calcium and vitamin D while you are taking this medicine, unless your doctor tells you not to. Discuss the foods you eat and the vitamins you take with your health care professional. See your dentist regularly. Brush and floss your teeth as directed. Before you have any dental work done, tell your dentist you are receiving this medicine. Do not become pregnant while taking this medicine or for 5 months after stopping   it. Women should inform their doctor if they wish to become pregnant or think they might be pregnant. There is a potential for serious side effects to an unborn child. Talk to your health care professional or pharmacist for more  information. What side effects may I notice from receiving this medicine? Side effects that you should report to your doctor or health care professional as soon as possible: -allergic reactions like skin rash, itching or hives, swelling of the face, lips, or tongue -breathing problems -chest pain -fast, irregular heartbeat -feeling faint or lightheaded, falls -fever, chills, or any other sign of infection -muscle spasms, tightening, or twitches -numbness or tingling -skin blisters or bumps, or is dry, peels, or red -slow healing or unexplained pain in the mouth or jaw -unusual bleeding or bruising Side effects that usually do not require medical attention (Report these to your doctor or health care professional if they continue or are bothersome.): -muscle pain -stomach upset, gas This list may not describe all possible side effects. Call your doctor for medical advice about side effects. You may report side effects to FDA at 1-800-FDA-1088. Where should I keep my medicine? This medicine is only given in a clinic, doctor's office, or other health care setting and will not be stored at home. NOTE: This sheet is a summary. It may not cover all possible information. If you have questions about this medicine, talk to your doctor, pharmacist, or health care provider.  2015, Elsevier/Gold Standard. (2012-02-28 12:37:47)  

## 2014-07-04 NOTE — Progress Notes (Signed)
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  Primary site: Lung (Right)  Staging method: AJCC 7th Edition  Clinical: Stage IV (T2a, N0, M1b) signed by [name], MD on 03/26/2014 4:57 PM  Summary: Stage IV (T2a, N0, M1b)   PRIOR THERAPY: Status post whole brain irradiation under the care Dr. Tammi Klippel completed 04/12/2014   CURRENT THERAPY:  1) Gilotrif 40 mg po daily - therapy beginning 04/03/2014. Status post approximately 2 months of therapy  2) Xgeva 120 mcg subcutaneously on monthly basis.   DISEASE STAGE:  Lung cancer  Primary site: Lung (Right)  Staging method: AJCC 7th Edition  Clinical: Stage IV (T2a, N0, M1b) signed by [name], MD on 03/26/2014 4:57 PM  Summary: Stage IV (T2a, N0, M1b)  CHEMOTHERAPY INTENT: palliative  CURRENT # OF CHEMOTHERAPY CYCLES: 3 CURRENT ANTIEMETICS: none  CURRENT SMOKING STATUS: non-smoker/never smoker  ORAL CHEMOTHERAPY AND CONSENT: yes- Gilotrif  CURRENT BISPHOSPHONATES USE: none  PAIN MANAGEMENT: none  NARCOTICS INDUCED CONSTIPATION: none  LIVING WILL AND CODE STATUS:   INTERVAL HISTORY: Oscar Floyd 48 y.o. male returns to the clinic today for followup visit. The patient is tolerating his treatment with Gilotrif fairly well with no significant adverse effects except for few episodes of diarrhea as well as mild skin rash mainly on the nose and chest. He also has some nail changes consistent with paronychia. He is applying Neosporin to the nails with some improvement. He is also applying silver nitrate swabs to these  areas. The patient denied having any significant chest pain, shortness of breath, cough or hemoptysis. He has no fever or chills, no nausea or vomiting. He lost few pounds recently.   MEDICAL HISTORY: Past Medical History  Diagnosis Date  . Lung cancer     RUL lung with mets to liver, bone and brain  . Hypertension     ALLERGIES:  has No Known Allergies.  MEDICATIONS:  Current Outpatient Prescriptions  Medication Sig Dispense Refill  . afatinib dimaleate (GILOTRIF) 40 MG tablet Take 1 tablet (40 mg total) by mouth daily. Take on an empty stomach 1hr before or 2 hrs after meals.  30 tablet  1  . ALPRAZolam (XANAX) 1 MG tablet Take 1 mg by mouth daily as needed. Take as needed      . calcium-vitamin D (OSCAL) 250-125 MG-UNIT per tablet Take 1 tablet by mouth daily.      Marland Kitchen esomeprazole (NEXIUM) 20 MG capsule Take by mouth 2 (two) times daily before a meal.       . loperamide (IMODIUM) 2 MG capsule Take 2 mg by mouth as needed for diarrhea or loose stools.      . naproxen sodium (ANAPROX) 220 MG tablet Take 220 mg by mouth as needed.      . prochlorperazine (COMPAZINE) 10 MG tablet Take 1 tablet (10 mg total) by mouth every 6 (six) hours as needed for nausea or vomiting.  30 tablet  1  . Silver Nitrate 10 % OINT Apply 1 application topically 2 (two) times daily.  10 g  0  . zolpidem (AMBIEN CR) 12.5  MG CR tablet Take 12.5 mg by mouth.       No current facility-administered medications for this visit.    SURGICAL HISTORY:  Past Surgical History  Procedure Laterality Date  . Vasectomy    . Video bronchoscopy Bilateral 03/20/2014    Procedure: VIDEO BRONCHOSCOPY WITH FLUORO;  Surgeon: Rigoberto Noel, MD;  Location: [facility];  Service: Cardiopulmonary;  Laterality: Bilateral;    REVIEW OF SYSTEMS:  Constitutional: negative Eyes: negative Ears, nose, mouth, throat, and face: negative Respiratory: negative Cardiovascular: negative Gastrointestinal:  negative Genitourinary:negative Integument/breast: positive for dryness and rash Hematologic/lymphatic: negative Musculoskeletal:negative Neurological: negative Behavioral/Psych: negative Endocrine: negative Allergic/Immunologic: negative   PHYSICAL EXAMINATION: General appearance: alert, cooperative and no distress Head: Normocephalic, without obvious abnormality, atraumatic Neck: no adenopathy, no JVD, supple, symmetrical, trachea midline and thyroid not enlarged, symmetric, no tenderness/mass/nodules Lymph nodes: Cervical, supraclavicular, and axillary nodes normal. Resp: clear to auscultation bilaterally Back: symmetric, no curvature. ROM normal. No CVA tenderness. Cardio: regular rate and rhythm, S1, S2 normal, no murmur, click, rub or gallop GI: soft, non-tender; bowel sounds normal; no masses,  no organomegaly Extremities: extremities normal, atraumatic, no cyanosis or edema Neurologic: Alert and oriented X 3, normal strength and tone. Normal symmetric reflexes. Normal coordination and gait  ECOG PERFORMANCE STATUS: 1 - Symptomatic but completely ambulatory  Blood pressure 127/82, pulse 83, temperature 98.5 F (36.9 C), temperature source Oral, resp. rate 20, height 6' 2"  (1.88 m), weight 203 lb 9.6 oz (92.352 kg), SpO2 100.00%.  LABORATORY DATA: Lab Results  Component Value Date   WBC 4.4 07/04/2014   HGB 13.9 07/04/2014   HCT 41.9 07/04/2014   MCV 88.0 07/04/2014   PLT 224 07/04/2014      Chemistry      Component Value Date/Time   NA 140 07/04/2014 1009   K 4.6 07/04/2014 1009   CO2 26 07/04/2014 1009   BUN 5.9* 07/04/2014 1009   CREATININE 0.9 07/04/2014 1009      Component Value Date/Time   CALCIUM 8.8 07/04/2014 1009   ALKPHOS 132 07/04/2014 1009   AST 21 07/04/2014 1009   ALT 31 07/04/2014 1009   BILITOT 0.80 07/04/2014 1009       RADIOGRAPHIC STUDIES:  ASSESSMENT AND PLAN: This is a very pleasant 48 years old white male with stage IV non-small  cell lung cancer, adenocarcinoma with positive EGFR mutation in exon 19 currently undergoing treatment with oral Gilotrif 40 mg by mouth daily and tolerating his treatment fairly well. I recommended for him to continue his current treatment with Gilotrif at the same dose. For the metastatic bone disease, the patient will continue with monthly Xgeva. For the weight loss and depression, I will start the patient on Remeron 30 mg by mouth each bedtime. He would come back for followup visit in one month with repeat CBC and comprehensive metabolic panel. He was advised to call immediately if he has any concerning symptoms in the interval. The patient voices understanding of current disease status and treatment options and is in agreement with the current care plan.  All questions were answered. The patient knows to call the clinic with any problems, questions or concerns. We can certainly see the patient much sooner if necessary.  Disclaimer: This note was dictated with voice recognition software. Similar sounding words can inadvertently be transcribed and may not be corrected upon review.

## 2014-07-05 ENCOUNTER — Other Ambulatory Visit: Payer: Self-pay | Admitting: *Deleted

## 2014-07-05 MED ORDER — MIRTAZAPINE 30 MG PO TABS: 30.0000 mg | ORAL_TABLET | Freq: Every day | ORAL | Status: DC

## 2014-07-08 ENCOUNTER — Telehealth: Payer: Self-pay | Admitting: Medical Oncology

## 2014-07-09 NOTE — Telephone Encounter (Signed)
Per Juliann Pulse at [facility] , Remeron was mailed out 07/05/14 via [place] postal service. I [facility] that all [facility] Pts wants to get refills at local pharmacy. Tracking number [id number]. Pt notified to expect delivery this week.

## 2014-07-11 ENCOUNTER — Telehealth: Payer: Self-pay | Admitting: Medical Oncology

## 2014-07-11 NOTE — Telephone Encounter (Signed)
I sent pt the tracking number .

## 2014-07-12 ENCOUNTER — Telehealth: Payer: Self-pay | Admitting: Medical Oncology

## 2014-07-12 NOTE — Telephone Encounter (Signed)
[facility] -they tracked the remeron to the postal office en route to pts home . [facility] and they will -pt notified.

## 2014-07-18 ENCOUNTER — Ambulatory Visit
Admission: RE | Admit: 2014-07-18 | Discharge: 2014-07-18 | Disposition: A | Discharge status: Home / Self Care | Payer: BC Managed Care – PPO | Source: Ambulatory Visit | Attending: Radiation Oncology | Admitting: Radiation Oncology

## 2014-07-18 DIAGNOSIS — C7931 Secondary malignant neoplasm of brain: Secondary | ICD-10-CM

## 2014-07-18 DIAGNOSIS — C7949 Secondary malignant neoplasm of other parts of nervous system: Principal | ICD-10-CM

## 2014-07-18 MED ORDER — GADOBENATE DIMEGLUMINE 529 MG/ML IV SOLN
19.0000 mL | Freq: Once | INTRAVENOUS | Status: AC | PRN
  Administered 2014-07-18: 19 mL via INTRAVENOUS

## 2014-07-19 ENCOUNTER — Other Ambulatory Visit: Payer: BC Managed Care – PPO

## 2014-07-20 ENCOUNTER — Encounter: Payer: Self-pay | Admitting: Radiation Therapy

## 2014-07-20 NOTE — Progress Notes (Signed)
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 Coletta Memos in June 2015 complaining of dry cough. He was treated with a tapered dose of prednisone and antibiotics with no significant improvement.   Chest x-ray was performed on 03/13/2014 and it showed abnormal right hilar contour with right parahilar interstitial density in the anterior right lung airspace disease.   On 03/20/2014 he underwent video bronchoscopy with biopsy of the right upper lobe lung mass.   The final pathology showed poorly differentiated adenocarcinoma of lung primary with positive EGFR mutation.     Staging scans including a PET on 03/25/14 2015 were completed. This showed the dominant right upper lobe mass was hypermetabolic consistent with primary bronchogenic carcinoma. There was widespread metastatic disease to the lungs, liver and bones.        An MRI of the brain was performed on 03/26/14 and it showed too numerous to count, small enhancing brain metastasis. These lesion ranged from punctate to 0.6 CM in size. There were also bone metastasis in the clivus and upper cervical vertebrae.  Oscar Floyd was treated with Whole brain XRT and this is a  3 month follow-up with a new base line MRI completed on 07/18/14.  Radiation treatment dates:   04/01/2014-04/12/2014 Site/dose:   The whole brain was treated to 30 Gy in 10 fractions of 3 Gy ______________   CURRENT THERAPY:   1) Gilotrif 40 mg po daily - therapy beginning 04/03/2014.   2) Xgeva 120 mcg subcutaneously on monthly basis.  Narrative:  The patient returns today for  routine follow-up.  The recent films were presented in our multidisciplinary conference with neuroradiology just prior to the clinic.  Continues to [employer] daily. Denies pain. Weight and vitals stable. Reports appetite is improving. Denies taking decadron in two months. Denies headache, dizziness, nausea or vomiting. Steady gait noted. Reports that remeron aids in sleep.Denies diplopia. Reports occasional ringing in the ears.  Playing golf with minimal occasional exertional back pain.                              ALLERGIES:  has No Known Allergies.  Meds: Current Outpatient Prescriptions  Medication Sig Dispense Refill  . afatinib dimaleate (GILOTRIF) 40 MG tablet Take 1 tablet (40 mg total) by mouth daily. Take on an empty stomach 1hr before or 2 hrs after meals. 30 tablet 1  . ALPRAZolam (XANAX) 1 MG tablet Take 1 mg by mouth daily as needed. Take as needed    . calcium-vitamin D (OSCAL) 250-125 MG-UNIT per tablet Take 1 tablet by mouth daily.    Marland Kitchen esomeprazole (NEXIUM) 20 MG capsule Take by mouth 2 (two) times daily before a meal.     . loperamide (IMODIUM) 2 MG capsule Take 2 mg by mouth as needed for diarrhea or loose stools.    . mirtazapine (REMERON) 30 MG tablet Take 1 tablet (30 mg total) by mouth at bedtime. 30 tablet 2  . naproxen sodium (ANAPROX) 220 MG tablet Take 220 mg by mouth as  needed.    . prochlorperazine (COMPAZINE) 10 MG tablet Take 1 tablet (10 mg total) by mouth every 6 (six) hours as needed for nausea or vomiting. 30 tablet 1  . Silver Nitrate 10 % OINT Apply 1 application topically 2 (two) times daily. 10 g 0  . zolpidem (AMBIEN CR) 12.5 MG CR tablet Take 12.5 mg by mouth.     No current facility-administered medications for this encounter.    Physical Findings: The patient is in no acute distress. Patient is alert and oriented.  weight is 201 lb 6.4 oz (91.354 kg). His blood pressure is 122/81 and his pulse is 91. His respiration is 16. .  No significant  changes.  Lab Findings: Lab Results  Component Value Date   WBC 4.4 07/04/2014   HGB 13.9 07/04/2014   HCT 41.9 07/04/2014   MCV 88.0 07/04/2014   PLT 224 07/04/2014    @LASTCHEM @  Radiographic Findings: Oscar Floyd Contrast  07/18/2014   CLINICAL DATA:  48 year old male with metastatic lung cancer. Status post palliative whole brain radiation completed in July. Study for stereotactic treatment planning. Subsequent encounter.  EXAM: MRI HEAD WITHOUT AND WITH CONTRAST  TECHNIQUE: Multiplanar, multiecho pulse sequences of the brain and surrounding structures were obtained without and with intravenous contrast.  CONTRAST:  64m MULTIHANCE GADOBENATE DIMEGLUMINE 529 MG/ML IV SOLN  COMPARISON:  03/26/2014.  FINDINGS: Innumerable mostly punctate brain metastases on the comparison in July.  Today, there remain greater than 30 small enhancing metastases. The largest of these is 6 mm in the right temporal lobe (series 10, image 57). Numerous punctate enhancing metastases are re- identified, especially throughout the cerebellum.  No definite new metastasis.  No cerebral edema.  No intracranial mass effect.  Scattered bone metastases are re- identified, including in the clivus, right occipital condyle, and the upper cervical spine.  No restricted diffusion or evidence of acute infarction. No ventriculomegaly. No acute intracranial hemorrhage identified. Major intracranial vascular flow voids are stable. Negative pituitary and cervicomedullary junction. Grossly negative visualized cervical spinal cord. Visible internal auditory structures appear normal. Mild mastoid effusions are new. Mild paranasal sinus mucosal thickening is not significantly changed. Visualized orbit soft tissues are within normal limits. Visualized scalp soft tissues are within normal limits.  IMPRESSION: 1. Following whole brain radiation numerous (>30 ) small enhancing metastases remain visible. These are stable and most are punctate  (largest is 6 mm in the right temporal lobe). No definite new brain metastasis. No associated cerebral edema or mass effect. 2. No new intracranial abnormality. 3. Bone metastases re- identified including in the clivus and right occipital condyle.   Electronically Signed   By: LLars PinksM.D.   On: 07/18/2014 14:37    Impression:  The patient is recovering from the effects of radiation.  He has no evidence of intracranial tumor progression.  Plan:  MRI in 3 months reserving possible SRS for salvage in the event of brain metastasis recurrence.  _____________________________________  MSheral Apley MTammi Klippel M.D.

## 2014-07-22 ENCOUNTER — Ambulatory Visit
Admission: RE | Admit: 2014-07-22 | Discharge: 2014-07-22 | Disposition: A | Discharge status: Home / Self Care | Payer: BC Managed Care – PPO | Source: Ambulatory Visit | Attending: Radiation Oncology | Admitting: Radiation Oncology

## 2014-07-22 ENCOUNTER — Encounter: Payer: Self-pay | Admitting: Radiation Oncology

## 2014-07-22 VITALS — BP 122/81 | HR 91 | Resp 16 | Wt 201.4 lb

## 2014-07-22 DIAGNOSIS — C7931 Secondary malignant neoplasm of brain: Secondary | ICD-10-CM

## 2014-07-22 DIAGNOSIS — C7949 Secondary malignant neoplasm of other parts of nervous system: Principal | ICD-10-CM

## 2014-07-22 NOTE — Progress Notes (Addendum)
Continues to [employer] daily. Denies pain. Weight and vitals stable. Reports appetite is improving. Denies taking decadron in two months. Denies headache, dizziness, nausea or vomiting. Steady gait noted. Reports that remeron aids in sleep.Denies diplopia. Reports occasional ringing in the ears.

## 2014-07-25 ENCOUNTER — Other Ambulatory Visit: Payer: Self-pay | Admitting: Radiation Therapy

## 2014-07-25 DIAGNOSIS — C7931 Secondary malignant neoplasm of brain: Secondary | ICD-10-CM

## 2014-08-01 ENCOUNTER — Other Ambulatory Visit (HOSPITAL_BASED_OUTPATIENT_CLINIC_OR_DEPARTMENT_OTHER): Payer: BC Managed Care – PPO

## 2014-08-01 ENCOUNTER — Ambulatory Visit (HOSPITAL_BASED_OUTPATIENT_CLINIC_OR_DEPARTMENT_OTHER): Payer: BC Managed Care – PPO

## 2014-08-01 ENCOUNTER — Ambulatory Visit (HOSPITAL_BASED_OUTPATIENT_CLINIC_OR_DEPARTMENT_OTHER): Payer: BC Managed Care – PPO | Admitting: Internal Medicine

## 2014-08-01 ENCOUNTER — Encounter: Payer: Self-pay | Admitting: Internal Medicine

## 2014-08-01 ENCOUNTER — Telehealth: Payer: Self-pay | Admitting: Internal Medicine

## 2014-08-01 VITALS — BP 120/83 | HR 83 | Temp 98.5°F | Resp 18 | Ht 74.0 in | Wt 199.6 lb

## 2014-08-01 DIAGNOSIS — C787 Secondary malignant neoplasm of liver and intrahepatic bile duct: Secondary | ICD-10-CM

## 2014-08-01 DIAGNOSIS — C7931 Secondary malignant neoplasm of brain: Secondary | ICD-10-CM

## 2014-08-01 DIAGNOSIS — R634 Abnormal weight loss: Secondary | ICD-10-CM

## 2014-08-01 DIAGNOSIS — K521 Toxic gastroenteritis and colitis: Secondary | ICD-10-CM

## 2014-08-01 DIAGNOSIS — C7951 Secondary malignant neoplasm of bone: Secondary | ICD-10-CM

## 2014-08-01 DIAGNOSIS — C3411 Malignant neoplasm of upper lobe, right bronchus or lung: Secondary | ICD-10-CM

## 2014-08-01 DIAGNOSIS — C3400 Malignant neoplasm of unspecified main bronchus: Secondary | ICD-10-CM

## 2014-08-01 DIAGNOSIS — F329 Major depressive disorder, single episode, unspecified: Secondary | ICD-10-CM

## 2014-08-01 DIAGNOSIS — C3491 Malignant neoplasm of unspecified part of right bronchus or lung: Secondary | ICD-10-CM

## 2014-08-01 DIAGNOSIS — L270 Generalized skin eruption due to drugs and medicaments taken internally: Secondary | ICD-10-CM

## 2014-08-01 DIAGNOSIS — L03019 Cellulitis of unspecified finger: Secondary | ICD-10-CM

## 2014-08-01 LAB — CBC WITH DIFFERENTIAL/PLATELET
BASO%: 0.4 % (ref 0.0–2.0)
Basophils Absolute: 0 10*3/uL (ref 0.0–0.1)
EOS%: 2.5 % (ref 0.0–7.0)
Eosinophils Absolute: 0.1 10*3/uL (ref 0.0–0.5)
HEMATOCRIT: 42 % (ref 38.4–49.9)
HGB: 13.7 g/dL (ref 13.0–17.1)
LYMPH%: 17.9 % (ref 14.0–49.0)
MCH: 29 pg (ref 27.2–33.4)
MCHC: 32.6 g/dL (ref 32.0–36.0)
MCV: 89 fL (ref 79.3–98.0)
MONO#: 0.2 10*3/uL (ref 0.1–0.9)
MONO%: 5 % (ref 0.0–14.0)
NEUT#: 3.6 10*3/uL (ref 1.5–6.5)
NEUT%: 74.2 % (ref 39.0–75.0)
PLATELETS: 243 10*3/uL (ref 140–400)
RBC: 4.72 10*6/uL (ref 4.20–5.82)
RDW: 13.3 % (ref 11.0–14.6)
WBC: 4.8 10*3/uL (ref 4.0–10.3)
lymph#: 0.9 10*3/uL (ref 0.9–3.3)

## 2014-08-01 LAB — COMPREHENSIVE METABOLIC PANEL (CC13)
ALK PHOS: 116 U/L (ref 40–150)
ALT: 27 U/L (ref 0–55)
AST: 22 U/L (ref 5–34)
Albumin: 3.3 g/dL — ABNORMAL LOW (ref 3.5–5.0)
Anion Gap: 6 mEq/L (ref 3–11)
BILIRUBIN TOTAL: 0.6 mg/dL (ref 0.20–1.20)
BUN: 6.8 mg/dL — ABNORMAL LOW (ref 7.0–26.0)
CO2: 25 mEq/L (ref 22–29)
CREATININE: 0.9 mg/dL (ref 0.7–1.3)
Calcium: 8.7 mg/dL (ref 8.4–10.4)
Chloride: 109 mEq/L (ref 98–109)
Glucose: 118 mg/dl (ref 70–140)
Potassium: 4.1 mEq/L (ref 3.5–5.1)
Sodium: 141 mEq/L (ref 136–145)
TOTAL PROTEIN: 6.1 g/dL — AB (ref 6.4–8.3)

## 2014-08-01 MED ORDER — ALBUTEROL SULFATE (2.5 MG/3ML) 0.083% IN NEBU
2.5000 mg | INHALATION_SOLUTION | Freq: Once | RESPIRATORY_TRACT | Status: DC | PRN
  Filled 2014-08-01: qty 3

## 2014-08-01 MED ORDER — METHYLPREDNISOLONE SODIUM SUCC 125 MG IJ SOLR: 125.0000 mg | Freq: Once | INTRAMUSCULAR | Status: DC | PRN

## 2014-08-01 MED ORDER — DIPHENHYDRAMINE HCL 50 MG/ML IJ SOLN: 50.0000 mg | Freq: Once | INTRAMUSCULAR | Status: DC | PRN

## 2014-08-01 MED ORDER — DIPHENHYDRAMINE HCL 50 MG/ML IJ SOLN: 25.0000 mg | Freq: Once | INTRAMUSCULAR | Status: DC | PRN

## 2014-08-01 MED ORDER — SODIUM CHLORIDE 0.9 % IV SOLN: Freq: Once | INTRAVENOUS | Status: DC | PRN

## 2014-08-01 MED ORDER — DENOSUMAB 120 MG/1.7ML ~~LOC~~ SOLN
120.0000 mg | Freq: Once | SUBCUTANEOUS | Status: AC
  Administered 2014-08-01: 120 mg via SUBCUTANEOUS
  Filled 2014-08-01: qty 1.7

## 2014-08-01 MED ORDER — DOXYCYCLINE HYCLATE 100 MG PO TABS: 100.0000 mg | ORAL_TABLET | Freq: Two times a day (BID) | ORAL | Status: DC

## 2014-08-01 NOTE — Progress Notes (Signed)
[place] Telephone:([phone]) [phone]   [phone]  OFFICE PROGRESS NOTE  Oscar Inches, MD [address] [place] [address]  DIAGNOSIS: stage IV (T2a, N0, M1b) non-small cell lung cancer consistent with poorly differentiated adenocarcinoma with positive EGFR mutation with deletion in exon 19, diagnosed in July of 2015 and presented with right upper lobe lung mass in addition to extensive liver, brain and bone metastases.  Primary site: Lung (Right)  Staging method: AJCC 7th Edition  Clinical: Stage IV (T2a, N0, M1b) signed by [name], MD on 03/26/2014 4:57 PM  Summary: Stage IV (T2a, N0, M1b)   PRIOR THERAPY: Status post whole brain irradiation under the care Dr. Tammi Klippel completed 04/12/2014   CURRENT THERAPY:  1) Gilotrif 40 mg po daily - therapy beginning 04/03/2014. Status post approximately 2 months of therapy  2) Xgeva 120 mcg subcutaneously on monthly basis.   DISEASE STAGE:  Lung cancer  Primary site: Lung (Right)  Staging method: AJCC 7th Edition  Clinical: Stage IV (T2a, N0, M1b) signed by [name], MD on 03/26/2014 4:57 PM  Summary: Stage IV (T2a, N0, M1b)  CHEMOTHERAPY INTENT: palliative  CURRENT # OF CHEMOTHERAPY CYCLES: 3 CURRENT ANTIEMETICS: none  CURRENT SMOKING STATUS: non-smoker/never smoker  ORAL CHEMOTHERAPY AND CONSENT: yes- Gilotrif  CURRENT BISPHOSPHONATES USE: none  PAIN MANAGEMENT: none  NARCOTICS INDUCED CONSTIPATION: none  LIVING WILL AND CODE STATUS:   INTERVAL HISTORY: Oscar Floyd 48 y.o. male returns to the clinic today for followup visit. The patient is tolerating his treatment with Gilotrif fairly well with no significant adverse effects except for mild skin rash mainly on the nose and chest. His diarrhea has significantly improved. He also has some nail changes consistent with paronychia. He is applying Neosporin to the nails with some improvement. He is also applying silver nitrate swabs to  these areas. The patient denied having any significant chest pain, shortness of breath, cough or hemoptysis. He has no fever or chills, no nausea or vomiting. He noticed significant improvement in his general condition after starting treatment with Remeron.   MEDICAL HISTORY: Past Medical History  Diagnosis Date  . Lung cancer     RUL lung with mets to liver, bone and brain  . Hypertension     ALLERGIES:  has No Known Allergies.  MEDICATIONS:  Current Outpatient Prescriptions  Medication Sig Dispense Refill  . afatinib dimaleate (GILOTRIF) 40 MG tablet Take 1 tablet (40 mg total) by mouth daily. Take on an empty stomach 1hr before or 2 hrs after meals. 30 tablet 1  . calcium-vitamin D (OSCAL) 250-125 MG-UNIT per tablet Take 1 tablet by mouth daily.    Marland Kitchen esomeprazole (NEXIUM) 20 MG capsule Take by mouth 2 (two) times daily before a meal.     . loperamide (IMODIUM) 2 MG capsule Take 2 mg by mouth as needed for diarrhea or loose stools.    . mirtazapine (REMERON) 30 MG tablet Take 1 tablet (30 mg total) by mouth at bedtime. 30 tablet 2  . naproxen sodium (ANAPROX) 220 MG tablet Take 220 mg by mouth as needed.    . ALPRAZolam (XANAX) 1 MG tablet Take 1 mg by mouth daily as needed. Take as needed    . prochlorperazine (COMPAZINE) 10 MG tablet Take 1 tablet (10 mg total) by mouth every 6 (six) hours as needed for nausea or vomiting. 30 tablet 1  . Silver Nitrate 10 % OINT Apply 1 application topically 2 (two) times  daily. 10 g 0  . zolpidem (AMBIEN CR) 12.5 MG CR tablet Take 12.5 mg by mouth.     No current facility-administered medications for this visit.    SURGICAL HISTORY:  Past Surgical History  Procedure Laterality Date  . Vasectomy    . Video bronchoscopy Bilateral 03/20/2014    Procedure: VIDEO BRONCHOSCOPY WITH FLUORO;  Surgeon: Rigoberto Noel, MD;  Location: [facility];  Service: Cardiopulmonary;  Laterality: Bilateral;    REVIEW OF SYSTEMS:  Constitutional: negative Eyes:  negative Ears, nose, mouth, throat, and face: negative Respiratory: negative Cardiovascular: negative Gastrointestinal: negative Genitourinary:negative Integument/breast: positive for dryness and rash Hematologic/lymphatic: negative Musculoskeletal:negative Neurological: negative Behavioral/Psych: negative Endocrine: negative Allergic/Immunologic: negative   PHYSICAL EXAMINATION: General appearance: alert, cooperative and no distress Head: Normocephalic, without obvious abnormality, atraumatic Neck: no adenopathy, no JVD, supple, symmetrical, trachea midline and thyroid not enlarged, symmetric, no tenderness/mass/nodules Lymph nodes: Cervical, supraclavicular, and axillary nodes normal. Resp: clear to auscultation bilaterally Back: symmetric, no curvature. ROM normal. No CVA tenderness. Cardio: regular rate and rhythm, S1, S2 normal, no murmur, click, rub or gallop GI: soft, non-tender; bowel sounds normal; no masses,  no organomegaly Extremities: extremities normal, atraumatic, no cyanosis or edema Neurologic: Alert and oriented X 3, normal strength and tone. Normal symmetric reflexes. Normal coordination and gait  ECOG PERFORMANCE STATUS: 1 - Symptomatic but completely ambulatory  Blood pressure 120/83, pulse 83, temperature 98.5 F (36.9 C), temperature source Oral, resp. rate 18, height 6' 2"  (1.88 m), weight 199 lb 9.6 oz (90.538 kg), SpO2 100 %.  LABORATORY DATA: Lab Results  Component Value Date   WBC 4.8 08/01/2014   HGB 13.7 08/01/2014   HCT 42.0 08/01/2014   MCV 89.0 08/01/2014   PLT 243 08/01/2014      Chemistry      Component Value Date/Time   NA 140 07/04/2014 1009   K 4.6 07/04/2014 1009   CO2 26 07/04/2014 1009   BUN 5.9* 07/04/2014 1009   CREATININE 0.9 07/04/2014 1009      Component Value Date/Time   CALCIUM 8.8 07/04/2014 1009   ALKPHOS 132 07/04/2014 1009   AST 21 07/04/2014 1009   ALT 31 07/04/2014 1009   BILITOT 0.80 07/04/2014 1009        RADIOGRAPHIC STUDIES: Mr [name] Contrast  08/01/2014   CLINICAL DATA:  48 year old male with metastatic lung cancer. Status post palliative whole brain radiation completed in July. Study for stereotactic treatment planning. Subsequent encounter.  EXAM: MRI HEAD WITHOUT AND WITH CONTRAST  TECHNIQUE: Multiplanar, multiecho pulse sequences of the brain and surrounding structures were obtained without and with intravenous contrast.  CONTRAST:  30m MULTIHANCE GADOBENATE DIMEGLUMINE 529 MG/ML IV SOLN  COMPARISON:  03/26/2014.  FINDINGS: Innumerable mostly punctate brain metastases on the comparison in July.  Today, there remain greater than 30 small enhancing metastases. The largest of these is 6 mm in the right temporal lobe (series 10, image 57). Numerous punctate enhancing metastases are re- identified, especially throughout the cerebellum.  No definite new metastasis.  No cerebral edema.  No intracranial mass effect.  Scattered bone metastases are re- identified, including in the clivus, right occipital condyle, and the upper cervical spine.  No restricted diffusion or evidence of acute infarction. No ventriculomegaly. No acute intracranial hemorrhage identified. Major intracranial vascular flow voids are stable. Negative pituitary and cervicomedullary junction. Grossly negative visualized cervical spinal cord. Visible internal auditory structures appear normal. Mild mastoid effusions are new. Mild paranasal sinus mucosal thickening  is not significantly changed. Visualized orbit soft tissues are within normal limits. Visualized scalp soft tissues are within normal limits.  IMPRESSION: 1. Following whole brain radiation numerous (>30 ) small enhancing metastases remain visible. These are stable and most are punctate (largest is 6 mm in the right temporal lobe). No definite new brain metastasis. No associated cerebral edema or mass effect. 2. No new intracranial abnormality. 3. Bone metastases re-  identified including in the clivus and right occipital condyle.   Electronically Signed   By: Lars Pinks M.D.   On: 07/18/2014 14:37   ASSESSMENT AND PLAN: This is a very pleasant 48 years old white male with [name], adenocarcinoma with positive EGFR mutation in exon 19 currently undergoing treatment with oral Gilotrif 40 mg by mouth daily and tolerating his treatment fairly well. I recommended for him to continue his current treatment with Gilotrif at the same dose. For the metastatic bone disease, the patient will continue with monthly Xgeva. For the weight loss and depression, he is currently on Remeron 30 mg by mouth each bedtime and feels much better. For the paronychia, I will start the patient on doxycycline 100 mg by mouth twice a day. He will continue to apply the silver nitrate and Neosporin. He would come back for followup visit in one month with repeat CBC and comprehensive metabolic panel in addition to repeat CT scan of the chest, abdomen and pelvis. He was advised to call immediately if he has any concerning symptoms in the interval. The patient voices understanding of current disease status and treatment options and is in agreement with the current care plan.  All questions were answered. The patient knows to call the clinic with any problems, questions or concerns. We can certainly see the patient much sooner if necessary.  Disclaimer: This note was dictated with voice recognition software. Similar sounding words can inadvertently be transcribed and may not be corrected upon review.

## 2014-08-01 NOTE — Patient Instructions (Signed)
Denosumab injection What is this medicine? DENOSUMAB (den oh sue mab) slows bone breakdown. Prolia is used to treat osteoporosis in women after menopause and in men. Xgeva is used to prevent bone fractures and other bone problems caused by cancer bone metastases. Xgeva is also used to treat giant cell tumor of the bone. This medicine may be used for other purposes; ask your health care provider or pharmacist if you have questions. COMMON BRAND NAME(S): Prolia, XGEVA What should I tell my health care provider before I take this medicine? They need to know if you have any of these conditions: -dental disease -eczema -infection or history of infections -kidney disease or on dialysis -low blood calcium or vitamin D -malabsorption syndrome -scheduled to have surgery or tooth extraction -taking medicine that contains denosumab -thyroid or parathyroid disease -an unusual reaction to denosumab, other medicines, foods, dyes, or preservatives -pregnant or trying to get pregnant -breast-feeding How should I use this medicine? This medicine is for injection under the skin. It is given by a health care professional in a hospital or clinic setting. If you are getting Prolia, a special MedGuide will be given to you by the pharmacist with each prescription and refill. Be sure to read this information carefully each time. For Prolia, talk to your pediatrician regarding the use of this medicine in children. Special care may be needed. For Xgeva, talk to your pediatrician regarding the use of this medicine in children. While this drug may be prescribed for children as young as 13 years for selected conditions, precautions do apply. Overdosage: If you think you've taken too much of this medicine contact a poison control center or emergency room at once. Overdosage: If you think you have taken too much of this medicine contact a poison control center or emergency room at once. NOTE: This medicine is only for  you. Do not share this medicine with others. What if I miss a dose? It is important not to miss your dose. Call your doctor or health care professional if you are unable to keep an appointment. What may interact with this medicine? Do not take this medicine with any of the following medications: -other medicines containing denosumab This medicine may also interact with the following medications: -medicines that suppress the immune system -medicines that treat cancer -steroid medicines like prednisone or cortisone This list may not describe all possible interactions. Give your health care provider a list of all the medicines, herbs, non-prescription drugs, or dietary supplements you use. Also tell them if you smoke, drink alcohol, or use illegal drugs. Some items may interact with your medicine. What should I watch for while using this medicine? Visit your doctor or health care professional for regular checks on your progress. Your doctor or health care professional may order blood tests and other tests to see how you are doing. Call your doctor or health care professional if you get a cold or other infection while receiving this medicine. Do not treat yourself. This medicine may decrease your body's ability to fight infection. You should make sure you get enough calcium and vitamin D while you are taking this medicine, unless your doctor tells you not to. Discuss the foods you eat and the vitamins you take with your health care professional. See your dentist regularly. Brush and floss your teeth as directed. Before you have any dental work done, tell your dentist you are receiving this medicine. Do not become pregnant while taking this medicine or for 5 months after stopping   it. Women should inform their doctor if they wish to become pregnant or think they might be pregnant. There is a potential for serious side effects to an unborn child. Talk to your health care professional or pharmacist for more  information. What side effects may I notice from receiving this medicine? Side effects that you should report to your doctor or health care professional as soon as possible: -allergic reactions like skin rash, itching or hives, swelling of the face, lips, or tongue -breathing problems -chest pain -fast, irregular heartbeat -feeling faint or lightheaded, falls -fever, chills, or any other sign of infection -muscle spasms, tightening, or twitches -numbness or tingling -skin blisters or bumps, or is dry, peels, or red -slow healing or unexplained pain in the mouth or jaw -unusual bleeding or bruising Side effects that usually do not require medical attention (Report these to your doctor or health care professional if they continue or are bothersome.): -muscle pain -stomach upset, gas This list may not describe all possible side effects. Call your doctor for medical advice about side effects. You may report side effects to FDA at 1-800-FDA-1088. Where should I keep my medicine? This medicine is only given in a clinic, doctor's office, or other health care setting and will not be stored at home. NOTE: This sheet is a summary. It may not cover all possible information. If you have questions about this medicine, talk to your doctor, pharmacist, or health care provider.  2015, Elsevier/Gold Standard. (2012-02-28 12:37:47)  

## 2014-08-01 NOTE — Telephone Encounter (Signed)
Gave avs & cal for Dec. Also contrast given for CT scan.

## 2014-08-22 ENCOUNTER — Other Ambulatory Visit: Payer: Self-pay | Admitting: Medical Oncology

## 2014-08-22 DIAGNOSIS — C3490 Malignant neoplasm of unspecified part of unspecified bronchus or lung: Secondary | ICD-10-CM

## 2014-08-22 MED ORDER — AFATINIB DIMALEATE 40 MG PO TABS: 40.0000 mg | ORAL_TABLET | Freq: Every day | ORAL | Status: DC

## 2014-08-27 ENCOUNTER — Ambulatory Visit (HOSPITAL_COMMUNITY)
Admission: RE | Admit: 2014-08-27 | Discharge: 2014-08-27 | Disposition: A | Discharge status: Home / Self Care | Payer: BLUE CROSS/BLUE SHIELD | Source: Ambulatory Visit | Attending: Internal Medicine | Admitting: Internal Medicine

## 2014-08-27 ENCOUNTER — Other Ambulatory Visit (HOSPITAL_BASED_OUTPATIENT_CLINIC_OR_DEPARTMENT_OTHER): Payer: BC Managed Care – PPO

## 2014-08-27 ENCOUNTER — Encounter (HOSPITAL_COMMUNITY): Payer: Self-pay

## 2014-08-27 DIAGNOSIS — C3411 Malignant neoplasm of upper lobe, right bronchus or lung: Secondary | ICD-10-CM | POA: Insufficient documentation

## 2014-08-27 DIAGNOSIS — C3491 Malignant neoplasm of unspecified part of right bronchus or lung: Secondary | ICD-10-CM

## 2014-08-27 LAB — COMPREHENSIVE METABOLIC PANEL (CC13)
ALT: 39 U/L (ref 0–55)
AST: 26 U/L (ref 5–34)
Albumin: 3.6 g/dL (ref 3.5–5.0)
Alkaline Phosphatase: 101 U/L (ref 40–150)
Anion Gap: 9 mEq/L (ref 3–11)
BILIRUBIN TOTAL: 0.66 mg/dL (ref 0.20–1.20)
BUN: 7.7 mg/dL (ref 7.0–26.0)
CHLORIDE: 110 meq/L — AB (ref 98–109)
CO2: 24 mEq/L (ref 22–29)
Calcium: 8.3 mg/dL — ABNORMAL LOW (ref 8.4–10.4)
Creatinine: 0.8 mg/dL (ref 0.7–1.3)
EGFR: >90 mL/min/{1.73_m2} (ref >90)
Glucose: 88 mg/dl (ref 70–140)
Potassium: 4.1 mEq/L (ref 3.5–5.1)
SODIUM: 143 meq/L (ref 136–145)
TOTAL PROTEIN: 6.5 g/dL (ref 6.4–8.3)

## 2014-08-27 LAB — CBC WITH DIFFERENTIAL/PLATELET
BASO%: 0.9 % (ref 0.0–2.0)
Basophils Absolute: 0 10*3/uL (ref 0.0–0.1)
EOS%: 4.1 % (ref 0.0–7.0)
Eosinophils Absolute: 0.2 10*3/uL (ref 0.0–0.5)
HCT: 43.8 % (ref 38.4–49.9)
HGB: 14.1 g/dL (ref 13.0–17.1)
LYMPH%: 28 % (ref 14.0–49.0)
MCH: 28.6 pg (ref 27.2–33.4)
MCHC: 32.1 g/dL (ref 32.0–36.0)
MCV: 89 fL (ref 79.3–98.0)
MONO#: 0.3 10*3/uL (ref 0.1–0.9)
MONO%: 7.8 % (ref 0.0–14.0)
NEUT#: 2.2 10*3/uL (ref 1.5–6.5)
NEUT%: 59.2 % (ref 39.0–75.0)
Platelets: 214 10*3/uL (ref 140–400)
RBC: 4.93 10*6/uL (ref 4.20–5.82)
RDW: 13.8 % (ref 11.0–14.6)
WBC: 3.7 10*3/uL — AB (ref 4.0–10.3)
lymph#: 1 10*3/uL (ref 0.9–3.3)

## 2014-08-27 MED ORDER — IOHEXOL 300 MG/ML  SOLN
100.0000 mL | Freq: Once | INTRAMUSCULAR | Status: AC | PRN
  Administered 2014-08-27: 100 mL via INTRAVENOUS

## 2014-09-03 ENCOUNTER — Telehealth: Payer: Self-pay | Admitting: Internal Medicine

## 2014-09-03 ENCOUNTER — Other Ambulatory Visit: Payer: Self-pay | Admitting: Internal Medicine

## 2014-09-03 ENCOUNTER — Ambulatory Visit (HOSPITAL_BASED_OUTPATIENT_CLINIC_OR_DEPARTMENT_OTHER): Payer: BC Managed Care – PPO | Admitting: Internal Medicine

## 2014-09-03 ENCOUNTER — Encounter: Payer: Self-pay | Admitting: Internal Medicine

## 2014-09-03 VITALS — BP 132/92 | HR 73 | Temp 98.4°F | Resp 18 | Ht 74.0 in | Wt 197.3 lb

## 2014-09-03 DIAGNOSIS — C3491 Malignant neoplasm of unspecified part of right bronchus or lung: Secondary | ICD-10-CM

## 2014-09-03 DIAGNOSIS — C7951 Secondary malignant neoplasm of bone: Secondary | ICD-10-CM

## 2014-09-03 DIAGNOSIS — R634 Abnormal weight loss: Secondary | ICD-10-CM

## 2014-09-03 DIAGNOSIS — L03019 Cellulitis of unspecified finger: Secondary | ICD-10-CM

## 2014-09-03 DIAGNOSIS — F329 Major depressive disorder, single episode, unspecified: Secondary | ICD-10-CM

## 2014-09-03 MED ORDER — DOXYCYCLINE HYCLATE 100 MG PO TABS: 100.0000 mg | ORAL_TABLET | Freq: Two times a day (BID) | ORAL | Status: DC

## 2014-09-03 NOTE — Telephone Encounter (Signed)
Gave avs & cal for Jan.

## 2014-09-03 NOTE — Progress Notes (Signed)
[place] Telephone:([phone]   Fax:(336) [phone]  OFFICE PROGRESS NOTE  [name], MD [address] 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  Primary site: Lung (Right)  Staging method: AJCC 7th Edition  Clinical: Stage IV (T2a, N0, M1b) signed by [name], MD on 03/26/2014 4:57 PM  Summary: Stage IV (T2a, N0, M1b)   PRIOR THERAPY: Status post whole brain irradiation under the care Dr. Tammi Klippel completed 04/12/2014   CURRENT THERAPY:  1) Gilotrif 40 mg po daily - therapy beginning 04/03/2014. Status post approximately 5 months of therapy  2) Xgeva 120 mcg subcutaneously on monthly basis.   DISEASE STAGE:  Lung cancer  Primary site: Lung (Right)  Staging method: AJCC 7th Edition  Clinical: Stage IV (T2a, N0, M1b) signed by [name], MD on 03/26/2014 4:57 PM  Summary: Stage IV (T2a, N0, M1b)  CHEMOTHERAPY INTENT: palliative  CURRENT # OF CHEMOTHERAPY CYCLES: 6 CURRENT ANTIEMETICS: none  CURRENT SMOKING STATUS: non-smoker/never smoker  ORAL CHEMOTHERAPY AND CONSENT: yes- Gilotrif  CURRENT BISPHOSPHONATES USE: none  PAIN MANAGEMENT: none  NARCOTICS INDUCED CONSTIPATION: none  LIVING WILL AND CODE STATUS:   INTERVAL HISTORY: Oscar Floyd 48 y.o. male returns to the clinic today for followup visit. The patient is tolerating his treatment with Gilotrif fairly well with no significant adverse effects except for mild skin rash on the nose and chest and occasional episodes of diarrhea. His paronychia has improved after starting treatment with doxycycline and the patient requesting refill. He is also applying Neosporin to the nails with some improvement. The  patient denied having any significant chest pain, shortness of breath, cough or hemoptysis. He has no fever or chills, no nausea or vomiting. He noticed significant improvement in his general condition after starting treatment with Remeron. The patient had repeat CT scan of the chest, abdomen and pelvis performed recently and he is here for evaluation and discussion of his scan results.  MEDICAL HISTORY: Past Medical History  Diagnosis Date  . Lung cancer     RUL lung with mets to liver, bone and brain  . Hypertension     ALLERGIES:  has No Known Allergies.  MEDICATIONS:  Current Outpatient Prescriptions  Medication Sig Dispense Refill  . afatinib dimaleate (GILOTRIF) 40 MG tablet Take 1 tablet (40 mg total) by mouth daily. Take on an empty stomach 1hr before or 2 hrs after meals. 30 tablet 2  . ALPRAZolam (XANAX) 1 MG tablet Take 1 mg by mouth daily as needed. Take as needed    . calcium-vitamin D (OSCAL) 250-125 MG-UNIT per tablet Take 1 tablet by mouth daily.    Marland Kitchen esomeprazole (NEXIUM) 20 MG capsule Take by mouth 2 (two) times daily before a meal.     . loperamide (IMODIUM) 2 MG capsule Take 2 mg by mouth as needed for diarrhea or loose stools.    . mirtazapine (REMERON) 30 MG tablet Take 1 tablet (30 mg total) by mouth at bedtime. 30 tablet 2  . naproxen sodium (ANAPROX) 220 MG tablet Take 220 mg by mouth as needed.    . prochlorperazine (COMPAZINE) 10 MG tablet Take 1 tablet (10 mg total) by mouth every 6 (six) hours as needed for  nausea or vomiting. 30 tablet 1  . Silver Nitrate 10 % OINT Apply 1 application topically 2 (two) times daily. 10 g 0  . zolpidem (AMBIEN CR) 12.5 MG CR tablet Take 12.5 mg by mouth.     No current facility-administered medications for this visit.    SURGICAL HISTORY:  Past Surgical History  Procedure Laterality Date  . Vasectomy    . Video bronchoscopy Bilateral 03/20/2014    Procedure: VIDEO BRONCHOSCOPY WITH FLUORO;  Surgeon: Oretha Milch, MD;   Location: [facility];  Service: Cardiopulmonary;  Laterality: Bilateral;    REVIEW OF SYSTEMS:  Constitutional: negative Eyes: negative Ears, nose, mouth, throat, and face: negative Respiratory: negative Cardiovascular: negative Gastrointestinal: negative Genitourinary:negative Integument/breast: positive for dryness and rash Hematologic/lymphatic: negative Musculoskeletal:negative Neurological: negative Behavioral/Psych: negative Endocrine: negative Allergic/Immunologic: negative   PHYSICAL EXAMINATION: General appearance: alert, cooperative and no distress Head: Normocephalic, without obvious abnormality, atraumatic Neck: no adenopathy, no JVD, supple, symmetrical, trachea midline and thyroid not enlarged, symmetric, no tenderness/mass/nodules Lymph nodes: Cervical, supraclavicular, and axillary nodes normal. Resp: clear to auscultation bilaterally Back: symmetric, no curvature. ROM normal. No CVA tenderness. Cardio: regular rate and rhythm, S1, S2 normal, no murmur, click, rub or gallop GI: soft, non-tender; bowel sounds normal; no masses,  no organomegaly Extremities: extremities normal, atraumatic, no cyanosis or edema Neurologic: Alert and oriented X 3, normal strength and tone. Normal symmetric reflexes. Normal coordination and gait  ECOG PERFORMANCE STATUS: 1 - Symptomatic but completely ambulatory  Blood pressure 132/92, pulse 73, temperature 98.4 F (36.9 C), temperature source Oral, resp. rate 18, height 6\' 2"  (1.88 m), weight 197 lb 4.8 oz (89.495 kg), SpO2 100 %.  LABORATORY DATA: Lab Results  Component Value Date   WBC 3.7* 08/27/2014   HGB 14.1 08/27/2014   HCT 43.8 08/27/2014   MCV 89.0 08/27/2014   PLT 214 08/27/2014      Chemistry      Component Value Date/Time   NA 143 08/27/2014 1234   K 4.1 08/27/2014 1234   CO2 24 08/27/2014 1234   BUN 7.7 08/27/2014 1234   CREATININE 0.8 08/27/2014 1234      Component Value Date/Time   CALCIUM 8.3*  08/27/2014 1234   ALKPHOS 101 08/27/2014 1234   AST 26 08/27/2014 1234   ALT 39 08/27/2014 1234   BILITOT 0.66 08/27/2014 1234       RADIOGRAPHIC STUDIES: Ct Chest W Contrast  08/27/2014   CLINICAL DATA:  Metastatic non-small-cell right lung cancer. Restaging. Ongoing oral chemotherapy.  EXAM: CT CHEST, ABDOMEN, AND PELVIS WITH CONTRAST  TECHNIQUE: Multidetector CT imaging of the chest, abdomen and pelvis was performed following the standard protocol during bolus administration of intravenous contrast.  CONTRAST:  08/29/2014 OMNIPAQUE IOHEXOL 300 MG/ML  SOLN  COMPARISON:  05/17/2014  FINDINGS: CT CHEST FINDINGS  Heart is normal size. Aorta is normal caliber. No mediastinal, hilar, or axillary adenopathy.  No pleural effusions. The right upper lobe mass is decreased further in size, measuring 2.1 x 1.9 cm compared with 2.6 x 1.9 cm previously. Again noted are multiple small pulmonary nodules within both lungs, not significantly changed.  Extensive sclerotic lesions throughout the thoracic spine and sternum as well as bilateral ribs, unchanged.  CT ABDOMEN AND PELVIS FINDINGS  Liver, gallbladder, spleen, pancreas, adrenals and kidneys are normal.  Stomach, large and small bowel are unremarkable. No free fluid, free air or adenopathy. Urinary bladder is unremarkable. Aorta is normal caliber.  Extensive sclerotic bony lesions throughout the lumbar  spine and bony pelvis, essentially stable. There does appear to be a new central sacral sclerotic lesion noted on image 104 near the midline of the sacrum.  IMPRESSION: Further decrease in size of the right upper lobe mass. Numerous small scattered bilateral pulmonary nodules are centrally stable.  Sclerotic lesions noted throughout the bony skeleton are centrally stable. There is a new rounded sclerotic lesions centrally within the sacrum. It is unknown if this represents a new sclerotic metastasis or healed/treated lesion (favored).   Electronically Signed   By: Rolm Baptise M.D.   On: 08/27/2014 14:50   Ct Abdomen Pelvis W Contrast  08/27/2014   CLINICAL DATA:  Metastatic non-small-cell right lung cancer. Restaging. Ongoing oral chemotherapy.  EXAM: CT CHEST, ABDOMEN, AND PELVIS WITH CONTRAST  TECHNIQUE: Multidetector CT imaging of the chest, abdomen and pelvis was performed following the standard protocol during bolus administration of intravenous contrast.  CONTRAST:  133mL OMNIPAQUE IOHEXOL 300 MG/ML  SOLN  COMPARISON:  05/17/2014  FINDINGS: CT CHEST FINDINGS  Heart is normal size. Aorta is normal caliber. No mediastinal, hilar, or axillary adenopathy.  No pleural effusions. The right upper lobe mass is decreased further in size, measuring 2.1 x 1.9 cm compared with 2.6 x 1.9 cm previously. Again noted are multiple small pulmonary nodules within both lungs, not significantly changed.  Extensive sclerotic lesions throughout the thoracic spine and sternum as well as bilateral ribs, unchanged.  CT ABDOMEN AND PELVIS FINDINGS  Liver, gallbladder, spleen, pancreas, adrenals and kidneys are normal.  Stomach, large and small bowel are unremarkable. No free fluid, free air or adenopathy. Urinary bladder is unremarkable. Aorta is normal caliber.  Extensive sclerotic bony lesions throughout the lumbar spine and bony pelvis, essentially stable. There does appear to be a new central sacral sclerotic lesion noted on image 104 near the midline of the sacrum.  IMPRESSION: Further decrease in size of the right upper lobe mass. Numerous small scattered bilateral pulmonary nodules are centrally stable.  Sclerotic lesions noted throughout the bony skeleton are centrally stable. There is a new rounded sclerotic lesions centrally within the sacrum. It is unknown if this represents a new sclerotic metastasis or healed/treated lesion (favored).   Electronically Signed   By: Rolm Baptise M.D.   On: 08/27/2014 14:50   ASSESSMENT AND PLAN: This is a very pleasant 48 years old white male with  [name], adenocarcinoma with positive EGFR mutation in exon 19 currently undergoing treatment with oral Gilotrif 40 mg by mouth daily status post 5 months and tolerating his treatment fairly well. His recent CT scan of the chest, abdomen and pelvis showed further decrease in the right upper lobe mass and a stable disease otherwise. I discussed the scan results and showed the images to the patient today. I recommended for him to continue his current treatment with Gilotrif at the same dose. For the metastatic bone disease, the patient will continue with monthly Xgeva. For the weight loss and depression, he is currently on Remeron 30 mg by mouth each bedtime and feels much better. For the paronychia, I I will give the patient a refill of doxycycline 100 mg by mouth twice a day. He will continue to apply the silver nitrate and Neosporin. He would come back for followup visit in one month with repeat CBC and comprehensive metabolic panel. He was advised to call immediately if he has any concerning symptoms in the interval. The patient voices understanding of current disease status and  treatment options and is in agreement with the current care plan.  All questions were answered. The patient knows to call the clinic with any problems, questions or concerns. We can certainly see the patient much sooner if necessary.  Disclaimer: This note was dictated with voice recognition software. Similar sounding words can inadvertently be transcribed and may not be corrected upon review.

## 2014-09-12 ENCOUNTER — Telehealth: Payer: Self-pay | Admitting: *Deleted

## 2014-09-12 NOTE — Telephone Encounter (Signed)
Pt called requesting a refill on his doxycycline.  Per Dr Vista Mink, will not refill at this time to avoid being on continuous doxycycline.  Pt verbalized understanding

## 2014-09-25 ENCOUNTER — Other Ambulatory Visit: Payer: Self-pay | Admitting: Medical Oncology

## 2014-09-25 DIAGNOSIS — C3491 Malignant neoplasm of unspecified part of right bronchus or lung: Secondary | ICD-10-CM

## 2014-09-25 DIAGNOSIS — G4700 Insomnia, unspecified: Secondary | ICD-10-CM

## 2014-09-25 MED ORDER — MIRTAZAPINE 30 MG PO TABS: 30.0000 mg | ORAL_TABLET | Freq: Every day | ORAL | Status: DC

## 2014-09-25 NOTE — Progress Notes (Signed)
Pt called requesting refill for remeron .-rx sent.

## 2014-10-01 ENCOUNTER — Telehealth: Payer: Self-pay | Admitting: Internal Medicine

## 2014-10-01 ENCOUNTER — Ambulatory Visit (HOSPITAL_BASED_OUTPATIENT_CLINIC_OR_DEPARTMENT_OTHER): Payer: BLUE CROSS/BLUE SHIELD

## 2014-10-01 ENCOUNTER — Encounter: Payer: Self-pay | Admitting: Internal Medicine

## 2014-10-01 ENCOUNTER — Ambulatory Visit (HOSPITAL_BASED_OUTPATIENT_CLINIC_OR_DEPARTMENT_OTHER): Payer: BLUE CROSS/BLUE SHIELD | Admitting: Internal Medicine

## 2014-10-01 ENCOUNTER — Other Ambulatory Visit (HOSPITAL_BASED_OUTPATIENT_CLINIC_OR_DEPARTMENT_OTHER): Payer: BLUE CROSS/BLUE SHIELD

## 2014-10-01 VITALS — BP 127/86 | HR 79 | Temp 97.8°F | Resp 18 | Ht 74.0 in | Wt 197.5 lb

## 2014-10-01 DIAGNOSIS — C3411 Malignant neoplasm of upper lobe, right bronchus or lung: Secondary | ICD-10-CM

## 2014-10-01 DIAGNOSIS — C7931 Secondary malignant neoplasm of brain: Secondary | ICD-10-CM

## 2014-10-01 DIAGNOSIS — C3491 Malignant neoplasm of unspecified part of right bronchus or lung: Secondary | ICD-10-CM

## 2014-10-01 DIAGNOSIS — C787 Secondary malignant neoplasm of liver and intrahepatic bile duct: Secondary | ICD-10-CM

## 2014-10-01 DIAGNOSIS — L03019 Cellulitis of unspecified finger: Secondary | ICD-10-CM

## 2014-10-01 DIAGNOSIS — C7951 Secondary malignant neoplasm of bone: Secondary | ICD-10-CM

## 2014-10-01 DIAGNOSIS — C3400 Malignant neoplasm of unspecified main bronchus: Secondary | ICD-10-CM

## 2014-10-01 LAB — CBC WITH DIFFERENTIAL/PLATELET
BASO%: 0.2 % (ref 0.0–2.0)
Basophils Absolute: 0 10*3/uL (ref 0.0–0.1)
EOS%: 2.2 % (ref 0.0–7.0)
Eosinophils Absolute: 0.1 10*3/uL (ref 0.0–0.5)
HCT: 44.7 % (ref 38.4–49.9)
HGB: 14.9 g/dL (ref 13.0–17.1)
LYMPH#: 1 10*3/uL (ref 0.9–3.3)
LYMPH%: 24.2 % (ref 14.0–49.0)
MCH: 29.2 pg (ref 27.2–33.4)
MCHC: 33.3 g/dL (ref 32.0–36.0)
MCV: 87.5 fL (ref 79.3–98.0)
MONO#: 0.3 10*3/uL (ref 0.1–0.9)
MONO%: 7.7 % (ref 0.0–14.0)
NEUT%: 65.7 % (ref 39.0–75.0)
NEUTROS ABS: 2.7 10*3/uL (ref 1.5–6.5)
Platelets: 211 10*3/uL (ref 140–400)
RBC: 5.11 10*6/uL (ref 4.20–5.82)
RDW: 14 % (ref 11.0–14.6)
WBC: 4.1 10*3/uL (ref 4.0–10.3)

## 2014-10-01 LAB — COMPREHENSIVE METABOLIC PANEL (CC13)
ALT: 30 U/L (ref 0–55)
ANION GAP: 7 meq/L (ref 3–11)
AST: 25 U/L (ref 5–34)
Albumin: 3.8 g/dL (ref 3.5–5.0)
Alkaline Phosphatase: 94 U/L (ref 40–150)
BUN: 9.7 mg/dL (ref 7.0–26.0)
CO2: 24 mEq/L (ref 22–29)
CREATININE: 0.9 mg/dL (ref 0.7–1.3)
Calcium: 8 mg/dL — ABNORMAL LOW (ref 8.4–10.4)
Chloride: 109 mEq/L (ref 98–109)
EGFR: >90 mL/min/{1.73_m2} (ref >90)
GLUCOSE: 105 mg/dL (ref 70–140)
POTASSIUM: 4.1 meq/L (ref 3.5–5.1)
Sodium: 141 mEq/L (ref 136–145)
TOTAL PROTEIN: 6.6 g/dL (ref 6.4–8.3)
Total Bilirubin: 0.68 mg/dL (ref 0.20–1.20)

## 2014-10-01 MED ORDER — DENOSUMAB 120 MG/1.7ML ~~LOC~~ SOLN
120.0000 mg | Freq: Once | SUBCUTANEOUS | Status: AC
  Administered 2014-10-01: 120 mg via SUBCUTANEOUS
  Filled 2014-10-01: qty 1.7

## 2014-10-01 MED ORDER — DOXYCYCLINE HYCLATE 100 MG PO TABS: 100.0000 mg | ORAL_TABLET | Freq: Two times a day (BID) | ORAL | Status: DC

## 2014-10-01 NOTE — Progress Notes (Signed)
Patient's Oscar Floyd id #  Is [id number] effective 0/2/21 per Lelon Frohlich this policy doesn't require precert for T9810 (xgeva) but may 03/14/2015

## 2014-10-01 NOTE — Progress Notes (Signed)
[place] Telephone:([phone]   Fax:(336) [phone]  OFFICE PROGRESS NOTE  [name], MD [address] [place] [address]  DIAGNOSIS: stage IV (T2a, N0, M1b) non-small cell lung cancer consistent with poorly differentiated adenocarcinoma with positive EGFR mutation with deletion in exon 19, diagnosed in July of 2015 and presented with right upper lobe lung mass in addition to extensive liver, brain and bone metastases.  Primary site: Lung (Right)  Staging method: AJCC 7th Edition  Clinical: Stage IV (T2a, N0, M1b) signed by [name], MD on 03/26/2014 4:57 PM  Summary: Stage IV (T2a, N0, M1b)   PRIOR THERAPY: Status post whole brain irradiation under the care Dr. Tammi Klippel completed 04/12/2014   CURRENT THERAPY:  1) Gilotrif 40 mg po daily - therapy beginning 04/03/2014. Status post approximately 6 months of therapy  2) Xgeva 120 mcg subcutaneously on monthly basis.   DISEASE STAGE:  Lung cancer  Primary site: Lung (Right)  Staging method: AJCC 7th Edition  Clinical: Stage IV (T2a, N0, M1b) signed by [name], MD on 03/26/2014 4:57 PM  Summary: Stage IV (T2a, N0, M1b)  CHEMOTHERAPY INTENT: palliative  CURRENT # OF CHEMOTHERAPY CYCLES: 7 CURRENT ANTIEMETICS: none  CURRENT SMOKING STATUS: non-smoker/never smoker  ORAL CHEMOTHERAPY AND CONSENT: yes- Gilotrif  CURRENT BISPHOSPHONATES USE: none  PAIN MANAGEMENT: none  NARCOTICS INDUCED CONSTIPATION: none  LIVING WILL AND CODE STATUS:   INTERVAL HISTORY: Oscar Floyd 49 y.o. male returns to the clinic today for followup visit. The patient is tolerating his treatment with Gilotrif fairly well with no significant adverse effects except for mild skin rash on the nose and chest and occasional episodes of diarrhea. His paronychia improves when he takes doxycycline but he has not done this for almost a month. He is requesting refill of doxycycline. He is not  willing to reduce the dose of Gilotrif and mentioned that he can deal with the paronychia as long as his cancer is under control. He is also applying Neosporin to the nails with some improvement. The patient denied having any significant chest pain, shortness of breath, cough or hemoptysis. He has no fever or chills, no nausea or vomiting. He noticed significant improvement in his general condition after starting treatment with Remeron.   MEDICAL HISTORY: Past Medical History  Diagnosis Date  . Lung cancer     RUL lung with mets to liver, bone and brain  . Hypertension     ALLERGIES:  has No Known Allergies.  MEDICATIONS:  Current Outpatient Prescriptions  Medication Sig Dispense Refill  . afatinib dimaleate (GILOTRIF) 40 MG tablet Take 1 tablet (40 mg total) by mouth daily. Take on an empty stomach 1hr before or 2 hrs after meals. 30 tablet 2  . calcium-vitamin D (OSCAL) 250-125 MG-UNIT per tablet Take 1 tablet by mouth daily.    Marland Kitchen esomeprazole (NEXIUM) 20 MG capsule Take by mouth 2 (two) times daily before a meal.     . loperamide (IMODIUM) 2 MG capsule Take 2 mg by mouth as needed for diarrhea or loose stools.    . mirtazapine (REMERON) 30 MG tablet Take 1 tablet (30 mg total) by mouth at bedtime. 30 tablet 2  . naproxen sodium (ANAPROX) 220 MG tablet Take 220 mg by mouth as needed.    Cathie Olden Nitrate 10 % OINT Apply 1 application topically 2 (two) times daily. 10 g 0   No current facility-administered medications for  this visit.    SURGICAL HISTORY:  Past Surgical History  Procedure Laterality Date  . Vasectomy    . Video bronchoscopy Bilateral 03/20/2014    Procedure: VIDEO BRONCHOSCOPY WITH FLUORO;  Surgeon: Rigoberto Noel, MD;  Location: [facility];  Service: Cardiopulmonary;  Laterality: Bilateral;    REVIEW OF SYSTEMS:  Constitutional: negative Eyes: negative Ears, nose, mouth, throat, and face: negative Respiratory: negative Cardiovascular: negative Gastrointestinal:  negative Genitourinary:negative Integument/breast: positive for dryness and rash Hematologic/lymphatic: negative Musculoskeletal:negative Neurological: negative Behavioral/Psych: negative Endocrine: negative Allergic/Immunologic: negative   PHYSICAL EXAMINATION: General appearance: alert, cooperative and no distress Head: Normocephalic, without obvious abnormality, atraumatic Neck: no adenopathy, no JVD, supple, symmetrical, trachea midline and thyroid not enlarged, symmetric, no tenderness/mass/nodules Lymph nodes: Cervical, supraclavicular, and axillary nodes normal. Resp: clear to auscultation bilaterally Back: symmetric, no curvature. ROM normal. No CVA tenderness. Cardio: regular rate and rhythm, S1, S2 normal, no murmur, click, rub or gallop GI: soft, non-tender; bowel sounds normal; no masses,  no organomegaly Extremities: extremities normal, atraumatic, no cyanosis or edema Neurologic: Alert and oriented X 3, normal strength and tone. Normal symmetric reflexes. Normal coordination and gait  ECOG PERFORMANCE STATUS: 1 - Symptomatic but completely ambulatory  Blood pressure 127/86, pulse 79, temperature 97.8 F (36.6 C), temperature source Oral, resp. rate 18, height $RemoveBe'6\' 2"'ILQlRMxQx$  (1.88 m), weight 197 lb 8 oz (89.585 kg), SpO2 100 %.  LABORATORY DATA: Lab Results  Component Value Date   WBC 4.1 10/01/2014   HGB 14.9 10/01/2014   HCT 44.7 10/01/2014   MCV 87.5 10/01/2014   PLT 211 10/01/2014      Chemistry      Component Value Date/Time   NA 141 10/01/2014 0813   K 4.1 10/01/2014 0813   CO2 24 10/01/2014 0813   BUN 9.7 10/01/2014 0813   CREATININE 0.9 10/01/2014 0813      Component Value Date/Time   CALCIUM 8.0* 10/01/2014 0813   ALKPHOS 94 10/01/2014 0813   AST 25 10/01/2014 0813   ALT 30 10/01/2014 0813   BILITOT 0.68 10/01/2014 0813       RADIOGRAPHIC STUDIES: No results found. ASSESSMENT AND PLAN: This is a very pleasant 49 years old white male with stage IV  non-small cell lung cancer, adenocarcinoma with positive EGFR mutation in exon 19 currently undergoing treatment with oral Gilotrif 40 mg by mouth daily status post 6 months and tolerating his treatment fairly well. So far he has no evidence for disease progression. I recommended for him to continue his current treatment with Gilotrif at the same dose as he refused to decrease the dose to 70 mg because of the paronychia. For the metastatic bone disease, the patient will continue with monthly Xgeva. I also recommended for the patient to increase his calcium and vitamin D intake because of the hypocalcemia noted in his recent blood work. For the weight loss and depression, he is currently on Remeron 30 mg by mouth each bedtime and feels much better. For the paronychia, I I will give the patient a refill of doxycycline 100 mg by mouth twice a day. He will continue to apply the silver nitrate and Neosporin. He would come back for followup visit in one month with repeat CBC and comprehensive metabolic panel. He was advised to call immediately if he has any concerning symptoms in the interval. The patient voices understanding of current disease status and treatment options and is in agreement with the current care plan.  All questions were answered. The  patient knows to call the clinic with any problems, questions or concerns. We can certainly see the patient much sooner if necessary.  Disclaimer: This note was dictated with voice recognition software. Similar sounding words can inadvertently be transcribed and may not be corrected upon review.       

## 2014-10-01 NOTE — Telephone Encounter (Signed)
Gave avs & calendar for February. °

## 2014-10-25 ENCOUNTER — Ambulatory Visit
Admission: RE | Admit: 2014-10-25 | Discharge: 2014-10-25 | Disposition: A | Discharge status: Home / Self Care | Payer: BLUE CROSS/BLUE SHIELD | Source: Ambulatory Visit | Attending: Radiation Oncology | Admitting: Radiation Oncology

## 2014-10-25 DIAGNOSIS — C7931 Secondary malignant neoplasm of brain: Secondary | ICD-10-CM

## 2014-10-25 MED ORDER — GADOBENATE DIMEGLUMINE 529 MG/ML IV SOLN
18.0000 mL | Freq: Once | INTRAVENOUS | Status: AC | PRN
  Administered 2014-10-25: 18 mL via INTRAVENOUS

## 2014-10-27 NOTE — Progress Notes (Signed)
Radiation Oncology         [phone]   Name: Oscar Floyd   Date: 10/28/2014   MRN: [id number]  DOB: August 03, 1966    Multidisciplinary [facility] Spine Oncology Clinic Follow-Up Visit Note  CC: [name], MD  [name], MD    ICD-9-CM ICD-10-CM   1. Numerous sub-centimeter brain metastases 198.3 C79.31     Diagnosis:   49 year old gentleman with numerous subcentimeter brain metastases from EGFR positive adenocarcinoma the lung status post whole brain irradiation 04/01/2014-04/12/2014 to 30 Gy in 10 fractions of 3 Gy  Interval Since Last Radiation:  7 months  Narrative:  The patient returns today for routine follow-up.  The recent films were presented in our multidisciplinary conference with neuroradiology just prior to the clinic.  Continues to take Gilotrif daily. Reports associated side effects are diarrhea and sores on finger tips and toes. Reports occasional headaches. Denies nausea, vomiting, dizziness, diplopia or ringing in the ears. Denies taking steroids at this time. Reports Remeron has been very helpful. Denies confusion or generalized weakness. Continues to work full time. Weight and vitals stable. Denies pain. Reports middle back pain when standing long periods of time                              ALLERGIES:  has No Known Allergies.  Meds: Current Outpatient Prescriptions  Medication Sig Dispense Refill  . calcium-vitamin D (OSCAL) 250-125 MG-UNIT per tablet Take 1 tablet by mouth daily.    Marland Kitchen esomeprazole (NEXIUM) 20 MG capsule Take by mouth 2 (two) times daily before a meal.     . loperamide (IMODIUM) 2 MG capsule Take 2 mg by mouth as needed for diarrhea or loose stools.    . mirtazapine (REMERON) 30 MG tablet Take 1 tablet (30 mg total) by mouth at bedtime. 30 tablet 2  . naproxen sodium (ANAPROX) 220 MG tablet Take 220 mg by mouth as needed.    Marland Kitchen afatinib dimaleate (GILOTRIF) 40 MG tablet Take 1 tablet (40 mg total) by mouth daily. Take on an empty stomach  1hr before or 2 hrs after meals. 30 tablet 2  . ALPRAZolam (XANAX) 1 MG tablet     . doxycycline (VIBRA-TABS) 100 MG tablet Take 1 tablet (100 mg total) by mouth 2 (two) times daily. (Patient not taking: Reported on 10/28/2014) 20 tablet 0   No current facility-administered medications for this encounter.    Physical Findings: The patient is in no acute distress. Patient is alert and oriented.  weight is 198 lb 9.6 oz (90.084 kg). His blood pressure is 117/82 and his pulse is 78. His respiration is 16. .  No significant changes.  Lab Findings: Lab Results  Component Value Date   WBC 4.1 10/01/2014   HGB 14.9 10/01/2014   HCT 44.7 10/01/2014   MCV 87.5 10/01/2014   PLT 211 10/01/2014    @LASTCHEM @  Radiographic Findings: Mr [name] NG Contrast  10/25/2014   CLINICAL DATA:  Metastatic lung cancer.  S RS restaging 3 months.  EXAM: MRI HEAD WITHOUT AND WITH CONTRAST  TECHNIQUE: Multiplanar, multiecho pulse sequences of the brain and surrounding structures were obtained without and with intravenous contrast.  CONTRAST:  61m MULTIHANCE GADOBENATE DIMEGLUMINE 529 MG/ML IV SOLN  COMPARISON:  07/18/2014.  03/26/2014.  FINDINGS: The study shows considerable and continued positive response to therapy. There are no foci of restricted diffusion. There is no brain edema, mass  effect or shift. Previously seen innumerable punctate foci of contrast enhancement throughout the cerebellum and the cerebral hemispheres are considerably diminished. Most are not visible at all. There are a few punctate foci in the cerebellum and the cerebral hemispheres visible as very low level enhancing foci. None of these are enlarged, brightly enhancing, or associated with edema. No new lesion is seen. Chronic marrow change in the clivus is again noted, also probably contracted consistent with previous treatment. No new bone lesion.  No hydrocephalus or extra-axial collection. No sign of ischemic infarction. There is some  mucosal inflammatory change of the maxillary sinuses left more than right.  IMPRESSION: Pronounced favorable response to therapy. Previously seen innumerable enhancing brain foci are now invisible. There are a few punctate foci still barely visible in the cerebellum and cerebral hemispheres, but no new or progressive disease.   Electronically Signed   By: Nelson Chimes M.D.   On: 10/25/2014 10:35    Impression:  The patient is stable with no evidence of progressive or recurrent brain metastases at this time.  Plan:  MRI and follow-up in 3 months.  _____________________________________  Sheral Apley. Tammi Klippel, M.D.

## 2014-10-28 ENCOUNTER — Ambulatory Visit
Admission: RE | Admit: 2014-10-28 | Discharge: 2014-10-28 | Disposition: A | Discharge status: Home / Self Care | Payer: BLUE CROSS/BLUE SHIELD | Source: Ambulatory Visit | Attending: Radiation Oncology | Admitting: Radiation Oncology

## 2014-10-28 ENCOUNTER — Encounter: Payer: Self-pay | Admitting: Radiation Oncology

## 2014-10-28 VITALS — BP 117/82 | HR 78 | Resp 16 | Wt 198.6 lb

## 2014-10-28 DIAGNOSIS — C7931 Secondary malignant neoplasm of brain: Secondary | ICD-10-CM

## 2014-10-28 DIAGNOSIS — C7949 Secondary malignant neoplasm of other parts of nervous system: Principal | ICD-10-CM

## 2014-10-28 NOTE — Progress Notes (Addendum)
Continues to take Gilotrif daily. Reports associated side effects are diarrhea and sores on finger tips and toes. Reports occasional headaches. Denies nausea, vomiting, dizziness, diplopia or ringing in the ears. Denies taking steroids at this time. Reports Remeron has been very helpful. Denies confusion or generalized weakness. Continues to work full time. Weight and vitals stable. Denies pain. Reports middle back pain when standing long periods of time.

## 2014-10-29 ENCOUNTER — Ambulatory Visit (HOSPITAL_BASED_OUTPATIENT_CLINIC_OR_DEPARTMENT_OTHER): Payer: BLUE CROSS/BLUE SHIELD

## 2014-10-29 ENCOUNTER — Encounter: Payer: Self-pay | Admitting: *Deleted

## 2014-10-29 ENCOUNTER — Encounter: Payer: Self-pay | Admitting: Internal Medicine

## 2014-10-29 ENCOUNTER — Ambulatory Visit (HOSPITAL_BASED_OUTPATIENT_CLINIC_OR_DEPARTMENT_OTHER): Payer: BLUE CROSS/BLUE SHIELD | Admitting: Internal Medicine

## 2014-10-29 ENCOUNTER — Other Ambulatory Visit (HOSPITAL_BASED_OUTPATIENT_CLINIC_OR_DEPARTMENT_OTHER): Payer: BLUE CROSS/BLUE SHIELD

## 2014-10-29 ENCOUNTER — Telehealth: Payer: Self-pay | Admitting: Internal Medicine

## 2014-10-29 VITALS — BP 135/88 | HR 74 | Temp 98.3°F | Resp 19 | Ht 74.0 in | Wt 199.5 lb

## 2014-10-29 DIAGNOSIS — C3491 Malignant neoplasm of unspecified part of right bronchus or lung: Secondary | ICD-10-CM

## 2014-10-29 DIAGNOSIS — C3411 Malignant neoplasm of upper lobe, right bronchus or lung: Secondary | ICD-10-CM

## 2014-10-29 DIAGNOSIS — F329 Major depressive disorder, single episode, unspecified: Secondary | ICD-10-CM

## 2014-10-29 DIAGNOSIS — C7951 Secondary malignant neoplasm of bone: Secondary | ICD-10-CM

## 2014-10-29 DIAGNOSIS — R197 Diarrhea, unspecified: Secondary | ICD-10-CM

## 2014-10-29 DIAGNOSIS — R21 Rash and other nonspecific skin eruption: Secondary | ICD-10-CM

## 2014-10-29 DIAGNOSIS — L03039 Cellulitis of unspecified toe: Secondary | ICD-10-CM

## 2014-10-29 DIAGNOSIS — C3400 Malignant neoplasm of unspecified main bronchus: Secondary | ICD-10-CM

## 2014-10-29 LAB — CBC WITH DIFFERENTIAL/PLATELET
BASO%: 0.2 % (ref 0.0–2.0)
BASOS ABS: 0 10*3/uL (ref 0.0–0.1)
EOS%: 1.2 % (ref 0.0–7.0)
Eosinophils Absolute: 0.1 10*3/uL (ref 0.0–0.5)
HEMATOCRIT: 43.6 % (ref 38.4–49.9)
HGB: 14.9 g/dL (ref 13.0–17.1)
LYMPH#: 1.2 10*3/uL (ref 0.9–3.3)
LYMPH%: 20.7 % (ref 14.0–49.0)
MCH: 30.2 pg (ref 27.2–33.4)
MCHC: 34.2 g/dL (ref 32.0–36.0)
MCV: 88.3 fL (ref 79.3–98.0)
MONO#: 0.5 10*3/uL (ref 0.1–0.9)
MONO%: 8 % (ref 0.0–14.0)
NEUT#: 4.2 10*3/uL (ref 1.5–6.5)
NEUT%: 69.9 % (ref 39.0–75.0)
Platelets: 222 10*3/uL (ref 140–400)
RBC: 4.94 10*6/uL (ref 4.20–5.82)
RDW: 14 % (ref 11.0–14.6)
WBC: 6 10*3/uL (ref 4.0–10.3)

## 2014-10-29 LAB — COMPREHENSIVE METABOLIC PANEL (CC13)
ALBUMIN: 3.8 g/dL (ref 3.5–5.0)
ALT: 32 U/L (ref 0–55)
AST: 25 U/L (ref 5–34)
Alkaline Phosphatase: 89 U/L (ref 40–150)
Anion Gap: 9 mEq/L (ref 3–11)
BILIRUBIN TOTAL: 0.8 mg/dL (ref 0.20–1.20)
BUN: 10.3 mg/dL (ref 7.0–26.0)
CO2: 27 mEq/L (ref 22–29)
Calcium: 9.6 mg/dL (ref 8.4–10.4)
Chloride: 107 mEq/L (ref 98–109)
Creatinine: 0.9 mg/dL (ref 0.7–1.3)
EGFR: >90 mL/min/{1.73_m2} (ref >90)
GLUCOSE: 90 mg/dL (ref 70–140)
POTASSIUM: 4.2 meq/L (ref 3.5–5.1)
SODIUM: 142 meq/L (ref 136–145)
Total Protein: 6.7 g/dL (ref 6.4–8.3)

## 2014-10-29 MED ORDER — DENOSUMAB 120 MG/1.7ML ~~LOC~~ SOLN
120.0000 mg | Freq: Once | SUBCUTANEOUS | Status: AC
  Administered 2014-10-29: 120 mg via SUBCUTANEOUS
  Filled 2014-10-29: qty 1.7

## 2014-10-29 NOTE — Telephone Encounter (Signed)
Gave avs & calendar for March. Gave ct contrast for ct scan.

## 2014-10-29 NOTE — Progress Notes (Signed)
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  Primary site: Lung (Right)  Staging method: AJCC 7th Edition  Clinical: Stage IV (T2a, N0, M1b) signed by [name], MD on 03/26/2014 4:57 PM  Summary: Stage IV (T2a, N0, M1b)   PRIOR THERAPY: Status post whole brain irradiation under the care Dr. Tammi Klippel completed 04/12/2014   CURRENT THERAPY:  1) Gilotrif 40 mg po daily - therapy beginning 04/03/2014. Status post approximately 7 months of therapy  2) Xgeva 120 mcg subcutaneously on monthly basis.   DISEASE STAGE:  Lung cancer  Primary site: Lung (Right)  Staging method: AJCC 7th Edition  Clinical: Stage IV (T2a, N0, M1b) signed by [name], MD on 03/26/2014 4:57 PM  Summary: Stage IV (T2a, N0, M1b)  CHEMOTHERAPY INTENT: palliative  CURRENT # OF CHEMOTHERAPY CYCLES: 8 CURRENT ANTIEMETICS: none  CURRENT SMOKING STATUS: non-smoker/never smoker  ORAL CHEMOTHERAPY AND CONSENT: yes- Gilotrif  CURRENT BISPHOSPHONATES USE: none  PAIN MANAGEMENT: none  NARCOTICS INDUCED CONSTIPATION: none  LIVING WILL AND CODE STATUS:   INTERVAL HISTORY: Oscar Floyd 49 y.o. male returns to the clinic today for followup visit. The patient is tolerating his treatment with Gilotrif fairly well with no significant adverse effects except for persistent mild skin rash on the nose and chest and occasional episodes of diarrhea. He continues to have paronychia mainly on the big toe. It mildly improves when he takes doxycycline. He is also applying Neosporin to the  nails with some improvement. The patient denied having any significant chest pain, shortness of breath, cough or hemoptysis. He has no fever or chills, no nausea or vomiting. His recent MRI of the brain showed improvement in his disease in the brain.  MEDICAL HISTORY: Past Medical History  Diagnosis Date  . Lung cancer     RUL lung with mets to liver, bone and brain  . Hypertension     ALLERGIES:  has No Known Allergies.  MEDICATIONS:  Current Outpatient Prescriptions  Medication Sig Dispense Refill  . afatinib dimaleate (GILOTRIF) 40 MG tablet Take 1 tablet (40 mg total) by mouth daily. Take on an empty stomach 1hr before or 2 hrs after meals. 30 tablet 2  . calcium-vitamin D (OSCAL) 250-125 MG-UNIT per tablet Take 1 tablet by mouth daily.    Marland Kitchen esomeprazole (NEXIUM) 20 MG capsule Take by mouth 2 (two) times daily before a meal.     . loperamide (IMODIUM) 2 MG capsule Take 2 mg by mouth as needed for diarrhea or loose stools.    . mirtazapine (REMERON) 30 MG tablet Take 1 tablet (30 mg total) by mouth at bedtime. 30 tablet 2  . naproxen sodium (ANAPROX) 220 MG tablet Take 220 mg by mouth as needed.    . ALPRAZolam (XANAX) 1 MG tablet     . doxycycline (VIBRA-TABS) 100 MG tablet Take 1 tablet (100 mg total) by mouth 2 (two) times daily. (Patient not taking: Reported on 10/28/2014) 20 tablet 0   No current facility-administered medications for this visit.    SURGICAL HISTORY:  Past Surgical History  Procedure Laterality Date  . Vasectomy    . Video bronchoscopy Bilateral 03/20/2014    Procedure: VIDEO BRONCHOSCOPY WITH FLUORO;  Surgeon: Rigoberto Noel, MD;  Location: [facility];  Service: Cardiopulmonary;  Laterality: Bilateral;    REVIEW OF SYSTEMS:  Constitutional: negative Eyes: negative Ears, nose, mouth, throat, and face: negative Respiratory: negative Cardiovascular: negative Gastrointestinal: negative Genitourinary:negative Integument/breast: positive for dryness and  rash Hematologic/lymphatic: negative Musculoskeletal:negative Neurological: negative Behavioral/Psych: negative Endocrine: negative Allergic/Immunologic: negative   PHYSICAL EXAMINATION: General appearance: alert, cooperative and no distress Head: Normocephalic, without obvious abnormality, atraumatic Neck: no adenopathy, no JVD, supple, symmetrical, trachea midline and thyroid not enlarged, symmetric, no tenderness/mass/nodules Lymph nodes: Cervical, supraclavicular, and axillary nodes normal. Resp: clear to auscultation bilaterally Back: symmetric, no curvature. ROM normal. No CVA tenderness. Cardio: regular rate and rhythm, S1, S2 normal, no murmur, click, rub or gallop GI: soft, non-tender; bowel sounds normal; no masses,  no organomegaly Extremities: extremities normal, atraumatic, no cyanosis or edema Neurologic: Alert and oriented X 3, normal strength and tone. Normal symmetric reflexes. Normal coordination and gait  ECOG PERFORMANCE STATUS: 1 - Symptomatic but completely ambulatory  Blood pressure 135/88, pulse 74, temperature 98.3 F (36.8 C), temperature source Oral, resp. rate 19, height 6' 2"  (1.88 m), weight 199 lb 8 oz (90.493 kg), SpO2 100 %.  LABORATORY DATA: Lab Results  Component Value Date   WBC 6.0 10/29/2014   HGB 14.9 10/29/2014   HCT 43.6 10/29/2014   MCV 88.3 10/29/2014   PLT 222 10/29/2014      Chemistry      Component Value Date/Time   NA 142 10/29/2014 1323   K 4.2 10/29/2014 1323   CO2 27 10/29/2014 1323   BUN 10.3 10/29/2014 1323   CREATININE 0.9 10/29/2014 1323      Component Value Date/Time   CALCIUM 9.6 10/29/2014 1323   ALKPHOS 89 10/29/2014 1323   AST 25 10/29/2014 1323   ALT 32 10/29/2014 1323   BILITOT 0.80 10/29/2014 1323       RADIOGRAPHIC STUDIES: Mr [name] DQ Contrast  October 27, 2014   CLINICAL DATA:  Metastatic lung cancer.  S RS restaging 3 months.  EXAM: MRI HEAD WITHOUT AND WITH CONTRAST  TECHNIQUE: Multiplanar, multiecho  pulse sequences of the brain and surrounding structures were obtained without and with intravenous contrast.  CONTRAST:  56m MULTIHANCE GADOBENATE DIMEGLUMINE 529 MG/ML IV SOLN  COMPARISON:  07/18/2014.  03/26/2014.  FINDINGS: The study shows considerable and continued positive response to therapy. There are no foci of restricted diffusion. There is no brain edema, mass effect or shift. Previously seen innumerable punctate foci of contrast enhancement throughout the cerebellum and the cerebral hemispheres are considerably diminished. Most are not visible at all. There are a few punctate foci in the cerebellum and the cerebral hemispheres visible as very low level enhancing foci. None of these are enlarged, brightly enhancing, or associated with edema. No new lesion is seen. Chronic marrow change in the clivus is again noted, also probably contracted consistent with previous treatment. No new bone lesion.  No hydrocephalus or extra-axial collection. No sign of ischemic infarction. There is some mucosal inflammatory change of the maxillary sinuses left more than right.  IMPRESSION: Pronounced favorable response to therapy. Previously seen innumerable enhancing brain foci are now invisible. There are a few punctate foci still barely visible in the cerebellum and cerebral hemispheres, but no new or progressive disease.   Electronically Signed   By: MJan FiremanD.  On: 10/25/2014 10:35   ASSESSMENT AND PLAN: This is a very pleasant 49 years old white male with [name], adenocarcinoma with positive EGFR mutation in exon 19 currently undergoing treatment with oral Gilotrif 40 mg by mouth daily status post 7 months and tolerating his treatment fairly well. So far he has no evidence for disease progression. I recommended for him to continue his current treatment with Gilotrif at the same dose.  For the metastatic bone disease, the patient will continue with monthly Xgeva. He will also  continue on calcium and vitamin D supplement. For the weight loss and depression, he is currently on Remeron 30 mg by mouth each bedtime and feels much better. For the paronychia, He will continue to apply the silver nitrate and Neosporin. He would come back for followup visit in one month with repeat CBC and comprehensive metabolic panel as well as CT scan of the chest, abdomen and pelvis for restaging of his disease. He was advised to call immediately if he has any concerning symptoms in the interval. The patient voices understanding of current disease status and treatment options and is in agreement with the current care plan.  All questions were answered. The patient knows to call the clinic with any problems, questions or concerns. We can certainly see the patient much sooner if necessary.  Disclaimer: This note was dictated with voice recognition software. Similar sounding words can inadvertently be transcribed and may not be corrected upon review.

## 2014-10-29 NOTE — [facility] Oncology Navigator Note (Unsigned)
Spoke with patient today at [facility].  He states he is doing well.  He has gained some weight back and work is going well.  No barriers identified at this time.

## 2014-11-19 ENCOUNTER — Encounter (HOSPITAL_COMMUNITY): Payer: Self-pay

## 2014-11-19 ENCOUNTER — Ambulatory Visit (HOSPITAL_COMMUNITY)
Admission: RE | Admit: 2014-11-19 | Discharge: 2014-11-19 | Disposition: A | Discharge status: Home / Self Care | Payer: BLUE CROSS/BLUE SHIELD | Source: Ambulatory Visit | Attending: Internal Medicine | Admitting: Internal Medicine

## 2014-11-19 ENCOUNTER — Other Ambulatory Visit (HOSPITAL_BASED_OUTPATIENT_CLINIC_OR_DEPARTMENT_OTHER): Payer: BLUE CROSS/BLUE SHIELD

## 2014-11-19 DIAGNOSIS — M899 Disorder of bone, unspecified: Secondary | ICD-10-CM | POA: Diagnosis not present

## 2014-11-19 DIAGNOSIS — C787 Secondary malignant neoplasm of liver and intrahepatic bile duct: Secondary | ICD-10-CM | POA: Diagnosis not present

## 2014-11-19 DIAGNOSIS — C3491 Malignant neoplasm of unspecified part of right bronchus or lung: Secondary | ICD-10-CM | POA: Insufficient documentation

## 2014-11-19 DIAGNOSIS — R918 Other nonspecific abnormal finding of lung field: Secondary | ICD-10-CM | POA: Insufficient documentation

## 2014-11-19 DIAGNOSIS — Z923 Personal history of irradiation: Secondary | ICD-10-CM | POA: Diagnosis not present

## 2014-11-19 DIAGNOSIS — C3411 Malignant neoplasm of upper lobe, right bronchus or lung: Secondary | ICD-10-CM

## 2014-11-19 DIAGNOSIS — C7951 Secondary malignant neoplasm of bone: Secondary | ICD-10-CM | POA: Insufficient documentation

## 2014-11-19 DIAGNOSIS — C7931 Secondary malignant neoplasm of brain: Secondary | ICD-10-CM | POA: Diagnosis not present

## 2014-11-19 LAB — COMPREHENSIVE METABOLIC PANEL (CC13)
ALK PHOS: 91 U/L (ref 40–150)
ALT: 37 U/L (ref 0–55)
AST: 31 U/L (ref 5–34)
Albumin: 3.8 g/dL (ref 3.5–5.0)
Anion Gap: 9 mEq/L (ref 3–11)
BILIRUBIN TOTAL: 0.75 mg/dL (ref 0.20–1.20)
BUN: 11.5 mg/dL (ref 7.0–26.0)
CALCIUM: 9 mg/dL (ref 8.4–10.4)
CO2: 24 mEq/L (ref 22–29)
CREATININE: 1 mg/dL (ref 0.7–1.3)
Chloride: 109 mEq/L (ref 98–109)
EGFR: >90 mL/min/{1.73_m2} (ref >90)
GLUCOSE: 93 mg/dL (ref 70–140)
Potassium: 4.3 mEq/L (ref 3.5–5.1)
SODIUM: 142 meq/L (ref 136–145)
Total Protein: 6.6 g/dL (ref 6.4–8.3)

## 2014-11-19 LAB — CBC WITH DIFFERENTIAL/PLATELET
BASO%: 0.7 % (ref 0.0–2.0)
Basophils Absolute: 0 10*3/uL (ref 0.0–0.1)
EOS%: 2.2 % (ref 0.0–7.0)
Eosinophils Absolute: 0.1 10*3/uL (ref 0.0–0.5)
HCT: 44.2 % (ref 38.4–49.9)
HGB: 14.5 g/dL (ref 13.0–17.1)
LYMPH%: 30 % (ref 14.0–49.0)
MCH: 29.5 pg (ref 27.2–33.4)
MCHC: 32.9 g/dL (ref 32.0–36.0)
MCV: 89.7 fL (ref 79.3–98.0)
MONO#: 0.3 10*3/uL (ref 0.1–0.9)
MONO%: 7.6 % (ref 0.0–14.0)
NEUT#: 2.6 10*3/uL (ref 1.5–6.5)
NEUT%: 59.5 % (ref 39.0–75.0)
PLATELETS: 224 10*3/uL (ref 140–400)
RBC: 4.93 10*6/uL (ref 4.20–5.82)
RDW: 14.3 % (ref 11.0–14.6)
WBC: 4.3 10*3/uL (ref 4.0–10.3)
lymph#: 1.3 10*3/uL (ref 0.9–3.3)

## 2014-11-19 MED ORDER — IOHEXOL 300 MG/ML  SOLN
100.0000 mL | Freq: Once | INTRAMUSCULAR | Status: AC | PRN
  Administered 2014-11-19: 100 mL via INTRAVENOUS

## 2014-11-25 ENCOUNTER — Telehealth: Payer: Self-pay | Admitting: Internal Medicine

## 2014-11-25 ENCOUNTER — Encounter: Payer: Self-pay | Admitting: Internal Medicine

## 2014-11-25 ENCOUNTER — Ambulatory Visit (HOSPITAL_BASED_OUTPATIENT_CLINIC_OR_DEPARTMENT_OTHER): Payer: BLUE CROSS/BLUE SHIELD | Admitting: Internal Medicine

## 2014-11-25 VITALS — BP 124/74 | HR 82 | Temp 97.8°F | Resp 18 | Wt 198.8 lb

## 2014-11-25 DIAGNOSIS — C7931 Secondary malignant neoplasm of brain: Secondary | ICD-10-CM

## 2014-11-25 DIAGNOSIS — C3411 Malignant neoplasm of upper lobe, right bronchus or lung: Secondary | ICD-10-CM

## 2014-11-25 DIAGNOSIS — C787 Secondary malignant neoplasm of liver and intrahepatic bile duct: Secondary | ICD-10-CM

## 2014-11-25 DIAGNOSIS — C3491 Malignant neoplasm of unspecified part of right bronchus or lung: Secondary | ICD-10-CM

## 2014-11-25 DIAGNOSIS — C7951 Secondary malignant neoplasm of bone: Secondary | ICD-10-CM

## 2014-11-25 NOTE — Progress Notes (Signed)
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  Primary site: Lung (Right)  Staging method: AJCC 7th Edition  Clinical: Stage IV (T2a, N0, M1b) signed by [name], MD on 03/26/2014 4:57 PM  Summary: Stage IV (T2a, N0, M1b)   PRIOR THERAPY: Status post whole brain irradiation under the care Dr. Tammi Klippel completed 04/12/2014   CURRENT THERAPY:  1) Gilotrif 40 mg po daily - therapy beginning 04/03/2014. Status post approximately 8 months of therapy  2) Xgeva 120 mcg subcutaneously on monthly basis.   DISEASE STAGE:  Lung cancer  Primary site: Lung (Right)  Staging method: AJCC 7th Edition  Clinical: Stage IV (T2a, N0, M1b) signed by [name], MD on 03/26/2014 4:57 PM  Summary: Stage IV (T2a, N0, M1b)  CHEMOTHERAPY INTENT: palliative  CURRENT # OF CHEMOTHERAPY CYCLES: 9 CURRENT ANTIEMETICS: none  CURRENT SMOKING STATUS: non-smoker/never smoker  ORAL CHEMOTHERAPY AND CONSENT: yes- Gilotrif  CURRENT BISPHOSPHONATES USE: none  PAIN MANAGEMENT: none  NARCOTICS INDUCED CONSTIPATION: none  LIVING WILL AND CODE STATUS:   INTERVAL HISTORY: Oscar Floyd 49 y.o. male returns to the clinic today for followup visit. The patient is tolerating his treatment with Gilotrif fairly well with no significant adverse effects except for persistent mild skin rash on the nose and chest and occasional episodes of diarrhea. He continues to have paronychia mainly on the big toe but it actually better after he saw his dermatologist and given another course of  doxycycline. He is also applying Neosporin to the nails with some improvement. The patient denied having any significant chest pain, shortness of breath, cough or hemoptysis. He has no fever or chills, no nausea or vomiting. He has recent CT scan of the Chest, Abdomen and pelvis performed and the patient is here today for evaluation and discussion of his scan results.  MEDICAL HISTORY: Past Medical History  Diagnosis Date  . Lung cancer     RUL lung with mets to liver, bone and brain  . Hypertension     ALLERGIES:  has No Known Allergies.  MEDICATIONS:  Current Outpatient Prescriptions  Medication Sig Dispense Refill  . afatinib dimaleate (GILOTRIF) 40 MG tablet Take 1 tablet (40 mg total) by mouth daily. Take on an empty stomach 1hr before or 2 hrs after meals. 30 tablet 2  . calcium-vitamin D (OSCAL) 250-125 MG-UNIT per tablet Take 1 tablet by mouth daily.    Marland Kitchen doxycycline (VIBRA-TABS) 100 MG tablet Take 1 tablet (100 mg total) by mouth 2 (two) times daily. 20 tablet 0  . esomeprazole (NEXIUM) 20 MG capsule Take by mouth 2 (two) times daily before a meal.     . loperamide (IMODIUM) 2 MG capsule Take 2 mg by mouth as needed for diarrhea or loose stools.    . mirtazapine (REMERON) 30 MG tablet Take 1 tablet (30 mg total) by mouth at bedtime. 30 tablet 2  . ALPRAZolam (XANAX) 1 MG tablet     . naproxen sodium (ANAPROX) 220 MG tablet Take 220 mg by mouth as needed.     No current facility-administered  medications for this visit.    SURGICAL HISTORY:  Past Surgical History  Procedure Laterality Date  . Vasectomy    . Video bronchoscopy Bilateral 03/20/2014    Procedure: VIDEO BRONCHOSCOPY WITH FLUORO;  Surgeon: Rigoberto Noel, MD;  Location: [facility];  Service: Cardiopulmonary;  Laterality: Bilateral;    REVIEW OF SYSTEMS:  Constitutional: negative Eyes: negative Ears, nose, mouth, throat, and face: negative Respiratory: negative Cardiovascular: negative Gastrointestinal:  negative Genitourinary:negative Integument/breast: positive for dryness and rash Hematologic/lymphatic: negative Musculoskeletal:negative Neurological: negative Behavioral/Psych: negative Endocrine: negative Allergic/Immunologic: negative   PHYSICAL EXAMINATION: General appearance: alert, cooperative and no distress Head: Normocephalic, without obvious abnormality, atraumatic Neck: no adenopathy, no JVD, supple, symmetrical, trachea midline and thyroid not enlarged, symmetric, no tenderness/mass/nodules Lymph nodes: Cervical, supraclavicular, and axillary nodes normal. Resp: clear to auscultation bilaterally Back: symmetric, no curvature. ROM normal. No CVA tenderness. Cardio: regular rate and rhythm, S1, S2 normal, no murmur, click, rub or gallop GI: soft, non-tender; bowel sounds normal; no masses,  no organomegaly Extremities: extremities normal, atraumatic, no cyanosis or edema Neurologic: Alert and oriented X 3, normal strength and tone. Normal symmetric reflexes. Normal coordination and gait  ECOG PERFORMANCE STATUS: 1 - Symptomatic but completely ambulatory  Blood pressure 124/74, pulse 82, temperature 97.8 F (36.6 C), resp. rate 18, weight 198 lb 12.8 oz (90.175 kg).  LABORATORY DATA: Lab Results  Component Value Date   WBC 4.3 11/19/2014   HGB 14.5 11/19/2014   HCT 44.2 11/19/2014   MCV 89.7 11/19/2014   PLT 224 11/19/2014      Chemistry      Component Value Date/Time   NA 142 11/19/2014 0926   K 4.3 11/19/2014 0926   CO2 24 11/19/2014 0926   BUN 11.5 11/19/2014 0926   CREATININE 1.0 11/19/2014 0926      Component Value Date/Time   CALCIUM 9.0 11/19/2014 0926   ALKPHOS 91 11/19/2014 0926   AST 31 11/19/2014 0926   ALT 37 11/19/2014 0926   BILITOT 0.75 11/19/2014 0926       RADIOGRAPHIC STUDIES: Ct Chest W Contrast  11/19/2014   CLINICAL DATA:  49 year old male with lung cancer with liver, brain and bone metastases diagnosed in 2015. Radiation therapy  complete. Oral chemotherapy.  EXAM: CT CHEST, ABDOMEN, AND PELVIS WITH CONTRAST  TECHNIQUE: Multidetector CT imaging of the chest, abdomen and pelvis was performed following the standard protocol during bolus administration of intravenous contrast.  CONTRAST:  194m OMNIPAQUE IOHEXOL 300 MG/ML  SOLN  COMPARISON:  CT of the chest, abdomen and pelvis 08/27/2014.  FINDINGS: CT CHEST FINDINGS  Mediastinum/Lymph Nodes: No pathologically enlarged mediastinal or hilar lymph nodes. Heart size is normal. There is no significant pericardial fluid, thickening or pericardial calcification. Small amount of soft tissue in the anterior mediastinum has an appearance most compatible with residual thymic tissue, stable compared to prior examinations. Esophagus is unremarkable in appearance. No axillary lymphadenopathy.  Lungs/Pleura: Treated right upper lobe lesion is unchanged in size measuring 22 x 17 mm on today's examination. There continues to be innumerable small pulmonary nodules scattered elsewhere throughout the lungs bilaterally (right greater than left), which appear essentially unchanged compared to the prior examination. No acute consolidative airspace disease. No pleural effusions.  Musculoskeletal/Soft Tissues: Innumerable predominantly sclerotic lesions are again noted throughout the visualized axial and appendicular skeleton, similar to prior examinations, favored to reflect treated metastasis. No definite new osseous lesions are confidently identified on today's examination. No pathologic fractures noted in the visualized skeleton.  CT ABDOMEN AND PELVIS FINDINGS  Hepatobiliary: No cystic or solid hepatic lesions. No intra or extrahepatic biliary ductal dilatation. Gallbladder is normal in appearance.  Pancreas: Unremarkable.  Spleen: Unremarkable.  Adrenals/Urinary Tract: Bilateral adrenal glands and bilateral kidneys are normal in appearance. No hydroureteronephrosis. Urinary bladder is normal in appearance.  Normal adrenal glands bilaterally.  Stomach/Bowel: Normal appearance of the stomach. No pathologic dilatation of small bowel or colon. Normal appendix.  Vascular/Lymphatic: No significant atherosclerotic disease, aneurysm or dissection in the abdominal or pelvic vasculature. No lymphadenopathy noted in the abdomen or pelvis.  Reproductive: Prostate gland and seminal vesicles are unremarkable in appearance.  Other: No significant volume of ascites.  No pneumoperitoneum.  Musculoskeletal: Innumerable predominantly sclerotic lesions are again noted throughout the axial and appendicular skeleton, similar to prior examinations, most compatible with widespread treated metastatic disease. No definite new osseous lesions are noted. No definite pathologic fractures noted in the visualized portions of the skeleton.  IMPRESSION: 1. No significant change on today's examination as compared to prior study 08/27/2014. Specifically, the right upper lobe pulmonary nodule, other widespread tiny pulmonary nodules in lungs bilaterally, and extensive treated osseous metastases are essentially unchanged compared to the prior examination. No new sites of metastatic disease are noted. 2. Additional incidental findings, as above.   Electronically Signed   By: Vinnie Langton M.D.   On: 11/19/2014 13:18   Ct Abdomen Pelvis W Contrast  11/19/2014   CLINICAL DATA:  49 year old male with lung cancer with liver, brain and bone metastases diagnosed in 2015. Radiation therapy complete. Oral chemotherapy.  EXAM: CT CHEST, ABDOMEN, AND PELVIS WITH CONTRAST  TECHNIQUE: Multidetector CT imaging of the chest, abdomen and pelvis was performed following the standard protocol during bolus administration of intravenous contrast.  CONTRAST:  143m OMNIPAQUE IOHEXOL 300 MG/ML  SOLN  COMPARISON:  CT of the chest, abdomen and pelvis 08/27/2014.  FINDINGS: CT CHEST FINDINGS  Mediastinum/Lymph Nodes: No pathologically enlarged mediastinal or hilar lymph  nodes. Heart size is normal. There is no significant pericardial fluid, thickening or pericardial calcification. Small amount of soft tissue in the anterior mediastinum has an appearance most compatible with residual thymic tissue, stable compared to prior examinations. Esophagus is unremarkable in appearance. No axillary lymphadenopathy.  Lungs/Pleura: Treated right upper lobe lesion is unchanged in size measuring 22 x 17 mm on today's examination. There continues to be innumerable small pulmonary nodules scattered elsewhere throughout the lungs bilaterally (right greater than left), which appear essentially unchanged compared to the prior examination. No acute consolidative airspace disease. No pleural effusions.  Musculoskeletal/Soft Tissues: Innumerable predominantly sclerotic lesions are again noted throughout the visualized axial and appendicular skeleton, similar to prior examinations, favored to reflect treated metastasis. No definite new osseous lesions are confidently identified on today's examination. No pathologic fractures noted in the visualized skeleton.  CT ABDOMEN AND PELVIS FINDINGS  Hepatobiliary: No cystic or solid hepatic lesions. No intra or extrahepatic biliary ductal dilatation. Gallbladder is normal in appearance.  Pancreas: Unremarkable.  Spleen: Unremarkable.  Adrenals/Urinary Tract: Bilateral adrenal glands and bilateral kidneys are normal in appearance. No hydroureteronephrosis. Urinary bladder is normal in appearance. Normal adrenal glands bilaterally.  Stomach/Bowel: Normal appearance of the stomach. No pathologic dilatation of small bowel or colon. Normal appendix.  Vascular/Lymphatic: No significant atherosclerotic disease, aneurysm or dissection in the abdominal or pelvic vasculature. No lymphadenopathy noted in the abdomen or pelvis.  Reproductive: Prostate gland and seminal vesicles are unremarkable in appearance.  Other: No significant volume of ascites.  No  pneumoperitoneum.   Musculoskeletal: Innumerable predominantly sclerotic lesions are again noted throughout the axial and appendicular skeleton, similar to prior examinations, most compatible with widespread treated metastatic disease. No definite new osseous lesions are noted. No definite pathologic fractures noted in the visualized portions of the skeleton.  IMPRESSION: 1. No significant change on today's examination as compared to prior study 08/27/2014. Specifically, the right upper lobe pulmonary nodule, other widespread tiny pulmonary nodules in lungs bilaterally, and extensive treated osseous metastases are essentially unchanged compared to the prior examination. No new sites of metastatic disease are noted. 2. Additional incidental findings, as above.   Electronically Signed   By: Vinnie Langton M.D.   On: 11/19/2014 13:18   ASSESSMENT AND PLAN: This is a very pleasant 49 years old white male with [name], adenocarcinoma with positive EGFR mutation in exon 19 currently undergoing treatment with oral Gilotrif 40 mg by mouth daily status post 8 months and tolerating his treatment fairly well. The recent CT scan of the chest, abdomen and pelvis showed no evidence for disease progression. I discussed the scan results with the patient today. I recommended for him to continue his current treatment with Gilotrif at the same dose.  For the metastatic bone disease, the patient will continue with monthly Xgeva. He will also continue on calcium and vitamin D supplement. For the weight loss and depression, he is currently on Remeron 30 mg by mouth each bedtime and feels much better. For the paronychia, He will continue to apply the silver nitrate and Neosporin as well as doxycycline on as-needed basis. He would come back for followup visit in one month with repeat CBC and comprehensive metabolic panel. He was advised to call immediately if he has any concerning symptoms in the interval. The patient  voices understanding of current disease status and treatment options and is in agreement with the current care plan.  All questions were answered. The patient knows to call the clinic with any problems, questions or concerns. We can certainly see the patient much sooner if necessary.  Disclaimer: This note was dictated with voice recognition software. Similar sounding words can inadvertently be transcribed and may not be corrected upon review.

## 2014-11-25 NOTE — Telephone Encounter (Signed)
Gave avs & calendar for April. °

## 2014-11-27 ENCOUNTER — Other Ambulatory Visit: Payer: Self-pay | Admitting: Medical Oncology

## 2014-11-27 ENCOUNTER — Other Ambulatory Visit: Payer: Self-pay | Admitting: *Deleted

## 2014-11-27 DIAGNOSIS — C7931 Secondary malignant neoplasm of brain: Secondary | ICD-10-CM

## 2014-11-27 DIAGNOSIS — C3490 Malignant neoplasm of unspecified part of unspecified bronchus or lung: Secondary | ICD-10-CM

## 2014-11-27 MED ORDER — AFATINIB DIMALEATE 40 MG PO TABS: 40.0000 mg | ORAL_TABLET | Freq: Every day | ORAL | Status: DC

## 2014-12-03 ENCOUNTER — Other Ambulatory Visit: Payer: Self-pay | Admitting: *Deleted

## 2014-12-03 MED ORDER — MIRTAZAPINE 30 MG PO TABS: 30.0000 mg | ORAL_TABLET | Freq: Every day | ORAL | Status: DC

## 2014-12-26 ENCOUNTER — Encounter: Payer: Self-pay | Admitting: Internal Medicine

## 2014-12-26 ENCOUNTER — Other Ambulatory Visit (HOSPITAL_BASED_OUTPATIENT_CLINIC_OR_DEPARTMENT_OTHER): Payer: BLUE CROSS/BLUE SHIELD

## 2014-12-26 ENCOUNTER — Telehealth: Payer: Self-pay | Admitting: Internal Medicine

## 2014-12-26 ENCOUNTER — Ambulatory Visit (HOSPITAL_BASED_OUTPATIENT_CLINIC_OR_DEPARTMENT_OTHER): Payer: BLUE CROSS/BLUE SHIELD | Admitting: Internal Medicine

## 2014-12-26 VITALS — BP 126/80 | HR 79 | Temp 98.4°F | Resp 18 | Ht 74.0 in | Wt 201.0 lb

## 2014-12-26 DIAGNOSIS — C7951 Secondary malignant neoplasm of bone: Secondary | ICD-10-CM

## 2014-12-26 DIAGNOSIS — C787 Secondary malignant neoplasm of liver and intrahepatic bile duct: Secondary | ICD-10-CM

## 2014-12-26 DIAGNOSIS — C3411 Malignant neoplasm of upper lobe, right bronchus or lung: Secondary | ICD-10-CM

## 2014-12-26 DIAGNOSIS — C7931 Secondary malignant neoplasm of brain: Secondary | ICD-10-CM

## 2014-12-26 DIAGNOSIS — C3491 Malignant neoplasm of unspecified part of right bronchus or lung: Secondary | ICD-10-CM

## 2014-12-26 DIAGNOSIS — F329 Major depressive disorder, single episode, unspecified: Secondary | ICD-10-CM

## 2014-12-26 DIAGNOSIS — R634 Abnormal weight loss: Secondary | ICD-10-CM

## 2014-12-26 LAB — CBC WITH DIFFERENTIAL/PLATELET
BASO%: 1 % (ref 0.0–2.0)
BASOS ABS: 0 10*3/uL (ref 0.0–0.1)
EOS%: 2.6 % (ref 0.0–7.0)
Eosinophils Absolute: 0.1 10*3/uL (ref 0.0–0.5)
HCT: 43.2 % (ref 38.4–49.9)
HGB: 14.4 g/dL (ref 13.0–17.1)
LYMPH%: 26.6 % (ref 14.0–49.0)
MCH: 29.8 pg (ref 27.2–33.4)
MCHC: 33.3 g/dL (ref 32.0–36.0)
MCV: 89.5 fL (ref 79.3–98.0)
MONO#: 0.3 10*3/uL (ref 0.1–0.9)
MONO%: 6.8 % (ref 0.0–14.0)
NEUT%: 63 % (ref 39.0–75.0)
NEUTROS ABS: 3.1 10*3/uL (ref 1.5–6.5)
PLATELETS: 187 10*3/uL (ref 140–400)
RBC: 4.82 10*6/uL (ref 4.20–5.82)
RDW: 13.7 % (ref 11.0–14.6)
WBC: 4.9 10*3/uL (ref 4.0–10.3)
lymph#: 1.3 10*3/uL (ref 0.9–3.3)

## 2014-12-26 LAB — COMPREHENSIVE METABOLIC PANEL (CC13)
ALBUMIN: 3.7 g/dL (ref 3.5–5.0)
ALT: 28 U/L (ref 0–55)
AST: 24 U/L (ref 5–34)
Alkaline Phosphatase: 73 U/L (ref 40–150)
Anion Gap: 8 mEq/L (ref 3–11)
BUN: 7.2 mg/dL (ref 7.0–26.0)
CALCIUM: 8.7 mg/dL (ref 8.4–10.4)
CHLORIDE: 109 meq/L (ref 98–109)
CO2: 28 meq/L (ref 22–29)
Creatinine: 1 mg/dL (ref 0.7–1.3)
EGFR: >90 mL/min/{1.73_m2} (ref >90)
Glucose: 116 mg/dl (ref 70–140)
POTASSIUM: 3.9 meq/L (ref 3.5–5.1)
SODIUM: 145 meq/L (ref 136–145)
TOTAL PROTEIN: 6.4 g/dL (ref 6.4–8.3)
Total Bilirubin: 0.76 mg/dL (ref 0.20–1.20)

## 2014-12-26 NOTE — Telephone Encounter (Signed)
Appointments made and avs printed for patient °

## 2014-12-26 NOTE — Progress Notes (Signed)
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  Primary site: Lung (Right)  Staging method: AJCC 7th Edition  Clinical: Stage IV (T2a, N0, M1b) signed by [name], MD on 03/26/2014 4:57 PM  Summary: Stage IV (T2a, N0, M1b)   PRIOR THERAPY: Status post whole brain irradiation under the care Dr. Tammi Klippel completed 04/12/2014   CURRENT THERAPY:  1) Gilotrif 40 mg po daily - therapy beginning 04/03/2014. Status post approximately 9 months of therapy  2) Xgeva 120 mcg subcutaneously on monthly basis.   DISEASE STAGE:  Lung cancer  Primary site: Lung (Right)  Staging method: AJCC 7th Edition  Clinical: Stage IV (T2a, N0, M1b) signed by [name], MD on 03/26/2014 4:57 PM  Summary: Stage IV (T2a, N0, M1b)  CHEMOTHERAPY INTENT: palliative  CURRENT # OF CHEMOTHERAPY CYCLES: 10  CURRENT ANTIEMETICS: none  CURRENT SMOKING STATUS: non-smoker/never smoker  ORAL CHEMOTHERAPY AND CONSENT: yes- Gilotrif  CURRENT BISPHOSPHONATES USE: none  PAIN MANAGEMENT: none  NARCOTICS INDUCED CONSTIPATION: none  LIVING WILL AND CODE STATUS:   INTERVAL HISTORY: Olander Friedl 49 y.o. male returns to the clinic today for followup visit. The patient is tolerating his treatment with Gilotrif fairly well with no significant adverse effects except for persistent mild skin rash on the nose and chest, paronychia and occasional episodes of diarrhea.  The patient denied having any significant chest pain, shortness of breath, cough or hemoptysis. He has no fever or chills,  no nausea or vomiting. He is here today for evaluation and repeat blood work.  MEDICAL HISTORY: Past Medical History  Diagnosis Date  . Lung cancer     RUL lung with mets to liver, bone and brain  . Hypertension     ALLERGIES:  has No Known Allergies.  MEDICATIONS:  Current Outpatient Prescriptions  Medication Sig Dispense Refill  . afatinib dimaleate (GILOTRIF) 40 MG tablet Take 1 tablet (40 mg total) by mouth daily. Take on an empty stomach 1hr before or 2 hrs after meals. 30 tablet 1  . calcium-vitamin D (OSCAL) 250-125 MG-UNIT per tablet Take 1 tablet by mouth daily.    Marland Kitchen doxycycline (VIBRA-TABS) 100 MG tablet Take 1 tablet (100 mg total) by mouth 2 (two) times daily. 20 tablet 0  . esomeprazole (NEXIUM) 20 MG capsule Take by mouth 2 (two) times daily before a meal.     . loperamide (IMODIUM) 2 MG capsule Take 2 mg by mouth as needed for diarrhea or loose stools.    . mirtazapine (REMERON) 30 MG tablet Take 1 tablet (30 mg total) by mouth at bedtime. 30 tablet 2  . naproxen sodium (ANAPROX) 220 MG tablet Take 220 mg by mouth as needed.    . ALPRAZolam (XANAX) 1 MG tablet      No current facility-administered medications for this visit.    SURGICAL HISTORY:  Past Surgical History  Procedure Laterality Date  . Vasectomy    . Video bronchoscopy Bilateral 03/20/2014    Procedure: VIDEO BRONCHOSCOPY WITH FLUORO;  Surgeon: Rigoberto Noel, MD;  Location: [facility];  Service: Cardiopulmonary;  Laterality: Bilateral;    REVIEW OF SYSTEMS:  Constitutional: negative Eyes: negative Ears, nose, mouth, throat, and face: negative Respiratory: negative Cardiovascular: negative Gastrointestinal: negative Genitourinary:negative Integument/breast: positive for dryness and rash Hematologic/lymphatic: negative Musculoskeletal:negative Neurological: negative Behavioral/Psych: negative Endocrine: negative Allergic/Immunologic: negative   PHYSICAL EXAMINATION: General appearance: alert,  cooperative and no distress Head: Normocephalic, without obvious abnormality, atraumatic Neck: no adenopathy, no JVD, supple, symmetrical, trachea midline and thyroid not enlarged, symmetric, no tenderness/mass/nodules Lymph nodes: Cervical, supraclavicular, and axillary nodes normal. Resp: clear to auscultation bilaterally Back: symmetric, no curvature. ROM normal. No CVA tenderness. Cardio: regular rate and rhythm, S1, S2 normal, no murmur, click, rub or gallop GI: soft, non-tender; bowel sounds normal; no masses,  no organomegaly Extremities: extremities normal, atraumatic, no cyanosis or edema Neurologic: Alert and oriented X 3, normal strength and tone. Normal symmetric reflexes. Normal coordination and gait  ECOG PERFORMANCE STATUS: 1 - Symptomatic but completely ambulatory  Blood pressure 126/80, pulse 79, temperature 98.4 F (36.9 C), temperature source Oral, resp. rate 18, height 6' 2"  (1.88 m), weight 201 lb (91.173 kg), SpO2 99 %.  LABORATORY DATA: Lab Results  Component Value Date   WBC 4.9 12/26/2014   HGB 14.4 12/26/2014   HCT 43.2 12/26/2014   MCV 89.5 12/26/2014   PLT 187 12/26/2014      Chemistry      Component Value Date/Time   NA 142 11/19/2014 0926   K 4.3 11/19/2014 0926   CO2 24 11/19/2014 0926   BUN 11.5 11/19/2014 0926   CREATININE 1.0 11/19/2014 0926      Component Value Date/Time   CALCIUM 9.0 11/19/2014 0926   ALKPHOS 91 11/19/2014 0926   AST 31 11/19/2014 0926   ALT 37 11/19/2014 0926   BILITOT 0.75 11/19/2014 0926       RADIOGRAPHIC STUDIES: No results found. ASSESSMENT AND PLAN: This is a very pleasant 49 years old white male with [name], adenocarcinoma with positive EGFR mutation in exon 19 currently undergoing treatment with oral Gilotrif 40 mg by mouth daily status post 9 months and tolerating his treatment fairly well. His CBC today showed no significant abnormalities. I recommended for him to continue his  current treatment with Gilotrif at the same dose.  For the metastatic bone disease, the patient will continue with monthly Xgeva. He will also continue on calcium and vitamin D supplement. For the weight loss and depression, he is currently on Remeron 30 mg by mouth each bedtime and feels much better. He would come back for followup visit in one month with repeat CBC and comprehensive metabolic panel. He was advised to call immediately if he has any concerning symptoms in the interval. The patient voices understanding of current disease status and treatment options and is in agreement with the current care plan.  All questions were answered. The patient knows to call the clinic with any problems, questions or concerns. We can certainly see the patient much sooner if necessary.  Disclaimer: This note was dictated with voice recognition software. Similar sounding words can inadvertently be transcribed and may not be corrected upon review.

## 2015-01-13 ENCOUNTER — Ambulatory Visit
Admission: RE | Admit: 2015-01-13 | Discharge: 2015-01-13 | Disposition: A | Discharge status: Home / Self Care | Payer: BLUE CROSS/BLUE SHIELD | Source: Ambulatory Visit | Attending: Radiation Oncology | Admitting: Radiation Oncology

## 2015-01-13 DIAGNOSIS — C7931 Secondary malignant neoplasm of brain: Secondary | ICD-10-CM

## 2015-01-13 MED ORDER — GADOBENATE DIMEGLUMINE 529 MG/ML IV SOLN
20.0000 mL | Freq: Once | INTRAVENOUS | Status: AC | PRN
  Administered 2015-01-13: 20 mL via INTRAVENOUS

## 2015-01-16 ENCOUNTER — Other Ambulatory Visit: Payer: BLUE CROSS/BLUE SHIELD

## 2015-01-16 ENCOUNTER — Telehealth: Payer: Self-pay | Admitting: Internal Medicine

## 2015-01-16 NOTE — Telephone Encounter (Signed)
returned call and s.w. pt and he needed radiation to r/s appt....transferred pt to radiation

## 2015-01-17 ENCOUNTER — Other Ambulatory Visit: Payer: BLUE CROSS/BLUE SHIELD

## 2015-01-20 ENCOUNTER — Inpatient Hospital Stay: Admission: RE | Admit: 2015-01-20 | Payer: Self-pay | Source: Ambulatory Visit | Admitting: Radiation Oncology

## 2015-02-04 ENCOUNTER — Encounter: Payer: Self-pay | Admitting: Radiation Therapy

## 2015-02-05 ENCOUNTER — Telehealth: Payer: Self-pay | Admitting: Internal Medicine

## 2015-02-05 ENCOUNTER — Other Ambulatory Visit (HOSPITAL_BASED_OUTPATIENT_CLINIC_OR_DEPARTMENT_OTHER): Payer: BLUE CROSS/BLUE SHIELD

## 2015-02-05 ENCOUNTER — Ambulatory Visit (HOSPITAL_BASED_OUTPATIENT_CLINIC_OR_DEPARTMENT_OTHER): Payer: BLUE CROSS/BLUE SHIELD | Admitting: Internal Medicine

## 2015-02-05 ENCOUNTER — Ambulatory Visit
Admission: RE | Admit: 2015-02-05 | Discharge: 2015-02-05 | Disposition: A | Discharge status: Home / Self Care | Payer: BLUE CROSS/BLUE SHIELD | Source: Ambulatory Visit | Attending: Radiation Oncology | Admitting: Radiation Oncology

## 2015-02-05 ENCOUNTER — Encounter: Payer: Self-pay | Admitting: Internal Medicine

## 2015-02-05 ENCOUNTER — Encounter: Payer: Self-pay | Admitting: Radiation Oncology

## 2015-02-05 VITALS — BP 145/88 | HR 80 | Temp 98.1°F | Resp 18 | Ht 74.0 in | Wt 202.0 lb

## 2015-02-05 VITALS — BP 145/88 | HR 80 | Temp 98.1°F | Resp 18 | Wt 202.1 lb

## 2015-02-05 DIAGNOSIS — C3491 Malignant neoplasm of unspecified part of right bronchus or lung: Secondary | ICD-10-CM

## 2015-02-05 DIAGNOSIS — C787 Secondary malignant neoplasm of liver and intrahepatic bile duct: Secondary | ICD-10-CM | POA: Diagnosis not present

## 2015-02-05 DIAGNOSIS — C7931 Secondary malignant neoplasm of brain: Secondary | ICD-10-CM | POA: Diagnosis not present

## 2015-02-05 DIAGNOSIS — C7949 Secondary malignant neoplasm of other parts of nervous system: Principal | ICD-10-CM

## 2015-02-05 DIAGNOSIS — F329 Major depressive disorder, single episode, unspecified: Secondary | ICD-10-CM

## 2015-02-05 DIAGNOSIS — R634 Abnormal weight loss: Secondary | ICD-10-CM

## 2015-02-05 DIAGNOSIS — C3411 Malignant neoplasm of upper lobe, right bronchus or lung: Secondary | ICD-10-CM | POA: Diagnosis not present

## 2015-02-05 DIAGNOSIS — R21 Rash and other nonspecific skin eruption: Secondary | ICD-10-CM

## 2015-02-05 DIAGNOSIS — C7951 Secondary malignant neoplasm of bone: Secondary | ICD-10-CM

## 2015-02-05 LAB — COMPREHENSIVE METABOLIC PANEL (CC13)
ALT: 32 U/L (ref 0–55)
ANION GAP: 8 meq/L (ref 3–11)
AST: 27 U/L (ref 5–34)
Albumin: 3.6 g/dL (ref 3.5–5.0)
Alkaline Phosphatase: 85 U/L (ref 40–150)
BUN: 7.9 mg/dL (ref 7.0–26.0)
CALCIUM: 8.6 mg/dL (ref 8.4–10.4)
CO2: 23 mEq/L (ref 22–29)
Chloride: 109 mEq/L (ref 98–109)
Creatinine: 1 mg/dL (ref 0.7–1.3)
EGFR: 86 mL/min/{1.73_m2} — ABNORMAL LOW (ref >90)
Glucose: 113 mg/dl (ref 70–140)
Potassium: 4.1 mEq/L (ref 3.5–5.1)
SODIUM: 140 meq/L (ref 136–145)
TOTAL PROTEIN: 6.5 g/dL (ref 6.4–8.3)
Total Bilirubin: 0.64 mg/dL (ref 0.20–1.20)

## 2015-02-05 LAB — CBC WITH DIFFERENTIAL/PLATELET
BASO%: 0.9 % (ref 0.0–2.0)
Basophils Absolute: 0.1 10*3/uL (ref 0.0–0.1)
EOS%: 2.8 % (ref 0.0–7.0)
Eosinophils Absolute: 0.2 10*3/uL (ref 0.0–0.5)
HCT: 42.5 % (ref 38.4–49.9)
HGB: 14.3 g/dL (ref 13.0–17.1)
LYMPH%: 18.9 % (ref 14.0–49.0)
MCH: 30.1 pg (ref 27.2–33.4)
MCHC: 33.7 g/dL (ref 32.0–36.0)
MCV: 89.2 fL (ref 79.3–98.0)
MONO#: 0.3 10*3/uL (ref 0.1–0.9)
MONO%: 5.2 % (ref 0.0–14.0)
NEUT#: 4.6 10*3/uL (ref 1.5–6.5)
NEUT%: 72.2 % (ref 39.0–75.0)
Platelets: 200 10*3/uL (ref 140–400)
RBC: 4.76 10*6/uL (ref 4.20–5.82)
RDW: 13.1 % (ref 11.0–14.6)
WBC: 6.4 10*3/uL (ref 4.0–10.3)
lymph#: 1.2 10*3/uL (ref 0.9–3.3)

## 2015-02-05 NOTE — Progress Notes (Signed)
Continues to take Gilotrif daily. Reports associated side effects are diarrhea and sores on finger tips and toes. Reports rare headaches. Denies nausea, vomiting, dizziness, diplopia or ringing in the ears. Denies taking steroids at this time. Reports Remeron has been very helpful. Denies confusion or generalized weakness. Continues to work full time. Weight and vitals stable. Denies pain. Reports middle back pain when standing long periods of time.

## 2015-02-05 NOTE — Progress Notes (Signed)
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  Primary site: Lung (Right)  Staging method: AJCC 7th Edition  Clinical: Stage IV (T2a, N0, M1b) signed by [name], MD on 03/26/2014 4:57 PM  Summary: Stage IV (T2a, N0, M1b)   PRIOR THERAPY: Status post whole brain irradiation under the care Dr. Tammi Klippel completed 04/12/2014   CURRENT THERAPY:  1) Gilotrif 40 mg po daily - therapy beginning 04/03/2014. Status post approximately 10 months of therapy  2) Xgeva 120 mcg subcutaneously on monthly basis.   DISEASE STAGE:  Lung cancer  Primary site: Lung (Right)  Staging method: AJCC 7th Edition  Clinical: Stage IV (T2a, N0, M1b) signed by [name], MD on 03/26/2014 4:57 PM  Summary: Stage IV (T2a, N0, M1b)  CHEMOTHERAPY INTENT: palliative  CURRENT # OF CHEMOTHERAPY CYCLES: 11  CURRENT ANTIEMETICS: none  CURRENT SMOKING STATUS: non-smoker/never smoker  ORAL CHEMOTHERAPY AND CONSENT: yes- Gilotrif  CURRENT BISPHOSPHONATES USE: none  PAIN MANAGEMENT: none  NARCOTICS INDUCED CONSTIPATION: none  LIVING WILL AND CODE STATUS:   INTERVAL HISTORY: Oscar Floyd 49 y.o. male returns to the clinic today for followup visit. The patient is tolerating his treatment with Gilotrif fairly well with no significant adverse effects except for persistent mild skin rash on the nose and chest, paronychia and occasional episodes of diarrhea.  The patient denied having any significant chest pain, shortness of breath, cough or hemoptysis. He has no fever or chills,  no nausea or vomiting. He is here today for evaluation and repeat CBC and comprehensive metabolic panel. He had a recent MRI of the brain that showed further improvement in his disease in the brain.  MEDICAL HISTORY: Past Medical History  Diagnosis Date  . Lung cancer     RUL lung with mets to liver, bone and brain  . Hypertension     ALLERGIES:  has No Known Allergies.  MEDICATIONS:  Current Outpatient Prescriptions  Medication Sig Dispense Refill  . afatinib dimaleate (GILOTRIF) 40 MG tablet Take 1 tablet (40 mg total) by mouth daily. Take on an empty stomach 1hr before or 2 hrs after meals. 30 tablet 1  . ALPRAZolam (XANAX) 1 MG tablet     . calcium-vitamin D (OSCAL) 250-125 MG-UNIT per tablet Take 1 tablet by mouth daily.    Marland Kitchen doxycycline (VIBRA-TABS) 100 MG tablet Take 1 tablet (100 mg total) by mouth 2 (two) times daily. 20 tablet 0  . esomeprazole (NEXIUM) 20 MG capsule Take by mouth 2 (two) times daily before a meal.     . loperamide (IMODIUM) 2 MG capsule Take 2 mg by mouth as needed for diarrhea or loose stools.    . mirtazapine (REMERON) 30 MG tablet Take 1 tablet (30 mg total) by mouth at bedtime. 30 tablet 2  . naproxen sodium (ANAPROX) 220 MG tablet Take 220 mg by mouth as needed.     No current facility-administered medications for this visit.    SURGICAL HISTORY:  Past Surgical History  Procedure Laterality Date  . Vasectomy    . Video bronchoscopy Bilateral 03/20/2014  Procedure: VIDEO BRONCHOSCOPY WITH FLUORO;  Surgeon: Rigoberto Noel, MD;  Location: [facility];  Service: Cardiopulmonary;  Laterality: Bilateral;    REVIEW OF SYSTEMS:  Constitutional: negative Eyes: negative Ears, nose, mouth, throat, and face: negative Respiratory: negative Cardiovascular: negative Gastrointestinal: negative Genitourinary:negative Integument/breast: positive for dryness and rash Hematologic/lymphatic: negative Musculoskeletal:negative Neurological:  negative Behavioral/Psych: negative Endocrine: negative Allergic/Immunologic: negative   PHYSICAL EXAMINATION: General appearance: alert, cooperative and no distress Head: Normocephalic, without obvious abnormality, atraumatic Neck: no adenopathy, no JVD, supple, symmetrical, trachea midline and thyroid not enlarged, symmetric, no tenderness/mass/nodules Lymph nodes: Cervical, supraclavicular, and axillary nodes normal. Resp: clear to auscultation bilaterally Back: symmetric, no curvature. ROM normal. No CVA tenderness. Cardio: regular rate and rhythm, S1, S2 normal, no murmur, click, rub or gallop GI: soft, non-tender; bowel sounds normal; no masses,  no organomegaly Extremities: extremities normal, atraumatic, no cyanosis or edema Neurologic: Alert and oriented X 3, normal strength and tone. Normal symmetric reflexes. Normal coordination and gait  ECOG PERFORMANCE STATUS: 1 - Symptomatic but completely ambulatory  Blood pressure 145/88, pulse 80, temperature 98.1 F (36.7 C), temperature source Oral, resp. rate 18, height 6' 2"  (1.88 m), weight 202 lb (91.627 kg), SpO2 100 %.  LABORATORY DATA: Lab Results  Component Value Date   WBC 6.4 02/05/2015   HGB 14.3 02/05/2015   HCT 42.5 02/05/2015   MCV 89.2 02/05/2015   PLT 200 02/05/2015      Chemistry      Component Value Date/Time   NA 145 12/26/2014 0840   K 3.9 12/26/2014 0840   CO2 28 12/26/2014 0840   BUN 7.2 12/26/2014 0840   CREATININE 1.0 12/26/2014 0840      Component Value Date/Time   CALCIUM 8.7 12/26/2014 0840   ALKPHOS 73 12/26/2014 0840   AST 24 12/26/2014 0840   ALT 28 12/26/2014 0840   BILITOT 0.76 12/26/2014 0840       RADIOGRAPHIC STUDIES: Mr [name] YC Contrast  11-Feb-2015   ADDENDUM REPORT: 2015-02-11 11:15  ADDENDUM: Metastatic deposit in the clivus is stable.   Electronically Signed   By: Franchot Gallo M.D.   On: 02-11-15 11:15   11-Feb-2015   CLINICAL DATA:  Metastatic lung cancer.  Six month  restaging  EXAM: MRI HEAD WITHOUT AND WITH CONTRAST  TECHNIQUE: Multiplanar, multiecho pulse sequences of the brain and surrounding structures were obtained without and with intravenous contrast.  CONTRAST:  37m MULTIHANCE GADOBENATE DIMEGLUMINE 529 MG/ML IV SOLN  COMPARISON:  MRI 10/25/2014, 07/18/2014  FINDINGS: Continued improvement in metastatic disease. Previously noted punctate areas of enhancement in the brain are now difficult to identify. Small cerebellar lesions seen previously not identified. No new or enlarging metastatic deposits identified. Leptomeningeal enhancement is within normal limits. No edema in the brain.  Ventricle size is normal. Negative for acute infarct. Negative for intracranial hemorrhage.  Mucosal edema paranasal sinuses most prominent left maxillary sinus.  IMPRESSION: Continued improvement in cerebral metastatic disease. Small punctate enhancing lesions are now very difficult to identify. No new or enlarging lesion. No brain edema.  Electronically Signed: By: CFranchot GalloM.D. On: 005-31-1611:04   ASSESSMENT AND PLAN: This is a very pleasant 49years old white male with [name], adenocarcinoma with positive EGFR mutation in exon 19 currently undergoing treatment with oral Gilotrif 40 mg by mouth daily status post 10 months and tolerating his treatment fairly well except for the mild skin rash and paronychia. I advise him to  see podiatrist for evaluation of his paronychia. His CBC today showed no significant abnormalities. I recommended for him to continue his current treatment with Gilotrif at the same dose.  For the metastatic bone disease, the patient will continue with monthly Xgeva. He will also continue on calcium and vitamin D supplement. For the weight loss and depression, he is currently on Remeron 30 mg by mouth each bedtime and feels much better. He would come back for followup visit in one month with repeat CBC and comprehensive  metabolic panel as well as CT scan of the chest, abdomen and pelvis for restaging of his disease. He was advised to call immediately if he has any concerning symptoms in the interval. The patient voices understanding of current disease status and treatment options and is in agreement with the current care plan.  All questions were answered. The patient knows to call the clinic with any problems, questions or concerns. We can certainly see the patient much sooner if necessary.  Disclaimer: This note was dictated with voice recognition software. Similar sounding words can inadvertently be transcribed and may not be corrected upon review.

## 2015-02-05 NOTE — Telephone Encounter (Signed)
Pt confirmed labs/ov per 05/25 POF, gave pt AVS and Calendar.... KJ, gave pt barium, also pt request a certain day due to work.Marland KitchenMarland KitchenMarland Kitchen

## 2015-02-05 NOTE — Progress Notes (Signed)
Radiation Oncology         [phone]   Name: Oscar Floyd   Date: 02/05/2015   MRN: [id number]  DOB: 08-18-1966    Multidisciplinary [facility] Clinic Follow-Up Visit Note  CC: [name], MD  [name], MD  No diagnosis found.  Diagnosis:   49 year old gentleman with numerous subcentimeter brain metastases from EGFR positive adenocarcinoma the lung status post whole brain irradiation 04/01/2014-04/12/2014 to 30 Gy in 10 fractions of 3 Gy  Interval Since Last Radiation:  10 months  Narrative:  The patient returns today for routine follow-up.  The recent films were presented in our multidisciplinary conference with neuroradiology just prior to the clinic.  Continues to take Gilotrif daily. Reports associated side effects are diarrhea and sores on finger tips and toes. Reports occasional headaches. Continues to take Gilotrif daily. Reports associated side effects are diarrhea and sores on finger tips and toes. Reports rare headaches. Denies nausea, vomiting, dizziness, diplopia or ringing in the ears. Denies taking steroids at this time. Reports Remeron has been very helpful. Denies confusion or generalized weakness. Continues to work full time. Weight and vitals stable. Denies pain. Reports middle back pain when standing long periods of time.  ALLERGIES:  has No Known Allergies.  Meds: Current Outpatient Prescriptions  Medication Sig Dispense Refill  . afatinib dimaleate (GILOTRIF) 40 MG tablet Take 1 tablet (40 mg total) by mouth daily. Take on an empty stomach 1hr before or 2 hrs after meals. 30 tablet 1  . calcium-vitamin D (OSCAL) 250-125 MG-UNIT per tablet Take 1 tablet by mouth daily.    Marland Kitchen esomeprazole (NEXIUM) 20 MG capsule Take by mouth 2 (two) times daily before a meal.     . loperamide (IMODIUM) 2 MG capsule Take 2 mg by mouth as needed for diarrhea or loose stools.    . mirtazapine (REMERON) 30 MG tablet Take 1 tablet (30 mg total) by mouth at  bedtime. 30 tablet 2  . naproxen sodium (ANAPROX) 220 MG tablet Take 220 mg by mouth as needed.    . ALPRAZolam (XANAX) 1 MG tablet     . doxycycline (VIBRA-TABS) 100 MG tablet Take 1 tablet (100 mg total) by mouth 2 (two) times daily. (Patient not taking: Reported on 02/05/2015) 20 tablet 0   No current facility-administered medications for this encounter.    Physical Findings: The patient is in no acute distress. Patient is alert and oriented.  weight is 202 lb 1.6 oz (91.672 kg). His oral temperature is 98.1 F (36.7 C). His blood pressure is 145/88 and his pulse is 80. His respiration is 18 and oxygen saturation is 100%. .  No significant changes.  Lab Findings: Lab Results  Component Value Date   WBC 6.4 02/05/2015   HGB 14.3 02/05/2015   HCT 42.5 02/05/2015   MCV 89.2 02/05/2015   PLT 200 02/05/2015    @LASTCHEM @  Radiographic Findings: Mr [name] AY Contrast  01/13/2015   ADDENDUM REPORT: 01/13/2015 11:15  ADDENDUM: Metastatic deposit in the clivus is stable.   Electronically Signed   By: Franchot Gallo M.D.   On: 01/13/2015 11:15   01/13/2015   CLINICAL DATA:  Metastatic lung cancer.  Six month restaging  EXAM: MRI HEAD WITHOUT AND WITH CONTRAST  TECHNIQUE: Multiplanar, multiecho pulse sequences of the brain and surrounding structures were obtained without and with intravenous contrast.  CONTRAST:  63m MULTIHANCE GADOBENATE DIMEGLUMINE 529 MG/ML IV SOLN  COMPARISON:  MRI 10/25/2014, 07/18/2014  FINDINGS: Continued improvement in metastatic disease. Previously noted punctate areas of enhancement in the brain are now difficult to identify. Small cerebellar lesions seen previously not identified. No new or enlarging metastatic deposits identified. Leptomeningeal enhancement is within normal limits. No edema in the brain.  Ventricle size is normal. Negative for acute infarct. Negative for intracranial hemorrhage.  Mucosal edema paranasal sinuses most prominent left maxillary sinus.   IMPRESSION: Continued improvement in cerebral metastatic disease. Small punctate enhancing lesions are now very difficult to identify. No new or enlarging lesion. No brain edema.  Electronically Signed: By: Franchot Gallo M.D. On: 01/13/2015 11:04    Impression:  The patient is stable with no evidence of progressive or recurrent brain metastases at this time.  Plan:  MRI and follow-up in 3 months.   This document serves as a record of services personally performed by Tyler Pita, MD. It was created on his behalf by Lenn Cal, a trained medical scribe. The creation of this record is based on the scribe's personal observations and the provider's statements to them. This document has been checked and approved by the attending provider.   _____________________________________  Sheral Apley. Tammi Klippel, M.D.

## 2015-02-07 ENCOUNTER — Other Ambulatory Visit: Payer: Self-pay | Admitting: *Deleted

## 2015-02-07 DIAGNOSIS — C3491 Malignant neoplasm of unspecified part of right bronchus or lung: Secondary | ICD-10-CM

## 2015-02-07 MED ORDER — MIRTAZAPINE 30 MG PO TABS: 30.0000 mg | ORAL_TABLET | Freq: Every day | ORAL | Status: DC

## 2015-02-19 ENCOUNTER — Other Ambulatory Visit: Payer: Self-pay | Admitting: Medical Oncology

## 2015-02-19 ENCOUNTER — Telehealth: Payer: Self-pay | Admitting: *Deleted

## 2015-02-19 DIAGNOSIS — C3490 Malignant neoplasm of unspecified part of unspecified bronchus or lung: Secondary | ICD-10-CM

## 2015-02-19 MED ORDER — AFATINIB DIMALEATE 40 MG PO TABS: 40.0000 mg | ORAL_TABLET | Freq: Every day | ORAL | Status: DC

## 2015-02-19 NOTE — Telephone Encounter (Signed)
[facility] faxed Gilotrif refill request.  Request to provider's desk/in-basket for review.

## 2015-02-25 ENCOUNTER — Ambulatory Visit (INDEPENDENT_AMBULATORY_CARE_PROVIDER_SITE_OTHER): Payer: BLUE CROSS/BLUE SHIELD | Admitting: Podiatry

## 2015-02-25 DIAGNOSIS — L03039 Cellulitis of unspecified toe: Secondary | ICD-10-CM

## 2015-02-25 DIAGNOSIS — IMO0002 Reserved for concepts with insufficient information to code with codable children: Secondary | ICD-10-CM

## 2015-02-25 MED ORDER — AMOXICILLIN-POT CLAVULANATE 875-125 MG PO TABS: 1.0000 | ORAL_TABLET | Freq: Two times a day (BID) | ORAL | Status: DC

## 2015-02-25 MED ORDER — NEOMYCIN-POLYMYXIN-HC 3.5-10000-1 OT SOLN: OTIC | Status: DC

## 2015-02-25 NOTE — Progress Notes (Signed)
   Subjective:    Patient ID: Oscar Floyd, male    DOB: 1966-03-24, 49 y.o.   MRN: [id number]  HPI Comments: "I have problems with my toenails"  Patient c/o tender 1st toes bilateral (both borders), 2nd toe left (both borders), 3rd toe right (lateral border) for almost 1 year. The areas are red, swollen and draining. He is on chemo for lung cancer and the chemo causes the toenails to become ingrown. Tries to wear open shoes to air and soak in epsom salts and using new skin. Oncologist advised appointment.  Toe Pain       Review of Systems  Skin: Positive for wound.       Change in nails  All other systems reviewed and are negative.      Objective:   Physical Exam: I have reviewed his past medical history medications allergies surgery social history and review of systems. Pulses are strongly palpable bilateral. Neurologic sensorium is intact per Semmes-Weinstein monofilament. Deep tendon reflexes are intact bilateral muscle strength +5 over 5 dorsiflexion plantar flexors and inverters everters all intrinsic musculature is intact. Orthopedic evaluation of a straight awl joints distal to the ankle for range of motion without crepitation. Cutaneous evaluation of a straight supple well-hydrated cutis no erythema edema cellulitis drainage or odor with exception of his hallux nails tibial and fibular borders bilateral grossly infected due to ingrown nails. Fibular border fourth digit right foot and tibial and fibular border of the second digit left foot. This is associated with his chemotherapeutic agent for his lung cancer.        Assessment & Plan:  Assessment: Severe paronychia hallux bilateral second digit left fourth digit right associated with drugs and chemotherapies for lung cancer.  Plan: Discussed etiology pathology conservative versus surgical therapies. We performed incision and drainages today to all the toenails involved. This was performed after local anesthesia was achieved.  He tolerated procedure well and placed him on Augmentin 875 one by mouth twice a day and I will follow-up with him in 1 week just for recheck. I encouraged him to follow up with either the hand surgeons or dermatology for his ingrown nail on his thumb.

## 2015-02-25 NOTE — Patient Instructions (Signed)

## 2015-03-03 ENCOUNTER — Encounter (HOSPITAL_COMMUNITY): Payer: Self-pay

## 2015-03-03 ENCOUNTER — Ambulatory Visit (HOSPITAL_COMMUNITY)
Admission: RE | Admit: 2015-03-03 | Discharge: 2015-03-03 | Disposition: A | Discharge status: Home / Self Care | Payer: BLUE CROSS/BLUE SHIELD | Source: Ambulatory Visit | Attending: Internal Medicine | Admitting: Internal Medicine

## 2015-03-03 ENCOUNTER — Other Ambulatory Visit (HOSPITAL_BASED_OUTPATIENT_CLINIC_OR_DEPARTMENT_OTHER): Payer: BLUE CROSS/BLUE SHIELD

## 2015-03-03 DIAGNOSIS — C3491 Malignant neoplasm of unspecified part of right bronchus or lung: Secondary | ICD-10-CM | POA: Insufficient documentation

## 2015-03-03 DIAGNOSIS — C3411 Malignant neoplasm of upper lobe, right bronchus or lung: Secondary | ICD-10-CM

## 2015-03-03 DIAGNOSIS — C787 Secondary malignant neoplasm of liver and intrahepatic bile duct: Secondary | ICD-10-CM | POA: Insufficient documentation

## 2015-03-03 DIAGNOSIS — C7931 Secondary malignant neoplasm of brain: Secondary | ICD-10-CM | POA: Insufficient documentation

## 2015-03-03 DIAGNOSIS — C7951 Secondary malignant neoplasm of bone: Secondary | ICD-10-CM | POA: Diagnosis not present

## 2015-03-03 LAB — CBC WITH DIFFERENTIAL/PLATELET
BASO%: 0.4 % (ref 0.0–2.0)
BASOS ABS: 0 10*3/uL (ref 0.0–0.1)
EOS ABS: 0.2 10*3/uL (ref 0.0–0.5)
EOS%: 3.3 % (ref 0.0–7.0)
HEMATOCRIT: 40.8 % (ref 38.4–49.9)
HEMOGLOBIN: 14.2 g/dL (ref 13.0–17.1)
LYMPH#: 1.3 10*3/uL (ref 0.9–3.3)
LYMPH%: 24.7 % (ref 14.0–49.0)
MCH: 30.7 pg (ref 27.2–33.4)
MCHC: 34.8 g/dL (ref 32.0–36.0)
MCV: 88.3 fL (ref 79.3–98.0)
MONO#: 0.4 10*3/uL (ref 0.1–0.9)
MONO%: 6.5 % (ref 0.0–14.0)
NEUT#: 3.5 10*3/uL (ref 1.5–6.5)
NEUT%: 65.1 % (ref 39.0–75.0)
Platelets: 194 10*3/uL (ref 140–400)
RBC: 4.62 10*6/uL (ref 4.20–5.82)
RDW: 13 % (ref 11.0–14.6)
WBC: 5.4 10*3/uL (ref 4.0–10.3)

## 2015-03-03 LAB — COMPREHENSIVE METABOLIC PANEL (CC13)
ALK PHOS: 89 U/L (ref 40–150)
ALT: 21 U/L (ref 0–55)
ANION GAP: 6 meq/L (ref 3–11)
AST: 25 U/L (ref 5–34)
Albumin: 3.7 g/dL (ref 3.5–5.0)
BILIRUBIN TOTAL: 0.74 mg/dL (ref 0.20–1.20)
BUN: 9 mg/dL (ref 7.0–26.0)
CO2: 25 meq/L (ref 22–29)
CREATININE: 1 mg/dL (ref 0.7–1.3)
Calcium: 8.8 mg/dL (ref 8.4–10.4)
Chloride: 110 mEq/L — ABNORMAL HIGH (ref 98–109)
EGFR: 88 mL/min/{1.73_m2} — AB (ref >90)
Glucose: 89 mg/dl (ref 70–140)
Potassium: 4.3 mEq/L (ref 3.5–5.1)
Sodium: 141 mEq/L (ref 136–145)
TOTAL PROTEIN: 6.4 g/dL (ref 6.4–8.3)

## 2015-03-03 MED ORDER — IOHEXOL 300 MG/ML  SOLN
50.0000 mL | Freq: Once | INTRAMUSCULAR | Status: AC | PRN
  Administered 2015-03-03: 50 mL via ORAL

## 2015-03-03 MED ORDER — IOHEXOL 300 MG/ML  SOLN
100.0000 mL | Freq: Once | INTRAMUSCULAR | Status: AC | PRN
  Administered 2015-03-03: 100 mL via INTRAVENOUS

## 2015-03-04 ENCOUNTER — Ambulatory Visit (INDEPENDENT_AMBULATORY_CARE_PROVIDER_SITE_OTHER): Payer: BLUE CROSS/BLUE SHIELD | Admitting: Podiatry

## 2015-03-04 ENCOUNTER — Encounter: Payer: Self-pay | Admitting: Podiatry

## 2015-03-04 DIAGNOSIS — L03019 Cellulitis of unspecified finger: Secondary | ICD-10-CM

## 2015-03-04 DIAGNOSIS — IMO0002 Reserved for concepts with insufficient information to code with codable children: Secondary | ICD-10-CM

## 2015-03-05 ENCOUNTER — Telehealth: Payer: Self-pay | Admitting: Internal Medicine

## 2015-03-05 ENCOUNTER — Ambulatory Visit (HOSPITAL_BASED_OUTPATIENT_CLINIC_OR_DEPARTMENT_OTHER): Payer: BLUE CROSS/BLUE SHIELD | Admitting: Internal Medicine

## 2015-03-05 ENCOUNTER — Encounter: Payer: Self-pay | Admitting: Internal Medicine

## 2015-03-05 VITALS — BP 144/88 | HR 73 | Temp 98.4°F | Resp 18 | Ht 74.0 in | Wt 203.3 lb

## 2015-03-05 DIAGNOSIS — L270 Generalized skin eruption due to drugs and medicaments taken internally: Secondary | ICD-10-CM

## 2015-03-05 DIAGNOSIS — C7949 Secondary malignant neoplasm of other parts of nervous system: Secondary | ICD-10-CM

## 2015-03-05 DIAGNOSIS — C3411 Malignant neoplasm of upper lobe, right bronchus or lung: Secondary | ICD-10-CM | POA: Diagnosis not present

## 2015-03-05 DIAGNOSIS — L03019 Cellulitis of unspecified finger: Secondary | ICD-10-CM

## 2015-03-05 DIAGNOSIS — C7931 Secondary malignant neoplasm of brain: Secondary | ICD-10-CM | POA: Diagnosis not present

## 2015-03-05 DIAGNOSIS — C787 Secondary malignant neoplasm of liver and intrahepatic bile duct: Secondary | ICD-10-CM

## 2015-03-05 DIAGNOSIS — C7951 Secondary malignant neoplasm of bone: Secondary | ICD-10-CM

## 2015-03-05 DIAGNOSIS — K521 Toxic gastroenteritis and colitis: Secondary | ICD-10-CM

## 2015-03-05 DIAGNOSIS — R634 Abnormal weight loss: Secondary | ICD-10-CM

## 2015-03-05 DIAGNOSIS — F329 Major depressive disorder, single episode, unspecified: Secondary | ICD-10-CM

## 2015-03-05 DIAGNOSIS — R21 Rash and other nonspecific skin eruption: Secondary | ICD-10-CM

## 2015-03-05 DIAGNOSIS — C3491 Malignant neoplasm of unspecified part of right bronchus or lung: Secondary | ICD-10-CM

## 2015-03-05 NOTE — Progress Notes (Signed)
He presents today for follow-up of multiple incision and drainage to multiple toes. He states that he was unable to utilize his anti-bionics because they were mail ordered. He states he continues to play golf and continues to soak the toes daily.  Objective: Vital signs are stable he is alert and oriented 3 mild granuloma to the tibial border of the hallux right is still present. The remainder of the nails. Be healing quite nicely.  Assessment: Well-healing paronychias bilateral.  Plan: Suggest that he get started on his anti-biotics immediately. And we'll follow-up with him in a couple weeks. I did suggest that we may need to perform another cleanout to that hallux.

## 2015-03-05 NOTE — Telephone Encounter (Signed)
Gave and printed appt sched and avs for pt for Aug °

## 2015-03-05 NOTE — Progress Notes (Signed)
[facility] Telephone:(336) [phone]   Fax:([phone]  OFFICE PROGRESS NOTE  Oscar Inches, MD [address] [place] [address]  DIAGNOSIS: stage IV (T2a, N0, M1b) non-small cell lung cancer consistent with poorly differentiated adenocarcinoma with positive EGFR mutation with deletion in exon 19, diagnosed in July of 2015 and presented with right upper lobe lung mass in addition to extensive liver, brain and bone metastases.  Primary site: Lung (Right)  Staging method: AJCC 7th Edition  Clinical: Stage IV (T2a, N0, M1b) signed by [name], MD on 03/26/2014 4:57 PM  Summary: Stage IV (T2a, N0, M1b)   PRIOR THERAPY: Status post whole brain irradiation under the care Dr. Tammi Klippel completed 04/12/2014   CURRENT THERAPY:  1) Gilotrif 40 mg po daily - therapy beginning 04/03/2014. Status post approximately 11 months of therapy  2) Xgeva 120 mcg subcutaneously on monthly basis.   DISEASE STAGE:  Lung cancer  Primary site: Lung (Right)  Staging method: AJCC 7th Edition  Clinical: Stage IV (T2a, N0, M1b) signed by [name], MD on 03/26/2014 4:57 PM  Summary: Stage IV (T2a, N0, M1b)  CHEMOTHERAPY INTENT: palliative  CURRENT # OF CHEMOTHERAPY CYCLES: 12  CURRENT ANTIEMETICS: none  CURRENT SMOKING STATUS: non-smoker/never smoker  ORAL CHEMOTHERAPY AND CONSENT: yes- Gilotrif  CURRENT BISPHOSPHONATES USE: none  PAIN MANAGEMENT: none  NARCOTICS INDUCED CONSTIPATION: none  LIVING WILL AND CODE STATUS:   INTERVAL HISTORY: Oscar Floyd 49 y.o. male returns to the clinic today for followup visit. The patient is tolerating his treatment with Gilotrif fairly well with no significant adverse effects except for mild skin rash on the nose, paronychia and occasional episodes of diarrhea. He was seen recently by podiatrist and currently treated with Augmentin after surgical debridement of the big toes lesions. It looks a little  bit better. The patient denied having any significant chest pain, shortness of breath, cough or hemoptysis. He has no fever or chills, no nausea or vomiting. He is here today for evaluation and repeat CBC and comprehensive metabolic panel as well as CT scan of the chest, abdomen and pelvis for restaging of his disease.   MEDICAL HISTORY: Past Medical History  Diagnosis Date  . Lung cancer     RUL lung with mets to liver, bone and brain  . Hypertension     ALLERGIES:  has No Known Allergies.  MEDICATIONS:  Current Outpatient Prescriptions  Medication Sig Dispense Refill  . afatinib dimaleate (GILOTRIF) 40 MG tablet Take 1 tablet (40 mg total) by mouth daily. Take on an empty stomach 1hr before or 2 hrs after meals. 30 tablet 1  . ALPRAZolam (XANAX) 1 MG tablet     . amoxicillin-clavulanate (AUGMENTIN) 875-125 MG per tablet Take 1 tablet by mouth 2 (two) times daily. 30 tablet 1  . calcium-vitamin D (OSCAL) 250-125 MG-UNIT per tablet Take 1 tablet by mouth daily.    Marland Kitchen doxycycline (VIBRA-TABS) 100 MG tablet Take 1 tablet (100 mg total) by mouth 2 (two) times daily. 20 tablet 0  . esomeprazole (NEXIUM) 20 MG capsule Take by mouth 2 (two) times daily before a meal.     . loperamide (IMODIUM) 2 MG capsule Take 2 mg by mouth as needed for diarrhea or loose stools.    . mirtazapine (REMERON) 30 MG tablet Take 1 tablet (30 mg total) by mouth at bedtime. 30 tablet 2  . naproxen sodium (ANAPROX) 220 MG tablet Take 220  mg by mouth as needed.    . neomycin-polymyxin-hydrocortisone (CORTISPORIN) otic solution Apply one to two drops to toe after soaking twice daily. 10 mL 0   No current facility-administered medications for this visit.    SURGICAL HISTORY:  Past Surgical History  Procedure Laterality Date  . Vasectomy    . Video bronchoscopy Bilateral 03/20/2014    Procedure: VIDEO BRONCHOSCOPY WITH FLUORO;  Surgeon: Rigoberto Noel, MD;  Location: [facility];  Service: Cardiopulmonary;  Laterality:  Bilateral;    REVIEW OF SYSTEMS:  Constitutional: negative Eyes: negative Ears, nose, mouth, throat, and face: negative Respiratory: negative Cardiovascular: negative Gastrointestinal: negative Genitourinary:negative Integument/breast: positive for dryness and rash Hematologic/lymphatic: negative Musculoskeletal:negative Neurological: negative Behavioral/Psych: negative Endocrine: negative Allergic/Immunologic: negative   PHYSICAL EXAMINATION: General appearance: alert, cooperative and no distress Head: Normocephalic, without obvious abnormality, atraumatic Neck: no adenopathy, no JVD, supple, symmetrical, trachea midline and thyroid not enlarged, symmetric, no tenderness/mass/nodules Lymph nodes: Cervical, supraclavicular, and axillary nodes normal. Resp: clear to auscultation bilaterally Back: symmetric, no curvature. ROM normal. No CVA tenderness. Cardio: regular rate and rhythm, S1, S2 normal, no murmur, click, rub or gallop GI: soft, non-tender; bowel sounds normal; no masses,  no organomegaly Extremities: extremities normal, atraumatic, no cyanosis or edema Neurologic: Alert and oriented X 3, normal strength and tone. Normal symmetric reflexes. Normal coordination and gait  ECOG PERFORMANCE STATUS: 1 - Symptomatic but completely ambulatory  Blood pressure 144/88, pulse 73, temperature 98.4 F (36.9 C), temperature source Oral, resp. rate 18, height 6' 2"  (1.88 m), weight 203 lb 4.8 oz (92.216 kg), SpO2 100 %.  LABORATORY DATA: Lab Results  Component Value Date   WBC 5.4 03/03/2015   HGB 14.2 03/03/2015   HCT 40.8 03/03/2015   MCV 88.3 03/03/2015   PLT 194 03/03/2015      Chemistry      Component Value Date/Time   NA 141 03/03/2015 0907   K 4.3 03/03/2015 0907   CO2 25 03/03/2015 0907   BUN 9.0 03/03/2015 0907   CREATININE 1.0 03/03/2015 0907      Component Value Date/Time   CALCIUM 8.8 03/03/2015 0907   ALKPHOS 89 03/03/2015 0907   AST 25 03/03/2015 0907     ALT 21 03/03/2015 0907   BILITOT 0.74 03/03/2015 0907       RADIOGRAPHIC STUDIES: Ct Chest W Contrast  03/03/2015   CLINICAL DATA:  Metastatic lung cancer to liver brain and bone.  EXAM: CT CHEST, ABDOMEN, AND PELVIS WITH CONTRAST  TECHNIQUE: Multidetector CT imaging of the chest, abdomen and pelvis was performed following the standard protocol during bolus administration of intravenous contrast.  CONTRAST:  196m OMNIPAQUE IOHEXOL 300 MG/ML  SOLN  COMPARISON:  11/19/2014  FINDINGS: CT CHEST FINDINGS  Mediastinum: Heart size is normal. No pericardial effusion. No mediastinal or hilar adenopathy. The trachea appears patent and is midline. Normal appearance of the esophagus.  Lungs/Pleura: No pleural effusion. No airspace consolidation identified. Numerous small pulmonary nodules are scattered throughout the lungs. The index lesion within the right upper lobe measures 1.6 x 1.0 cm, image 31/ series 5. On the previous examination this measured 1.7 x 0.9 cm. Pulmonary nodule within the left upper lobe measures 6 mm, image 9/series 5. This is unchanged from previous exam.  Musculoskeletal: There is diffuse sclerotic bone lesions identified when compared with the previous exam the degree of sclerotic bone metastasis appears unchanged.  CT ABDOMEN AND PELVIS FINDINGS  Hepatobiliary: There is no suspicious liver abnormality. The gallbladder appears  normal. No biliary dilatation.  Pancreas: Normal appearance of the pancreas.  Spleen: The spleen is negative.  Adrenals/Urinary Tract: The adrenal glands are both on unremarkable. Normal appearance of both kidneys. The urinary bladder is normal.  Stomach/Bowel: The stomach and the small bowel loops have a normal course and caliber without obstruction. Normal appearance of the colon.  Vascular/Lymphatic: Normal appearance of the abdominal aorta. No enlarged retroperitoneal or mesenteric adenopathy. No enlarged pelvic or inguinal lymph nodes.  Reproductive: Prostate  gland and seminal vesicles are unremarkable.  Other: No free fluid or fluid collections within the abdomen or pelvis.  Musculoskeletal: Numerous sclerotic bone metastasis are identified involving the bony pelvis. The appearance is unchanged from the previous exam.  IMPRESSION: 1. No significant change on today's exam as compared with study from 11/19/2014. The right upper lobe pulmonary nodule and other tiny nodules in the lungs bilaterally are similar to the prior study. Additionally, 2. Stable treated osseous metastasis.   Electronically Signed   By: Kerby Moors M.D.   On: 03/03/2015 13:39   Ct Abdomen Pelvis W Contrast  03/03/2015   CLINICAL DATA:  Metastatic lung cancer to liver brain and bone.  EXAM: CT CHEST, ABDOMEN, AND PELVIS WITH CONTRAST  TECHNIQUE: Multidetector CT imaging of the chest, abdomen and pelvis was performed following the standard protocol during bolus administration of intravenous contrast.  CONTRAST:  152m OMNIPAQUE IOHEXOL 300 MG/ML  SOLN  COMPARISON:  11/19/2014  FINDINGS: CT CHEST FINDINGS  Mediastinum: Heart size is normal. No pericardial effusion. No mediastinal or hilar adenopathy. The trachea appears patent and is midline. Normal appearance of the esophagus.  Lungs/Pleura: No pleural effusion. No airspace consolidation identified. Numerous small pulmonary nodules are scattered throughout the lungs. The index lesion within the right upper lobe measures 1.6 x 1.0 cm, image 31/ series 5. On the previous examination this measured 1.7 x 0.9 cm. Pulmonary nodule within the left upper lobe measures 6 mm, image 9/series 5. This is unchanged from previous exam.  Musculoskeletal: There is diffuse sclerotic bone lesions identified when compared with the previous exam the degree of sclerotic bone metastasis appears unchanged.  CT ABDOMEN AND PELVIS FINDINGS  Hepatobiliary: There is no suspicious liver abnormality. The gallbladder appears normal. No biliary dilatation.  Pancreas: Normal  appearance of the pancreas.  Spleen: The spleen is negative.  Adrenals/Urinary Tract: The adrenal glands are both on unremarkable. Normal appearance of both kidneys. The urinary bladder is normal.  Stomach/Bowel: The stomach and the small bowel loops have a normal course and caliber without obstruction. Normal appearance of the colon.  Vascular/Lymphatic: Normal appearance of the abdominal aorta. No enlarged retroperitoneal or mesenteric adenopathy. No enlarged pelvic or inguinal lymph nodes.  Reproductive: Prostate gland and seminal vesicles are unremarkable.  Other: No free fluid or fluid collections within the abdomen or pelvis.  Musculoskeletal: Numerous sclerotic bone metastasis are identified involving the bony pelvis. The appearance is unchanged from the previous exam.  IMPRESSION: 1. No significant change on today's exam as compared with study from 11/19/2014. The right upper lobe pulmonary nodule and other tiny nodules in the lungs bilaterally are similar to the prior study. Additionally, 2. Stable treated osseous metastasis.   Electronically Signed   By: TKerby MoorsM.D.   On: 03/03/2015 13:39   ASSESSMENT AND PLAN: This is a very pleasant 49years old white male with [name], adenocarcinoma with positive EGFR mutation in exon 19 currently undergoing treatment with oral Gilotrif 40  mg by mouth daily status post 11 months and tolerating his treatment fairly well except for the mild skin rash and paronychia.  His recent CT scan of the chest, abdomen and pelvis showed stable disease with no evidence for progression. I discussed the scan results with the patient today. I recommended for him to continue his current treatment with Gilotrif at the same dose but also offered him to reduce the dose to 30 mg if no improvement in his paronychia after the intervention by the podiatrist.  For the metastatic bone disease, the patient will continue with monthly Xgeva. He will also  continue on calcium and vitamin D supplement. For the weight loss and depression, he is currently on Remeron 30 mg by mouth each bedtime and feels much better. He would come back for followup visit in 6 weeks with repeat CBC and comprehensive metabolic panel. He was advised to call immediately if he has any concerning symptoms in the interval. The patient voices understanding of current disease status and treatment options and is in agreement with the current care plan.  All questions were answered. The patient knows to call the clinic with any problems, questions or concerns. We can certainly see the patient much sooner if necessary.  Disclaimer: This note was dictated with voice recognition software. Similar sounding words can inadvertently be transcribed and may not be corrected upon review.

## 2015-03-11 ENCOUNTER — Other Ambulatory Visit: Payer: Self-pay | Admitting: Internal Medicine

## 2015-03-11 DIAGNOSIS — C3480 Malignant neoplasm of overlapping sites of unspecified bronchus and lung: Secondary | ICD-10-CM

## 2015-03-18 ENCOUNTER — Encounter: Payer: Self-pay | Admitting: Podiatry

## 2015-03-18 ENCOUNTER — Ambulatory Visit (INDEPENDENT_AMBULATORY_CARE_PROVIDER_SITE_OTHER): Payer: BLUE CROSS/BLUE SHIELD | Admitting: Podiatry

## 2015-03-18 DIAGNOSIS — L03039 Cellulitis of unspecified toe: Secondary | ICD-10-CM

## 2015-03-18 DIAGNOSIS — IMO0002 Reserved for concepts with insufficient information to code with codable children: Secondary | ICD-10-CM

## 2015-03-18 NOTE — Progress Notes (Signed)
This patient presents today for follow-up I & D procedures hallux bilateral. Relating no complaints.  Objective: Vital signs are stable. Secondly site appears to be healing well without erythema or drainage purulence or odor.  Assessment: Well-healing surgical sites without complications.  Plan: Currently using epsom salts and water twice daily and cortisporin otic drops. Continue covering during the day leaving it open at night time. They will continue to soak and cover the toe until completely well. They will continue to watch the toe for signs and symptoms of infection should any arise we will be notified immediately. Follow-up when necessary.

## 2015-04-01 ENCOUNTER — Telehealth: Payer: Self-pay | Admitting: *Deleted

## 2015-04-01 NOTE — Telephone Encounter (Signed)
Oncology Nurse Navigator Documentation  Oncology Nurse Navigator Flowsheets 04/01/2015  Navigator Encounter Type Telephone/patient called and left me a vm message stating insurance will not cover [employer] test.  I contacted the [employer] rep.  He sent me an application to apply for assistance.  I called and left patient a vm message stating he can come and fill out application and I can fax.  I stated I would leave form at front desk.  I also left my name and phone number to call if needed.   Patient Visit Type Follow-up  Treatment Phase Treatment  Barriers/Navigation Needs Financial  Interventions Coordination of Care;Referrals  Time Spent with Patient 30

## 2015-04-01 NOTE — Telephone Encounter (Signed)
Oncology Nurse Navigator Documentation  Oncology Nurse Navigator Flowsheets 04/01/2015  Navigator Encounter Type Telephone/Patient called back.  He explained that he needs a letter from Dr. Julien Nordmann to insurance patient's HR dept.  I listened as he explained.  I asked that he email me with what he needs form HR and I will write the letter and fax or have at cancer center for patient.  He stated he would email me more information and what he needed.    Patient Visit Type Follow-up  Treatment Phase Treatment  Barriers/Navigation Needs Financial  Interventions Coordination of Care  Time Spent with Patient 15

## 2015-04-14 ENCOUNTER — Encounter: Payer: Self-pay | Admitting: Radiation Therapy

## 2015-04-14 ENCOUNTER — Other Ambulatory Visit: Payer: Self-pay | Admitting: Radiation Therapy

## 2015-04-14 DIAGNOSIS — C7931 Secondary malignant neoplasm of brain: Secondary | ICD-10-CM

## 2015-04-16 ENCOUNTER — Other Ambulatory Visit (HOSPITAL_BASED_OUTPATIENT_CLINIC_OR_DEPARTMENT_OTHER): Payer: BLUE CROSS/BLUE SHIELD

## 2015-04-16 ENCOUNTER — Encounter: Payer: Self-pay | Admitting: Radiation Therapy

## 2015-04-16 ENCOUNTER — Ambulatory Visit (HOSPITAL_BASED_OUTPATIENT_CLINIC_OR_DEPARTMENT_OTHER): Payer: BLUE CROSS/BLUE SHIELD | Admitting: Internal Medicine

## 2015-04-16 ENCOUNTER — Telehealth: Payer: Self-pay | Admitting: Internal Medicine

## 2015-04-16 ENCOUNTER — Encounter: Payer: Self-pay | Admitting: *Deleted

## 2015-04-16 ENCOUNTER — Encounter: Payer: Self-pay | Admitting: Internal Medicine

## 2015-04-16 VITALS — BP 140/92 | HR 70 | Temp 98.4°F | Resp 18 | Ht 74.0 in | Wt 201.9 lb

## 2015-04-16 DIAGNOSIS — C7931 Secondary malignant neoplasm of brain: Secondary | ICD-10-CM

## 2015-04-16 DIAGNOSIS — C7949 Secondary malignant neoplasm of other parts of nervous system: Secondary | ICD-10-CM

## 2015-04-16 DIAGNOSIS — L03019 Cellulitis of unspecified finger: Secondary | ICD-10-CM

## 2015-04-16 DIAGNOSIS — C3411 Malignant neoplasm of upper lobe, right bronchus or lung: Secondary | ICD-10-CM | POA: Diagnosis not present

## 2015-04-16 DIAGNOSIS — C7951 Secondary malignant neoplasm of bone: Secondary | ICD-10-CM

## 2015-04-16 DIAGNOSIS — C3491 Malignant neoplasm of unspecified part of right bronchus or lung: Secondary | ICD-10-CM

## 2015-04-16 DIAGNOSIS — K521 Toxic gastroenteritis and colitis: Secondary | ICD-10-CM

## 2015-04-16 DIAGNOSIS — C787 Secondary malignant neoplasm of liver and intrahepatic bile duct: Secondary | ICD-10-CM | POA: Diagnosis not present

## 2015-04-16 DIAGNOSIS — L270 Generalized skin eruption due to drugs and medicaments taken internally: Secondary | ICD-10-CM

## 2015-04-16 DIAGNOSIS — R634 Abnormal weight loss: Secondary | ICD-10-CM

## 2015-04-16 DIAGNOSIS — F329 Major depressive disorder, single episode, unspecified: Secondary | ICD-10-CM

## 2015-04-16 LAB — COMPREHENSIVE METABOLIC PANEL (CC13)
ALBUMIN: 3.6 g/dL (ref 3.5–5.0)
ALK PHOS: 85 U/L (ref 40–150)
ALT: 31 U/L (ref 0–55)
AST: 27 U/L (ref 5–34)
Anion Gap: 5 mEq/L (ref 3–11)
BUN: 10.2 mg/dL (ref 7.0–26.0)
CALCIUM: 9 mg/dL (ref 8.4–10.4)
CO2: 28 mEq/L (ref 22–29)
Chloride: 108 mEq/L (ref 98–109)
Creatinine: 1 mg/dL (ref 0.7–1.3)
EGFR: 86 mL/min/{1.73_m2} — AB (ref >90)
Glucose: 83 mg/dl (ref 70–140)
Potassium: 4 mEq/L (ref 3.5–5.1)
SODIUM: 141 meq/L (ref 136–145)
TOTAL PROTEIN: 6.3 g/dL — AB (ref 6.4–8.3)
Total Bilirubin: 0.96 mg/dL (ref 0.20–1.20)

## 2015-04-16 LAB — CBC WITH DIFFERENTIAL/PLATELET
BASO%: 0.8 % (ref 0.0–2.0)
Basophils Absolute: 0 10*3/uL (ref 0.0–0.1)
EOS%: 4.2 % (ref 0.0–7.0)
Eosinophils Absolute: 0.2 10*3/uL (ref 0.0–0.5)
HCT: 40.3 % (ref 38.4–49.9)
HEMOGLOBIN: 13.8 g/dL (ref 13.0–17.1)
LYMPH#: 1 10*3/uL (ref 0.9–3.3)
LYMPH%: 22.4 % (ref 14.0–49.0)
MCH: 30.5 pg (ref 27.2–33.4)
MCHC: 34.2 g/dL (ref 32.0–36.0)
MCV: 89 fL (ref 79.3–98.0)
MONO#: 0.4 10*3/uL (ref 0.1–0.9)
MONO%: 7.6 % (ref 0.0–14.0)
NEUT#: 3 10*3/uL (ref 1.5–6.5)
NEUT%: 65 % (ref 39.0–75.0)
Platelets: 184 10*3/uL (ref 140–400)
RBC: 4.53 10*6/uL (ref 4.20–5.82)
RDW: 13.3 % (ref 11.0–14.6)
WBC: 4.6 10*3/uL (ref 4.0–10.3)

## 2015-04-16 NOTE — Progress Notes (Signed)
1.  Do you need a wheel chair?  no    2. On oxygen? no  3. Have you ever had any surgery in the body part being scanned? no  4. Have you ever had any surgery on your brain or heart?             no 5. Have you ever had surgery on your eyes or ears?                   no 6. Do you have a pacemaker or defibrillator?   no  7. Do you have a Neurostimulator?     no         8. Claustrophobic? Yes and asked that we call something in for the scan. Dr. Tammi Klippel to handle. Still needs 3T SRS Protocol on [address]   9. Any risk for metal in eyes?  no  10. Injury by bullet, buckshot, or shrapnel? bo  11. Stent?         no                                                                                                                12. Hx of Cancer?    Yes, Lung primary with mets to the brain                                                                                                            13. Kidney or Liver disease?  no  14. Hx of Lupus, Rheumatoid Arthritis or Scleroderma?  no  15. IV Antibiotics or long term use of NSAIDS?  no  16. HX of Hypertension?  no  17. Diabetes? no  18. Allergy to contrast?  no  19. Recent labs. Drawn on 8/3    Mont Dutton R.T. (R) (T) [facility] [place] [phone] Office [phone] Pager [phone] Fax [email or web address]

## 2015-04-16 NOTE — Progress Notes (Signed)
Oncology Nurse Navigator Documentation  Oncology Nurse Navigator Flowsheets 04/16/2015  Navigator Encounter Type Other/Followed up with patient today during office visit.  He is doing well no concerns.  I did ask him about insurance covering genetic testing and if that had been resolved.  He stated yes, it has been resolved.    Patient Visit Type Medonc  Treatment Phase Treatment  Barriers/Navigation Needs No barriers at this time  Time Spent with Patient 15

## 2015-04-16 NOTE — Telephone Encounter (Signed)
per pof to sch pt appt-gave pt copy of avs-pt req water [facility]-adv [facility] will call-adv to let them know-per melissa to sch Xgeva after 8/10

## 2015-04-16 NOTE — Progress Notes (Signed)
[facility] Telephone:([phone]   Fax:(336) [phone]  OFFICE PROGRESS NOTE  [name], MD [address] [place] [address]  DIAGNOSIS: stage IV (T2a, N0, M1b) non-small cell lung cancer consistent with poorly differentiated adenocarcinoma with positive EGFR mutation with deletion in exon 19, diagnosed in July of 2015 and presented with right upper lobe lung mass in addition to extensive liver, brain and bone metastases.  Primary site: Lung (Right)  Staging method: AJCC 7th Edition  Clinical: Stage IV (T2a, N0, M1b) signed by [name], MD on 03/26/2014 4:57 PM  Summary: Stage IV (T2a, N0, M1b)   PRIOR THERAPY: Status post whole brain irradiation under the care Dr. Tammi Klippel completed 04/12/2014   CURRENT THERAPY:  1) Gilotrif 40 mg po daily - therapy beginning 04/03/2014. Status post approximately 11 months of therapy  2) Xgeva 120 mcg subcutaneously on monthly basis.   DISEASE STAGE:  Lung cancer  Primary site: Lung (Right)  Staging method: AJCC 7th Edition  Clinical: Stage IV (T2a, N0, M1b) signed by [name], MD on 03/26/2014 4:57 PM  Summary: Stage IV (T2a, N0, M1b)  CHEMOTHERAPY INTENT: palliative  CURRENT # OF CHEMOTHERAPY CYCLES: 12  CURRENT ANTIEMETICS: none  CURRENT SMOKING STATUS: non-smoker/never smoker  ORAL CHEMOTHERAPY AND CONSENT: yes- Gilotrif  CURRENT BISPHOSPHONATES USE: none  PAIN MANAGEMENT: none  NARCOTICS INDUCED CONSTIPATION: none  LIVING WILL AND CODE STATUS:   INTERVAL HISTORY: Oscar Floyd 49 y.o. male returns to the clinic today for followup visit. The patient is tolerating his treatment with Gilotrif fairly well with no significant adverse effects except for the persistent mild skin rash on the nose, paronychia and occasional episodes of diarrhea. The patient denied having any significant chest pain, shortness of breath, cough or hemoptysis. He has no fever or chills, no  nausea or vomiting. He is here today for evaluation and repeat CBC and comprehensive metabolic panel.  MEDICAL HISTORY: Past Medical History  Diagnosis Date  . Lung cancer     RUL lung with mets to liver, bone and brain  . Hypertension     ALLERGIES:  has No Known Allergies.  MEDICATIONS:  Current Outpatient Prescriptions  Medication Sig Dispense Refill  . afatinib dimaleate (GILOTRIF) 40 MG tablet Take 1 tablet (40 mg total) by mouth daily. Take on an empty stomach 1hr before or 2 hrs after meals. 30 tablet 1  . ALPRAZolam (XANAX) 1 MG tablet     . amoxicillin-clavulanate (AUGMENTIN) 875-125 MG per tablet Take 1 tablet by mouth 2 (two) times daily. 30 tablet 1  . calcium-vitamin D (OSCAL) 250-125 MG-UNIT per tablet Take 1 tablet by mouth daily.    Marland Kitchen doxycycline (VIBRA-TABS) 100 MG tablet Take 1 tablet (100 mg total) by mouth 2 (two) times daily. 20 tablet 0  . esomeprazole (NEXIUM) 20 MG capsule Take by mouth 2 (two) times daily before a meal.     . loperamide (IMODIUM) 2 MG capsule Take 2 mg by mouth as needed for diarrhea or loose stools.    . mirtazapine (REMERON) 30 MG tablet Take 1 tablet (30 mg total) by mouth at bedtime. 30 tablet 2  . mirtazapine (REMERON) 30 MG tablet TAKE 1 TABLET (30 MG TOTAL) BY MOUTH AT BEDTIME. 30 tablet 2  . naproxen sodium (ANAPROX) 220 MG tablet Take 220 mg by mouth as needed.    . neomycin-polymyxin-hydrocortisone (CORTISPORIN) otic solution Apply one to two drops to toe  after soaking twice daily. 10 mL 0   No current facility-administered medications for this visit.    SURGICAL HISTORY:  Past Surgical History  Procedure Laterality Date  . Vasectomy    . Video bronchoscopy Bilateral 03/20/2014    Procedure: VIDEO BRONCHOSCOPY WITH FLUORO;  Surgeon: Rigoberto Noel, MD;  Location: [facility];  Service: Cardiopulmonary;  Laterality: Bilateral;    REVIEW OF SYSTEMS:  Constitutional: negative Eyes: negative Ears, nose, mouth, throat, and face:  negative Respiratory: negative Cardiovascular: negative Gastrointestinal: negative Genitourinary:negative Integument/breast: positive for dryness and rash Hematologic/lymphatic: negative Musculoskeletal:negative Neurological: negative Behavioral/Psych: negative Endocrine: negative Allergic/Immunologic: negative   PHYSICAL EXAMINATION: General appearance: alert, cooperative and no distress Head: Normocephalic, without obvious abnormality, atraumatic Neck: no adenopathy, no JVD, supple, symmetrical, trachea midline and thyroid not enlarged, symmetric, no tenderness/mass/nodules Lymph nodes: Cervical, supraclavicular, and axillary nodes normal. Resp: clear to auscultation bilaterally Back: symmetric, no curvature. ROM normal. No CVA tenderness. Cardio: regular rate and rhythm, S1, S2 normal, no murmur, click, rub or gallop GI: soft, non-tender; bowel sounds normal; no masses,  no organomegaly Extremities: extremities normal, atraumatic, no cyanosis or edema Neurologic: Alert and oriented X 3, normal strength and tone. Normal symmetric reflexes. Normal coordination and gait  ECOG PERFORMANCE STATUS: 1 - Symptomatic but completely ambulatory  Blood pressure 140/92, pulse 70, temperature 98.4 F (36.9 C), resp. rate 18, height $RemoveBe'6\' 2"'JXjHlnsEq$  (1.88 m), weight 201 lb 14.4 oz (91.581 kg), SpO2 100 %.  LABORATORY DATA: Lab Results  Component Value Date   WBC 4.6 04/16/2015   HGB 13.8 04/16/2015   HCT 40.3 04/16/2015   MCV 89.0 04/16/2015   PLT 184 04/16/2015      Chemistry      Component Value Date/Time   NA 141 03/03/2015 0907   K 4.3 03/03/2015 0907   CO2 25 03/03/2015 0907   BUN 9.0 03/03/2015 0907   CREATININE 1.0 03/03/2015 0907      Component Value Date/Time   CALCIUM 8.8 03/03/2015 0907   ALKPHOS 89 03/03/2015 0907   AST 25 03/03/2015 0907   ALT 21 03/03/2015 0907   BILITOT 0.74 03/03/2015 0907       RADIOGRAPHIC STUDIES: No results found. ASSESSMENT AND PLAN: This is  a very pleasant 49 years old white male with [name], adenocarcinoma with positive EGFR mutation in exon 19 currently undergoing treatment with oral Gilotrif 40 mg by mouth daily status post 13 months and tolerating his treatment fairly well except for the mild skin rash and paronychia.  I recommended for him to continue his current treatment with Gilotrif at the same dose. For the metastatic bone disease, the patient will continue with monthly Xgeva. He will also continue on calcium and vitamin D supplement. For the weight loss and depression, he is currently on Remeron 30 mg by mouth each bedtime and feels much better. He would come back for followup visit in 6 weeks with repeat CBC and comprehensive metabolic panel in addition to CT scan of the chest, abdomen and pelvis for restaging of his disease. He was advised to call immediately if he has any concerning symptoms in the interval. The patient voices understanding of current disease status and treatment options and is in agreement with the current care plan.  All questions were answered. The patient knows to call the clinic with any problems, questions or concerns. We can certainly see the patient much sooner if necessary.  Disclaimer: This note was dictated with voice recognition  software. Similar sounding words can inadvertently be transcribed and may not be corrected upon review.

## 2015-04-22 ENCOUNTER — Other Ambulatory Visit: Payer: Self-pay | Admitting: Internal Medicine

## 2015-04-24 ENCOUNTER — Telehealth: Payer: Self-pay | Admitting: Radiation Oncology

## 2015-04-24 ENCOUNTER — Other Ambulatory Visit: Payer: Self-pay | Admitting: Radiation Oncology

## 2015-04-24 ENCOUNTER — Other Ambulatory Visit: Payer: Self-pay | Admitting: *Deleted

## 2015-04-24 DIAGNOSIS — C3490 Malignant neoplasm of unspecified part of unspecified bronchus or lung: Secondary | ICD-10-CM

## 2015-04-24 MED ORDER — AFATINIB DIMALEATE 40 MG PO TABS: 40.0000 mg | ORAL_TABLET | Freq: Every day | ORAL | Status: DC

## 2015-04-24 NOTE — Telephone Encounter (Signed)
Correction of prior entry: Patient has been prescribed valium 10 mg in the past not xanax. Per Dr. Johny Shears order called in valium 10 mg thirty minutes prior to procedure, one tablet, no refills. Spoke with Abbe Amsterdam at [facility]. Phoned patient making him aware this was done.

## 2015-04-24 NOTE — Telephone Encounter (Signed)
Pt last seen 8/3 per MD note pt to continue Gilotrif '40mg'$  daily. Rx sent to [facility]

## 2015-04-24 NOTE — Telephone Encounter (Signed)
Phoned patient's pharmacy reference xanax inquire. Tech reports [facility] has never filled ativan but, has filled xanax 10 mg. Filled last November 2015.

## 2015-04-25 ENCOUNTER — Ambulatory Visit: Payer: BLUE CROSS/BLUE SHIELD

## 2015-04-25 ENCOUNTER — Ambulatory Visit
Admission: RE | Admit: 2015-04-25 | Discharge: 2015-04-25 | Disposition: A | Discharge status: Home / Self Care | Payer: BLUE CROSS/BLUE SHIELD | Source: Ambulatory Visit | Attending: Radiation Oncology | Admitting: Radiation Oncology

## 2015-04-25 DIAGNOSIS — C7931 Secondary malignant neoplasm of brain: Secondary | ICD-10-CM

## 2015-04-25 MED ORDER — GADOBENATE DIMEGLUMINE 529 MG/ML IV SOLN
19.0000 mL | Freq: Once | INTRAVENOUS | Status: AC | PRN
  Administered 2015-04-25: 19 mL via INTRAVENOUS

## 2015-04-28 ENCOUNTER — Ambulatory Visit (HOSPITAL_BASED_OUTPATIENT_CLINIC_OR_DEPARTMENT_OTHER): Payer: BLUE CROSS/BLUE SHIELD

## 2015-04-28 ENCOUNTER — Ambulatory Visit
Admission: RE | Admit: 2015-04-28 | Discharge: 2015-04-28 | Disposition: A | Discharge status: Home / Self Care | Payer: BLUE CROSS/BLUE SHIELD | Source: Ambulatory Visit | Attending: Radiation Oncology | Admitting: Radiation Oncology

## 2015-04-28 VITALS — BP 125/85 | HR 73 | Temp 98.5°F

## 2015-04-28 VITALS — BP 115/84 | HR 85 | Resp 16 | Wt 200.0 lb

## 2015-04-28 DIAGNOSIS — C7949 Secondary malignant neoplasm of other parts of nervous system: Principal | ICD-10-CM

## 2015-04-28 DIAGNOSIS — C3400 Malignant neoplasm of unspecified main bronchus: Secondary | ICD-10-CM

## 2015-04-28 DIAGNOSIS — C7951 Secondary malignant neoplasm of bone: Secondary | ICD-10-CM | POA: Diagnosis not present

## 2015-04-28 DIAGNOSIS — C3411 Malignant neoplasm of upper lobe, right bronchus or lung: Secondary | ICD-10-CM | POA: Diagnosis not present

## 2015-04-28 DIAGNOSIS — C7931 Secondary malignant neoplasm of brain: Secondary | ICD-10-CM

## 2015-04-28 MED ORDER — DENOSUMAB 120 MG/1.7ML ~~LOC~~ SOLN
120.0000 mg | Freq: Once | SUBCUTANEOUS | Status: AC
  Administered 2015-04-28: 120 mg via SUBCUTANEOUS
  Filled 2015-04-28: qty 1.7

## 2015-04-28 NOTE — Progress Notes (Signed)
Weight and vitals stable. Denies pain. Skin irritation from Gilotrif continues. Denies headaches or dizziness. Denies nausea or vomiting. Denies ringing in the ears or diplopia. Reports taking Imodium to manage diarrhea associated with Gilotrif. Reports his energy level is "pretty good."

## 2015-04-28 NOTE — Progress Notes (Signed)
Radiation Oncology         [phone]   Name: Oscar Floyd   Date: 04/28/2015   MRN: [id number]  DOB: 10-Jul-1966    Multidisciplinary Brain [facility] Sp[facility] On[facility] Clinic Follow-Up Visit Note  CC: [name], MD  [name], MD    ICD-9-CM ICD-10-CM   1. Numerous sub-centimeter brain metastases 198.3 C79.31     Diagnosis:   49 year old gentleman with numerous subcentimeter brain metastases from EGFR positive adenocarcinoma the lung status post whole brain irradiation 04/01/2014-04/12/2014 to 30 Gy in 10 fractions of 3 Gy  Interval Since Last Radiation:  1 year 1 month  Narrative:  The patient returns today for routine follow-up.  The recent films were presented in our multidisciplinary conference with neuroradiology just prior to the clinic. Weight and vitals stable. Denies pain. Skin irritation from Gilotrif continues. Denies headaches or dizziness. Denies nausea or vomiting. Denies ringing in the ears or diplopia. Reports taking Imodium to manage diarrhea associated with Gilotrif. Reports his energy level is "pretty good." Notes that his fingernails and toenails get "messed up". Pt went to a podiatrist about the in grown nails on his feet. He plans to see a dermatologist about his fingernails.   ALLERGIES:  has No Known Allergies.  Meds: Current Outpatient Prescriptions  Medication Sig Dispense Refill  . afatinib dimaleate (GILOTRIF) 40 MG tablet Take 1 tablet (40 mg total) by mouth daily. Take on an empty stomach 1hr before or 2 hrs after meals. 30 tablet 1  . ALTABAX 1 % ointment     . calcium-vitamin D (OSCAL) 250-125 MG-UNIT per tablet Take 1 tablet by mouth daily.    Marland Kitchen esomeprazole (NEXIUM) 20 MG capsule Take by mouth 2 (two) times daily before a meal.     . loperamide (IMODIUM) 2 MG capsule Take 2 mg by mouth as needed for diarrhea or loose stools.    . mirtazapine (REMERON) 30 MG tablet Take 1 tablet (30 mg total) by mouth at bedtime. 30 tablet 2  . naproxen sodium  (ANAPROX) 220 MG tablet Take 220 mg by mouth as needed.    . ALPRAZolam (XANAX) 1 MG tablet     . amoxicillin-clavulanate (AUGMENTIN) 875-125 MG per tablet     . diazepam (VALIUM) 10 MG tablet TAKE 1 TABLET BY MOUTH 30 MINUTES PRIOR TO PROCEDURE  0  . neomycin-polymyxin-hydrocortisone (CORTISPORIN) otic solution Apply one to two drops to toe after soaking twice daily. (Patient not taking: Reported on 04/28/2015) 10 mL 0   No current facility-administered medications for this encounter.    Physical Findings: The patient is in no acute distress. Patient is alert and oriented.  weight is 200 lb (90.719 kg). His blood pressure is 115/84 and his pulse is 85. His respiration is 16. .  No significant changes.  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 Restaging. Subsequent encounter.  EXAM: MRI HEAD WITHOUT AND WITH CONTRAST  TECHNIQUE: Multiplanar, multiecho pulse sequences of the brain and surrounding structures were obtained without and with intravenous contrast.  CONTRAST:  57m MULTIHANCE GADOBENATE DIMEGLUMINE 529 MG/ML IV  SOLN  COMPARISON:  01/13/2015 and earlier.  FINDINGS: The numerous small enhancing brain metastases have regressed over this series of exams and are no longer evident on post-contrast imaging. No abnormal hyperenhancement is identified. No intracranial mass effect.  No restricted diffusion to suggest acute infarction. No midline shift, mass effect, evidence of mass lesion, ventriculomegaly, extra-axial collection or acute intracranial hemorrhage. Cervicomedullary junction within normal limits.  Negative visualized cervical spine. Major intracranial vascular flow voids are stable. Pearline Cables and white matter signal has not significantly changed since 2015.  Slightly more conspicuous today but not significantly changed since 2015 there is a 5-6 mm hypo enhancing area in the left pituitary gland (series 11, image 27) suggestive of a benign macro adenoma. The cavernous sinus appears normal.  Bony sclerosis at the clivus is stable compatible with treated osseous metastasis. Mildly heterogeneous bone marrow signal elsewhere with no destructive osseous lesion. Negative scalp soft tissues. Visible internal auditory structures appear normal. Orbits appear normal. Minor paranasal sinus mucosal thickening is stable. Mastoids are clear.  IMPRESSION: 1. Resolution of numerous small brain metastases status post whole brain radiation. No visible metastatic disease to the brain. 2. 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 In the absence of endocrinopathy this is felt inconsequential. 3. Stable treated clivus bone metastasis.   Electronically Signed   By: Genevie Ann M.D.   On: 04/25/2015 10:39   Impression:  The patient is stable with no evidence of progressive or recurrent brain metastases at this time.  Plan:  Repeat MRI and follow up in 3 months.  This document serves as a record of services personally performed by Tyler Pita, MD. It was created on his behalf by Arlyce Harman, a trained medical scribe. The creation of this record is based on the scribe's personal observations and the provider's statements to them. This document has been checked and approved by the attending provider.    _____________________________________  Sheral Apley. Tammi Klippel, M.D.

## 2015-05-21 ENCOUNTER — Other Ambulatory Visit (HOSPITAL_BASED_OUTPATIENT_CLINIC_OR_DEPARTMENT_OTHER): Payer: BLUE CROSS/BLUE SHIELD

## 2015-05-21 DIAGNOSIS — C3411 Malignant neoplasm of upper lobe, right bronchus or lung: Secondary | ICD-10-CM | POA: Diagnosis not present

## 2015-05-21 DIAGNOSIS — K521 Toxic gastroenteritis and colitis: Secondary | ICD-10-CM

## 2015-05-21 DIAGNOSIS — L03019 Cellulitis of unspecified finger: Secondary | ICD-10-CM

## 2015-05-21 DIAGNOSIS — L270 Generalized skin eruption due to drugs and medicaments taken internally: Secondary | ICD-10-CM

## 2015-05-21 DIAGNOSIS — C3491 Malignant neoplasm of unspecified part of right bronchus or lung: Secondary | ICD-10-CM

## 2015-05-21 LAB — CBC WITH DIFFERENTIAL/PLATELET
BASO%: 0.9 % (ref 0.0–2.0)
BASOS ABS: 0 10*3/uL (ref 0.0–0.1)
EOS%: 7 % (ref 0.0–7.0)
Eosinophils Absolute: 0.3 10*3/uL (ref 0.0–0.5)
HCT: 41.7 % (ref 38.4–49.9)
HEMOGLOBIN: 14.3 g/dL (ref 13.0–17.1)
LYMPH%: 26.7 % (ref 14.0–49.0)
MCH: 30.3 pg (ref 27.2–33.4)
MCHC: 34.2 g/dL (ref 32.0–36.0)
MCV: 88.8 fL (ref 79.3–98.0)
MONO#: 0.3 10*3/uL (ref 0.1–0.9)
MONO%: 6.8 % (ref 0.0–14.0)
NEUT#: 2.9 10*3/uL (ref 1.5–6.5)
NEUT%: 58.6 % (ref 39.0–75.0)
Platelets: 200 10*3/uL (ref 140–400)
RBC: 4.7 10*6/uL (ref 4.20–5.82)
RDW: 13.3 % (ref 11.0–14.6)
WBC: 4.9 10*3/uL (ref 4.0–10.3)
lymph#: 1.3 10*3/uL (ref 0.9–3.3)

## 2015-05-21 LAB — COMPREHENSIVE METABOLIC PANEL (CC13)
ALBUMIN: 3.6 g/dL (ref 3.5–5.0)
ALK PHOS: 96 U/L (ref 40–150)
ALT: 31 U/L (ref 0–55)
AST: 30 U/L (ref 5–34)
Anion Gap: 6 mEq/L (ref 3–11)
BUN: 8.4 mg/dL (ref 7.0–26.0)
CALCIUM: 8.8 mg/dL (ref 8.4–10.4)
CO2: 25 mEq/L (ref 22–29)
Chloride: 110 mEq/L — ABNORMAL HIGH (ref 98–109)
Creatinine: 1 mg/dL (ref 0.7–1.3)
EGFR: 89 mL/min/{1.73_m2} — ABNORMAL LOW (ref >90)
GLUCOSE: 94 mg/dL (ref 70–140)
Potassium: 4.4 mEq/L (ref 3.5–5.1)
SODIUM: 141 meq/L (ref 136–145)
TOTAL PROTEIN: 6.4 g/dL (ref 6.4–8.3)
Total Bilirubin: 0.77 mg/dL (ref 0.20–1.20)

## 2015-05-22 ENCOUNTER — Ambulatory Visit (HOSPITAL_BASED_OUTPATIENT_CLINIC_OR_DEPARTMENT_OTHER): Payer: BLUE CROSS/BLUE SHIELD

## 2015-05-22 VITALS — BP 123/76 | HR 73 | Temp 98.6°F

## 2015-05-22 DIAGNOSIS — C7951 Secondary malignant neoplasm of bone: Secondary | ICD-10-CM | POA: Diagnosis not present

## 2015-05-22 DIAGNOSIS — C3411 Malignant neoplasm of upper lobe, right bronchus or lung: Secondary | ICD-10-CM

## 2015-05-22 DIAGNOSIS — C3400 Malignant neoplasm of unspecified main bronchus: Secondary | ICD-10-CM

## 2015-05-22 MED ORDER — DENOSUMAB 120 MG/1.7ML ~~LOC~~ SOLN
120.0000 mg | Freq: Once | SUBCUTANEOUS | Status: AC
  Administered 2015-05-22: 120 mg via SUBCUTANEOUS
  Filled 2015-05-22: qty 1.7

## 2015-05-23 ENCOUNTER — Ambulatory Visit (HOSPITAL_COMMUNITY)
Admission: RE | Admit: 2015-05-23 | Discharge: 2015-05-23 | Disposition: A | Discharge status: Home / Self Care | Payer: BLUE CROSS/BLUE SHIELD | Source: Ambulatory Visit | Attending: Internal Medicine | Admitting: Internal Medicine

## 2015-05-23 DIAGNOSIS — K521 Toxic gastroenteritis and colitis: Secondary | ICD-10-CM | POA: Insufficient documentation

## 2015-05-23 DIAGNOSIS — C7951 Secondary malignant neoplasm of bone: Secondary | ICD-10-CM | POA: Diagnosis not present

## 2015-05-23 DIAGNOSIS — L03019 Cellulitis of unspecified finger: Secondary | ICD-10-CM | POA: Diagnosis not present

## 2015-05-23 DIAGNOSIS — C3491 Malignant neoplasm of unspecified part of right bronchus or lung: Secondary | ICD-10-CM | POA: Diagnosis not present

## 2015-05-23 DIAGNOSIS — L270 Generalized skin eruption due to drugs and medicaments taken internally: Secondary | ICD-10-CM | POA: Insufficient documentation

## 2015-05-23 DIAGNOSIS — R911 Solitary pulmonary nodule: Secondary | ICD-10-CM | POA: Diagnosis not present

## 2015-05-23 MED ORDER — IOHEXOL 300 MG/ML  SOLN
100.0000 mL | Freq: Once | INTRAMUSCULAR | Status: AC | PRN
  Administered 2015-05-23: 100 mL via INTRAVENOUS

## 2015-05-23 MED ORDER — IOHEXOL 300 MG/ML  SOLN
50.0000 mL | Freq: Once | INTRAMUSCULAR | Status: AC | PRN
  Administered 2015-05-23: 50 mL via ORAL

## 2015-05-28 ENCOUNTER — Telehealth: Payer: Self-pay | Admitting: Internal Medicine

## 2015-05-28 ENCOUNTER — Encounter: Payer: Self-pay | Admitting: Internal Medicine

## 2015-05-28 ENCOUNTER — Ambulatory Visit (HOSPITAL_BASED_OUTPATIENT_CLINIC_OR_DEPARTMENT_OTHER): Payer: BLUE CROSS/BLUE SHIELD | Admitting: Internal Medicine

## 2015-05-28 VITALS — BP 132/89 | HR 82 | Temp 98.8°F | Resp 18 | Ht 74.0 in | Wt 202.1 lb

## 2015-05-28 DIAGNOSIS — L03019 Cellulitis of unspecified finger: Secondary | ICD-10-CM | POA: Diagnosis not present

## 2015-05-28 DIAGNOSIS — C7931 Secondary malignant neoplasm of brain: Secondary | ICD-10-CM

## 2015-05-28 DIAGNOSIS — C7949 Secondary malignant neoplasm of other parts of nervous system: Secondary | ICD-10-CM

## 2015-05-28 DIAGNOSIS — C3491 Malignant neoplasm of unspecified part of right bronchus or lung: Secondary | ICD-10-CM | POA: Diagnosis not present

## 2015-05-28 NOTE — Telephone Encounter (Signed)
Gave adn printed appt sched and avs for pt for OCT thru [facility]

## 2015-05-28 NOTE — Progress Notes (Signed)
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  Primary site: Lung (Right)  Staging method: AJCC 7th Edition  Clinical: Stage IV (T2a, N0, M1b) signed by [name], MD on 03/26/2014 4:57 PM  Summary: Stage IV (T2a, N0, M1b)   PRIOR THERAPY: Status post whole brain irradiation under the care Dr. Tammi Klippel completed 04/12/2014   CURRENT THERAPY:  1) Gilotrif 40 mg po daily - therapy beginning 04/03/2014. Status post approximately 13 months of therapy  2) Xgeva 120 mcg subcutaneously on monthly basis.   DISEASE STAGE:  Lung cancer  Primary site: Lung (Right)  Staging method: AJCC 7th Edition  Clinical: Stage IV (T2a, N0, M1b) signed by [name], MD on 03/26/2014 4:57 PM  Summary: Stage IV (T2a, N0, M1b)  CHEMOTHERAPY INTENT: palliative  CURRENT # OF CHEMOTHERAPY CYCLES: 14  CURRENT ANTIEMETICS: none  CURRENT SMOKING STATUS: non-smoker/never smoker  ORAL CHEMOTHERAPY AND CONSENT: yes- Gilotrif  CURRENT BISPHOSPHONATES USE: none  PAIN MANAGEMENT: none  NARCOTICS INDUCED CONSTIPATION: none  LIVING WILL AND CODE STATUS:   INTERVAL HISTORY: Oscar Floyd 49 y.o. male returns to the clinic today for followup visit. The patient is tolerating his treatment with Gilotrif fairly well with no significant adverse effects except for the persistent mild skin rash on the nose, paronychia and occasional episodes of diarrhea. He was seen by a dermatologist recently and started on Altabax 1% ointment and it did help improving his paronychia  significantly. The patient denied having any significant chest pain, shortness of breath, cough or hemoptysis. He has no fever or chills, no nausea or vomiting. The patient has repeat CT scan of the chest, abdomen and pelvis and he is here today for evaluation and discussion of his scan results.  MEDICAL HISTORY: Past Medical History  Diagnosis Date  . Lung cancer     RUL lung with mets to liver, bone and brain  . Hypertension     ALLERGIES:  has No Known Allergies.  MEDICATIONS:  Current Outpatient Prescriptions  Medication Sig Dispense Refill  . afatinib dimaleate (GILOTRIF) 40 MG tablet Take 1 tablet (40 mg total) by mouth daily. Take on an empty stomach 1hr before or 2 hrs after meals. 30 tablet 1  . ALPRAZolam (XANAX) 1 MG tablet     . ALTABAX 1 % ointment     . amoxicillin-clavulanate (AUGMENTIN) 875-125 MG per tablet     . calcium-vitamin D (OSCAL) 250-125 MG-UNIT per tablet Take 1 tablet by mouth daily.    . diazepam (VALIUM) 10 MG tablet TAKE 1 TABLET BY MOUTH 30 MINUTES PRIOR TO PROCEDURE  0  . esomeprazole (NEXIUM) 20 MG capsule Take by mouth 2 (two) times daily before a meal.     . loperamide (IMODIUM) 2 MG capsule Take 2 mg by mouth as needed for diarrhea or loose stools.    . mirtazapine (REMERON) 30 MG tablet Take 1 tablet (30 mg total) by mouth at bedtime. 30 tablet 2  . naproxen sodium (ANAPROX) 220 MG tablet Take 220 mg by mouth as needed.    . neomycin-polymyxin-hydrocortisone (  CORTISPORIN) otic solution Apply one to two drops to toe after soaking twice daily. (Patient not taking: Reported on 04/28/2015) 10 mL 0   No current facility-administered medications for this visit.    SURGICAL HISTORY:  Past Surgical History  Procedure Laterality Date  . Vasectomy    . Video bronchoscopy Bilateral 03/20/2014    Procedure: VIDEO BRONCHOSCOPY WITH FLUORO;  Surgeon: Rigoberto Noel, MD;  Location: [facility];  Service: Cardiopulmonary;  Laterality: Bilateral;    REVIEW OF  SYSTEMS:  Constitutional: negative Eyes: negative Ears, nose, mouth, throat, and face: negative Respiratory: negative Cardiovascular: negative Gastrointestinal: negative Genitourinary:negative Integument/breast: positive for dryness and rash Hematologic/lymphatic: negative Musculoskeletal:negative Neurological: negative Behavioral/Psych: negative Endocrine: negative Allergic/Immunologic: negative   PHYSICAL EXAMINATION: General appearance: alert, cooperative and no distress Head: Normocephalic, without obvious abnormality, atraumatic Neck: no adenopathy, no JVD, supple, symmetrical, trachea midline and thyroid not enlarged, symmetric, no tenderness/mass/nodules Lymph nodes: Cervical, supraclavicular, and axillary nodes normal. Resp: clear to auscultation bilaterally Back: symmetric, no curvature. ROM normal. No CVA tenderness. Cardio: regular rate and rhythm, S1, S2 normal, no murmur, click, rub or gallop GI: soft, non-tender; bowel sounds normal; no masses,  no organomegaly Extremities: extremities normal, atraumatic, no cyanosis or edema Neurologic: Alert and oriented X 3, normal strength and tone. Normal symmetric reflexes. Normal coordination and gait  ECOG PERFORMANCE STATUS: 1 - Symptomatic but completely ambulatory  Blood pressure 132/89, pulse 82, temperature 98.8 F (37.1 C), temperature source Oral, resp. rate 18, height 6' 2"  (1.88 m), weight 202 lb 1.6 oz (91.672 kg), SpO2 100 %.  LABORATORY DATA: Lab Results  Component Value Date   WBC 4.9 05/21/2015   HGB 14.3 05/21/2015   HCT 41.7 05/21/2015   MCV 88.8 05/21/2015   PLT 200 05/21/2015      Chemistry      Component Value Date/Time   NA 141 05/21/2015 0850   K 4.4 05/21/2015 0850   CO2 25 05/21/2015 0850   BUN 8.4 05/21/2015 0850   CREATININE 1.0 05/21/2015 0850      Component Value Date/Time   CALCIUM 8.8 05/21/2015 0850   ALKPHOS 96 05/21/2015 0850   AST 30 05/21/2015 0850   ALT 31 05/21/2015 0850     BILITOT 0.77 05/21/2015 0850       RADIOGRAPHIC STUDIES: Ct Chest W Contrast  05/23/2015   CLINICAL DATA:  Metastatic lung cancer, to liver, brain, and the skeleton. Oral chemotherapy ongoing. Radiation therapy completed in August 2015.  EXAM: CT CHEST, ABDOMEN, AND PELVIS WITH CONTRAST  TECHNIQUE: Multidetector CT imaging of the chest, abdomen and pelvis was performed following the standard protocol during bolus administration of intravenous contrast.  CONTRAST:  100 cc Omnipaque 300  COMPARISON:  03/03/2015  FINDINGS: CT CHEST FINDINGS  Mediastinum/Nodes: Unremarkable  Lungs/Pleura: 1.6 by 1.1 cm right upper lobe pulmonary nodule, image 31 series 5, previously the same by my measurements. This sits just above the minor fissure with some adjacent volume loss. Scattered fine nodularity in both lungs is stable.  Musculoskeletal: Stable diffuse sclerosis compatible with treated osseous metastatic disease, no thoracic progression.  CT ABDOMEN PELVIS FINDINGS  Hepatobiliary: Unremarkable  Pancreas: Unremarkable  Spleen: Unremarkable  Adrenals/Urinary Tract: Unremarkable  Stomach/Bowel: Mild proximal sigmoid colon diverticulosis. Several upper normal sized loops of left upper quadrant jejunum demonstrate scattered air-fluid levels. No bowel wall thickening observed.  Vascular/Lymphatic: Unremarkable  Reproductive: Unremarkable  Other: No supplemental non-categorized findings.  Musculoskeletal: Stable diffuse sclerosis in the skeleton compatible with treated osseous metastatic  disease.  IMPRESSION: 1. Stable 1.6 by 1.1 cm right upper lobe pulmonary nodule with scattered fine nodularity throughout the lungs. 2. Stable diffuse treated osseous metastatic disease. 3. Several air- fluid levels in borderline dilated loops of jejunum, possibly reflecting a low-grade proximal enteritis. No overt bowel wall thickening.   Electronically Signed   By: Van Clines M.D.   On: 05/23/2015 14:44   Ct Abdomen Pelvis W  Contrast  05/23/2015   CLINICAL DATA:  Metastatic lung cancer, to liver, brain, and the skeleton. Oral chemotherapy ongoing. Radiation therapy completed in August 2015.  EXAM: CT CHEST, ABDOMEN, AND PELVIS WITH CONTRAST  TECHNIQUE: Multidetector CT imaging of the chest, abdomen and pelvis was performed following the standard protocol during bolus administration of intravenous contrast.  CONTRAST:  100 cc Omnipaque 300  COMPARISON:  03/03/2015  FINDINGS: CT CHEST FINDINGS  Mediastinum/Nodes: Unremarkable  Lungs/Pleura: 1.6 by 1.1 cm right upper lobe pulmonary nodule, image 31 series 5, previously the same by my measurements. This sits just above the minor fissure with some adjacent volume loss. Scattered fine nodularity in both lungs is stable.  Musculoskeletal: Stable diffuse sclerosis compatible with treated osseous metastatic disease, no thoracic progression.  CT ABDOMEN PELVIS FINDINGS  Hepatobiliary: Unremarkable  Pancreas: Unremarkable  Spleen: Unremarkable  Adrenals/Urinary Tract: Unremarkable  Stomach/Bowel: Mild proximal sigmoid colon diverticulosis. Several upper normal sized loops of left upper quadrant jejunum demonstrate scattered air-fluid levels. No bowel wall thickening observed.  Vascular/Lymphatic: Unremarkable  Reproductive: Unremarkable  Other: No supplemental non-categorized findings.  Musculoskeletal: Stable diffuse sclerosis in the skeleton compatible with treated osseous metastatic disease.  IMPRESSION: 1. Stable 1.6 by 1.1 cm right upper lobe pulmonary nodule with scattered fine nodularity throughout the lungs. 2. Stable diffuse treated osseous metastatic disease. 3. Several air- fluid levels in borderline dilated loops of jejunum, possibly reflecting a low-grade proximal enteritis. No overt bowel wall thickening.   Electronically Signed   By: Van Clines M.D.   On: 05/23/2015 14:44   ASSESSMENT AND PLAN: This is a very pleasant 49 years old white male with stage IV non-small cell  lung cancer, adenocarcinoma with positive EGFR mutation in exon 19 currently undergoing treatment with oral Gilotrif 40 mg by mouth daily status post 13 months and tolerating his treatment fairly well except for the mild skin rash and paronychia.  His recent CT scan of the chest, abdomen and pelvis showed stable disease with no evidence of disease progression. I discussed the scan results with the patient today. I recommended for him to continue his current treatment with Gilotrif at the same dose. For the metastatic bone disease, the patient will continue with monthly Xgeva. He will also continue on calcium and vitamin D supplement. For the weight loss and depression, he is currently on Remeron 30 mg by mouth each bedtime and feels much better. He would come back for followup visit in 6 weeks with repeat CBC and comprehensive metabolic panel. He was advised to call immediately if he has any concerning symptoms in the interval. The patient voices understanding of current disease status and treatment options and is in agreement with the current care plan.  All questions were answered. The patient knows to call the clinic with any problems, questions or concerns. We can certainly see the patient much sooner if necessary.  Disclaimer: This note was dictated with voice recognition software. Similar sounding words can inadvertently be transcribed and may not be corrected upon review.

## 2015-06-04 ENCOUNTER — Other Ambulatory Visit: Payer: Self-pay | Admitting: Internal Medicine

## 2015-06-20 ENCOUNTER — Ambulatory Visit (HOSPITAL_BASED_OUTPATIENT_CLINIC_OR_DEPARTMENT_OTHER): Payer: BLUE CROSS/BLUE SHIELD

## 2015-06-20 VITALS — BP 138/74 | HR 77 | Temp 97.7°F | Resp 20

## 2015-06-20 DIAGNOSIS — C3411 Malignant neoplasm of upper lobe, right bronchus or lung: Secondary | ICD-10-CM | POA: Diagnosis not present

## 2015-06-20 DIAGNOSIS — C7951 Secondary malignant neoplasm of bone: Secondary | ICD-10-CM | POA: Diagnosis not present

## 2015-06-20 DIAGNOSIS — C3400 Malignant neoplasm of unspecified main bronchus: Secondary | ICD-10-CM

## 2015-06-20 MED ORDER — DENOSUMAB 120 MG/1.7ML ~~LOC~~ SOLN
120.0000 mg | Freq: Once | SUBCUTANEOUS | Status: AC
  Administered 2015-06-20: 120 mg via SUBCUTANEOUS
  Filled 2015-06-20: qty 1.7

## 2015-06-20 NOTE — Patient Instructions (Signed)
Denosumab injection What is this medicine? DENOSUMAB (den oh sue mab) slows bone breakdown. Prolia is used to treat osteoporosis in women after menopause and in men. Xgeva is used to prevent bone fractures and other bone problems caused by cancer bone metastases. Xgeva is also used to treat giant cell tumor of the bone. This medicine may be used for other purposes; ask your health care provider or pharmacist if you have questions. COMMON BRAND NAME(S): Prolia, XGEVA What should I tell my health care provider before I take this medicine? They need to know if you have any of these conditions: -dental disease -eczema -infection or history of infections -kidney disease or on dialysis -low blood calcium or vitamin D -malabsorption syndrome -scheduled to have surgery or tooth extraction -taking medicine that contains denosumab -thyroid or parathyroid disease -an unusual reaction to denosumab, other medicines, foods, dyes, or preservatives -pregnant or trying to get pregnant -breast-feeding How should I use this medicine? This medicine is for injection under the skin. It is given by a health care professional in a hospital or clinic setting. If you are getting Prolia, a special MedGuide will be given to you by the pharmacist with each prescription and refill. Be sure to read this information carefully each time. For Prolia, talk to your pediatrician regarding the use of this medicine in children. Special care may be needed. For Xgeva, talk to your pediatrician regarding the use of this medicine in children. While this drug may be prescribed for children as young as 13 years for selected conditions, precautions do apply. Overdosage: If you think you've taken too much of this medicine contact a poison control center or emergency room at once. Overdosage: If you think you have taken too much of this medicine contact a poison control center or emergency room at once. NOTE: This medicine is only for  you. Do not share this medicine with others. What if I miss a dose? It is important not to miss your dose. Call your doctor or health care professional if you are unable to keep an appointment. What may interact with this medicine? Do not take this medicine with any of the following medications: -other medicines containing denosumab This medicine may also interact with the following medications: -medicines that suppress the immune system -medicines that treat cancer -steroid medicines like prednisone or cortisone This list may not describe all possible interactions. Give your health care provider a list of all the medicines, herbs, non-prescription drugs, or dietary supplements you use. Also tell them if you smoke, drink alcohol, or use illegal drugs. Some items may interact with your medicine. What should I watch for while using this medicine? Visit your doctor or health care professional for regular checks on your progress. Your doctor or health care professional may order blood tests and other tests to see how you are doing. Call your doctor or health care professional if you get a cold or other infection while receiving this medicine. Do not treat yourself. This medicine may decrease your body's ability to fight infection. You should make sure you get enough calcium and vitamin D while you are taking this medicine, unless your doctor tells you not to. Discuss the foods you eat and the vitamins you take with your health care professional. See your dentist regularly. Brush and floss your teeth as directed. Before you have any dental work done, tell your dentist you are receiving this medicine. Do not become pregnant while taking this medicine or for 5 months after stopping   it. Women should inform their doctor if they wish to become pregnant or think they might be pregnant. There is a potential for serious side effects to an unborn child. Talk to your health care professional or pharmacist for more  information. What side effects may I notice from receiving this medicine? Side effects that you should report to your doctor or health care professional as soon as possible: -allergic reactions like skin rash, itching or hives, swelling of the face, lips, or tongue -breathing problems -chest pain -fast, irregular heartbeat -feeling faint or lightheaded, falls -fever, chills, or any other sign of infection -muscle spasms, tightening, or twitches -numbness or tingling -skin blisters or bumps, or is dry, peels, or red -slow healing or unexplained pain in the mouth or jaw -unusual bleeding or bruising Side effects that usually do not require medical attention (Report these to your doctor or health care professional if they continue or are bothersome.): -muscle pain -stomach upset, gas This list may not describe all possible side effects. Call your doctor for medical advice about side effects. You may report side effects to FDA at 1-800-FDA-1088. Where should I keep my medicine? This medicine is only given in a clinic, doctor's office, or other health care setting and will not be stored at home. NOTE: This sheet is a summary. It may not cover all possible information. If you have questions about this medicine, talk to your doctor, pharmacist, or health care provider.  2015, Elsevier/Gold Standard. (2012-02-28 12:37:47)  

## 2015-06-30 ENCOUNTER — Other Ambulatory Visit: Payer: Self-pay | Admitting: Medical Oncology

## 2015-06-30 DIAGNOSIS — C3490 Malignant neoplasm of unspecified part of unspecified bronchus or lung: Secondary | ICD-10-CM

## 2015-06-30 MED ORDER — AFATINIB DIMALEATE 40 MG PO TABS: 40.0000 mg | ORAL_TABLET | Freq: Every day | ORAL | Status: DC

## 2015-07-08 ENCOUNTER — Encounter: Payer: Self-pay | Admitting: *Deleted

## 2015-07-08 ENCOUNTER — Telehealth: Payer: Self-pay | Admitting: Internal Medicine

## 2015-07-08 ENCOUNTER — Other Ambulatory Visit (HOSPITAL_BASED_OUTPATIENT_CLINIC_OR_DEPARTMENT_OTHER): Payer: BLUE CROSS/BLUE SHIELD

## 2015-07-08 ENCOUNTER — Ambulatory Visit (HOSPITAL_BASED_OUTPATIENT_CLINIC_OR_DEPARTMENT_OTHER): Payer: BLUE CROSS/BLUE SHIELD | Admitting: Internal Medicine

## 2015-07-08 ENCOUNTER — Encounter: Payer: Self-pay | Admitting: Internal Medicine

## 2015-07-08 VITALS — BP 115/85 | HR 80 | Temp 98.2°F | Resp 18 | Ht 74.0 in | Wt 202.6 lb

## 2015-07-08 DIAGNOSIS — C3491 Malignant neoplasm of unspecified part of right bronchus or lung: Secondary | ICD-10-CM

## 2015-07-08 DIAGNOSIS — C787 Secondary malignant neoplasm of liver and intrahepatic bile duct: Secondary | ICD-10-CM | POA: Diagnosis not present

## 2015-07-08 DIAGNOSIS — C3411 Malignant neoplasm of upper lobe, right bronchus or lung: Secondary | ICD-10-CM

## 2015-07-08 DIAGNOSIS — L03019 Cellulitis of unspecified finger: Secondary | ICD-10-CM

## 2015-07-08 DIAGNOSIS — C7931 Secondary malignant neoplasm of brain: Secondary | ICD-10-CM

## 2015-07-08 DIAGNOSIS — C7949 Secondary malignant neoplasm of other parts of nervous system: Secondary | ICD-10-CM

## 2015-07-08 DIAGNOSIS — R634 Abnormal weight loss: Secondary | ICD-10-CM

## 2015-07-08 DIAGNOSIS — F329 Major depressive disorder, single episode, unspecified: Secondary | ICD-10-CM

## 2015-07-08 DIAGNOSIS — C7951 Secondary malignant neoplasm of bone: Secondary | ICD-10-CM | POA: Diagnosis not present

## 2015-07-08 DIAGNOSIS — R21 Rash and other nonspecific skin eruption: Secondary | ICD-10-CM

## 2015-07-08 LAB — COMPREHENSIVE METABOLIC PANEL (CC13)
ALK PHOS: 99 U/L (ref 40–150)
ALT: 27 U/L (ref 0–55)
AST: 28 U/L (ref 5–34)
Albumin: 3.8 g/dL (ref 3.5–5.0)
Anion Gap: 6 mEq/L (ref 3–11)
BILIRUBIN TOTAL: 0.63 mg/dL (ref 0.20–1.20)
BUN: 7.9 mg/dL (ref 7.0–26.0)
CO2: 27 meq/L (ref 22–29)
CREATININE: 1.1 mg/dL (ref 0.7–1.3)
Calcium: 9.2 mg/dL (ref 8.4–10.4)
Chloride: 109 mEq/L (ref 98–109)
EGFR: 83 mL/min/{1.73_m2} — ABNORMAL LOW (ref >90)
GLUCOSE: 99 mg/dL (ref 70–140)
Potassium: 4 mEq/L (ref 3.5–5.1)
SODIUM: 142 meq/L (ref 136–145)
TOTAL PROTEIN: 6.9 g/dL (ref 6.4–8.3)

## 2015-07-08 LAB — CBC WITH DIFFERENTIAL/PLATELET
BASO%: 0.6 % (ref 0.0–2.0)
Basophils Absolute: 0 10*3/uL (ref 0.0–0.1)
EOS ABS: 0.5 10*3/uL (ref 0.0–0.5)
EOS%: 7.4 % — ABNORMAL HIGH (ref 0.0–7.0)
HCT: 43.2 % (ref 38.4–49.9)
HGB: 14.6 g/dL (ref 13.0–17.1)
LYMPH%: 22 % (ref 14.0–49.0)
MCH: 30.2 pg (ref 27.2–33.4)
MCHC: 33.8 g/dL (ref 32.0–36.0)
MCV: 89.3 fL (ref 79.3–98.0)
MONO#: 0.4 10*3/uL (ref 0.1–0.9)
MONO%: 6 % (ref 0.0–14.0)
NEUT%: 64 % (ref 39.0–75.0)
NEUTROS ABS: 4 10*3/uL (ref 1.5–6.5)
PLATELETS: 219 10*3/uL (ref 140–400)
RBC: 4.84 10*6/uL (ref 4.20–5.82)
RDW: 13.5 % (ref 11.0–14.6)
WBC: 6.2 10*3/uL (ref 4.0–10.3)
lymph#: 1.4 10*3/uL (ref 0.9–3.3)

## 2015-07-08 NOTE — Progress Notes (Signed)
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  Primary site: Lung (Right)  Staging method: AJCC 7th Edition  Clinical: Stage IV (T2a, N0, M1b) signed by [name], MD on 03/26/2014 4:57 PM  Summary: Stage IV (T2a, N0, M1b)   PRIOR THERAPY: Status post whole brain irradiation under the care Dr. Tammi Klippel completed 04/12/2014   CURRENT THERAPY:  1) Gilotrif 40 mg po daily - therapy beginning 04/03/2014. Status post approximately 14 months of therapy  2) Xgeva 120 mcg subcutaneously on monthly basis.   DISEASE STAGE:  Lung cancer  Primary site: Lung (Right)  Staging method: AJCC 7th Edition  Clinical: Stage IV (T2a, N0, M1b) signed by [name], MD on 03/26/2014 4:57 PM  Summary: Stage IV (T2a, N0, M1b)  CHEMOTHERAPY INTENT: palliative  CURRENT # OF CHEMOTHERAPY CYCLES: 15 CURRENT ANTIEMETICS: none  CURRENT SMOKING STATUS: non-smoker/never smoker  ORAL CHEMOTHERAPY AND CONSENT: yes- Gilotrif  CURRENT BISPHOSPHONATES USE: none  PAIN MANAGEMENT: none  NARCOTICS INDUCED CONSTIPATION: none  LIVING WILL AND CODE STATUS:   INTERVAL HISTORY: Oscar Floyd 49 y.o. male returns to the clinic today for monthly followup visit. The patient is tolerating his treatment with Gilotrif fairly well with no significant adverse effects except for the persistent mild skin rash on the nose, paronychia and occasional episodes of diarrhea. He is very active and plays golf at regular basis. The patient denied having any significant chest pain, shortness of  breath, cough or hemoptysis. He has no fever or chills, no nausea or vomiting. He has repeat blood work and he is here today for evaluation and discussion of his lab results.  MEDICAL HISTORY: Past Medical History  Diagnosis Date  . Lung cancer ([place])     RUL lung with mets to liver, bone and brain  . Hypertension     ALLERGIES:  has No Known Allergies.  MEDICATIONS:  Current Outpatient Prescriptions  Medication Sig Dispense Refill  . afatinib dimaleate (GILOTRIF) 40 MG tablet Take 1 tablet (40 mg total) by mouth daily. Take on an empty stomach 1hr before or 2 hrs after meals. 30 tablet 1  . ALPRAZolam (XANAX) 1 MG tablet     . ALTABAX 1 % ointment     . amoxicillin-clavulanate (AUGMENTIN) 875-125 MG per tablet     . calcium-vitamin D (OSCAL) 250-125 MG-UNIT per tablet Take 1 tablet by mouth daily.    . diazepam (VALIUM) 10 MG tablet TAKE 1 TABLET BY MOUTH 30 MINUTES PRIOR TO PROCEDURE  0  . esomeprazole (NEXIUM) 20 MG capsule Take by mouth 2 (two) times daily before a meal.     . loperamide (IMODIUM) 2 MG capsule Take 2 mg by mouth as needed for diarrhea or loose stools.    . mirtazapine (REMERON) 30 MG tablet Take 1 tablet (30 mg total) by mouth at bedtime. 30 tablet 2  . mirtazapine (REMERON) 30 MG tablet TAKE 1 TABLET (30 MG TOTAL) BY MOUTH AT BEDTIME. 30 tablet 2  . naproxen sodium (ANAPROX) 220 MG tablet Take 220 mg by mouth as needed.    Marland Kitchen  neomycin-polymyxin-hydrocortisone (CORTISPORIN) otic solution Apply one to two drops to toe after soaking twice daily. 10 mL 0   No current facility-administered medications for this visit.    SURGICAL HISTORY:  Past Surgical History  Procedure Laterality Date  . Vasectomy    . Video bronchoscopy Bilateral 03/20/2014    Procedure: VIDEO BRONCHOSCOPY WITH FLUORO;  Surgeon: Rigoberto Noel, MD;  Location: [facility];  Service: Cardiopulmonary;  Laterality: Bilateral;    REVIEW OF SYSTEMS:  Constitutional: negative Eyes: negative Ears,  nose, mouth, throat, and face: negative Respiratory: negative Cardiovascular: negative Gastrointestinal: negative Genitourinary:negative Integument/breast: positive for dryness and rash Hematologic/lymphatic: negative Musculoskeletal:negative Neurological: negative Behavioral/Psych: negative Endocrine: negative Allergic/Immunologic: negative   PHYSICAL EXAMINATION: General appearance: alert, cooperative and no distress Head: Normocephalic, without obvious abnormality, atraumatic Neck: no adenopathy, no JVD, supple, symmetrical, trachea midline and thyroid not enlarged, symmetric, no tenderness/mass/nodules Lymph nodes: Cervical, supraclavicular, and axillary nodes normal. Resp: clear to auscultation bilaterally Back: symmetric, no curvature. ROM normal. No CVA tenderness. Cardio: regular rate and rhythm, S1, S2 normal, no murmur, click, rub or gallop GI: soft, non-tender; bowel sounds normal; no masses,  no organomegaly Extremities: extremities normal, atraumatic, no cyanosis or edema Neurologic: Alert and oriented X 3, normal strength and tone. Normal symmetric reflexes. Normal coordination and gait  ECOG PERFORMANCE STATUS: 1 - Symptomatic but completely ambulatory  Blood pressure 115/85, pulse 80, temperature 98.2 F (36.8 C), temperature source Oral, resp. rate 18, height _0  (1.88 m), weight 202 lb 9.6 oz (91.899 kg), SpO2 100 %.  LABORATORY DATA: Lab Results  Component Value Date   WBC 6.2 07/08/2015   HGB 14.6 07/08/2015   HCT 43.2 07/08/2015   MCV 89.3 07/08/2015   PLT 219 07/08/2015      Chemistry      Component Value Date/Time   NA 142 07/08/2015 0849   K 4.0 07/08/2015 0849   CO2 27 07/08/2015 0849   BUN 7.9 07/08/2015 0849   CREATININE 1.1 07/08/2015 0849      Component Value Date/Time   CALCIUM 9.2 07/08/2015 0849   ALKPHOS 99 07/08/2015 0849   AST 28 07/08/2015 0849   ALT 27 07/08/2015 0849   BILITOT 0.63 07/08/2015 0849       RADIOGRAPHIC  STUDIES: No results found. ASSESSMENT AND PLAN: This is a very pleasant 49 years old white male with [name], adenocarcinoma with positive EGFR mutation in exon 19 currently undergoing treatment with oral Gilotrif 40 mg by mouth daily status post 13 months and tolerating his treatment fairly well except for the mild skin rash and paronychia.  His CBC and compliance metabolic panel are unremarkable today. I recommended for him to continue his current treatment with Gilotrif at the same dose. For the metastatic bone disease, the patient will continue with monthly Xgeva. He will also continue on calcium and vitamin D supplement. For the weight loss and depression, he is currently on Remeron 30 mg by mouth each bedtime. He would come back for followup visit in 6 weeks with repeat CBC and comprehensive metabolic panel as well as CT scan of the chest, abdomen and pelvis. He was advised to call immediately if he has any concerning symptoms in the interval. The patient voices understanding of current disease status and treatment options and is in agreement with the current care plan.  All questions were answered. The patient knows to call the clinic with any problems, questions or concerns. We can certainly see the  patient much sooner if necessary.  Disclaimer: This note was dictated with voice recognition software. Similar sounding words can inadvertently be transcribed and may not be corrected upon review.

## 2015-07-08 NOTE — Telephone Encounter (Signed)
per pof to sch pt appt-gave pt copy of avs °

## 2015-07-08 NOTE — Progress Notes (Signed)
Oncology Nurse Navigator Documentation  Oncology Nurse Navigator Flowsheets 07/08/2015  Navigator Encounter Type Clinic/MDC  Patient Visit Type Medonc  Treatment Phase Treatment  Barriers/Navigation Needs No barriers at this time  Interventions Other/spoke with patient today at [facility].  He is doing well on treatment.  Emotional support and encouragement given   Time Spent with Patient 15

## 2015-07-08 NOTE — Telephone Encounter (Signed)
per p[facility] to sch pt appt-gave pt copy of avs-[facility] would call [facility] to sch scan

## 2015-07-18 ENCOUNTER — Ambulatory Visit (HOSPITAL_BASED_OUTPATIENT_CLINIC_OR_DEPARTMENT_OTHER): Payer: BLUE CROSS/BLUE SHIELD

## 2015-07-18 ENCOUNTER — Telehealth: Payer: Self-pay | Admitting: *Deleted

## 2015-07-18 VITALS — BP 129/86 | HR 71 | Temp 98.6°F

## 2015-07-18 DIAGNOSIS — C3400 Malignant neoplasm of unspecified main bronchus: Secondary | ICD-10-CM | POA: Diagnosis not present

## 2015-07-18 DIAGNOSIS — C7951 Secondary malignant neoplasm of bone: Secondary | ICD-10-CM | POA: Diagnosis not present

## 2015-07-18 MED ORDER — DENOSUMAB 120 MG/1.7ML ~~LOC~~ SOLN
120.0000 mg | Freq: Once | SUBCUTANEOUS | Status: AC
  Administered 2015-07-18: 120 mg via SUBCUTANEOUS
  Filled 2015-07-18: qty 1.7

## 2015-07-18 NOTE — Telephone Encounter (Signed)
Called patient at home about injection appointment.  States that he forgot.  Will come now.

## 2015-08-08 ENCOUNTER — Other Ambulatory Visit: Payer: Self-pay | Admitting: Medical Oncology

## 2015-08-08 DIAGNOSIS — C3491 Malignant neoplasm of unspecified part of right bronchus or lung: Secondary | ICD-10-CM

## 2015-08-11 ENCOUNTER — Encounter: Payer: Self-pay | Admitting: Internal Medicine

## 2015-08-11 ENCOUNTER — Ambulatory Visit (HOSPITAL_BASED_OUTPATIENT_CLINIC_OR_DEPARTMENT_OTHER): Payer: BLUE CROSS/BLUE SHIELD | Admitting: Internal Medicine

## 2015-08-11 ENCOUNTER — Other Ambulatory Visit (HOSPITAL_BASED_OUTPATIENT_CLINIC_OR_DEPARTMENT_OTHER): Payer: BLUE CROSS/BLUE SHIELD

## 2015-08-11 ENCOUNTER — Telehealth: Payer: Self-pay | Admitting: Internal Medicine

## 2015-08-11 VITALS — BP 148/83 | HR 63 | Temp 98.3°F | Resp 20 | Ht 74.0 in | Wt 206.3 lb

## 2015-08-11 DIAGNOSIS — C7931 Secondary malignant neoplasm of brain: Secondary | ICD-10-CM

## 2015-08-11 DIAGNOSIS — C3491 Malignant neoplasm of unspecified part of right bronchus or lung: Secondary | ICD-10-CM

## 2015-08-11 DIAGNOSIS — R197 Diarrhea, unspecified: Secondary | ICD-10-CM

## 2015-08-11 DIAGNOSIS — C787 Secondary malignant neoplasm of liver and intrahepatic bile duct: Secondary | ICD-10-CM

## 2015-08-11 DIAGNOSIS — R21 Rash and other nonspecific skin eruption: Secondary | ICD-10-CM

## 2015-08-11 DIAGNOSIS — R634 Abnormal weight loss: Secondary | ICD-10-CM

## 2015-08-11 DIAGNOSIS — C7951 Secondary malignant neoplasm of bone: Secondary | ICD-10-CM

## 2015-08-11 DIAGNOSIS — F329 Major depressive disorder, single episode, unspecified: Secondary | ICD-10-CM

## 2015-08-11 DIAGNOSIS — C3411 Malignant neoplasm of upper lobe, right bronchus or lung: Secondary | ICD-10-CM

## 2015-08-11 DIAGNOSIS — L03039 Cellulitis of unspecified toe: Secondary | ICD-10-CM

## 2015-08-11 LAB — CBC WITH DIFFERENTIAL/PLATELET
BASO%: 0.5 % (ref 0.0–2.0)
BASOS ABS: 0 10*3/uL (ref 0.0–0.1)
EOS ABS: 0.3 10*3/uL (ref 0.0–0.5)
EOS%: 4.3 % (ref 0.0–7.0)
HEMATOCRIT: 40.9 % (ref 38.4–49.9)
HGB: 13.6 g/dL (ref 13.0–17.1)
LYMPH#: 1.3 10*3/uL (ref 0.9–3.3)
LYMPH%: 20.7 % (ref 14.0–49.0)
MCH: 29.5 pg (ref 27.2–33.4)
MCHC: 33.2 g/dL (ref 32.0–36.0)
MCV: 88.9 fL (ref 79.3–98.0)
MONO#: 0.4 10*3/uL (ref 0.1–0.9)
MONO%: 6.7 % (ref 0.0–14.0)
NEUT#: 4.2 10*3/uL (ref 1.5–6.5)
NEUT%: 67.8 % (ref 39.0–75.0)
PLATELETS: 219 10*3/uL (ref 140–400)
RBC: 4.6 10*6/uL (ref 4.20–5.82)
RDW: 13.2 % (ref 11.0–14.6)
WBC: 6.2 10*3/uL (ref 4.0–10.3)

## 2015-08-11 LAB — COMPREHENSIVE METABOLIC PANEL (CC13)
ALT: 31 U/L (ref 0–55)
ANION GAP: 7 meq/L (ref 3–11)
AST: 29 U/L (ref 5–34)
Albumin: 3.6 g/dL (ref 3.5–5.0)
Alkaline Phosphatase: 113 U/L (ref 40–150)
BUN: 8.6 mg/dL (ref 7.0–26.0)
CHLORIDE: 106 meq/L (ref 98–109)
CO2: 26 mEq/L (ref 22–29)
Calcium: 9 mg/dL (ref 8.4–10.4)
Creatinine: 1.1 mg/dL (ref 0.7–1.3)
EGFR: 79 mL/min/{1.73_m2} — AB (ref >90)
Glucose: 83 mg/dl (ref 70–140)
Potassium: 3.9 mEq/L (ref 3.5–5.1)
Sodium: 140 mEq/L (ref 136–145)
Total Bilirubin: 0.53 mg/dL (ref 0.20–1.20)
Total Protein: 7 g/dL (ref 6.4–8.3)

## 2015-08-11 MED ORDER — DOXYCYCLINE HYCLATE 100 MG PO TABS: 100.0000 mg | ORAL_TABLET | Freq: Two times a day (BID) | ORAL | Status: DC

## 2015-08-11 NOTE — Telephone Encounter (Signed)
per pof to sch pt appt-gave pt copy of avs °

## 2015-08-11 NOTE — Progress Notes (Signed)
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  Primary site: Lung (Right)  Staging method: AJCC 7th Edition  Clinical: Stage IV (T2a, N0, M1b) signed by [name], MD on 03/26/2014 4:57 PM  Summary: Stage IV (T2a, N0, M1b)   PRIOR THERAPY: Status post whole brain irradiation under the care Dr. Tammi Klippel completed 04/12/2014   CURRENT THERAPY:  1) Gilotrif 40 mg po daily - therapy beginning 04/03/2014. Status post approximately 15 months of therapy  2) Xgeva 120 mcg subcutaneously on monthly basis.   DISEASE STAGE:  Lung cancer  Primary site: Lung (Right)  Staging method: AJCC 7th Edition  Clinical: Stage IV (T2a, N0, M1b) signed by [name], MD on 03/26/2014 4:57 PM  Summary: Stage IV (T2a, N0, M1b)  CHEMOTHERAPY INTENT: palliative  CURRENT # OF CHEMOTHERAPY CYCLES: 16 CURRENT ANTIEMETICS: none  CURRENT SMOKING STATUS: non-smoker/never smoker  ORAL CHEMOTHERAPY AND CONSENT: yes- Gilotrif  CURRENT BISPHOSPHONATES USE: none  PAIN MANAGEMENT: none  NARCOTICS INDUCED CONSTIPATION: none  LIVING WILL AND CODE STATUS:   INTERVAL HISTORY: Oscar Floyd 49 y.o. male returns to the clinic today for monthly followup visit. The patient is tolerating his treatment with Gilotrif fairly well with no significant adverse effects except for the persistent mild skin rash on the nose and upper chest, paronychia and occasional episodes of diarrhea. He is still very active and plays golf at regular basis. The patient denied having any significant chest  pain, shortness of breath, cough or hemoptysis. He has no fever or chills, no nausea or vomiting. He has repeat blood work and he is here today for evaluation and discussion of his lab results.  MEDICAL HISTORY: Past Medical History  Diagnosis Date  . Lung cancer ([place])     RUL lung with mets to liver, bone and brain  . Hypertension     ALLERGIES:  has No Known Allergies.  MEDICATIONS:  Current Outpatient Prescriptions  Medication Sig Dispense Refill  . afatinib dimaleate (GILOTRIF) 40 MG tablet Take 1 tablet (40 mg total) by mouth daily. Take on an empty stomach 1hr before or 2 hrs after meals. 30 tablet 1  . ALPRAZolam (XANAX) 1 MG tablet     . ALTABAX 1 % ointment     . amoxicillin-clavulanate (AUGMENTIN) 875-125 MG per tablet     . calcium-vitamin D (OSCAL) 250-125 MG-UNIT per tablet Take 1 tablet by mouth daily.    . diazepam (VALIUM) 10 MG tablet TAKE 1 TABLET BY MOUTH 30 MINUTES PRIOR TO PROCEDURE  0  . esomeprazole (NEXIUM) 20 MG capsule Take by mouth 2 (two) times daily before a meal.     . loperamide (IMODIUM) 2 MG capsule Take 2 mg by mouth as needed for diarrhea or loose stools.    . mirtazapine (REMERON) 30 MG tablet Take 1 tablet (30 mg total) by mouth at bedtime. 30 tablet 2  . mirtazapine (REMERON) 30 MG tablet TAKE 1 TABLET (30 MG TOTAL) BY MOUTH AT BEDTIME. 30 tablet 2  . naproxen sodium (ANAPROX) 220 MG tablet Take 220 mg by mouth  as needed.    . neomycin-polymyxin-hydrocortisone (CORTISPORIN) otic solution Apply one to two drops to toe after soaking twice daily. 10 mL 0   No current facility-administered medications for this visit.    SURGICAL HISTORY:  Past Surgical History  Procedure Laterality Date  . Vasectomy    . Video bronchoscopy Bilateral 03/20/2014    Procedure: VIDEO BRONCHOSCOPY WITH FLUORO;  Surgeon: Rigoberto Noel, MD;  Location: [facility];  Service: Cardiopulmonary;  Laterality: Bilateral;    REVIEW OF SYSTEMS:  Constitutional: negative Eyes:  negative Ears, nose, mouth, throat, and face: negative Respiratory: negative Cardiovascular: negative Gastrointestinal: negative Genitourinary:negative Integument/breast: positive for dryness and rash Hematologic/lymphatic: negative Musculoskeletal:negative Neurological: negative Behavioral/Psych: negative Endocrine: negative Allergic/Immunologic: negative   PHYSICAL EXAMINATION: General appearance: alert, cooperative and no distress Head: Normocephalic, without obvious abnormality, atraumatic Neck: no adenopathy, no JVD, supple, symmetrical, trachea midline and thyroid not enlarged, symmetric, no tenderness/mass/nodules Lymph nodes: Cervical, supraclavicular, and axillary nodes normal. Resp: clear to auscultation bilaterally Back: symmetric, no curvature. ROM normal. No CVA tenderness. Cardio: regular rate and rhythm, S1, S2 normal, no murmur, click, rub or gallop GI: soft, non-tender; bowel sounds normal; no masses,  no organomegaly Extremities: extremities normal, atraumatic, no cyanosis or edema Neurologic: Alert and oriented X 3, normal strength and tone. Normal symmetric reflexes. Normal coordination and gait  ECOG PERFORMANCE STATUS: 1 - Symptomatic but completely ambulatory  Blood pressure 148/83, pulse 63, temperature 98.3 F (36.8 C), temperature source Oral, resp. rate 20, height $RemoveBe'6\' 2"'[id number]$  (1.88 m), weight 206 lb 4.8 oz (93.577 kg), SpO2 100 %.  LABORATORY DATA: Lab Results  Component Value Date   WBC 6.2 08/11/2015   HGB 13.6 08/11/2015   HCT 40.9 08/11/2015   MCV 88.9 08/11/2015   PLT 219 08/11/2015      Chemistry      Component Value Date/Time   NA 140 08/11/2015 1444   K 3.9 08/11/2015 1444   CO2 26 08/11/2015 1444   BUN 8.6 08/11/2015 1444   CREATININE 1.1 08/11/2015 1444      Component Value Date/Time   CALCIUM 9.0 08/11/2015 1444   ALKPHOS 113 08/11/2015 1444   AST 29 08/11/2015 1444   ALT 31 08/11/2015 1444   BILITOT 0.53 08/11/2015 1444        RADIOGRAPHIC STUDIES: No results found. ASSESSMENT AND PLAN: This is a very pleasant 49 years old white male with [name], adenocarcinoma with positive EGFR mutation in exon 19 currently undergoing treatment with oral Gilotrif 40 mg by mouth daily status post 15 months and tolerating his treatment fairly well except for the grade 1-2 skin rash and paronychia as well as mild diarrhea once daily. He takes Imodium at regular basis usually in the morning. His CBC and compliance metabolic panel are unremarkable today. I recommended for him to continue his current treatment with Gilotrif at the same dose. For the skin rash, I will start the patient on doxycycline 100 mg by mouth twice a day for 10 days. For the metastatic bone disease, the patient will continue with monthly Xgeva. He will also continue on calcium and vitamin D supplement. For the weight loss and depression, he is currently on Remeron 30 mg by mouth each bedtime. He would come back for followup visit in around 4 weeks with repeat CBC and comprehensive metabolic panel as well as CT scan of the chest, abdomen and pelvis. He was advised to call immediately if he has any concerning symptoms in  the interval. The patient voices understanding of current disease status and treatment options and is in agreement with the current care plan.  All questions were answered. The patient knows to call the clinic with any problems, questions or concerns. We can certainly see the patient much sooner if necessary.  Disclaimer: This note was dictated with voice recognition software. Similar sounding words can inadvertently be transcribed and may not be corrected upon review.

## 2015-08-15 ENCOUNTER — Ambulatory Visit (HOSPITAL_BASED_OUTPATIENT_CLINIC_OR_DEPARTMENT_OTHER): Payer: BLUE CROSS/BLUE SHIELD

## 2015-08-15 ENCOUNTER — Other Ambulatory Visit: Payer: Self-pay | Admitting: *Deleted

## 2015-08-15 VITALS — BP 137/89 | HR 72 | Temp 98.6°F

## 2015-08-15 DIAGNOSIS — C3400 Malignant neoplasm of unspecified main bronchus: Secondary | ICD-10-CM

## 2015-08-15 DIAGNOSIS — C7951 Secondary malignant neoplasm of bone: Secondary | ICD-10-CM

## 2015-08-15 DIAGNOSIS — C7931 Secondary malignant neoplasm of brain: Secondary | ICD-10-CM

## 2015-08-15 DIAGNOSIS — C7949 Secondary malignant neoplasm of other parts of nervous system: Principal | ICD-10-CM

## 2015-08-15 MED ORDER — DENOSUMAB 120 MG/1.7ML ~~LOC~~ SOLN
120.0000 mg | Freq: Once | SUBCUTANEOUS | Status: AC
  Administered 2015-08-15: 120 mg via SUBCUTANEOUS
  Filled 2015-08-15: qty 1.7

## 2015-08-22 ENCOUNTER — Ambulatory Visit (HOSPITAL_COMMUNITY)
Admission: RE | Admit: 2015-08-22 | Discharge: 2015-08-22 | Disposition: A | Discharge status: Home / Self Care | Payer: BLUE CROSS/BLUE SHIELD | Source: Ambulatory Visit | Attending: Internal Medicine | Admitting: Internal Medicine

## 2015-08-22 ENCOUNTER — Encounter (HOSPITAL_COMMUNITY): Payer: Self-pay

## 2015-08-22 ENCOUNTER — Other Ambulatory Visit (HOSPITAL_BASED_OUTPATIENT_CLINIC_OR_DEPARTMENT_OTHER): Payer: BLUE CROSS/BLUE SHIELD

## 2015-08-22 ENCOUNTER — Other Ambulatory Visit: Payer: BLUE CROSS/BLUE SHIELD

## 2015-08-22 ENCOUNTER — Encounter: Payer: Self-pay | Admitting: Radiation Therapy

## 2015-08-22 DIAGNOSIS — C7949 Secondary malignant neoplasm of other parts of nervous system: Secondary | ICD-10-CM

## 2015-08-22 DIAGNOSIS — C7931 Secondary malignant neoplasm of brain: Secondary | ICD-10-CM

## 2015-08-22 DIAGNOSIS — C3491 Malignant neoplasm of unspecified part of right bronchus or lung: Secondary | ICD-10-CM

## 2015-08-22 DIAGNOSIS — C7951 Secondary malignant neoplasm of bone: Secondary | ICD-10-CM | POA: Insufficient documentation

## 2015-08-22 LAB — CBC WITH DIFFERENTIAL/PLATELET
BASO%: 0.9 % (ref 0.0–2.0)
Basophils Absolute: 0 10*3/uL (ref 0.0–0.1)
EOS%: 6.2 % (ref 0.0–7.0)
Eosinophils Absolute: 0.3 10*3/uL (ref 0.0–0.5)
HEMATOCRIT: 42.2 % (ref 38.4–49.9)
HGB: 14.1 g/dL (ref 13.0–17.1)
LYMPH#: 1.7 10*3/uL (ref 0.9–3.3)
LYMPH%: 32.8 % (ref 14.0–49.0)
MCH: 29.7 pg (ref 27.2–33.4)
MCHC: 33.4 g/dL (ref 32.0–36.0)
MCV: 88.8 fL (ref 79.3–98.0)
MONO#: 0.4 10*3/uL (ref 0.1–0.9)
MONO%: 8.3 % (ref 0.0–14.0)
NEUT#: 2.6 10*3/uL (ref 1.5–6.5)
NEUT%: 51.8 % (ref 39.0–75.0)
Platelets: 205 10*3/uL (ref 140–400)
RBC: 4.76 10*6/uL (ref 4.20–5.82)
RDW: 13.4 % (ref 11.0–14.6)
WBC: 5.1 10*3/uL (ref 4.0–10.3)

## 2015-08-22 LAB — COMPREHENSIVE METABOLIC PANEL
ALT: 25 U/L (ref 0–55)
AST: 26 U/L (ref 5–34)
Albumin: 3.6 g/dL (ref 3.5–5.0)
Alkaline Phosphatase: 106 U/L (ref 40–150)
Anion Gap: 9 mEq/L (ref 3–11)
BUN: 12.6 mg/dL (ref 7.0–26.0)
CHLORIDE: 107 meq/L (ref 98–109)
CO2: 26 meq/L (ref 22–29)
CREATININE: 1.1 mg/dL (ref 0.7–1.3)
Calcium: 9.4 mg/dL (ref 8.4–10.4)
EGFR: 78 mL/min/{1.73_m2} — ABNORMAL LOW (ref >90)
GLUCOSE: 91 mg/dL (ref 70–140)
Potassium: 4.4 mEq/L (ref 3.5–5.1)
SODIUM: 142 meq/L (ref 136–145)
Total Bilirubin: 0.78 mg/dL (ref 0.20–1.20)
Total Protein: 6.7 g/dL (ref 6.4–8.3)

## 2015-08-22 MED ORDER — IOHEXOL 300 MG/ML  SOLN
50.0000 mL | Freq: Once | INTRAMUSCULAR | Status: AC | PRN
  Administered 2015-08-22: 50 mL via ORAL

## 2015-08-22 MED ORDER — IOHEXOL 300 MG/ML  SOLN
100.0000 mL | Freq: Once | INTRAMUSCULAR | Status: AC | PRN
  Administered 2015-08-22: 100 mL via INTRAVENOUS

## 2015-08-22 NOTE — Progress Notes (Addendum)
1.  Do you need a wheel chair?    no  2. On oxygen? no  3. Have you ever had any surgery in the body part being scanned?  no  4. Have you ever had any surgery on your brain or heart? no                  5. Have you ever had surgery on your eyes or ears? no                                        6. Do you have a pacemaker or defibrillator? no    7. Do you have a Neurostimulator?    no     8. Claustrophobic? Yes- takes ativan for scan  9. Any risk for metal in eyes?  no  10. Injury by bullet, buckshot, or shrapnel?  no  11. Stent?     no                                                                                                               12. Hx of Cancer?   yes    Lung cancer with mets to the brain                                                                                                      13. Kidney or Liver disease?  no  14. Hx of Lupus, Rheumatoid Arthritis or Scleroderma?  no  15. IV Antibiotics or long term use of NSAIDS?  no  16. HX of Hypertension?  no  17. Diabetes?  no  18. Allergy to contrast?  no  19. Recent labs. Yes in EPIC- drawn 12/9  Previous XRT:  Radiation treatment dates: 04/01/2014-04/12/2014  Site/dose: The whole brain was treated to 30 Gy in 10 fractions of 3 Gy  Oscar Floyd

## 2015-08-26 ENCOUNTER — Telehealth: Payer: Self-pay | Admitting: Internal Medicine

## 2015-08-26 ENCOUNTER — Ambulatory Visit (HOSPITAL_BASED_OUTPATIENT_CLINIC_OR_DEPARTMENT_OTHER): Payer: BLUE CROSS/BLUE SHIELD | Admitting: Internal Medicine

## 2015-08-26 ENCOUNTER — Encounter: Payer: Self-pay | Admitting: Internal Medicine

## 2015-08-26 VITALS — BP 128/68 | HR 79 | Temp 98.1°F | Resp 18 | Ht 74.0 in | Wt 205.6 lb

## 2015-08-26 DIAGNOSIS — C3411 Malignant neoplasm of upper lobe, right bronchus or lung: Secondary | ICD-10-CM

## 2015-08-26 DIAGNOSIS — C7931 Secondary malignant neoplasm of brain: Secondary | ICD-10-CM | POA: Diagnosis not present

## 2015-08-26 DIAGNOSIS — K521 Toxic gastroenteritis and colitis: Secondary | ICD-10-CM

## 2015-08-26 DIAGNOSIS — F329 Major depressive disorder, single episode, unspecified: Secondary | ICD-10-CM

## 2015-08-26 DIAGNOSIS — L270 Generalized skin eruption due to drugs and medicaments taken internally: Secondary | ICD-10-CM | POA: Diagnosis not present

## 2015-08-26 DIAGNOSIS — C3491 Malignant neoplasm of unspecified part of right bronchus or lung: Secondary | ICD-10-CM

## 2015-08-26 DIAGNOSIS — C7951 Secondary malignant neoplasm of bone: Secondary | ICD-10-CM

## 2015-08-26 DIAGNOSIS — C7949 Secondary malignant neoplasm of other parts of nervous system: Secondary | ICD-10-CM

## 2015-08-26 DIAGNOSIS — L03039 Cellulitis of unspecified toe: Secondary | ICD-10-CM

## 2015-08-26 DIAGNOSIS — L03011 Cellulitis of right finger: Secondary | ICD-10-CM

## 2015-08-26 DIAGNOSIS — R634 Abnormal weight loss: Secondary | ICD-10-CM

## 2015-08-26 HISTORY — DX: Secondary malignant neoplasm of bone: C79.51

## 2015-08-26 NOTE — Telephone Encounter (Signed)
Gave and printed appt sched and avs for pt for Jan and Feb 2017

## 2015-08-26 NOTE — Progress Notes (Signed)
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  Primary site: Lung (Right)  Staging method: AJCC 7th Edition  Clinical: Stage IV (T2a, N0, M1b) signed by [name], MD on 03/26/2014 4:57 PM  Summary: Stage IV (T2a, N0, M1b)   PRIOR THERAPY: Status post whole brain irradiation under the care Dr. Tammi Klippel completed 04/12/2014   CURRENT THERAPY:  1) Gilotrif 40 mg po daily - therapy beginning 04/03/2014. Status post approximately 16 months of therapy  2) Xgeva 120 mcg subcutaneously on monthly basis.   DISEASE STAGE:  Lung cancer  Primary site: Lung (Right)  Staging method: AJCC 7th Edition  Clinical: Stage IV (T2a, N0, M1b) signed by [name], MD on 03/26/2014 4:57 PM  Summary: Stage IV (T2a, N0, M1b)  CHEMOTHERAPY INTENT: palliative  CURRENT # OF CHEMOTHERAPY CYCLES: 17 CURRENT ANTIEMETICS: none  CURRENT SMOKING STATUS: non-smoker/never smoker  ORAL CHEMOTHERAPY AND CONSENT: yes- Gilotrif  CURRENT BISPHOSPHONATES USE: none  PAIN MANAGEMENT: none  NARCOTICS INDUCED CONSTIPATION: none  LIVING WILL AND CODE STATUS:   INTERVAL HISTORY: Corwyn Vora 49 y.o. male returns to the clinic today for monthly followup visit. The patient is tolerating his treatment with Gilotrif fairly well with no significant adverse effects except for the persistent mild skin rash on the nose and upper chest and back. The paronychia and diarrhea. I will much better controlled. The patient continues to work full-time. The patient denied having any significant  chest pain, shortness of breath, cough or hemoptysis. He has no fever or chills, no nausea or vomiting. He has repeat blood work as well as CT scan of the chest, abdomen and pelvis performed recently and he is here today for evaluation and discussion of his lab and his scan results.  MEDICAL HISTORY: Past Medical History  Diagnosis Date  . Hypertension   . Lung cancer ([place])     RUL lung with mets to liver, bone and brain    ALLERGIES:  has No Known Allergies.  MEDICATIONS:  Current Outpatient Prescriptions  Medication Sig Dispense Refill  . afatinib dimaleate (GILOTRIF) 40 MG tablet Take 1 tablet (40 mg total) by mouth daily. Take on an empty stomach 1hr before or 2 hrs after meals. 30 tablet 1  . ALPRAZolam (XANAX) 1 MG tablet     . ALTABAX 1 % ointment     . amoxicillin-clavulanate (AUGMENTIN) 875-125 MG per tablet     . calcium-vitamin D (OSCAL) 250-125 MG-UNIT per tablet Take 1 tablet by mouth daily.    . diazepam (VALIUM) 10 MG tablet TAKE 1 TABLET BY MOUTH 30 MINUTES PRIOR TO PROCEDURE  0  . doxycycline (VIBRA-TABS) 100 MG tablet Take 1 tablet (100 mg total) by mouth 2 (two) times daily. 20 tablet 0  . esomeprazole (NEXIUM) 20 MG capsule Take by mouth 2 (two) times daily before a meal.     . loperamide (IMODIUM) 2 MG capsule Take 2 mg by mouth as needed for diarrhea or loose stools.    . mirtazapine (REMERON) 30 MG tablet Take 1 tablet (30 mg total) by mouth  at bedtime. 30 tablet 2  . mirtazapine (REMERON) 30 MG tablet TAKE 1 TABLET (30 MG TOTAL) BY MOUTH AT BEDTIME. 30 tablet 2  . naproxen sodium (ANAPROX) 220 MG tablet Take 220 mg by mouth as needed.    . neomycin-polymyxin-hydrocortisone (CORTISPORIN) otic solution Apply one to two drops to toe after soaking twice daily. 10 mL 0   No current facility-administered medications for this visit.    SURGICAL HISTORY:  Past Surgical History  Procedure Laterality Date  . Vasectomy    . Video bronchoscopy Bilateral 03/20/2014     Procedure: VIDEO BRONCHOSCOPY WITH FLUORO;  Surgeon: Rigoberto Noel, MD;  Location: [facility];  Service: Cardiopulmonary;  Laterality: Bilateral;    REVIEW OF SYSTEMS:  Constitutional: negative Eyes: negative Ears, nose, mouth, throat, and face: negative Respiratory: negative Cardiovascular: negative Gastrointestinal: negative Genitourinary:negative Integument/breast: positive for dryness and rash Hematologic/lymphatic: negative Musculoskeletal:negative Neurological: negative Behavioral/Psych: negative Endocrine: negative Allergic/Immunologic: negative   PHYSICAL EXAMINATION: General appearance: alert, cooperative and no distress Head: Normocephalic, without obvious abnormality, atraumatic Neck: no adenopathy, no JVD, supple, symmetrical, trachea midline and thyroid not enlarged, symmetric, no tenderness/mass/nodules Lymph nodes: Cervical, supraclavicular, and axillary nodes normal. Resp: clear to auscultation bilaterally Back: symmetric, no curvature. ROM normal. No CVA tenderness. Cardio: regular rate and rhythm, S1, S2 normal, no murmur, click, rub or gallop GI: soft, non-tender; bowel sounds normal; no masses,  no organomegaly Extremities: extremities normal, atraumatic, no cyanosis or edema Neurologic: Alert and oriented X 3, normal strength and tone. Normal symmetric reflexes. Normal coordination and gait  ECOG PERFORMANCE STATUS: 1 - Symptomatic but completely ambulatory  There were no vitals taken for this visit.  LABORATORY DATA: Lab Results  Component Value Date   WBC 5.1 08/22/2015   HGB 14.1 08/22/2015   HCT 42.2 08/22/2015   MCV 88.8 08/22/2015   PLT 205 08/22/2015      Chemistry      Component Value Date/Time   NA 142 08/22/2015 0807   K 4.4 08/22/2015 0807   CO2 26 08/22/2015 0807   BUN 12.6 08/22/2015 0807   CREATININE 1.1 08/22/2015 0807      Component Value Date/Time   CALCIUM 9.4 08/22/2015 0807   ALKPHOS 106 08/22/2015 0807   AST 26  08/22/2015 0807   ALT 25 08/22/2015 0807   BILITOT 0.78 08/22/2015 0807       RADIOGRAPHIC STUDIES: Ct Chest W Contrast  08/22/2015  CLINICAL DATA:  Followup right lung non-small cell carcinoma. Currently undergoing oral chemotherapy. Previous radiation therapy. EXAM: CT CHEST, ABDOMEN, AND PELVIS WITH CONTRAST TECHNIQUE: Multidetector CT imaging of the chest, abdomen and pelvis was performed following the standard protocol during bolus administration of intravenous contrast. CONTRAST:  136m OMNIPAQUE IOHEXOL 300 MG/ML  SOLN COMPARISON:  05/23/2015 FINDINGS: CT CHEST FINDINGS Mediastinum/Lymph Nodes: No masses, pathologically enlarged lymph nodes, or other significant abnormality. Lungs/Pleura: Spiculated nodule in the anterior right upper lobe measures 11 x 16 mm on image 31/ series 5, without significant change compared to previous study. Numerous tiny less than 5 mm widely scattered pulmonary nodules are again seen both lungs which are also stable. No new or enlarging pulmonary nodules or masses identified. No evidence of acute infiltrate or pleural effusion. Musculoskeletal: Diffuse sclerotic bone metastases show no significant change compared to previous exam. CT ABDOMEN PELVIS FINDINGS Hepatobiliary: No masses or other significant abnormality. Pancreas: No mass, inflammatory changes, or other significant abnormality. Spleen: Within normal limits in size and appearance. Adrenals/Urinary Tract: No masses identified.  No evidence of hydronephrosis. Stomach/Bowel: No evidence of obstruction, inflammatory process, or abnormal fluid collections. Vascular/Lymphatic: No pathologically enlarged lymph nodes. No evidence of abdominal aortic aneursym. Reproductive: No mass or other significant abnormality. Other: None. Musculoskeletal: Diffuse sclerotic bone metastases remain stable compared to previous study. IMPRESSION: No significant change in 1.6 cm spiculated right upper lobe pulmonary nodule. Fine  nodularity throughout both lungs is also unchanged. Stable appearance of diffuse sclerotic bone metastases. No new or progressive disease identified within the chest, abdomen, or pelvis. Electronically Signed   By: Earle Gell M.D.   On: 08/22/2015 11:56   Ct Abdomen Pelvis W Contrast  08/22/2015  CLINICAL DATA:  Followup right lung non-small cell carcinoma. Currently undergoing oral chemotherapy. Previous radiation therapy. EXAM: CT CHEST, ABDOMEN, AND PELVIS WITH CONTRAST TECHNIQUE: Multidetector CT imaging of the chest, abdomen and pelvis was performed following the standard protocol during bolus administration of intravenous contrast. CONTRAST:  124m OMNIPAQUE IOHEXOL 300 MG/ML  SOLN COMPARISON:  05/23/2015 FINDINGS: CT CHEST FINDINGS Mediastinum/Lymph Nodes: No masses, pathologically enlarged lymph nodes, or other significant abnormality. Lungs/Pleura: Spiculated nodule in the anterior right upper lobe measures 11 x 16 mm on image 31/ series 5, without significant change compared to previous study. Numerous tiny less than 5 mm widely scattered pulmonary nodules are again seen both lungs which are also stable. No new or enlarging pulmonary nodules or masses identified. No evidence of acute infiltrate or pleural effusion. Musculoskeletal: Diffuse sclerotic bone metastases show no significant change compared to previous exam. CT ABDOMEN PELVIS FINDINGS Hepatobiliary: No masses or other significant abnormality. Pancreas: No mass, inflammatory changes, or other significant abnormality. Spleen: Within normal limits in size and appearance. Adrenals/Urinary Tract: No masses identified. No evidence of hydronephrosis. Stomach/Bowel: No evidence of obstruction, inflammatory process, or abnormal fluid collections. Vascular/Lymphatic: No pathologically enlarged lymph nodes. No evidence of abdominal aortic aneursym. Reproductive: No mass or other significant abnormality. Other: None. Musculoskeletal: Diffuse sclerotic  bone metastases remain stable compared to previous study. IMPRESSION: No significant change in 1.6 cm spiculated right upper lobe pulmonary nodule. Fine nodularity throughout both lungs is also unchanged. Stable appearance of diffuse sclerotic bone metastases. No new or progressive disease identified within the chest, abdomen, or pelvis. Electronically Signed   By: JEarle GellM.D.   On: 08/22/2015 11:56   ASSESSMENT AND PLAN: This is a very pleasant 49years old white male with [name], adenocarcinoma with positive EGFR mutation in exon 19 currently undergoing treatment with oral Gilotrif 40 mg by mouth daily status post 16 months and tolerating his treatment fairly well except for the grade 1 skin rash and mild paronychia of the toes as well as mild diarrhea once daily. He takes Imodium at regular basis usually in the morning. The recent CT scan of the chest, abdomen and pelvis showed no evidence for disease progression. I discussed the scan results with the patient today. I recommended for him to continue his current treatment with Gilotrif at the same dose. For the metastatic bone disease, the patient will continue with monthly Xgeva. He will also continue on calcium and vitamin D supplement. For the weight loss and depression, he is currently on Remeron 30 mg by mouth each bedtime. He would come back for followup visit in around 2 months with repeat CBC and comprehensive metabolic panel. He was advised to call immediately if he has any concerning symptoms in the interval. The patient voices understanding of current disease status and treatment options  and is in agreement with the current care plan.  All questions were answered. The patient knows to call the clinic with any problems, questions or concerns. We can certainly see the patient much sooner if necessary.  Disclaimer: This note was dictated with voice recognition software. Similar sounding words can  inadvertently be transcribed and may not be corrected upon review.

## 2015-08-26 NOTE — Addendum Note (Signed)
Addended by: Ardeen Garland on: 08/26/2015 09:11 AM   Modules accepted: Orders, Medications

## 2015-08-31 ENCOUNTER — Other Ambulatory Visit: Payer: Self-pay | Admitting: Internal Medicine

## 2015-09-04 ENCOUNTER — Ambulatory Visit: Payer: BLUE CROSS/BLUE SHIELD | Admitting: Internal Medicine

## 2015-09-04 ENCOUNTER — Other Ambulatory Visit: Payer: BLUE CROSS/BLUE SHIELD

## 2015-09-09 ENCOUNTER — Ambulatory Visit: Payer: BLUE CROSS/BLUE SHIELD | Admitting: Podiatry

## 2015-09-09 ENCOUNTER — Ambulatory Visit: Payer: BLUE CROSS/BLUE SHIELD

## 2015-09-12 ENCOUNTER — Ambulatory Visit: Payer: BLUE CROSS/BLUE SHIELD

## 2015-09-17 ENCOUNTER — Encounter (HOSPITAL_COMMUNITY): Payer: Self-pay

## 2015-09-19 ENCOUNTER — Ambulatory Visit
Admission: RE | Admit: 2015-09-19 | Discharge: 2015-09-19 | Disposition: A | Discharge status: Home / Self Care | Payer: BLUE CROSS/BLUE SHIELD | Source: Ambulatory Visit | Attending: Radiation Oncology | Admitting: Radiation Oncology

## 2015-09-19 DIAGNOSIS — C7949 Secondary malignant neoplasm of other parts of nervous system: Principal | ICD-10-CM

## 2015-09-19 DIAGNOSIS — C7931 Secondary malignant neoplasm of brain: Secondary | ICD-10-CM

## 2015-09-19 MED ORDER — GADOBENATE DIMEGLUMINE 529 MG/ML IV SOLN
20.0000 mL | Freq: Once | INTRAVENOUS | Status: AC | PRN
  Administered 2015-09-19: 20 mL via INTRAVENOUS

## 2015-09-21 NOTE — Progress Notes (Signed)
I called patient with scan results.  Will get next MRI in 2 months then follow-up.

## 2015-09-22 ENCOUNTER — Ambulatory Visit: Payer: BLUE CROSS/BLUE SHIELD | Admitting: Radiation Oncology

## 2015-09-24 ENCOUNTER — Other Ambulatory Visit: Payer: Self-pay | Admitting: *Deleted

## 2015-09-24 DIAGNOSIS — C7949 Secondary malignant neoplasm of other parts of nervous system: Secondary | ICD-10-CM

## 2015-09-24 DIAGNOSIS — R918 Other nonspecific abnormal finding of lung field: Secondary | ICD-10-CM

## 2015-09-24 DIAGNOSIS — C7931 Secondary malignant neoplasm of brain: Secondary | ICD-10-CM

## 2015-09-24 DIAGNOSIS — C7951 Secondary malignant neoplasm of bone: Secondary | ICD-10-CM

## 2015-09-24 DIAGNOSIS — C3490 Malignant neoplasm of unspecified part of unspecified bronchus or lung: Secondary | ICD-10-CM

## 2015-09-24 DIAGNOSIS — C3491 Malignant neoplasm of unspecified part of right bronchus or lung: Secondary | ICD-10-CM

## 2015-09-24 MED ORDER — AFATINIB DIMALEATE 40 MG PO TABS: 40.0000 mg | ORAL_TABLET | Freq: Every day | ORAL | Status: DC

## 2015-10-10 ENCOUNTER — Ambulatory Visit (HOSPITAL_BASED_OUTPATIENT_CLINIC_OR_DEPARTMENT_OTHER): Payer: BLUE CROSS/BLUE SHIELD

## 2015-10-10 ENCOUNTER — Other Ambulatory Visit (HOSPITAL_BASED_OUTPATIENT_CLINIC_OR_DEPARTMENT_OTHER): Payer: BLUE CROSS/BLUE SHIELD

## 2015-10-10 VITALS — BP 116/80 | HR 75 | Temp 98.3°F

## 2015-10-10 DIAGNOSIS — C7951 Secondary malignant neoplasm of bone: Secondary | ICD-10-CM | POA: Diagnosis not present

## 2015-10-10 DIAGNOSIS — C3400 Malignant neoplasm of unspecified main bronchus: Secondary | ICD-10-CM | POA: Diagnosis not present

## 2015-10-10 LAB — COMPREHENSIVE METABOLIC PANEL
ALBUMIN: 3.5 g/dL (ref 3.5–5.0)
ALK PHOS: 159 U/L — AB (ref 40–150)
ALT: 29 U/L (ref 0–55)
AST: 30 U/L (ref 5–34)
Anion Gap: 7 mEq/L (ref 3–11)
BILIRUBIN TOTAL: 0.84 mg/dL (ref 0.20–1.20)
BUN: 7.3 mg/dL (ref 7.0–26.0)
CO2: 25 meq/L (ref 22–29)
Calcium: 8.7 mg/dL (ref 8.4–10.4)
Chloride: 109 mEq/L (ref 98–109)
Creatinine: 1.1 mg/dL (ref 0.7–1.3)
EGFR: 79 mL/min/{1.73_m2} — ABNORMAL LOW (ref >90)
GLUCOSE: 93 mg/dL (ref 70–140)
Potassium: 4.1 mEq/L (ref 3.5–5.1)
SODIUM: 141 meq/L (ref 136–145)
TOTAL PROTEIN: 6.9 g/dL (ref 6.4–8.3)

## 2015-10-10 MED ORDER — DENOSUMAB 120 MG/1.7ML ~~LOC~~ SOLN
120.0000 mg | Freq: Once | SUBCUTANEOUS | Status: AC
  Administered 2015-10-10: 120 mg via SUBCUTANEOUS
  Filled 2015-10-10: qty 1.7

## 2015-10-14 ENCOUNTER — Encounter: Payer: Self-pay | Admitting: Podiatry

## 2015-10-14 ENCOUNTER — Ambulatory Visit (INDEPENDENT_AMBULATORY_CARE_PROVIDER_SITE_OTHER): Payer: BLUE CROSS/BLUE SHIELD | Admitting: Podiatry

## 2015-10-14 VITALS — BP 134/92 | HR 77 | Resp 12

## 2015-10-14 DIAGNOSIS — L600 Ingrowing nail: Secondary | ICD-10-CM | POA: Diagnosis not present

## 2015-10-14 MED ORDER — CEPHALEXIN 500 MG PO CAPS: 500.0000 mg | ORAL_CAPSULE | Freq: Three times a day (TID) | ORAL | Status: DC

## 2015-10-14 MED ORDER — NEOMYCIN-POLYMYXIN-HC 1 % OT SOLN: OTIC | Status: DC

## 2015-10-14 NOTE — Patient Instructions (Signed)

## 2015-10-14 NOTE — Progress Notes (Signed)
He presents today chief complaint of painful ingrown toenails still continues to take his chemotherapy.    Objective: vital signs stable alert and oriented 3. Pulses are palpable. Ingrown toenails to the hallux second toe and the fourth toe of the right foot. The third and fourth toes of the left foot. Sharp and rated nail margins with granulation tissue purulence of malodor is noted.   Assessment: ingrown nails associated with chemotherapy.  Plan: chemical matrixectomy's were performed to the nails in question today. He was given both oral and written home-going instructions for care and soaking of his toes. He tolerated this procedure well after local anesthesia was administered. Also wrote a prescription for Cortisporin otic to be applied twice daily after soaking. Also wrote a prescription for Keflex to be  Taken by mouth one pill  3 times a day. I will follow-up with him in 1 week.

## 2015-10-15 ENCOUNTER — Telehealth: Payer: Self-pay | Admitting: *Deleted

## 2015-10-15 MED ORDER — CEPHALEXIN 500 MG PO CAPS: 500.0000 mg | ORAL_CAPSULE | Freq: Three times a day (TID) | ORAL | Status: DC

## 2015-10-15 MED ORDER — NEOMYCIN-POLYMYXIN-HC 1 % OT SOLN: OTIC | Status: DC

## 2015-10-15 NOTE — Telephone Encounter (Addendum)
Pt states the antibiotic prescribed by Dr. Milinda Pointer yesterday is not at the [facility] [address]  I reviewed the Medication Orders and the Cephalexin and Cortisporin Otic Solution was sent to mail order pharmacy.  [facility] and informed [facility].

## 2015-10-27 ENCOUNTER — Other Ambulatory Visit: Payer: Self-pay | Admitting: Medical Oncology

## 2015-10-27 DIAGNOSIS — C7949 Secondary malignant neoplasm of other parts of nervous system: Secondary | ICD-10-CM

## 2015-10-27 DIAGNOSIS — C7951 Secondary malignant neoplasm of bone: Secondary | ICD-10-CM

## 2015-10-27 DIAGNOSIS — C3490 Malignant neoplasm of unspecified part of unspecified bronchus or lung: Secondary | ICD-10-CM

## 2015-10-27 DIAGNOSIS — R918 Other nonspecific abnormal finding of lung field: Secondary | ICD-10-CM

## 2015-10-27 DIAGNOSIS — C3491 Malignant neoplasm of unspecified part of right bronchus or lung: Secondary | ICD-10-CM

## 2015-10-27 DIAGNOSIS — C7931 Secondary malignant neoplasm of brain: Secondary | ICD-10-CM

## 2015-10-27 MED ORDER — AFATINIB DIMALEATE 40 MG PO TABS: 40.0000 mg | ORAL_TABLET | Freq: Every day | ORAL | Status: DC

## 2015-10-30 ENCOUNTER — Encounter: Payer: Self-pay | Admitting: Podiatry

## 2015-10-30 ENCOUNTER — Ambulatory Visit (INDEPENDENT_AMBULATORY_CARE_PROVIDER_SITE_OTHER): Payer: BLUE CROSS/BLUE SHIELD | Admitting: Podiatry

## 2015-10-30 DIAGNOSIS — L600 Ingrowing nail: Secondary | ICD-10-CM

## 2015-10-30 NOTE — Patient Instructions (Signed)

## 2015-11-01 NOTE — Progress Notes (Signed)
He presents today for follow-up of multiple matrixectomy's bilateral toes. He states that he is doing discrete and he feels so much better. He continues to soak regularly.  Objective: Vital signs are stable he is alert and oriented 3. Pulses are palpable. No open lesions or wounds. He does have scabs present overlying all the surgical sites I see no signs of infection.  Assessment: Well-healing matrixectomy's bilateral foot.  Plan: Continue soaks until completely resolved follow-up with me with any questions or concerns or worsening of condition.

## 2015-11-06 ENCOUNTER — Ambulatory Visit (HOSPITAL_BASED_OUTPATIENT_CLINIC_OR_DEPARTMENT_OTHER): Payer: BLUE CROSS/BLUE SHIELD

## 2015-11-06 ENCOUNTER — Encounter: Payer: Self-pay | Admitting: Internal Medicine

## 2015-11-06 ENCOUNTER — Ambulatory Visit (HOSPITAL_BASED_OUTPATIENT_CLINIC_OR_DEPARTMENT_OTHER): Payer: BLUE CROSS/BLUE SHIELD | Admitting: Internal Medicine

## 2015-11-06 ENCOUNTER — Telehealth: Payer: Self-pay | Admitting: Internal Medicine

## 2015-11-06 ENCOUNTER — Other Ambulatory Visit: Payer: Self-pay | Admitting: Radiation Therapy

## 2015-11-06 ENCOUNTER — Other Ambulatory Visit: Payer: Self-pay | Admitting: Radiation Oncology

## 2015-11-06 ENCOUNTER — Other Ambulatory Visit (HOSPITAL_BASED_OUTPATIENT_CLINIC_OR_DEPARTMENT_OTHER): Payer: BLUE CROSS/BLUE SHIELD

## 2015-11-06 VITALS — BP 123/77 | HR 83 | Temp 98.4°F | Resp 18 | Ht 74.0 in | Wt 208.0 lb

## 2015-11-06 DIAGNOSIS — C3400 Malignant neoplasm of unspecified main bronchus: Secondary | ICD-10-CM

## 2015-11-06 DIAGNOSIS — C787 Secondary malignant neoplasm of liver and intrahepatic bile duct: Secondary | ICD-10-CM

## 2015-11-06 DIAGNOSIS — F329 Major depressive disorder, single episode, unspecified: Secondary | ICD-10-CM

## 2015-11-06 DIAGNOSIS — C3411 Malignant neoplasm of upper lobe, right bronchus or lung: Secondary | ICD-10-CM | POA: Diagnosis not present

## 2015-11-06 DIAGNOSIS — L270 Generalized skin eruption due to drugs and medicaments taken internally: Secondary | ICD-10-CM

## 2015-11-06 DIAGNOSIS — C7931 Secondary malignant neoplasm of brain: Secondary | ICD-10-CM

## 2015-11-06 DIAGNOSIS — C7949 Secondary malignant neoplasm of other parts of nervous system: Secondary | ICD-10-CM

## 2015-11-06 DIAGNOSIS — C7951 Secondary malignant neoplasm of bone: Secondary | ICD-10-CM

## 2015-11-06 DIAGNOSIS — C3491 Malignant neoplasm of unspecified part of right bronchus or lung: Secondary | ICD-10-CM

## 2015-11-06 DIAGNOSIS — L03011 Cellulitis of right finger: Secondary | ICD-10-CM

## 2015-11-06 DIAGNOSIS — R634 Abnormal weight loss: Secondary | ICD-10-CM

## 2015-11-06 DIAGNOSIS — K521 Toxic gastroenteritis and colitis: Secondary | ICD-10-CM

## 2015-11-06 LAB — CBC WITH DIFFERENTIAL/PLATELET
BASO%: 0.7 % (ref 0.0–2.0)
BASOS ABS: 0 10*3/uL (ref 0.0–0.1)
EOS ABS: 0.4 10*3/uL (ref 0.0–0.5)
EOS%: 7.6 % — AB (ref 0.0–7.0)
HEMATOCRIT: 41.1 % (ref 38.4–49.9)
HEMOGLOBIN: 13.7 g/dL (ref 13.0–17.1)
LYMPH#: 1.4 10*3/uL (ref 0.9–3.3)
LYMPH%: 25.9 % (ref 14.0–49.0)
MCH: 29 pg (ref 27.2–33.4)
MCHC: 33.2 g/dL (ref 32.0–36.0)
MCV: 87.4 fL (ref 79.3–98.0)
MONO#: 0.3 10*3/uL (ref 0.1–0.9)
MONO%: 5.3 % (ref 0.0–14.0)
NEUT#: 3.3 10*3/uL (ref 1.5–6.5)
NEUT%: 60.5 % (ref 39.0–75.0)
Platelets: 204 10*3/uL (ref 140–400)
RBC: 4.7 10*6/uL (ref 4.20–5.82)
RDW: 13.7 % (ref 11.0–14.6)
WBC: 5.5 10*3/uL (ref 4.0–10.3)

## 2015-11-06 LAB — COMPREHENSIVE METABOLIC PANEL
ALBUMIN: 3.5 g/dL (ref 3.5–5.0)
ALK PHOS: 148 U/L (ref 40–150)
ALT: 33 U/L (ref 0–55)
ANION GAP: 7 meq/L (ref 3–11)
AST: 33 U/L (ref 5–34)
BUN: 10.4 mg/dL (ref 7.0–26.0)
CALCIUM: 8.8 mg/dL (ref 8.4–10.4)
CHLORIDE: 108 meq/L (ref 98–109)
CO2: 25 mEq/L (ref 22–29)
Creatinine: 1 mg/dL (ref 0.7–1.3)
EGFR: 88 mL/min/{1.73_m2} — AB (ref >90)
Glucose: 108 mg/dl (ref 70–140)
POTASSIUM: 4.1 meq/L (ref 3.5–5.1)
Sodium: 141 mEq/L (ref 136–145)
Total Bilirubin: 0.71 mg/dL (ref 0.20–1.20)
Total Protein: 6.7 g/dL (ref 6.4–8.3)

## 2015-11-06 MED ORDER — DIAZEPAM 10 MG PO TABS: 10.0000 mg | ORAL_TABLET | ORAL | Status: DC

## 2015-11-06 MED ORDER — DENOSUMAB 120 MG/1.7ML ~~LOC~~ SOLN
120.0000 mg | Freq: Once | SUBCUTANEOUS | Status: AC
  Administered 2015-11-06: 120 mg via SUBCUTANEOUS
  Filled 2015-11-06: qty 1.7

## 2015-11-06 NOTE — Telephone Encounter (Signed)
per pof to sch pt appt-gave pt copy of avs-adv [facility] will call to sch trmt °

## 2015-11-06 NOTE — Progress Notes (Signed)
[facility] [phone]   Fax:(336) [phone]  OFFICE PROGRESS NOTE  [name], MD [place] [address]  DIAGNOSIS: stage IV (T2a, N0, M1b) non-small cell lung cancer consistent with poorly differentiated adenocarcinoma with positive EGFR mutation with deletion in exon 19, diagnosed in July of 2015 and presented with right upper lobe lung mass in addition to extensive liver, brain and bone metastases.  Primary site: Lung (Right)  Staging method: AJCC 7th Edition  Clinical: Stage IV (T2a, N0, M1b) signed by [name], MD on 03/26/2014 4:57 PM  Summary: Stage IV (T2a, N0, M1b)   PRIOR THERAPY: Status post whole brain irradiation under the care Dr. Tammi Klippel completed 04/12/2014   CURRENT THERAPY:  1) Gilotrif 40 mg po daily - therapy beginning 04/03/2014. Status post approximately 18 months of therapy  2) Xgeva 120 mcg subcutaneously on monthly basis.   DISEASE STAGE:  Lung cancer  Primary site: Lung (Right)  Staging method: AJCC 7th Edition  Clinical: Stage IV (T2a, N0, M1b) signed by [name], MD on 03/26/2014 4:57 PM  Summary: Stage IV (T2a, N0, M1b)  CHEMOTHERAPY INTENT: palliative  CURRENT # OF CHEMOTHERAPY CYCLES: 19 CURRENT ANTIEMETICS: none  CURRENT SMOKING STATUS: non-smoker/never smoker  ORAL CHEMOTHERAPY AND CONSENT: yes- Gilotrif  CURRENT BISPHOSPHONATES USE: none  PAIN MANAGEMENT: none  NARCOTICS INDUCED CONSTIPATION: none  LIVING WILL AND CODE STATUS:   INTERVAL HISTORY: Oscar Floyd 50 y.o. male returns to the clinic today for 2 month followup visit. The patient is tolerating his treatment with Gilotrif fairly well with no significant adverse effects except for the persistent mild skin rash on the nose and upper chest and back. He saw a podiatrist for the paronychia and he started him on cephalexin with significant improvement of his rash and the paronychia. The patient continues to work  full-time and plays golf at regular basis. The patient denied having any significant chest pain, shortness of breath, cough or hemoptysis. He has no fever or chills, no nausea or vomiting. He had repeat CBC, and comprehensive metabolic panel performed earlier today and he is here for evaluation and discussion of his lab results.  MEDICAL HISTORY: Past Medical History  Diagnosis Date  . Hypertension   . Lung cancer ([facility] [place])     RUL lung with mets to liver, bone and brain  . Bone metastases ([place]) 08/26/2015    ALLERGIES:  has No Known Allergies.  MEDICATIONS:  Current Outpatient Prescriptions  Medication Sig Dispense Refill  . afatinib dimaleate (GILOTRIF) 40 MG tablet Take 1 tablet (40 mg total) by mouth daily. Take on an empty stomach 1hr before or 2 hrs after meals. 30 tablet 0  . ALPRAZolam (XANAX) 1 MG tablet     . ALTABAX 1 % ointment     . calcium-vitamin D (OSCAL) 250-125 MG-UNIT per tablet Take 1 tablet by mouth daily.    . diazepam (VALIUM) 10 MG tablet TAKE 1 TABLET BY MOUTH 30 MINUTES PRIOR TO PROCEDURE  0  . esomeprazole (NEXIUM) 20 MG capsule Take by mouth 2 (two) times daily before a meal.     . loperamide (IMODIUM) 2 MG capsule Take 2 mg by mouth as needed for diarrhea or loose stools.    . mirtazapine (REMERON) 30 MG tablet Take 1 tablet (30 mg total) by mouth at bedtime. 30 tablet 2  . mirtazapine (REMERON) 30 MG tablet TAKE 1 TABLET (30 MG TOTAL) BY MOUTH AT  BEDTIME. 30 tablet 2  . naproxen sodium (ANAPROX) 220 MG tablet Take 220 mg by mouth as needed.    . NEOMYCIN-POLYMYXIN-HYDROCORTISONE (CORTISPORIN) 1 % SOLN otic solution Apply 1-2 drops to toe BID after soaking 10 mL 1   No current facility-administered medications for this visit.    SURGICAL HISTORY:  Past Surgical History  Procedure Laterality Date  . Vasectomy    . Video bronchoscopy Bilateral 03/20/2014    Procedure: VIDEO BRONCHOSCOPY WITH FLUORO;  Surgeon: Rigoberto Noel, MD;  Location: [facility];   Service: Cardiopulmonary;  Laterality: Bilateral;    REVIEW OF SYSTEMS:  A comprehensive review of systems was negative except for: Integument/breast: positive for rash   PHYSICAL EXAMINATION: General appearance: alert, cooperative and no distress Head: Normocephalic, without obvious abnormality, atraumatic Neck: no adenopathy, no JVD, supple, symmetrical, trachea midline and thyroid not enlarged, symmetric, no tenderness/mass/nodules Lymph nodes: Cervical, supraclavicular, and axillary nodes normal. Resp: clear to auscultation bilaterally Back: symmetric, no curvature. ROM normal. No CVA tenderness. Cardio: regular rate and rhythm, S1, S2 normal, no murmur, click, rub or gallop GI: soft, non-tender; bowel sounds normal; no masses,  no organomegaly Extremities: extremities normal, atraumatic, no cyanosis or edema Neurologic: Alert and oriented X 3, normal strength and tone. Normal symmetric reflexes. Normal coordination and gait  ECOG PERFORMANCE STATUS: 1 - Symptomatic but completely ambulatory  Blood pressure 123/77, pulse 83, temperature 98.4 F (36.9 C), temperature source Oral, resp. rate 18, height '6\' 2"'$  (1.88 m), weight 208 lb (94.348 kg), SpO2 99 %.  LABORATORY DATA: Lab Results  Component Value Date   WBC 5.5 11/06/2015   HGB 13.7 11/06/2015   HCT 41.1 11/06/2015   MCV 87.4 11/06/2015   PLT 204 11/06/2015      Chemistry      Component Value Date/Time   NA 141 10/10/2015 0812   K 4.1 10/10/2015 0812   CO2 25 10/10/2015 0812   BUN 7.3 10/10/2015 0812   CREATININE 1.1 10/10/2015 0812      Component Value Date/Time   CALCIUM 8.7 10/10/2015 0812   ALKPHOS 159* 10/10/2015 0812   AST 30 10/10/2015 0812   ALT 29 10/10/2015 0812   BILITOT 0.84 10/10/2015 0812       RADIOGRAPHIC STUDIES: No results found. ASSESSMENT AND PLAN: This is a very pleasant 50 years old white male with [name], adenocarcinoma with positive EGFR mutation in exon  19 currently undergoing treatment with oral Gilotrif 40 mg by mouth daily status post 18 months and tolerating his treatment fairly well except for the grade 1 skin rash and mild paronychia of the toes as well as mild diarrhea once daily. He takes Imodium at regular basis usually in the morning. Her CBC today is unremarkable. Comprehensive metabolic panel is still pending. I recommended for him to continue his current treatment with Gilotrif at the same dose. For the metastatic bone disease, the patient will continue with monthly Xgeva. He will also continue on calcium and vitamin D supplement. For the weight loss and depression, he is currently on Remeron 30 mg by mouth each bedtime. He would come back for followup visit in around 2 months with repeat CBC and comprehensive metabolic panel in addition to CT scan of the chest, abdomen and pelvis for restaging of his disease. He was advised to call immediately if he has any concerning symptoms in the interval. The patient voices understanding of current disease status and treatment options and is in agreement  with the current care plan.  All questions were answered. The patient knows to call the clinic with any problems, questions or concerns. We can certainly see the patient much sooner if necessary.  Disclaimer: This note was dictated with voice recognition software. Similar sounding words can inadvertently be transcribed and may not be corrected upon review.       

## 2015-11-07 ENCOUNTER — Telehealth: Payer: Self-pay | Admitting: Radiation Oncology

## 2015-11-07 NOTE — Telephone Encounter (Signed)
-----   Message from Tyler Pita, MD sent at 11/06/2015 11:55 AM EST ----- Regarding: RE: pt needs Valium for MRI scheduled on 3/9 Sam, I printed refil for 15 tabs.  Please call in. MM   ----- Message -----    From: Pincus Large    Sent: 11/06/2015  10:54 AM      To: Tyler Pita, MD, # Subject: pt needs Valium for MRI scheduled on 3/9       Oscar Floyd is scheduled for a brain MRI on 3/9. He has been given Valium, 10 mg, in the past and is requesting a refill. [facility] [address]  Thanks, Manuela Schwartz

## 2015-11-07 NOTE — Telephone Encounter (Signed)
Per Dr. Johny Shears order called in Valium 10 mg, one tablet by mouth as directed or 30 minutes prior to MRI, qty 15, and [facility] [address]. Phoned patient to make him aware this was done. No answer. Left message.

## 2015-11-20 ENCOUNTER — Ambulatory Visit
Admission: RE | Admit: 2015-11-20 | Discharge: 2015-11-20 | Disposition: A | Discharge status: Home / Self Care | Payer: BLUE CROSS/BLUE SHIELD | Source: Ambulatory Visit | Attending: Radiation Oncology | Admitting: Radiation Oncology

## 2015-11-20 DIAGNOSIS — C7931 Secondary malignant neoplasm of brain: Secondary | ICD-10-CM

## 2015-11-20 MED ORDER — GADOBENATE DIMEGLUMINE 529 MG/ML IV SOLN
19.0000 mL | Freq: Once | INTRAVENOUS | Status: AC | PRN
  Administered 2015-11-20: 19 mL via INTRAVENOUS

## 2015-11-20 NOTE — Progress Notes (Signed)
Quick Note:  Please call patient with normal result.  Thanks. MM ______ 

## 2015-11-21 ENCOUNTER — Telehealth: Payer: Self-pay | Admitting: Radiation Oncology

## 2015-11-21 NOTE — Telephone Encounter (Addendum)
LM for pt's wife to have her husband call us for MRI results review.  The patient called back to review MRI results. Stable changes in the treatment area noted. Dr. Tammi Klippel has recommended repeat imaging with MRI in 2 months with subsequent follow up. I will notify our Brain Navigator Manuela Schwartz as well.

## 2015-11-21 NOTE — Telephone Encounter (Signed)
Phoned patient with normal MRI results per Dr. Johny Shears order. Patient without complaints and agrees to repeat MRI in 2 months then follow up. Understands Mont Dutton, navigator, will be in touch with arrangements. Patient verbalized appreciation for the call.

## 2015-11-24 ENCOUNTER — Ambulatory Visit: Payer: BLUE CROSS/BLUE SHIELD | Admitting: Radiation Oncology

## 2015-11-25 ENCOUNTER — Telehealth: Payer: Self-pay | Admitting: Medical Oncology

## 2015-11-25 DIAGNOSIS — C7951 Secondary malignant neoplasm of bone: Secondary | ICD-10-CM

## 2015-11-25 DIAGNOSIS — C3490 Malignant neoplasm of unspecified part of unspecified bronchus or lung: Secondary | ICD-10-CM

## 2015-11-25 DIAGNOSIS — C3491 Malignant neoplasm of unspecified part of right bronchus or lung: Secondary | ICD-10-CM

## 2015-11-25 DIAGNOSIS — R918 Other nonspecific abnormal finding of lung field: Secondary | ICD-10-CM

## 2015-11-25 DIAGNOSIS — C7931 Secondary malignant neoplasm of brain: Secondary | ICD-10-CM

## 2015-11-25 DIAGNOSIS — C7949 Secondary malignant neoplasm of other parts of nervous system: Secondary | ICD-10-CM

## 2015-11-25 MED ORDER — AFATINIB DIMALEATE 40 MG PO TABS: 40.0000 mg | ORAL_TABLET | Freq: Every day | ORAL | Status: DC

## 2015-11-25 NOTE — Telephone Encounter (Signed)
Refill done x2

## 2015-11-30 ENCOUNTER — Other Ambulatory Visit: Payer: Self-pay | Admitting: Internal Medicine

## 2015-12-03 ENCOUNTER — Other Ambulatory Visit: Payer: Self-pay | Admitting: Medical Oncology

## 2015-12-03 DIAGNOSIS — C3491 Malignant neoplasm of unspecified part of right bronchus or lung: Secondary | ICD-10-CM

## 2015-12-04 ENCOUNTER — Ambulatory Visit (HOSPITAL_BASED_OUTPATIENT_CLINIC_OR_DEPARTMENT_OTHER): Payer: BLUE CROSS/BLUE SHIELD

## 2015-12-04 ENCOUNTER — Other Ambulatory Visit (HOSPITAL_BASED_OUTPATIENT_CLINIC_OR_DEPARTMENT_OTHER): Payer: BLUE CROSS/BLUE SHIELD

## 2015-12-04 VITALS — BP 118/78 | HR 66 | Temp 98.1°F

## 2015-12-04 DIAGNOSIS — C7951 Secondary malignant neoplasm of bone: Secondary | ICD-10-CM

## 2015-12-04 DIAGNOSIS — C3400 Malignant neoplasm of unspecified main bronchus: Secondary | ICD-10-CM

## 2015-12-04 DIAGNOSIS — C3491 Malignant neoplasm of unspecified part of right bronchus or lung: Secondary | ICD-10-CM

## 2015-12-04 LAB — CBC WITH DIFFERENTIAL/PLATELET
BASO%: 0.7 % (ref 0.0–2.0)
Basophils Absolute: 0 10*3/uL (ref 0.0–0.1)
EOS ABS: 0.4 10*3/uL (ref 0.0–0.5)
EOS%: 6 % (ref 0.0–7.0)
HEMATOCRIT: 39.9 % (ref 38.4–49.9)
HEMOGLOBIN: 13.1 g/dL (ref 13.0–17.1)
LYMPH#: 1.4 10*3/uL (ref 0.9–3.3)
LYMPH%: 21.9 % (ref 14.0–49.0)
MCH: 28.7 pg (ref 27.2–33.4)
MCHC: 32.8 g/dL (ref 32.0–36.0)
MCV: 87.4 fL (ref 79.3–98.0)
MONO#: 0.5 10*3/uL (ref 0.1–0.9)
MONO%: 7.4 % (ref 0.0–14.0)
NEUT%: 64 % (ref 39.0–75.0)
NEUTROS ABS: 4.2 10*3/uL (ref 1.5–6.5)
PLATELETS: 235 10*3/uL (ref 140–400)
RBC: 4.56 10*6/uL (ref 4.20–5.82)
RDW: 13.5 % (ref 11.0–14.6)
WBC: 6.5 10*3/uL (ref 4.0–10.3)

## 2015-12-04 LAB — COMPREHENSIVE METABOLIC PANEL
ALBUMIN: 3.5 g/dL (ref 3.5–5.0)
ALT: 30 U/L (ref 0–55)
ANION GAP: 6 meq/L (ref 3–11)
AST: 30 U/L (ref 5–34)
Alkaline Phosphatase: 190 U/L — ABNORMAL HIGH (ref 40–150)
BUN: 9.4 mg/dL (ref 7.0–26.0)
CALCIUM: 8.9 mg/dL (ref 8.4–10.4)
CO2: 28 mEq/L (ref 22–29)
Chloride: 109 mEq/L (ref 98–109)
Creatinine: 1.1 mg/dL (ref 0.7–1.3)
EGFR: 80 mL/min/{1.73_m2} — AB (ref >90)
Glucose: 80 mg/dl (ref 70–140)
POTASSIUM: 4.2 meq/L (ref 3.5–5.1)
SODIUM: 143 meq/L (ref 136–145)
TOTAL PROTEIN: 6.7 g/dL (ref 6.4–8.3)
Total Bilirubin: 0.46 mg/dL (ref 0.20–1.20)

## 2015-12-04 MED ORDER — DENOSUMAB 120 MG/1.7ML ~~LOC~~ SOLN
120.0000 mg | Freq: Once | SUBCUTANEOUS | Status: AC
  Administered 2015-12-04: 120 mg via SUBCUTANEOUS
  Filled 2015-12-04: qty 1.7

## 2016-01-01 ENCOUNTER — Other Ambulatory Visit (HOSPITAL_BASED_OUTPATIENT_CLINIC_OR_DEPARTMENT_OTHER): Payer: BLUE CROSS/BLUE SHIELD

## 2016-01-01 ENCOUNTER — Ambulatory Visit (HOSPITAL_BASED_OUTPATIENT_CLINIC_OR_DEPARTMENT_OTHER): Payer: BLUE CROSS/BLUE SHIELD

## 2016-01-01 ENCOUNTER — Ambulatory Visit (INDEPENDENT_AMBULATORY_CARE_PROVIDER_SITE_OTHER): Payer: BLUE CROSS/BLUE SHIELD | Admitting: Podiatry

## 2016-01-01 ENCOUNTER — Encounter: Payer: Self-pay | Admitting: Podiatry

## 2016-01-01 VITALS — BP 126/87 | HR 73 | Temp 98.6°F

## 2016-01-01 VITALS — BP 142/96 | HR 73 | Resp 16

## 2016-01-01 DIAGNOSIS — C3400 Malignant neoplasm of unspecified main bronchus: Secondary | ICD-10-CM | POA: Diagnosis not present

## 2016-01-01 DIAGNOSIS — C7931 Secondary malignant neoplasm of brain: Secondary | ICD-10-CM

## 2016-01-01 DIAGNOSIS — L600 Ingrowing nail: Secondary | ICD-10-CM

## 2016-01-01 DIAGNOSIS — C7951 Secondary malignant neoplasm of bone: Secondary | ICD-10-CM

## 2016-01-01 DIAGNOSIS — C7949 Secondary malignant neoplasm of other parts of nervous system: Secondary | ICD-10-CM

## 2016-01-01 DIAGNOSIS — C3491 Malignant neoplasm of unspecified part of right bronchus or lung: Secondary | ICD-10-CM

## 2016-01-01 LAB — CBC WITH DIFFERENTIAL/PLATELET
BASO%: 0.7 % (ref 0.0–2.0)
BASOS ABS: 0 10*3/uL (ref 0.0–0.1)
EOS ABS: 0.3 10*3/uL (ref 0.0–0.5)
EOS%: 5.1 % (ref 0.0–7.0)
HCT: 40.2 % (ref 38.4–49.9)
HGB: 13.5 g/dL (ref 13.0–17.1)
LYMPH%: 25.4 % (ref 14.0–49.0)
MCH: 28.9 pg (ref 27.2–33.4)
MCHC: 33.5 g/dL (ref 32.0–36.0)
MCV: 86.3 fL (ref 79.3–98.0)
MONO#: 0.5 10*3/uL (ref 0.1–0.9)
MONO%: 8.6 % (ref 0.0–14.0)
NEUT#: 3.3 10*3/uL (ref 1.5–6.5)
NEUT%: 60.2 % (ref 39.0–75.0)
PLATELETS: 203 10*3/uL (ref 140–400)
RBC: 4.66 10*6/uL (ref 4.20–5.82)
RDW: 13.8 % (ref 11.0–14.6)
WBC: 5.4 10*3/uL (ref 4.0–10.3)
lymph#: 1.4 10*3/uL (ref 0.9–3.3)

## 2016-01-01 LAB — COMPREHENSIVE METABOLIC PANEL
ALT: 30 U/L (ref 0–55)
ANION GAP: 8 meq/L (ref 3–11)
AST: 35 U/L — ABNORMAL HIGH (ref 5–34)
Albumin: 3.4 g/dL — ABNORMAL LOW (ref 3.5–5.0)
Alkaline Phosphatase: 239 U/L — ABNORMAL HIGH (ref 40–150)
BILIRUBIN TOTAL: 0.45 mg/dL (ref 0.20–1.20)
BUN: 13.2 mg/dL (ref 7.0–26.0)
CHLORIDE: 109 meq/L (ref 98–109)
CO2: 24 meq/L (ref 22–29)
Calcium: 9.2 mg/dL (ref 8.4–10.4)
Creatinine: 1.1 mg/dL (ref 0.7–1.3)
EGFR: 83 mL/min/{1.73_m2} — AB (ref >90)
Glucose: 94 mg/dl (ref 70–140)
Potassium: 4.2 mEq/L (ref 3.5–5.1)
Sodium: 141 mEq/L (ref 136–145)
Total Protein: 6.6 g/dL (ref 6.4–8.3)

## 2016-01-01 MED ORDER — PALONOSETRON HCL INJECTION 0.25 MG/5ML
INTRAVENOUS | Status: AC
  Filled 2016-01-01: qty 5

## 2016-01-01 MED ORDER — NEOMYCIN-POLYMYXIN-HC 3.5-10000-1 OT SOLN: OTIC | Status: DC

## 2016-01-01 MED ORDER — DENOSUMAB 120 MG/1.7ML ~~LOC~~ SOLN
120.0000 mg | Freq: Once | SUBCUTANEOUS | Status: AC
  Administered 2016-01-01: 120 mg via SUBCUTANEOUS
  Filled 2016-01-01: qty 1.7

## 2016-01-01 NOTE — Patient Instructions (Signed)

## 2016-01-03 NOTE — Progress Notes (Signed)
He presents today with a chief complaint of another ingrown toenail to the tibial and fibular border of the hallux left. He states that they seem to be getting worse once again. He is a cancer patient and the medication that he takes causes ingrown toenails. He denies fever chills nausea vomiting muscle aches and pains.  Objective: Vital signs are stable alert and oriented 3 sharp incurvated nail margins only along the tibial and fibular border of the hallux left. Erythema and edema is present no purulence no malodor.  Assessment: Pain in limb secondary to ingrown toenails hallux tibial and fibular border left foot. This is secondary to chemotherapeutic agent he is currently taking.  Plan: Chemical destruction/matrixectomy's left hallux both borders. He tolerated this procedure well after local anesthesia was administered he was given both oral and written home-going instructions for care and soaking of his toe as well as a prescription for Cortisporin Otic which will be applied after soaking twice daily. I'll follow-up with him in a week or so to make sure that he is doing well.

## 2016-01-05 ENCOUNTER — Ambulatory Visit (HOSPITAL_COMMUNITY)
Admission: RE | Admit: 2016-01-05 | Discharge: 2016-01-05 | Disposition: A | Discharge status: Home / Self Care | Payer: BLUE CROSS/BLUE SHIELD | Source: Ambulatory Visit | Attending: Internal Medicine | Admitting: Internal Medicine

## 2016-01-05 ENCOUNTER — Encounter (HOSPITAL_COMMUNITY): Payer: Self-pay

## 2016-01-05 DIAGNOSIS — C7951 Secondary malignant neoplasm of bone: Secondary | ICD-10-CM | POA: Diagnosis not present

## 2016-01-05 DIAGNOSIS — K769 Liver disease, unspecified: Secondary | ICD-10-CM | POA: Insufficient documentation

## 2016-01-05 DIAGNOSIS — C7949 Secondary malignant neoplasm of other parts of nervous system: Secondary | ICD-10-CM | POA: Diagnosis present

## 2016-01-05 DIAGNOSIS — C7931 Secondary malignant neoplasm of brain: Secondary | ICD-10-CM

## 2016-01-05 DIAGNOSIS — C3491 Malignant neoplasm of unspecified part of right bronchus or lung: Secondary | ICD-10-CM | POA: Insufficient documentation

## 2016-01-05 DIAGNOSIS — C7800 Secondary malignant neoplasm of unspecified lung: Secondary | ICD-10-CM | POA: Insufficient documentation

## 2016-01-05 DIAGNOSIS — R918 Other nonspecific abnormal finding of lung field: Secondary | ICD-10-CM | POA: Insufficient documentation

## 2016-01-05 MED ORDER — IOPAMIDOL (ISOVUE-300) INJECTION 61%
100.0000 mL | Freq: Once | INTRAVENOUS | Status: AC | PRN
  Administered 2016-01-05: 100 mL via INTRAVENOUS

## 2016-01-05 MED ORDER — DIATRIZOATE MEGLUMINE & SODIUM 66-10 % PO SOLN
30.0000 mL | Freq: Once | ORAL | Status: AC
  Administered 2016-01-05: 30 mL via ORAL

## 2016-01-06 ENCOUNTER — Encounter: Payer: Self-pay | Admitting: *Deleted

## 2016-01-06 ENCOUNTER — Encounter: Payer: Self-pay | Admitting: Internal Medicine

## 2016-01-06 ENCOUNTER — Ambulatory Visit (HOSPITAL_BASED_OUTPATIENT_CLINIC_OR_DEPARTMENT_OTHER): Payer: BLUE CROSS/BLUE SHIELD

## 2016-01-06 ENCOUNTER — Telehealth: Payer: Self-pay | Admitting: Internal Medicine

## 2016-01-06 ENCOUNTER — Ambulatory Visit (HOSPITAL_BASED_OUTPATIENT_CLINIC_OR_DEPARTMENT_OTHER): Payer: BLUE CROSS/BLUE SHIELD | Admitting: Internal Medicine

## 2016-01-06 VITALS — BP 119/83 | HR 88 | Temp 98.2°F | Resp 17 | Ht 74.0 in | Wt 210.5 lb

## 2016-01-06 DIAGNOSIS — C7931 Secondary malignant neoplasm of brain: Secondary | ICD-10-CM | POA: Diagnosis not present

## 2016-01-06 DIAGNOSIS — C3491 Malignant neoplasm of unspecified part of right bronchus or lung: Secondary | ICD-10-CM

## 2016-01-06 DIAGNOSIS — L03019 Cellulitis of unspecified finger: Secondary | ICD-10-CM

## 2016-01-06 DIAGNOSIS — C3411 Malignant neoplasm of upper lobe, right bronchus or lung: Secondary | ICD-10-CM

## 2016-01-06 DIAGNOSIS — C787 Secondary malignant neoplasm of liver and intrahepatic bile duct: Secondary | ICD-10-CM | POA: Diagnosis not present

## 2016-01-06 DIAGNOSIS — L03039 Cellulitis of unspecified toe: Secondary | ICD-10-CM

## 2016-01-06 DIAGNOSIS — C7951 Secondary malignant neoplasm of bone: Secondary | ICD-10-CM | POA: Diagnosis not present

## 2016-01-06 DIAGNOSIS — Z5111 Encounter for antineoplastic chemotherapy: Secondary | ICD-10-CM

## 2016-01-06 DIAGNOSIS — F329 Major depressive disorder, single episode, unspecified: Secondary | ICD-10-CM

## 2016-01-06 DIAGNOSIS — L270 Generalized skin eruption due to drugs and medicaments taken internally: Secondary | ICD-10-CM

## 2016-01-06 DIAGNOSIS — R634 Abnormal weight loss: Secondary | ICD-10-CM

## 2016-01-06 HISTORY — DX: Encounter for antineoplastic chemotherapy: Z51.11

## 2016-01-06 NOTE — Telephone Encounter (Signed)
per po fto sch pt appt-gave pt copy of avs-sent back to lab °

## 2016-01-06 NOTE — Progress Notes (Signed)
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  Primary site: Lung (Right)  Staging method: AJCC 7th Edition  Clinical: Stage IV (T2a, N0, M1b) signed by [name], MD on 03/26/2014 4:57 PM  Summary: Stage IV (T2a, N0, M1b)   PRIOR THERAPY: Status post whole brain irradiation under the care Dr. Tammi Klippel completed 04/12/2014   CURRENT THERAPY:  1) Gilotrif 40 mg po daily - therapy beginning 04/03/2014. Status post approximately 20 months of therapy  2) Xgeva 120 mcg subcutaneously on monthly basis.   DISEASE STAGE:  Lung cancer  Primary site: Lung (Right)  Staging method: AJCC 7th Edition  Clinical: Stage IV (T2a, N0, M1b) signed by [name], MD on 03/26/2014 4:57 PM  Summary: Stage IV (T2a, N0, M1b)  CHEMOTHERAPY INTENT: palliative  CURRENT # OF CHEMOTHERAPY CYCLES: 21 CURRENT ANTIEMETICS: none  CURRENT SMOKING STATUS: non-smoker/never smoker  ORAL CHEMOTHERAPY AND CONSENT: yes- Gilotrif  CURRENT BISPHOSPHONATES USE: none  PAIN MANAGEMENT: none  NARCOTICS INDUCED CONSTIPATION: none  LIVING WILL AND CODE STATUS:   INTERVAL HISTORY: Oscar Floyd 50 y.o. male returns to the clinic today for 2 month followup visit. The patient is feeling fine today with no specific complaints. He is tolerating his treatment with Gilotrif fairly well with no significant adverse effects except for the persistent mild skin rash on the nose and upper chest and back. He also continues to have paronychia of the big toes. The patient continues to work full-time and plays golf at  regular basis. The patient denied having any significant chest pain, shortness of breath, cough or hemoptysis. He has no fever or chills, no nausea or vomiting. He had repeat CT scan of the chest, abdomen and pelvis performed recently and he is here for evaluation and discussion of his scan results.  MEDICAL HISTORY: Past Medical History  Diagnosis Date  . Hypertension   . Lung cancer ([place])     RUL lung with mets to liver, bone and brain  . Bone metastases ([place]) 08/26/2015    ALLERGIES:  has No Known Allergies.  MEDICATIONS:  Current Outpatient Prescriptions  Medication Sig Dispense Refill  . afatinib dimaleate (GILOTRIF) 40 MG tablet Take 1 tablet (40 mg total) by mouth daily. Take on an empty stomach 1hr before or 2 hrs after meals. 30 tablet 2  . ALPRAZolam (XANAX) 1 MG tablet     . ALTABAX 1 % ointment     . calcium-vitamin D (OSCAL) 250-125 MG-UNIT per tablet Take 1 tablet by mouth daily.    . diazepam (VALIUM) 10 MG tablet Take 1 tablet (10 mg total) by mouth as directed. 30 min before MRI 15 tablet 0  . esomeprazole (NEXIUM) 20 MG capsule Take by mouth 2 (two) times daily before a meal.     . loperamide (IMODIUM) 2 MG capsule Take 2 mg by mouth as needed for diarrhea or loose stools.    . mirtazapine (REMERON) 30 MG tablet Take 1 tablet (30 mg total) by mouth at bedtime. 30 tablet 2  . mirtazapine (REMERON) 30 MG tablet TAKE 1 TABLET (30 MG  TOTAL) BY MOUTH AT BEDTIME. 30 tablet 2  . naproxen sodium (ANAPROX) 220 MG tablet Take 220 mg by mouth as needed.    . NEOMYCIN-POLYMYXIN-HYDROCORTISONE (CORTISPORIN) 1 % SOLN otic solution Apply 1-2 drops to toe BID after soaking 10 mL 1  . neomycin-polymyxin-hydrocortisone (CORTISPORIN) otic solution Apply one to two drops to toe after soaking twice daily. 10 mL 0   No current facility-administered medications for this visit.    SURGICAL HISTORY:  Past Surgical History  Procedure Laterality Date  . Vasectomy    . Video bronchoscopy  Bilateral 03/20/2014    Procedure: VIDEO BRONCHOSCOPY WITH FLUORO;  Surgeon: Rigoberto Noel, MD;  Location: [facility];  Service: Cardiopulmonary;  Laterality: Bilateral;    REVIEW OF SYSTEMS:  Constitutional: negative Eyes: negative Ears, nose, mouth, throat, and face: negative Respiratory: negative Cardiovascular: negative Gastrointestinal: negative Genitourinary:negative Integument/breast: negative Hematologic/lymphatic: negative Musculoskeletal:negative Neurological: negative Behavioral/Psych: negative Endocrine: negative Allergic/Immunologic: negative   PHYSICAL EXAMINATION: General appearance: alert, cooperative and no distress Head: Normocephalic, without obvious abnormality, atraumatic Neck: no adenopathy, no JVD, supple, symmetrical, trachea midline and thyroid not enlarged, symmetric, no tenderness/mass/nodules Lymph nodes: Cervical, supraclavicular, and axillary nodes normal. Resp: clear to auscultation bilaterally Back: symmetric, no curvature. ROM normal. No CVA tenderness. Cardio: regular rate and rhythm, S1, S2 normal, no murmur, click, rub or gallop GI: soft, non-tender; bowel sounds normal; no masses,  no organomegaly Extremities: extremities normal, atraumatic, no cyanosis or edema Neurologic: Alert and oriented X 3, normal strength and tone. Normal symmetric reflexes. Normal coordination and gait  ECOG PERFORMANCE STATUS: 1 - Symptomatic but completely ambulatory  Blood pressure 119/83, pulse 88, temperature 98.2 F (36.8 C), temperature source Oral, resp. rate 17, height _0  (1.88 m), weight 210 lb 8 oz (95.482 kg), SpO2 100 %.  LABORATORY DATA: Lab Results  Component Value Date   WBC 5.4 01/01/2016   HGB 13.5 01/01/2016   HCT 40.2 01/01/2016   MCV 86.3 01/01/2016   PLT 203 01/01/2016      Chemistry      Component Value Date/Time   NA 141 01/01/2016 0747   K 4.2 01/01/2016 0747   CO2 24 01/01/2016 0747   BUN 13.2 01/01/2016 0747   CREATININE  1.1 01/01/2016 0747      Component Value Date/Time   CALCIUM 9.2 01/01/2016 0747   ALKPHOS 239* 01/01/2016 0747   AST 35* 01/01/2016 0747   ALT 30 01/01/2016 0747   BILITOT 0.45 01/01/2016 0747       RADIOGRAPHIC STUDIES: Ct Chest W Contrast  01/05/2016  CLINICAL DATA:  50 year old male with history of metastatic lung cancer to the liver, bones and brain. Originally diagnosed in July 2015. Radiation therapy complete. Oral chemotherapy in progress. EXAM: CT CHEST, ABDOMEN, AND PELVIS WITH CONTRAST TECHNIQUE: Multidetector CT imaging of the chest, abdomen and pelvis was performed following the standard protocol during bolus administration of intravenous contrast. CONTRAST:  134m ISOVUE-300 IOPAMIDOL (ISOVUE-300) INJECTION 61% COMPARISON:  CT of the chest, abdomen and pelvis 08/22/2015. FINDINGS: CT CHEST FINDINGS Mediastinum/Lymph Nodes: Heart size is normal. There is no significant pericardial fluid, thickening or pericardial calcification. No pathologically enlarged mediastinal or hilar lymph nodes. Esophagus is unremarkable in appearance. No axillary lymphadenopathy. Lungs/Pleura: The previously noted pulmonary nodule in the inferior aspect of the right upper lobe (image 82 of series 4) is roughly stable in size measuring 16 x 12 mm (mean diameter of 14 mm) on today's study. However, there are now innumerable new pulmonary nodules scattered  throughout all aspects of the lungs bilaterally, compatible with widespread metastatic disease to the lungs, the largest of which is a 2.1 x 1.6 x 1.2 cm nodule in the inferior aspect of the lingula (image 106 of series 4 and sagittal image 136 of series 603). In addition, there are areas of interlobular septal thickening and nodularity which are most prevalent throughout the visualize lung bases, concerning for developing lymphangitic spread of disease. No confluent consolidative airspace disease. No pleural effusions. Musculoskeletal/Soft Tissues: Diffuse  predominantly sclerotic lesions are again noted throughout the visualized axial and appendicular skeleton, compatible with widespread metastatic disease to the bones. This overall appears very similar to the prior examination. CT ABDOMEN AND PELVIS FINDINGS Hepatobiliary: There are several new sub cm hypovascular liver lesions which are incompletely characterized on today's examination due to their small size, but are new compared to prior examination 08/22/2015, concerning for metastatic disease. The largest of these lesions is in segment 4A (image 53 of series 2) measuring 7 mm. Pancreas: No pancreatic mass. No pancreatic ductal dilatation. No pancreatic or peripancreatic fluid or inflammatory changes. Spleen: Unremarkable. Adrenals/Urinary Tract: Sub cm low-attenuation lesion in the interpolar region of the right kidney is too small to definitively characterize, but is statistically likely a tiny cyst. Bilateral adrenal glands and the right kidney are normal in appearance. No hydroureteronephrosis. Urinary bladder is normal in appearance. Stomach/Bowel: The appearance of the stomach is normal. There is no pathologic dilatation of small bowel or colon. Normal appendix. Vascular/Lymphatic: No significant atherosclerotic disease, aneurysm or dissection identified in the abdominal or pelvic vasculature. No lymphadenopathy noted in the abdomen or pelvis. Reproductive: Prostate gland and seminal vesicles are unremarkable in appearance. Other: No significant volume of ascites.  No pneumoperitoneum. Musculoskeletal: Widespread predominantly sclerotic lesions throughout all aspects of the visualized osseous and appendicular skeleton, similar to the prior examination, compatible with widespread metastatic disease to the bones. IMPRESSION: 1. Today's study demonstrates progression of disease. Specifically, while the previously described right upper lobe nodule is essentially stable in size, there are innumerable new and  enlarging pulmonary nodules throughout the lungs bilaterally, compatible with widespread metastatic disease to the lungs, and there is evidence of developing lymphangitic spread of disease throughout the lung bases. In addition, there are several new hypovascular liver lesions which are too small to characterize, but are certainly suspicious for metastatic disease to the liver on today's examination. Widespread metastatic disease to the bones appears essentially unchanged compared to the prior examination. 2. Additional incidental findings, as above. Electronically Signed   By: Vinnie Langton M.D.   On: 01/05/2016 11:25   Ct Abdomen Pelvis W Contrast  01/05/2016  CLINICAL DATA:  50 year old male with history of metastatic lung cancer to the liver, bones and brain. Originally diagnosed in July 2015. Radiation therapy complete. Oral chemotherapy in progress. EXAM: CT CHEST, ABDOMEN, AND PELVIS WITH CONTRAST TECHNIQUE: Multidetector CT imaging of the chest, abdomen and pelvis was performed following the standard protocol during bolus administration of intravenous contrast. CONTRAST:  172m ISOVUE-300 IOPAMIDOL (ISOVUE-300) INJECTION 61% COMPARISON:  CT of the chest, abdomen and pelvis 08/22/2015. FINDINGS: CT CHEST FINDINGS Mediastinum/Lymph Nodes: Heart size is normal. There is no significant pericardial fluid, thickening or pericardial calcification. No pathologically enlarged mediastinal or hilar lymph nodes. Esophagus is unremarkable in appearance. No axillary lymphadenopathy. Lungs/Pleura: The previously noted pulmonary nodule in the inferior aspect of the right upper lobe (image 82 of series 4) is roughly stable in size measuring 16 x 12 mm (mean diameter  of 14 mm) on today's study. However, there are now innumerable new pulmonary nodules scattered throughout all aspects of the lungs bilaterally, compatible with widespread metastatic disease to the lungs, the largest of which is a 2.1 x 1.6 x 1.2 cm nodule  in the inferior aspect of the lingula (image 106 of series 4 and sagittal image 136 of series 603). In addition, there are areas of interlobular septal thickening and nodularity which are most prevalent throughout the visualize lung bases, concerning for developing lymphangitic spread of disease. No confluent consolidative airspace disease. No pleural effusions. Musculoskeletal/Soft Tissues: Diffuse predominantly sclerotic lesions are again noted throughout the visualized axial and appendicular skeleton, compatible with widespread metastatic disease to the bones. This overall appears very similar to the prior examination. CT ABDOMEN AND PELVIS FINDINGS Hepatobiliary: There are several new sub cm hypovascular liver lesions which are incompletely characterized on today's examination due to their small size, but are new compared to prior examination 08/22/2015, concerning for metastatic disease. The largest of these lesions is in segment 4A (image 53 of series 2) measuring 7 mm. Pancreas: No pancreatic mass. No pancreatic ductal dilatation. No pancreatic or peripancreatic fluid or inflammatory changes. Spleen: Unremarkable. Adrenals/Urinary Tract: Sub cm low-attenuation lesion in the interpolar region of the right kidney is too small to definitively characterize, but is statistically likely a tiny cyst. Bilateral adrenal glands and the right kidney are normal in appearance. No hydroureteronephrosis. Urinary bladder is normal in appearance. Stomach/Bowel: The appearance of the stomach is normal. There is no pathologic dilatation of small bowel or colon. Normal appendix. Vascular/Lymphatic: No significant atherosclerotic disease, aneurysm or dissection identified in the abdominal or pelvic vasculature. No lymphadenopathy noted in the abdomen or pelvis. Reproductive: Prostate gland and seminal vesicles are unremarkable in appearance. Other: No significant volume of ascites.  No pneumoperitoneum. Musculoskeletal:  Widespread predominantly sclerotic lesions throughout all aspects of the visualized osseous and appendicular skeleton, similar to the prior examination, compatible with widespread metastatic disease to the bones. IMPRESSION: 1. Today's study demonstrates progression of disease. Specifically, while the previously described right upper lobe nodule is essentially stable in size, there are innumerable new and enlarging pulmonary nodules throughout the lungs bilaterally, compatible with widespread metastatic disease to the lungs, and there is evidence of developing lymphangitic spread of disease throughout the lung bases. In addition, there are several new hypovascular liver lesions which are too small to characterize, but are certainly suspicious for metastatic disease to the liver on today's examination. Widespread metastatic disease to the bones appears essentially unchanged compared to the prior examination. 2. Additional incidental findings, as above. Electronically Signed   By: Vinnie Langton M.D.   On: 01/05/2016 11:25   ASSESSMENT AND PLAN: This is a very pleasant 50 years old white male with [name], adenocarcinoma with positive EGFR mutation in exon 19 currently undergoing treatment with oral Gilotrif 40 mg by mouth daily status post 18 months and tolerating his treatment fairly well except for the grade 1 skin rash and mild paronychia of the toes as well as mild diarrhea once daily. He takes Imodium at regular basis usually in the morning. Unfortunately the recent CT scan of the chest, abdomen and pelvis showed evidence for disease progression with innumerable new and enlarging pulmonary nodules throughout the lungs bilaterally as well as new liver lesions. I discussed the scan results and showed the images to the patient today. I recommended for him to have blood test to rule out EGFR resistant  mutation, T790M. I will see him back for follow-up visit in 2 weeks for  evaluation and discussion of treatment options based on the mutation is status. If the blood test is negative for T790M mutation, I may consider biopsy of one of the enlarging liver lesion. I recommended for him to continue his current treatment with Gilotrif at the same dose for now. For the metastatic bone disease, the patient will continue with monthly Xgeva. He will also continue on calcium and vitamin D supplement. For the weight loss and depression, he is currently on Remeron 30 mg by mouth each bedtime. He would come back for followup visit in around 2 months with repeat CBC and comprehensive metabolic panel in addition to CT scan of the chest, abdomen and pelvis for restaging of his disease. He was advised to call immediately if he has any concerning symptoms in the interval. The patient voices understanding of current disease status and treatment options and is in agreement with the current care plan.  All questions were answered. The patient knows to call the clinic with any problems, questions or concerns. We can certainly see the patient much sooner if necessary.  Disclaimer: This note was dictated with voice recognition software. Similar sounding words can inadvertently be transcribed and may not be corrected upon review.

## 2016-01-06 NOTE — Progress Notes (Signed)
Oncology Nurse Navigator Documentation  Oncology Nurse Navigator Flowsheets 01/06/2016  Navigator Location [facility]  Navigator Encounter Type Clinic/[facility]  Treatment Phase Treatment  Barriers/Navigation Needs Education  Education Other  Interventions Education Method  Education Method Verbal  Acuity Level 2  Time Spent with Patient 15   Spoke with patient today at [facility].  Dr. Julien Nordmann updated him on disease progression.  I updated patient on next steps.

## 2016-01-08 ENCOUNTER — Telehealth: Payer: Self-pay | Admitting: *Deleted

## 2016-01-08 ENCOUNTER — Ambulatory Visit (INDEPENDENT_AMBULATORY_CARE_PROVIDER_SITE_OTHER): Payer: BLUE CROSS/BLUE SHIELD | Admitting: Podiatry

## 2016-01-08 DIAGNOSIS — L600 Ingrowing nail: Secondary | ICD-10-CM

## 2016-01-08 NOTE — Telephone Encounter (Signed)
Oncology Nurse Navigator Documentation  Oncology Nurse Navigator Flowsheets 01/08/2016  Navigator Encounter Type Telephone  Telephone Outgoing Call  Treatment Phase Treatment  Barriers/Navigation Needs Education  Acuity Level 1  Time Spent with Patient 15   I called to check on patient from yesterday's news.  Oscar Floyd has disease progression.  Oscar Floyd and I spoke.  I updated and clarified all questions and concerns. He was thankful for the information

## 2016-01-08 NOTE — Progress Notes (Signed)
He presents today 1 week status post matrixectomy tibial fibular border of the hallux left. He states that it feels much better much improved.  Objective: Vital signs are stable alert and oriented 3. No erythema edema cellulitis drainage or odor to the hallux left. Margins appear to be healing very nicely. No signs of infection.  Assessment: Well-healing matrixectomy hallux left foot.  Plan: Discontinue Betadine start with Epsom salts and warm water soaks continue to cover during the daytime but leave open at night time. Continue to soak until completely resolved. Follow-up with me if necessary.

## 2016-01-12 ENCOUNTER — Encounter: Payer: Self-pay | Admitting: *Deleted

## 2016-01-12 ENCOUNTER — Ambulatory Visit (HOSPITAL_BASED_OUTPATIENT_CLINIC_OR_DEPARTMENT_OTHER): Payer: BLUE CROSS/BLUE SHIELD | Admitting: Nurse Practitioner

## 2016-01-12 ENCOUNTER — Other Ambulatory Visit: Payer: Self-pay | Admitting: Internal Medicine

## 2016-01-12 ENCOUNTER — Telehealth: Payer: Self-pay | Admitting: *Deleted

## 2016-01-12 ENCOUNTER — Other Ambulatory Visit (HOSPITAL_BASED_OUTPATIENT_CLINIC_OR_DEPARTMENT_OTHER): Payer: BLUE CROSS/BLUE SHIELD

## 2016-01-12 ENCOUNTER — Other Ambulatory Visit: Payer: Self-pay | Admitting: *Deleted

## 2016-01-12 VITALS — BP 134/82 | HR 88 | Temp 99.5°F | Resp 20 | Ht 74.0 in | Wt 204.7 lb

## 2016-01-12 DIAGNOSIS — C3491 Malignant neoplasm of unspecified part of right bronchus or lung: Secondary | ICD-10-CM

## 2016-01-12 DIAGNOSIS — C7949 Secondary malignant neoplasm of other parts of nervous system: Secondary | ICD-10-CM

## 2016-01-12 DIAGNOSIS — J029 Acute pharyngitis, unspecified: Secondary | ICD-10-CM

## 2016-01-12 DIAGNOSIS — C7931 Secondary malignant neoplasm of brain: Secondary | ICD-10-CM

## 2016-01-12 LAB — COMPREHENSIVE METABOLIC PANEL
ALBUMIN: 3.5 g/dL (ref 3.5–5.0)
ALK PHOS: 207 U/L — AB (ref 40–150)
ALT: 25 U/L (ref 0–55)
ANION GAP: 7 meq/L (ref 3–11)
AST: 27 U/L (ref 5–34)
BILIRUBIN TOTAL: 1.02 mg/dL (ref 0.20–1.20)
BUN: 8.5 mg/dL (ref 7.0–26.0)
CO2: 28 meq/L (ref 22–29)
Calcium: 8.7 mg/dL (ref 8.4–10.4)
Chloride: 106 mEq/L (ref 98–109)
Creatinine: 1.1 mg/dL (ref 0.7–1.3)
EGFR: 76 mL/min/{1.73_m2} — AB (ref >90)
Glucose: 90 mg/dl (ref 70–140)
POTASSIUM: 3.9 meq/L (ref 3.5–5.1)
Sodium: 141 mEq/L (ref 136–145)
TOTAL PROTEIN: 6.8 g/dL (ref 6.4–8.3)

## 2016-01-12 LAB — CBC WITH DIFFERENTIAL/PLATELET
BASO%: 0.3 % (ref 0.0–2.0)
BASOS ABS: 0 10*3/uL (ref 0.0–0.1)
EOS ABS: 0.2 10*3/uL (ref 0.0–0.5)
EOS%: 2.6 % (ref 0.0–7.0)
HCT: 39.7 % (ref 38.4–49.9)
HGB: 13.4 g/dL (ref 13.0–17.1)
LYMPH%: 14.2 % (ref 14.0–49.0)
MCH: 29.1 pg (ref 27.2–33.4)
MCHC: 33.8 g/dL (ref 32.0–36.0)
MCV: 86.3 fL (ref 79.3–98.0)
MONO#: 0.7 10*3/uL (ref 0.1–0.9)
MONO%: 8.9 % (ref 0.0–14.0)
NEUT%: 74 % (ref 39.0–75.0)
NEUTROS ABS: 5.8 10*3/uL (ref 1.5–6.5)
PLATELETS: 215 10*3/uL (ref 140–400)
RBC: 4.6 10*6/uL (ref 4.20–5.82)
RDW: 13.4 % (ref 11.0–14.6)
WBC: 7.8 10*3/uL (ref 4.0–10.3)
lymph#: 1.1 10*3/uL (ref 0.9–3.3)

## 2016-01-12 LAB — EPIDERMAL GROWTH FACTOR RECEPTOR (EGFR) MUTATION ANALYSIS

## 2016-01-12 LAB — INFLUENZA A AND B
INFLUENZA A AG, EIA: NEGATIVE
INFLUENZA B AG, EIA: NEGATIVE

## 2016-01-12 MED ORDER — OSIMERTINIB MESYLATE 80 MG PO TABS: 80.0000 mg | ORAL_TABLET | Freq: Every day | ORAL | Status: DC

## 2016-01-12 MED ORDER — MAGIC MOUTHWASH W/LIDOCAINE: 5.0000 mL | Freq: Four times a day (QID) | ORAL | Status: DC | PRN

## 2016-01-12 MED ORDER — AMOXICILLIN-POT CLAVULANATE 875-125 MG PO TABS: 1.0000 | ORAL_TABLET | Freq: Two times a day (BID) | ORAL | Status: DC

## 2016-01-12 MED FILL — MAGIC MOUTHWASH W/LIDO 1:1: 12 days supply | Qty: 240 | Fill #0

## 2016-01-12 NOTE — Telephone Encounter (Signed)
Oncology Nurse Navigator Documentation  Oncology Nurse Navigator Flowsheets 01/12/2016  Navigator Encounter Type Telephone  Telephone Outgoing Call  Treatment Phase Treatment  Barriers/Navigation Needs Coordination of Care  Acuity Level 1  Time Spent with Patient 104   Per Dr. Julien Nordmann, I called patient to update him of his appt with Dr. Julien Nordmann on 01/13/16 at 3:15.  Patient verbalized understanding appt time and place.

## 2016-01-12 NOTE — Telephone Encounter (Signed)
Oncology Nurse Navigator Documentation  Oncology Nurse Navigator Flowsheets 01/12/2016  Navigator Encounter Type Telephone  Telephone Outgoing Call  Treatment Phase Treatment  Barriers/Navigation Needs Coordination of Care  Interventions Coordination of Care  Coordination of Care Appts  Acuity Level 2  Time Spent with Patient 30   I received a message from patient's wife.  I called patient today and spoke with him.  He had a fever yesterday but not today.  He states he just doesn't feel well.  I spoke with Dr. Julien Nordmann he stated bring patient in to see symptom management.  Patient stated he would like to be seen.  I updated symptom management team and completed urgent POF.  I updated patient on next steps.

## 2016-01-12 NOTE — Progress Notes (Signed)
Telephone call to pt to notify flu and strep were negative. Pt is to take Augmentin BID for 10 days. Advised pt we would call him with the viral culture. Pt verbalized an understanding.

## 2016-01-12 NOTE — Progress Notes (Signed)
Oncology Nurse Navigator Documentation  Oncology Nurse Navigator Flowsheets 01/12/2016  Navigator Location [facility]  Interventions None required  Acuity Level 1  Time Spent with Patient 15   Spoke with Oscar Floyd today.  He was seen for symptom management.  Dr. Julien Nordmann and Jenny Reichmann updated patient on next steps.  No barriers identified at this time.

## 2016-01-13 ENCOUNTER — Other Ambulatory Visit: Payer: Self-pay

## 2016-01-13 ENCOUNTER — Ambulatory Visit: Payer: BLUE CROSS/BLUE SHIELD | Admitting: Internal Medicine

## 2016-01-13 ENCOUNTER — Encounter: Payer: Self-pay | Admitting: Pharmacist

## 2016-01-13 ENCOUNTER — Encounter: Payer: Self-pay | Admitting: Nurse Practitioner

## 2016-01-13 ENCOUNTER — Ambulatory Visit (HOSPITAL_BASED_OUTPATIENT_CLINIC_OR_DEPARTMENT_OTHER): Payer: BLUE CROSS/BLUE SHIELD | Admitting: Internal Medicine

## 2016-01-13 ENCOUNTER — Encounter: Payer: Self-pay | Admitting: Internal Medicine

## 2016-01-13 ENCOUNTER — Encounter: Payer: Self-pay | Admitting: *Deleted

## 2016-01-13 VITALS — BP 120/86 | HR 90 | Temp 99.1°F | Resp 18 | Ht 74.0 in | Wt 203.2 lb

## 2016-01-13 DIAGNOSIS — J029 Acute pharyngitis, unspecified: Secondary | ICD-10-CM

## 2016-01-13 DIAGNOSIS — C7931 Secondary malignant neoplasm of brain: Secondary | ICD-10-CM | POA: Diagnosis not present

## 2016-01-13 DIAGNOSIS — F329 Major depressive disorder, single episode, unspecified: Secondary | ICD-10-CM

## 2016-01-13 DIAGNOSIS — C7951 Secondary malignant neoplasm of bone: Secondary | ICD-10-CM

## 2016-01-13 DIAGNOSIS — C3491 Malignant neoplasm of unspecified part of right bronchus or lung: Secondary | ICD-10-CM

## 2016-01-13 DIAGNOSIS — C787 Secondary malignant neoplasm of liver and intrahepatic bile duct: Secondary | ICD-10-CM | POA: Diagnosis not present

## 2016-01-13 DIAGNOSIS — C3411 Malignant neoplasm of upper lobe, right bronchus or lung: Secondary | ICD-10-CM | POA: Diagnosis not present

## 2016-01-13 DIAGNOSIS — Z5111 Encounter for antineoplastic chemotherapy: Secondary | ICD-10-CM

## 2016-01-13 MED ORDER — VALACYCLOVIR HCL 1 G PO TABS: 500.0000 mg | ORAL_TABLET | Freq: Three times a day (TID) | ORAL | Status: DC

## 2016-01-13 MED FILL — *TAGRISSO 80MG TABLET: 80 | 30 days supply | Qty: 30 | Fill #0

## 2016-01-13 MED FILL — valACYclovir HCL 1 GM TABS: 1 | 14 days supply | Qty: 21 | Fill #0

## 2016-01-13 NOTE — Assessment & Plan Note (Signed)
Patient complains of severe sore throat for the past few days.  He is also suffered with some nasal congestion and a dry cough.  He states his fever was to the maximum of 100.5 within the past few days.  He has been taking intermittent Tylenol only.  The patient denies any GI symptoms whatsoever.  Exam today reveals patient with nasal congestion.  He also has increased erythema to the posterior oropharynx; with multiple blister-like lesions to the posterior throat as well.  Patient does have a dry cough; but breath sounds are clear bilaterally with no wheezes.  Vital signs were stable today with the exception of a temperature of 99.5.  Did obtain a flu, strep, and viral/herpetic swab of the throat for culture.  Both the flu and the strep swabs were negative.  Awaiting the viral culture results.  Reviewed all with Dr. Julien Nordmann; and will prescribe Augmentin twice daily for a total of 10 days.  Also prescribed Magic mouthwash with lidocaine as well.  Will follow up with the viral/herpetic culture appropriately.  Patient was advised to call/return or go directly to the emergency department for any worsening symptoms whatsoever.

## 2016-01-13 NOTE — Progress Notes (Signed)
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 Prior Auth for [employer]. Copay is $30 at [place]  Thank you,  Montel Clock, PharmD, [facility]

## 2016-01-13 NOTE — Assessment & Plan Note (Signed)
Patient received his Delton See for known bone metastasis today.  He has been taking the tagrisso oral therapy; that most recent restaging scan revealed progression.  He is scheduled to meet tomorrow, 01/13/2016 with Dr. Julien Nordmann to review treatment options.

## 2016-01-13 NOTE — Progress Notes (Signed)
SYMPTOM MANAGEMENT CLINIC    Chief Complaint: Pharyngitis  HPI:  Oscar Floyd 50 y.o. male diagnosed with lung cancer with liver, brain, and bone metastasis.   Patient complains of severe sore throat for the past few days.  He is also suffered with some nasal congestion and a dry cough.  He states his fever was to the maximum of 100.5 within the past few days.  He has been taking intermittent Tylenol only.  The patient denies any GI symptoms whatsoever.  Exam today reveals patient with nasal congestion.  He also has increased erythema to the posterior oropharynx; with multiple blister-like lesions to the posterior throat as well.  Patient does have a dry cough; but breath sounds are clear bilaterally with no wheezes.  Vital signs were stable today with the exception of a temperature of 99.5.  Did obtain a flu, strep, and viral/herpetic swab of the throat for culture.  Both the flu and the strep swabs were negative.  Awaiting the viral culture results.  Reviewed all with Dr. Julien Nordmann; and will prescribe Augmentin twice daily for a total of 10 days.  Also prescribed Magic mouthwash with lidocaine as well.  Will follow up with the viral/herpetic culture appropriately.  Patient was advised to call/return or go directly to the emergency department for any worsening symptoms whatsoever.   No history exists.    Review of Systems  Constitutional: Positive for fever, chills and malaise/fatigue.  HENT: Positive for congestion and sore throat.   All other systems reviewed and are negative.   Past Medical History  Diagnosis Date  . Hypertension   . Lung cancer ([place])     RUL lung with mets to liver, bone and brain  . Bone metastases ([place]) 08/26/2015  . Encounter for antineoplastic chemotherapy 01/06/2016    Past Surgical History  Procedure Laterality Date  . Vasectomy    . Video bronchoscopy Bilateral 03/20/2014    Procedure: VIDEO BRONCHOSCOPY WITH FLUORO;  Surgeon: Rigoberto Noel,  MD;  Location: [facility];  Service: Cardiopulmonary;  Laterality: Bilateral;    has Non-small cell cancer of right lung ([place]); Cough; Blood pressure elevated; Acid reflux; Numerous sub-centimeter brain metastases; Weight loss, abnormal; Drug-induced diarrhea; Drug-induced skin rash; Paronychia of finger; Bone metastases ([place]); Encounter for antineoplastic chemotherapy; and Pharyngitis on his problem list.    has No Known Allergies.    Medication List       This list is accurate as of: 01/12/16 11:59 PM.  Always use your most recent med list.               ALPRAZolam 1 MG tablet  Commonly known as:  XANAX  Reported on 01/12/2016     ALTABAX 1 % ointment  Generic drug:  retapamulin     amoxicillin-clavulanate 875-125 MG tablet  Commonly known as:  AUGMENTIN  Take 1 tablet by mouth 2 (two) times daily.     calcium-vitamin D 250-125 MG-UNIT tablet  Commonly known as:  OSCAL  Take 1 tablet by mouth daily. Reported on 01/12/2016     diazepam 10 MG tablet  Commonly known as:  VALIUM  Take 1 tablet (10 mg total) by mouth as directed. 30 min before MRI     loperamide 2 MG capsule  Commonly known as:  IMODIUM  Take 2 mg by mouth as needed for diarrhea or loose stools.     magic mouthwash w/lidocaine Soln  Take 5 mLs by mouth 4 (four) times daily as needed for  mouth pain ([employer] formula w/ lidocaine, nystatin 1:1.).     mirtazapine 30 MG tablet  Commonly known as:  REMERON  Take 1 tablet (30 mg total) by mouth at bedtime.     naproxen sodium 220 MG tablet  Commonly known as:  ANAPROX  Take 220 mg by mouth as needed.     NEOMYCIN-POLYMYXIN-HYDROCORTISONE 1 % Soln otic solution  Commonly known as:  CORTISPORIN  Apply 1-2 drops to toe BID after soaking     neomycin-polymyxin-hydrocortisone otic solution  Commonly known as:  CORTISPORIN  Apply one to two drops to toe after soaking twice daily.     NEXIUM 20 MG capsule  Generic drug:  esomeprazole  Take by mouth 2 (two)  times daily before a meal.     osimertinib mesylate 80 MG tablet  Commonly known as:  TAGRISSO  Take 1 tablet (80 mg total) by mouth daily.         PHYSICAL EXAMINATION  Oncology Vitals 01/12/2016 01/06/2016  Height 188 cm 188 cm  Weight 92.851 kg 95.482 kg  Weight (lbs) 204 lbs 11 oz 210 lbs 8 oz  BMI (kg/m2) 26.28 kg/m2 27.03 kg/m2  Temp 99.5 98.2  Pulse 88 88  Resp 20 17  SpO2 100 100  BSA (m2) 2.2 m2 2.23 m2   BP Readings from Last 2 Encounters:  01/12/16 134/82  01/06/16 119/83    Physical Exam  Constitutional: He is oriented to person, place, and time and well-developed, well-nourished, and in no distress.  HENT:  Head: Normocephalic and atraumatic.  Exam today reveals patient with nasal congestion.  He also has increased erythema to the posterior oropharynx; with multiple blister-like lesions to the posterior throat as well.  Patient does have a dry cough; but breath sounds are clear bilaterally with no wheezes.        Eyes: Conjunctivae and EOM are normal. Pupils are equal, round, and reactive to light. Right eye exhibits no discharge. Left eye exhibits no discharge. No scleral icterus.  Neck: Normal range of motion. Neck supple. No JVD present. No tracheal deviation present. No thyromegaly present.  Cardiovascular: Normal rate, regular rhythm, normal heart sounds and intact distal pulses.   Pulmonary/Chest: Effort normal and breath sounds normal. No stridor. No respiratory distress. He has no wheezes. He has no rales. He exhibits no tenderness.  Dry cough only  Abdominal: Soft. Bowel sounds are normal. He exhibits no distension and no mass. There is no tenderness. There is no rebound and no guarding.  Musculoskeletal: Normal range of motion. He exhibits no edema or tenderness.  Lymphadenopathy:    He has no cervical adenopathy.  Neurological: He is alert and oriented to person, place, and time. Gait normal.  Skin: Skin is warm and dry. No rash noted. No erythema.  No pallor.  Psychiatric: Affect normal.  Nursing note and vitals reviewed.   LABORATORY DATA:. Orders Only on 01/12/2016  Component Date Value Ref Range Status  . Strep Gp A Ag, IA W/Reflex 01/12/2016 Negative  Negative Preliminary  . Influenza A Ag, EIA 01/12/2016 Negative  Negative Final  . Influenza B Ag, EIA 01/12/2016 Negative  Negative Final  . Influenza Comment 01/12/2016 See note   Final  . Please note: 01/12/2016 Comment   Final   Comment: Because a negative rapid influenza antigen EIA test may not rule-out influenza viral infection, the CDC and other public health agencies have recommended that confirmatory testing using viral culture or nucleic acid amplification should be considered  when clinical signs and symptoms are consistent with influenza-like illness, as well as to assist in detecting other respiratory viruses that can produce similar clinical symptoms. (Reference: https://lee-mcguire.com/ guidance_ridt.pdf)   Appointment on 01/12/2016  Component Date Value Ref Range Status  . WBC 01/12/2016 7.8  4.0 - 10.3 10e3/uL Final  . NEUT# 01/12/2016 5.8  1.5 - 6.5 10e3/uL Final  . HGB 01/12/2016 13.4  13.0 - 17.1 g/dL Final  . HCT 01/12/2016 39.7  38.4 - 49.9 % Final  . Platelets 01/12/2016 215  140 - 400 10e3/uL Final  . MCV 01/12/2016 86.3  79.3 - 98.0 fL Final  . MCH 01/12/2016 29.1  27.2 - 33.4 pg Final  . MCHC 01/12/2016 33.8  32.0 - 36.0 g/dL Final  . RBC 01/12/2016 4.60  4.20 - 5.82 10e6/uL Final  . RDW 01/12/2016 13.4  11.0 - 14.6 % Final  . lymph# 01/12/2016 1.1  0.9 - 3.3 10e3/uL Final  . MONO# 01/12/2016 0.7  0.1 - 0.9 10e3/uL Final  . Eosinophils Absolute 01/12/2016 0.2  0.0 - 0.5 10e3/uL Final  . Basophils Absolute 01/12/2016 0.0  0.0 - 0.1 10e3/uL Final  . NEUT% 01/12/2016 74.0  39.0 - 75.0 % Final  . LYMPH% 01/12/2016 14.2  14.0 - 49.0 % Final  . MONO% 01/12/2016 8.9  0.0 - 14.0 % Final  . EOS% 01/12/2016 2.6  0.0 - 7.0  % Final  . BASO% 01/12/2016 0.3  0.0 - 2.0 % Final  . Sodium 01/12/2016 141  136 - 145 mEq/L Final  . Potassium 01/12/2016 3.9  3.5 - 5.1 mEq/L Final  . Chloride 01/12/2016 106  98 - 109 mEq/L Final  . CO2 01/12/2016 28  22 - 29 mEq/L Final  . Glucose 01/12/2016 90  70 - 140 mg/dl Final   Glucose reference range is for nonfasting patients. Fasting glucose reference range is 70- 100.  Marland Kitchen BUN 01/12/2016 8.5  7.0 - 26.0 mg/dL Final  . Creatinine 01/12/2016 1.1  0.7 - 1.3 mg/dL Final  . Total Bilirubin 01/12/2016 1.02  0.20 - 1.20 mg/dL Final  . Alkaline Phosphatase 01/12/2016 207* 40 - 150 U/L Final  . AST 01/12/2016 27  5 - 34 U/L Final  . ALT 01/12/2016 25  0 - 55 U/L Final  . Total Protein 01/12/2016 6.8  6.4 - 8.3 g/dL Final  . Albumin 01/12/2016 3.5  3.5 - 5.0 g/dL Final  . Calcium 01/12/2016 8.7  8.4 - 10.4 mg/dL Final  . Anion Gap 01/12/2016 7  3 - 11 mEq/L Final  . EGFR 01/12/2016 76* >90 ml/min/1.73 m2 Final   eGFR is calculated using the CKD-EPI Creatinine Equation (2009)    RADIOGRAPHIC STUDIES: No results found.  ASSESSMENT/PLAN:    Pharyngitis Patient complains of severe sore throat for the past few days.  He is also suffered with some nasal congestion and a dry cough.  He states his fever was to the maximum of 100.5 within the past few days.  He has been taking intermittent Tylenol only.  The patient denies any GI symptoms whatsoever.  Exam today reveals patient with nasal congestion.  He also has increased erythema to the posterior oropharynx; with multiple blister-like lesions to the posterior throat as well.  Patient does have a dry cough; but breath sounds are clear bilaterally with no wheezes.  Vital signs were stable today with the exception of a temperature of 99.5.  Did obtain a flu, strep, and viral/herpetic swab of the throat for culture.  Both the flu and the strep swabs were negative.  Awaiting the viral culture results.  Reviewed all with Dr. Julien Nordmann; and  will prescribe Augmentin twice daily for a total of 10 days.  Also prescribed Magic mouthwash with lidocaine as well.  Will follow up with the viral/herpetic culture appropriately.  Patient was advised to call/return or go directly to the emergency department for any worsening symptoms whatsoever.  Non-small cell cancer of right lung [facility]) Patient received his Delton See for known bone metastasis today.  He has been taking the tagrisso oral therapy; that most recent restaging scan revealed progression.  He is scheduled to meet tomorrow, 01/13/2016 with Dr. Julien Nordmann to review treatment options.     Patient stated understanding of all instructions; and was in agreement with this plan of care. The patient knows to call the clinic with any yesterday.  Her, questions or concerns.   Total time spent with patient was 25 minutes;  with greater than 75 percent of that time spent in face to face counseling regarding patient's symptoms,  and coordination of care and follow up.  Disclaimer:This dictation was prepared with Dragon/digital dictation along with [employer]. Any transcriptional errors that result from this process are unintentional.  Drue Second, NP 01/13/2016   ADDENDUM: Hematology/Oncology Attending: I had a face to face encounter with the patient. I recommended his care plan. This is a very pleasant 50 years old white male with [name] with recent evidence for disease progression and he was on treatment with Gilotrif for around 20 months. He presented today complaining of low-grade fever as well as significant soreness in his throat and mouth. He has difficulty swallowing. He has been taking Tylenol and Aleve for the last 24-hour with minimal improvement. His oral exam showed several blisters in the form of the mouth and oropharyngeal area. We will every swabs for streptococcal as well as influenza A and B today. We will start the patient empirically on  Augmentin. He was also giving Magic mouthwash with lidocaine. We will continue to monitor him closely and consider changing treatment as needed. He was advised to call immediately if he has any concerning symptoms in the interval.  Disclaimer: This note was dictated with voice recognition software. Similar sounding words can inadvertently be transcribed and may be missed upon review. Eilleen Kempf., MD 01/13/2016

## 2016-01-13 NOTE — Progress Notes (Signed)
[facility] [phone]   [phone]  OFFICE PROGRESS NOTE  [name], MD [place] [id number]  DIAGNOSIS: stage IV (T2a, N0, M1b) non-small cell lung cancer consistent with poorly differentiated adenocarcinoma with positive EGFR mutation with deletion in exon 19, diagnosed in July of 2015 and presented with right upper lobe lung mass in addition to extensive liver, brain and bone metastases.  He had disease progression and development of EGFR T790M resistant mutation in April 2017. Primary site: Lung (Right)  Staging method: AJCC 7th Edition  Clinical: Stage IV (T2a, N0, M1b) signed by [name], MD on 03/26/2014 4:57 PM  Summary: Stage IV (T2a, N0, M1b)   PRIOR THERAPY:  1) Status post whole brain irradiation under the care Dr. Tammi Klippel completed 04/12/2014. 2) Gilotrif 40 mg po daily - therapy beginning 04/03/2014. Status post approximately 20 months of therapy discontinued secondary to disease progression and development of EGFR T790M resistant mutation.   CURRENT THERAPY:  1) Tagrisso 80 mg by mouth daily. First dose is expected in the next few days.  2) Xgeva 120 mcg subcutaneously on monthly basis.   DISEASE STAGE:  Lung cancer  Primary site: Lung (Right)  Staging method: AJCC 7th Edition  Clinical: Stage IV (T2a, N0, M1b) signed by [name], MD on 03/26/2014 4:57 PM  Summary: Stage IV (T2a, N0, M1b)  CHEMOTHERAPY INTENT: palliative  CURRENT # OF CHEMOTHERAPY CYCLES: 1 CURRENT ANTIEMETICS: none  CURRENT SMOKING STATUS: non-smoker/never smoker  ORAL CHEMOTHERAPY AND CONSENT: yes- Gilotrif, Tagrisso  CURRENT BISPHOSPHONATES USE: None  PAIN MANAGEMENT: none  NARCOTICS INDUCED CONSTIPATION: None  LIVING WILL AND CODE STATUS:   INTERVAL HISTORY: Oscar Floyd 50 y.o. male returns to the clinic today for followup visit. The patient is feeling fine today with no specific complaints except for  persistent severe sore throat started few days ago with low-grade fever. He had throat swab yesterday that showed no evidence for streptococcal infection or Influenza A or B. he was started empirically on treatment with Augmentin yesterday but no improvement in his condition. He has been on treatment with Gilotrif and tolerating it fairly well with no significant adverse effects except for the persistent mild skin rash on the nose and upper chest and back. He also continues to have paronychia of the big toes. Unfortunately he was found recently to have evidence for significant disease progression. Plasma test for EGFR mutation showed the development of resistant EGFR T790M mutation. The patient denied having any significant chest pain, shortness of breath, cough or hemoptysis. He is here today for evaluation and discussion of his treatment options based on the recent findings. MEDICAL HISTORY: Past Medical History  Diagnosis Date  . Hypertension   . Lung cancer ([place])     RUL lung with mets to liver, bone and brain  . Bone metastases ([place]) 08/26/2015  . Encounter for antineoplastic chemotherapy 01/06/2016    ALLERGIES:  has No Known Allergies.  MEDICATIONS:  Current Outpatient Prescriptions  Medication Sig Dispense Refill  . ALPRAZolam (XANAX) 1 MG tablet Reported on 01/12/2016    . ALTABAX 1 % ointment     . amoxicillin-clavulanate (AUGMENTIN) 875-125 MG tablet Take 1 tablet by mouth 2 (two) times daily. 20 tablet 0  . calcium-vitamin D (OSCAL) 250-125 MG-UNIT per tablet Take 1 tablet by mouth daily. Reported on 01/12/2016    . diazepam (VALIUM) 10 MG tablet Take 1 tablet (10 mg total)  by mouth as directed. 30 min before MRI 15 tablet 0  . esomeprazole (NEXIUM) 20 MG capsule Take by mouth 2 (two) times daily before a meal.     . loperamide (IMODIUM) 2 MG capsule Take 2 mg by mouth as needed for diarrhea or loose stools.    . magic mouthwash w/lidocaine SOLN Take 5 mLs by mouth 4 (four) times daily  as needed for mouth pain ([employer] formula w/ lidocaine, nystatin 1:1.). 240 mL 1  . mirtazapine (REMERON) 30 MG tablet Take 1 tablet (30 mg total) by mouth at bedtime. 30 tablet 2  . naproxen sodium (ANAPROX) 220 MG tablet Take 220 mg by mouth as needed.    . NEOMYCIN-POLYMYXIN-HYDROCORTISONE (CORTISPORIN) 1 % SOLN otic solution Apply 1-2 drops to toe BID after soaking 10 mL 1  . neomycin-polymyxin-hydrocortisone (CORTISPORIN) otic solution Apply one to two drops to toe after soaking twice daily. 10 mL 0  . osimertinib mesylate (TAGRISSO) 80 MG tablet Take 1 tablet (80 mg total) by mouth daily. 30 tablet 2  . cephALEXin (KEFLEX) 500 MG capsule TAKE 1 CAPSULE (500 MG TOTAL) BY MOUTH 3 (THREE) TIMES DAILY.  1  . GILOTRIF 40 MG tablet     . neomycin-polymyxin-hydrocortisone (CORTISPORIN) 3.5-10000-1 otic suspension APPLY 1-2 DROPS TO TOE TWICE A DAY AFTER SOAKING  1   No current facility-administered medications for this visit.    SURGICAL HISTORY:  Past Surgical History  Procedure Laterality Date  . Vasectomy    . Video bronchoscopy Bilateral 03/20/2014    Procedure: VIDEO BRONCHOSCOPY WITH FLUORO;  Surgeon: Rigoberto Noel, MD;  Location: [facility];  Service: Cardiopulmonary;  Laterality: Bilateral;    REVIEW OF SYSTEMS:  Constitutional: positive for fevers Eyes: negative Ears, nose, mouth, throat, and face: positive for sore mouth and sore throat Respiratory: negative Cardiovascular: negative Gastrointestinal: negative Genitourinary:negative Integument/breast: negative Hematologic/lymphatic: negative Musculoskeletal:negative Neurological: negative Behavioral/Psych: negative Endocrine: negative Allergic/Immunologic: negative   PHYSICAL EXAMINATION: General appearance: alert, cooperative and no distress Head: Normocephalic, without obvious abnormality, atraumatic Neck: no adenopathy, no JVD, supple, symmetrical, trachea midline and thyroid not enlarged, symmetric, no  tenderness/mass/nodules Lymph nodes: Cervical, supraclavicular, and axillary nodes normal. Resp: clear to auscultation bilaterally Back: symmetric, no curvature. ROM normal. No CVA tenderness. Cardio: regular rate and rhythm, S1, S2 normal, no murmur, click, rub or gallop GI: soft, non-tender; bowel sounds normal; no masses,  no organomegaly Extremities: extremities normal, atraumatic, no cyanosis or edema Neurologic: Alert and oriented X 3, normal strength and tone. Normal symmetric reflexes. Normal coordination and gait  ECOG PERFORMANCE STATUS: 1 - Symptomatic but completely ambulatory  There were no vitals taken for this visit.  LABORATORY DATA: Lab Results  Component Value Date   WBC 7.8 01/12/2016   HGB 13.4 01/12/2016   HCT 39.7 01/12/2016   MCV 86.3 01/12/2016   PLT 215 01/12/2016      Chemistry      Component Value Date/Time   NA 141 01/12/2016 1151   K 3.9 01/12/2016 1151   CO2 28 01/12/2016 1151   BUN 8.5 01/12/2016 1151   CREATININE 1.1 01/12/2016 1151      Component Value Date/Time   CALCIUM 8.7 01/12/2016 1151   ALKPHOS 207* 01/12/2016 1151   AST 27 01/12/2016 1151   ALT 25 01/12/2016 1151   BILITOT 1.02 01/12/2016 1151       RADIOGRAPHIC STUDIES: Ct Chest W Contrast  01/05/2016  CLINICAL DATA:  50 year old male with history of metastatic lung cancer to the  liver, bones and brain. Originally diagnosed in July 2015. Radiation therapy complete. Oral chemotherapy in progress. EXAM: CT CHEST, ABDOMEN, AND PELVIS WITH CONTRAST TECHNIQUE: Multidetector CT imaging of the chest, abdomen and pelvis was performed following the standard protocol during bolus administration of intravenous contrast. CONTRAST:  158m ISOVUE-300 IOPAMIDOL (ISOVUE-300) INJECTION 61% COMPARISON:  CT of the chest, abdomen and pelvis 08/22/2015. FINDINGS: CT CHEST FINDINGS Mediastinum/Lymph Nodes: Heart size is normal. There is no significant pericardial fluid, thickening or pericardial  calcification. No pathologically enlarged mediastinal or hilar lymph nodes. Esophagus is unremarkable in appearance. No axillary lymphadenopathy. Lungs/Pleura: The previously noted pulmonary nodule in the inferior aspect of the right upper lobe (image 82 of series 4) is roughly stable in size measuring 16 x 12 mm (mean diameter of 14 mm) on today's study. However, there are now innumerable new pulmonary nodules scattered throughout all aspects of the lungs bilaterally, compatible with widespread metastatic disease to the lungs, the largest of which is a 2.1 x 1.6 x 1.2 cm nodule in the inferior aspect of the lingula (image 106 of series 4 and sagittal image 136 of series 603). In addition, there are areas of interlobular septal thickening and nodularity which are most prevalent throughout the visualize lung bases, concerning for developing lymphangitic spread of disease. No confluent consolidative airspace disease. No pleural effusions. Musculoskeletal/Soft Tissues: Diffuse predominantly sclerotic lesions are again noted throughout the visualized axial and appendicular skeleton, compatible with widespread metastatic disease to the bones. This overall appears very similar to the prior examination. CT ABDOMEN AND PELVIS FINDINGS Hepatobiliary: There are several new sub cm hypovascular liver lesions which are incompletely characterized on today's examination due to their small size, but are new compared to prior examination 08/22/2015, concerning for metastatic disease. The largest of these lesions is in segment 4A (image 53 of series 2) measuring 7 mm. Pancreas: No pancreatic mass. No pancreatic ductal dilatation. No pancreatic or peripancreatic fluid or inflammatory changes. Spleen: Unremarkable. Adrenals/Urinary Tract: Sub cm low-attenuation lesion in the interpolar region of the right kidney is too small to definitively characterize, but is statistically likely a tiny cyst. Bilateral adrenal glands and the right  kidney are normal in appearance. No hydroureteronephrosis. Urinary bladder is normal in appearance. Stomach/Bowel: The appearance of the stomach is normal. There is no pathologic dilatation of small bowel or colon. Normal appendix. Vascular/Lymphatic: No significant atherosclerotic disease, aneurysm or dissection identified in the abdominal or pelvic vasculature. No lymphadenopathy noted in the abdomen or pelvis. Reproductive: Prostate gland and seminal vesicles are unremarkable in appearance. Other: No significant volume of ascites.  No pneumoperitoneum. Musculoskeletal: Widespread predominantly sclerotic lesions throughout all aspects of the visualized osseous and appendicular skeleton, similar to the prior examination, compatible with widespread metastatic disease to the bones. IMPRESSION: 1. Today's study demonstrates progression of disease. Specifically, while the previously described right upper lobe nodule is essentially stable in size, there are innumerable new and enlarging pulmonary nodules throughout the lungs bilaterally, compatible with widespread metastatic disease to the lungs, and there is evidence of developing lymphangitic spread of disease throughout the lung bases. In addition, there are several new hypovascular liver lesions which are too small to characterize, but are certainly suspicious for metastatic disease to the liver on today's examination. Widespread metastatic disease to the bones appears essentially unchanged compared to the prior examination. 2. Additional incidental findings, as above. Electronically Signed   By: DVinnie LangtonM.D.   On: 01/05/2016 11:25   Ct Abdomen Pelvis W Contrast  01/05/2016  CLINICAL DATA:  50 year old male with history of metastatic lung cancer to the liver, bones and brain. Originally diagnosed in July 2015. Radiation therapy complete. Oral chemotherapy in progress. EXAM: CT CHEST, ABDOMEN, AND PELVIS WITH CONTRAST TECHNIQUE: Multidetector CT imaging  of the chest, abdomen and pelvis was performed following the standard protocol during bolus administration of intravenous contrast. CONTRAST:  185m ISOVUE-300 IOPAMIDOL (ISOVUE-300) INJECTION 61% COMPARISON:  CT of the chest, abdomen and pelvis 08/22/2015. FINDINGS: CT CHEST FINDINGS Mediastinum/Lymph Nodes: Heart size is normal. There is no significant pericardial fluid, thickening or pericardial calcification. No pathologically enlarged mediastinal or hilar lymph nodes. Esophagus is unremarkable in appearance. No axillary lymphadenopathy. Lungs/Pleura: The previously noted pulmonary nodule in the inferior aspect of the right upper lobe (image 82 of series 4) is roughly stable in size measuring 16 x 12 mm (mean diameter of 14 mm) on today's study. However, there are now innumerable new pulmonary nodules scattered throughout all aspects of the lungs bilaterally, compatible with widespread metastatic disease to the lungs, the largest of which is a 2.1 x 1.6 x 1.2 cm nodule in the inferior aspect of the lingula (image 106 of series 4 and sagittal image 136 of series 603). In addition, there are areas of interlobular septal thickening and nodularity which are most prevalent throughout the visualize lung bases, concerning for developing lymphangitic spread of disease. No confluent consolidative airspace disease. No pleural effusions. Musculoskeletal/Soft Tissues: Diffuse predominantly sclerotic lesions are again noted throughout the visualized axial and appendicular skeleton, compatible with widespread metastatic disease to the bones. This overall appears very similar to the prior examination. CT ABDOMEN AND PELVIS FINDINGS Hepatobiliary: There are several new sub cm hypovascular liver lesions which are incompletely characterized on today's examination due to their small size, but are new compared to prior examination 08/22/2015, concerning for metastatic disease. The largest of these lesions is in segment 4A (image 53  of series 2) measuring 7 mm. Pancreas: No pancreatic mass. No pancreatic ductal dilatation. No pancreatic or peripancreatic fluid or inflammatory changes. Spleen: Unremarkable. Adrenals/Urinary Tract: Sub cm low-attenuation lesion in the interpolar region of the right kidney is too small to definitively characterize, but is statistically likely a tiny cyst. Bilateral adrenal glands and the right kidney are normal in appearance. No hydroureteronephrosis. Urinary bladder is normal in appearance. Stomach/Bowel: The appearance of the stomach is normal. There is no pathologic dilatation of small bowel or colon. Normal appendix. Vascular/Lymphatic: No significant atherosclerotic disease, aneurysm or dissection identified in the abdominal or pelvic vasculature. No lymphadenopathy noted in the abdomen or pelvis. Reproductive: Prostate gland and seminal vesicles are unremarkable in appearance. Other: No significant volume of ascites.  No pneumoperitoneum. Musculoskeletal: Widespread predominantly sclerotic lesions throughout all aspects of the visualized osseous and appendicular skeleton, similar to the prior examination, compatible with widespread metastatic disease to the bones. IMPRESSION: 1. Today's study demonstrates progression of disease. Specifically, while the previously described right upper lobe nodule is essentially stable in size, there are innumerable new and enlarging pulmonary nodules throughout the lungs bilaterally, compatible with widespread metastatic disease to the lungs, and there is evidence of developing lymphangitic spread of disease throughout the lung bases. In addition, there are several new hypovascular liver lesions which are too small to characterize, but are certainly suspicious for metastatic disease to the liver on today's examination. Widespread metastatic disease to the bones appears essentially unchanged compared to the prior examination. 2. Additional incidental findings, as above.  Electronically Signed   By: DQuillian Quince  Entrikin M.D.   On: 01/05/2016 11:25   ASSESSMENT AND PLAN: This is a very pleasant 50 years old white male with [name], adenocarcinoma with positive EGFR mutation in exon 19 currently undergoing treatment with oral Gilotrif 40 mg by mouth daily status post 20 months and tolerating his treatment fairly well except for the grade 1 skin rash and mild paronychia of the toes as well as mild diarrhea once daily. He takes Imodium at regular basis usually in the morning. Unfortunately the recent CT scan of the chest, abdomen and pelvis showed evidence for disease progression with innumerable new and enlarging pulmonary nodules throughout the lungs bilaterally as well as new liver lesions. The plasma test for the EGFR mutation showed EGFR resistant mutation, T790M. I recommended for the patient to discontinue his treatment with Gilotrif. I recommended for him treatment with Tagrisso 80 mg by mouth daily. I sent the prescription to [facility] outpatient pharmacy. I also arranged for the patient to have EKG today and order 2-D echo to evaluate his ejection fraction before starting treatment with Tagrisso. I discussed with the patient adverse effect of the treatment with Tagrisso and he was given handouts about the medication. For the sore mouth and throat, the patient will continue treatment with Augmentin for now and I added Valtrex 500 mg by mouth 3 times a day for 7 days. For the metastatic bone disease, the patient will continue with monthly Xgeva. He will also continue on calcium and vitamin D supplement. For the weight loss and depression, he is currently on Remeron 30 mg by mouth each bedtime. He would come back for followup visit in around 2 weeks with repeat CBC and comprehensive metabolic panel for close monitoring of the new medication and to manage any adverse effect of Tagrisso. He was advised to call immediately if he has any  concerning symptoms in the interval. The patient voices understanding of current disease status and treatment options and is in agreement with the current care plan.  All questions were answered. The patient knows to call the clinic with any problems, questions or concerns. We can certainly see the patient much sooner if necessary.  Disclaimer: This note was dictated with voice recognition software. Similar sounding words can inadvertently be transcribed and may not be corrected upon review.

## 2016-01-13 NOTE — Progress Notes (Signed)
Oncology Nurse Navigator Documentation  Oncology Nurse Navigator Flowsheets 01/13/2016  Navigator Encounter Type Clinic/MDC  Patient Visit Type MedOnc  Treatment Phase Treatment  Barriers/Navigation Needs Education  Education Other  Interventions Coordination of Care  Education Method Verbal;Written  Acuity Level 2  Acuity Level 2 Educational needs  Time Spent with Patient 30   Spoke with patient today.  Gave and explained information on new oral tx.

## 2016-01-14 ENCOUNTER — Telehealth: Payer: Self-pay | Admitting: *Deleted

## 2016-01-14 ENCOUNTER — Telehealth: Payer: Self-pay | Admitting: Internal Medicine

## 2016-01-14 LAB — RAPID STREP SCREEN (MED CTR MEBANE ONLY)
Strep A Culture: NEGATIVE
Strep Gp A Ag, IA W/Reflex: NEGATIVE

## 2016-01-14 NOTE — Telephone Encounter (Signed)
Oncology Nurse Navigator Documentation  Oncology Nurse Navigator Flowsheets 01/14/2016  Navigator Encounter Type Telephone  Telephone Outgoing Call  Treatment Phase Treatment  Education Other  Acuity Level 1  Acuity Level 1 Minimal follow up required  Time Spent with Patient 15   I called and spoke with Oscar Floyd.  I wanted to follow up to see if he was feeling better today.  He stated he is feeling a little bit better.  I asked that he call cancer center if he is feeling worse or developed other symptoms.

## 2016-01-14 NOTE — Telephone Encounter (Signed)
per pof to sch pt appt-sent [place] email to precert ECHO-will call pt once reply

## 2016-01-16 ENCOUNTER — Encounter: Payer: Self-pay | Admitting: Pharmacist

## 2016-01-16 NOTE — Progress Notes (Signed)
Oral Chemotherapy Pharmacist Encounter   I spoke with patient for overview of new oral chemotherapy medication: Tagrisso. Pt is doing well. The prescriptions have been sent to the [facility] for benefit analysis and approval. Copay is $30. Patient picked up medication on 5/3 and started medication yesterday on Thursday, 5/4  Counseled patient on administration, dosing, side effects, safe handling, and monitoring. Side effects include but not limited to: Diarrhea, fatigue, rash, nail toxicity, nausea, and myelosuppression.  Mr. Oscar Floyd voiced understanding and appreciation.   All questions answered.  Will follow up in 1 week for adherence and toxicity management.   Thank you,  Montel Clock, PharmD, [facility]

## 2016-01-20 LAB — VIRUS CULTURE

## 2016-01-21 ENCOUNTER — Other Ambulatory Visit: Payer: BLUE CROSS/BLUE SHIELD

## 2016-01-21 ENCOUNTER — Ambulatory Visit: Payer: BLUE CROSS/BLUE SHIELD | Admitting: Internal Medicine

## 2016-01-22 ENCOUNTER — Telehealth: Payer: Self-pay | Admitting: *Deleted

## 2016-01-22 NOTE — Telephone Encounter (Signed)
Oncology Nurse Navigator Documentation  Oncology Nurse Navigator Flowsheets 01/22/2016  Navigator Encounter Type Telephone  Telephone Outgoing Call  Treatment Phase Treatment  Barriers/Navigation Needs Coordination of Care  Interventions Coordination of Care  Coordination of Care Other  Acuity Level 1  Time Spent with Patient 15   Called and spoke with patient today.  I asked how he was feeling.  I listened as he explained.  I asked how he was doing with new oral biologic.  He has not noted any side effects.    He did ask when should he have a brain scan and see Dr. Tammi Klippel.  I stated I would follow up with Dr. Johny Shears team and follow up.  He was thankful for the help.

## 2016-01-23 ENCOUNTER — Telehealth: Payer: Self-pay | Admitting: Internal Medicine

## 2016-01-23 ENCOUNTER — Other Ambulatory Visit: Payer: Self-pay | Admitting: Radiation Therapy

## 2016-01-23 DIAGNOSIS — C7931 Secondary malignant neoplasm of brain: Secondary | ICD-10-CM

## 2016-01-23 DIAGNOSIS — C7949 Secondary malignant neoplasm of other parts of nervous system: Principal | ICD-10-CM

## 2016-01-23 NOTE — Telephone Encounter (Signed)
per pof to sch pt ECHO-cld & spoke to pt & gave pt time/date/location of ECHO

## 2016-01-26 ENCOUNTER — Ambulatory Visit (HOSPITAL_COMMUNITY)
Admission: RE | Admit: 2016-01-26 | Discharge: 2016-01-26 | Disposition: A | Discharge status: Home / Self Care | Payer: BLUE CROSS/BLUE SHIELD | Source: Ambulatory Visit | Attending: Internal Medicine | Admitting: Internal Medicine

## 2016-01-26 DIAGNOSIS — C7951 Secondary malignant neoplasm of bone: Secondary | ICD-10-CM

## 2016-01-26 DIAGNOSIS — J029 Acute pharyngitis, unspecified: Secondary | ICD-10-CM | POA: Insufficient documentation

## 2016-01-26 DIAGNOSIS — I340 Nonrheumatic mitral (valve) insufficiency: Secondary | ICD-10-CM | POA: Diagnosis not present

## 2016-01-26 DIAGNOSIS — C3491 Malignant neoplasm of unspecified part of right bronchus or lung: Secondary | ICD-10-CM | POA: Insufficient documentation

## 2016-01-26 DIAGNOSIS — I119 Hypertensive heart disease without heart failure: Secondary | ICD-10-CM | POA: Diagnosis not present

## 2016-01-26 DIAGNOSIS — Z5111 Encounter for antineoplastic chemotherapy: Secondary | ICD-10-CM | POA: Diagnosis not present

## 2016-01-26 DIAGNOSIS — I071 Rheumatic tricuspid insufficiency: Secondary | ICD-10-CM | POA: Insufficient documentation

## 2016-01-26 DIAGNOSIS — Z09 Encounter for follow-up examination after completed treatment for conditions other than malignant neoplasm: Secondary | ICD-10-CM | POA: Diagnosis present

## 2016-01-26 NOTE — Progress Notes (Signed)
  Echocardiogram 2D Echocardiogram has been performed.  Diamond Nickel 01/26/2016, 3:57 PM

## 2016-01-27 ENCOUNTER — Encounter: Payer: Self-pay | Admitting: Internal Medicine

## 2016-01-27 ENCOUNTER — Encounter: Payer: Self-pay | Admitting: *Deleted

## 2016-01-27 ENCOUNTER — Ambulatory Visit (HOSPITAL_BASED_OUTPATIENT_CLINIC_OR_DEPARTMENT_OTHER): Payer: BLUE CROSS/BLUE SHIELD | Admitting: Internal Medicine

## 2016-01-27 ENCOUNTER — Other Ambulatory Visit (HOSPITAL_BASED_OUTPATIENT_CLINIC_OR_DEPARTMENT_OTHER): Payer: BLUE CROSS/BLUE SHIELD

## 2016-01-27 ENCOUNTER — Telehealth: Payer: Self-pay | Admitting: Internal Medicine

## 2016-01-27 VITALS — BP 131/84 | HR 71 | Temp 98.2°F | Resp 18 | Ht 74.0 in | Wt 206.4 lb

## 2016-01-27 DIAGNOSIS — C7951 Secondary malignant neoplasm of bone: Secondary | ICD-10-CM

## 2016-01-27 DIAGNOSIS — C7931 Secondary malignant neoplasm of brain: Secondary | ICD-10-CM | POA: Diagnosis not present

## 2016-01-27 DIAGNOSIS — C3411 Malignant neoplasm of upper lobe, right bronchus or lung: Secondary | ICD-10-CM | POA: Diagnosis not present

## 2016-01-27 DIAGNOSIS — C3491 Malignant neoplasm of unspecified part of right bronchus or lung: Secondary | ICD-10-CM

## 2016-01-27 DIAGNOSIS — C787 Secondary malignant neoplasm of liver and intrahepatic bile duct: Secondary | ICD-10-CM

## 2016-01-27 DIAGNOSIS — J029 Acute pharyngitis, unspecified: Secondary | ICD-10-CM

## 2016-01-27 DIAGNOSIS — L270 Generalized skin eruption due to drugs and medicaments taken internally: Secondary | ICD-10-CM

## 2016-01-27 DIAGNOSIS — Z5111 Encounter for antineoplastic chemotherapy: Secondary | ICD-10-CM

## 2016-01-27 DIAGNOSIS — L03039 Cellulitis of unspecified toe: Secondary | ICD-10-CM

## 2016-01-27 DIAGNOSIS — R197 Diarrhea, unspecified: Secondary | ICD-10-CM

## 2016-01-27 LAB — COMPREHENSIVE METABOLIC PANEL
ALBUMIN: 3.3 g/dL — AB (ref 3.5–5.0)
ALK PHOS: 167 U/L — AB (ref 40–150)
ALT: 28 U/L (ref 0–55)
AST: 26 U/L (ref 5–34)
Anion Gap: 4 mEq/L (ref 3–11)
BILIRUBIN TOTAL: 0.34 mg/dL (ref 0.20–1.20)
BUN: 6.3 mg/dL — AB (ref 7.0–26.0)
CALCIUM: 8.4 mg/dL (ref 8.4–10.4)
CO2: 26 mEq/L (ref 22–29)
Chloride: 111 mEq/L — ABNORMAL HIGH (ref 98–109)
Creatinine: 1 mg/dL (ref 0.7–1.3)
EGFR: 87 mL/min/{1.73_m2} — ABNORMAL LOW (ref >90)
Glucose: 85 mg/dl (ref 70–140)
POTASSIUM: 4.2 meq/L (ref 3.5–5.1)
Sodium: 141 mEq/L (ref 136–145)
Total Protein: 6.4 g/dL (ref 6.4–8.3)

## 2016-01-27 LAB — CBC WITH DIFFERENTIAL/PLATELET
BASO%: 0.7 % (ref 0.0–2.0)
BASOS ABS: 0 10*3/uL (ref 0.0–0.1)
EOS ABS: 0.1 10*3/uL (ref 0.0–0.5)
EOS%: 2.5 % (ref 0.0–7.0)
HEMATOCRIT: 38.7 % (ref 38.4–49.9)
HEMOGLOBIN: 12.7 g/dL — AB (ref 13.0–17.1)
LYMPH%: 26.2 % (ref 14.0–49.0)
MCH: 28.3 pg (ref 27.2–33.4)
MCHC: 32.9 g/dL (ref 32.0–36.0)
MCV: 86.1 fL (ref 79.3–98.0)
MONO#: 0.3 10*3/uL (ref 0.1–0.9)
MONO%: 7.2 % (ref 0.0–14.0)
NEUT#: 2.6 10*3/uL (ref 1.5–6.5)
NEUT%: 63.4 % (ref 39.0–75.0)
Platelets: 158 10*3/uL (ref 140–400)
RBC: 4.5 10*6/uL (ref 4.20–5.82)
RDW: 14.1 % (ref 11.0–14.6)
WBC: 4 10*3/uL (ref 4.0–10.3)
lymph#: 1.1 10*3/uL (ref 0.9–3.3)

## 2016-01-27 NOTE — Progress Notes (Signed)
[place] Telephone:(336) [phone]   Fax:(336) [phone]  OFFICE PROGRESS NOTE  [name], MD [place] [address]  DIAGNOSIS: stage IV (T2a, N0, M1b) non-small cell lung cancer consistent with poorly differentiated adenocarcinoma with positive EGFR mutation with deletion in exon 19, diagnosed in July of 2015 and presented with right upper lobe lung mass in addition to extensive liver, brain and bone metastases.  He had disease progression and development of EGFR T790M resistant mutation in April 2017. Primary site: Lung (Right)  Staging method: AJCC 7th Edition  Clinical: Stage IV (T2a, N0, M1b) signed by [name], MD on 03/26/2014 4:57 PM  Summary: Stage IV (T2a, N0, M1b)   PRIOR THERAPY:  1) Status post whole brain irradiation under the care Dr. Tammi Klippel completed 04/12/2014. 2) Gilotrif 40 mg po daily - therapy beginning 04/03/2014. Status post approximately 20 months of therapy discontinued secondary to disease progression and development of EGFR T790M resistant mutation.   CURRENT THERAPY:  1) Tagrisso 80 mg by mouth daily. First dose started 01/15/2016.   2) Xgeva 120 mcg subcutaneously on monthly basis.   DISEASE STAGE:  Lung cancer  Primary site: Lung (Right)  Staging method: AJCC 7th Edition  Clinical: Stage IV (T2a, N0, M1b) signed by [name], MD on 03/26/2014 4:57 PM  Summary: Stage IV (T2a, N0, M1b)  CHEMOTHERAPY INTENT: palliative  CURRENT # OF CHEMOTHERAPY CYCLES: 1 CURRENT ANTIEMETICS: none  CURRENT SMOKING STATUS: non-smoker/never smoker  ORAL CHEMOTHERAPY AND CONSENT: yes- Gilotrif, Tagrisso  CURRENT BISPHOSPHONATES USE: None  PAIN MANAGEMENT: none  NARCOTICS INDUCED CONSTIPATION: None  LIVING WILL AND CODE STATUS:   INTERVAL HISTORY: Oscar Floyd 50 y.o. male returns to the clinic today for followup visit. The patient is feeling fine today was no specific complaints. He started treatment with  Tagrisso on 01/15/2016 and tolerating his treatment well. He has less diarrhea and skin rash. He denied having any significant chest pain, shortness breath, cough or hemoptysis. He denied having any weight loss or night sweats. He has no nausea or vomiting, no fever or chills. He had repeat CBC and complaints metabolic panel performed earlier today and he is here for evaluation and discussion of his lab results.  MEDICAL HISTORY: Past Medical History  Diagnosis Date  . Hypertension   . Lung cancer ([place])     RUL lung with mets to liver, bone and brain  . Bone metastases ([place]) 08/26/2015  . Encounter for antineoplastic chemotherapy 01/06/2016    ALLERGIES:  has No Known Allergies.  MEDICATIONS:  Current Outpatient Prescriptions  Medication Sig Dispense Refill  . ALPRAZolam (XANAX) 1 MG tablet Reported on 01/12/2016    . ALTABAX 1 % ointment     . amoxicillin-clavulanate (AUGMENTIN) 875-125 MG tablet Take 1 tablet by mouth 2 (two) times daily. 20 tablet 0  . calcium-vitamin D (OSCAL) 250-125 MG-UNIT per tablet Take 1 tablet by mouth daily. Reported on 01/12/2016    . cephALEXin (KEFLEX) 500 MG capsule TAKE 1 CAPSULE (500 MG TOTAL) BY MOUTH 3 (THREE) TIMES DAILY.  1  . diazepam (VALIUM) 10 MG tablet Take 1 tablet (10 mg total) by mouth as directed. 30 min before MRI 15 tablet 0  . esomeprazole (NEXIUM) 20 MG capsule Take by mouth 2 (two) times daily before a meal.     . GILOTRIF 40 MG tablet     . loperamide (IMODIUM) 2 MG capsule Take 2 mg by mouth  as needed for diarrhea or loose stools.    . magic mouthwash w/lidocaine SOLN Take 5 mLs by mouth 4 (four) times daily as needed for mouth pain ([employer] formula w/ lidocaine, nystatin 1:1.). 240 mL 1  . mirtazapine (REMERON) 30 MG tablet Take 1 tablet (30 mg total) by mouth at bedtime. 30 tablet 2  . naproxen sodium (ANAPROX) 220 MG tablet Take 220 mg by mouth as needed.    . NEOMYCIN-POLYMYXIN-HYDROCORTISONE (CORTISPORIN) 1 % SOLN otic solution Apply  1-2 drops to toe BID after soaking 10 mL 1  . neomycin-polymyxin-hydrocortisone (CORTISPORIN) 3.5-10000-1 otic suspension APPLY 1-2 DROPS TO TOE TWICE A DAY AFTER SOAKING  1  . neomycin-polymyxin-hydrocortisone (CORTISPORIN) otic solution Apply one to two drops to toe after soaking twice daily. 10 mL 0  . osimertinib mesylate (TAGRISSO) 80 MG tablet Take 1 tablet (80 mg total) by mouth daily. 30 tablet 2  . valACYclovir (VALTREX) 1000 MG tablet Take 0.5 tablets (500 mg total) by mouth 3 (three) times daily. 21 tablet 0   No current facility-administered medications for this visit.    SURGICAL HISTORY:  Past Surgical History  Procedure Laterality Date  . Vasectomy    . Video bronchoscopy Bilateral 03/20/2014    Procedure: VIDEO BRONCHOSCOPY WITH FLUORO;  Surgeon: Rigoberto Noel, MD;  Location: [facility];  Service: Cardiopulmonary;  Laterality: Bilateral;    REVIEW OF SYSTEMS:  A comprehensive review of systems was negative.   PHYSICAL EXAMINATION: General appearance: alert, cooperative and no distress Head: Normocephalic, without obvious abnormality, atraumatic Neck: no adenopathy, no JVD, supple, symmetrical, trachea midline and thyroid not enlarged, symmetric, no tenderness/mass/nodules Lymph nodes: Cervical, supraclavicular, and axillary nodes normal. Resp: clear to auscultation bilaterally Back: symmetric, no curvature. ROM normal. No CVA tenderness. Cardio: regular rate and rhythm, S1, S2 normal, no murmur, click, rub or gallop GI: soft, non-tender; bowel sounds normal; no masses,  no organomegaly Extremities: extremities normal, atraumatic, no cyanosis or edema Neurologic: Alert and oriented X 3, normal strength and tone. Normal symmetric reflexes. Normal coordination and gait  ECOG PERFORMANCE STATUS: 1 - Symptomatic but completely ambulatory  Blood pressure 131/84, pulse 71, temperature 98.2 F (36.8 C), temperature source Oral, resp. rate 18, height 6' 2"  (1.88 m), weight 206  lb 6.4 oz (93.622 kg), SpO2 100 %.  LABORATORY DATA: Lab Results  Component Value Date   WBC 4.0 01/27/2016   HGB 12.7* 01/27/2016   HCT 38.7 01/27/2016   MCV 86.1 01/27/2016   PLT 158 01/27/2016      Chemistry      Component Value Date/Time   NA 141 01/27/2016 0928   K 4.2 01/27/2016 0928   CO2 26 01/27/2016 0928   BUN 6.3* 01/27/2016 0928   CREATININE 1.0 01/27/2016 0928      Component Value Date/Time   CALCIUM 8.4 01/27/2016 0928   ALKPHOS 167* 01/27/2016 0928   AST 26 01/27/2016 0928   ALT 28 01/27/2016 0928   BILITOT 0.34 01/27/2016 0928       RADIOGRAPHIC STUDIES: Ct Chest W Contrast  01/05/2016  CLINICAL DATA:  50 year old male with history of metastatic lung cancer to the liver, bones and brain. Originally diagnosed in July 2015. Radiation therapy complete. Oral chemotherapy in progress. EXAM: CT CHEST, ABDOMEN, AND PELVIS WITH CONTRAST TECHNIQUE: Multidetector CT imaging of the chest, abdomen and pelvis was performed following the standard protocol during bolus administration of intravenous contrast. CONTRAST:  169m ISOVUE-300 IOPAMIDOL (ISOVUE-300) INJECTION 61% COMPARISON:  CT of the chest,  abdomen and pelvis 08/22/2015. FINDINGS: CT CHEST FINDINGS Mediastinum/Lymph Nodes: Heart size is normal. There is no significant pericardial fluid, thickening or pericardial calcification. No pathologically enlarged mediastinal or hilar lymph nodes. Esophagus is unremarkable in appearance. No axillary lymphadenopathy. Lungs/Pleura: The previously noted pulmonary nodule in the inferior aspect of the right upper lobe (image 82 of series 4) is roughly stable in size measuring 16 x 12 mm (mean diameter of 14 mm) on today's study. However, there are now innumerable new pulmonary nodules scattered throughout all aspects of the lungs bilaterally, compatible with widespread metastatic disease to the lungs, the largest of which is a 2.1 x 1.6 x 1.2 cm nodule in the inferior aspect of the  lingula (image 106 of series 4 and sagittal image 136 of series 603). In addition, there are areas of interlobular septal thickening and nodularity which are most prevalent throughout the visualize lung bases, concerning for developing lymphangitic spread of disease. No confluent consolidative airspace disease. No pleural effusions. Musculoskeletal/Soft Tissues: Diffuse predominantly sclerotic lesions are again noted throughout the visualized axial and appendicular skeleton, compatible with widespread metastatic disease to the bones. This overall appears very similar to the prior examination. CT ABDOMEN AND PELVIS FINDINGS Hepatobiliary: There are several new sub cm hypovascular liver lesions which are incompletely characterized on today's examination due to their small size, but are new compared to prior examination 08/22/2015, concerning for metastatic disease. The largest of these lesions is in segment 4A (image 53 of series 2) measuring 7 mm. Pancreas: No pancreatic mass. No pancreatic ductal dilatation. No pancreatic or peripancreatic fluid or inflammatory changes. Spleen: Unremarkable. Adrenals/Urinary Tract: Sub cm low-attenuation lesion in the interpolar region of the right kidney is too small to definitively characterize, but is statistically likely a tiny cyst. Bilateral adrenal glands and the right kidney are normal in appearance. No hydroureteronephrosis. Urinary bladder is normal in appearance. Stomach/Bowel: The appearance of the stomach is normal. There is no pathologic dilatation of small bowel or colon. Normal appendix. Vascular/Lymphatic: No significant atherosclerotic disease, aneurysm or dissection identified in the abdominal or pelvic vasculature. No lymphadenopathy noted in the abdomen or pelvis. Reproductive: Prostate gland and seminal vesicles are unremarkable in appearance. Other: No significant volume of ascites.  No pneumoperitoneum. Musculoskeletal: Widespread predominantly sclerotic  lesions throughout all aspects of the visualized osseous and appendicular skeleton, similar to the prior examination, compatible with widespread metastatic disease to the bones. IMPRESSION: 1. Today's study demonstrates progression of disease. Specifically, while the previously described right upper lobe nodule is essentially stable in size, there are innumerable new and enlarging pulmonary nodules throughout the lungs bilaterally, compatible with widespread metastatic disease to the lungs, and there is evidence of developing lymphangitic spread of disease throughout the lung bases. In addition, there are several new hypovascular liver lesions which are too small to characterize, but are certainly suspicious for metastatic disease to the liver on today's examination. Widespread metastatic disease to the bones appears essentially unchanged compared to the prior examination. 2. Additional incidental findings, as above. Electronically Signed   By: Vinnie Langton M.D.   On: 01/05/2016 11:25   Ct Abdomen Pelvis W Contrast  01/05/2016  CLINICAL DATA:  50 year old male with history of metastatic lung cancer to the liver, bones and brain. Originally diagnosed in July 2015. Radiation therapy complete. Oral chemotherapy in progress. EXAM: CT CHEST, ABDOMEN, AND PELVIS WITH CONTRAST TECHNIQUE: Multidetector CT imaging of the chest, abdomen and pelvis was performed following the standard protocol during bolus administration of intravenous  contrast. CONTRAST:  1100m ISOVUE-300 IOPAMIDOL (ISOVUE-300) INJECTION 61% COMPARISON:  CT of the chest, abdomen and pelvis 08/22/2015. FINDINGS: CT CHEST FINDINGS Mediastinum/Lymph Nodes: Heart size is normal. There is no significant pericardial fluid, thickening or pericardial calcification. No pathologically enlarged mediastinal or hilar lymph nodes. Esophagus is unremarkable in appearance. No axillary lymphadenopathy. Lungs/Pleura: The previously noted pulmonary nodule in the inferior  aspect of the right upper lobe (image 82 of series 4) is roughly stable in size measuring 16 x 12 mm (mean diameter of 14 mm) on today's study. However, there are now innumerable new pulmonary nodules scattered throughout all aspects of the lungs bilaterally, compatible with widespread metastatic disease to the lungs, the largest of which is a 2.1 x 1.6 x 1.2 cm nodule in the inferior aspect of the lingula (image 106 of series 4 and sagittal image 136 of series 603). In addition, there are areas of interlobular septal thickening and nodularity which are most prevalent throughout the visualize lung bases, concerning for developing lymphangitic spread of disease. No confluent consolidative airspace disease. No pleural effusions. Musculoskeletal/Soft Tissues: Diffuse predominantly sclerotic lesions are again noted throughout the visualized axial and appendicular skeleton, compatible with widespread metastatic disease to the bones. This overall appears very similar to the prior examination. CT ABDOMEN AND PELVIS FINDINGS Hepatobiliary: There are several new sub cm hypovascular liver lesions which are incompletely characterized on today's examination due to their small size, but are new compared to prior examination 08/22/2015, concerning for metastatic disease. The largest of these lesions is in segment 4A (image 53 of series 2) measuring 7 mm. Pancreas: No pancreatic mass. No pancreatic ductal dilatation. No pancreatic or peripancreatic fluid or inflammatory changes. Spleen: Unremarkable. Adrenals/Urinary Tract: Sub cm low-attenuation lesion in the interpolar region of the right kidney is too small to definitively characterize, but is statistically likely a tiny cyst. Bilateral adrenal glands and the right kidney are normal in appearance. No hydroureteronephrosis. Urinary bladder is normal in appearance. Stomach/Bowel: The appearance of the stomach is normal. There is no pathologic dilatation of small bowel or colon.  Normal appendix. Vascular/Lymphatic: No significant atherosclerotic disease, aneurysm or dissection identified in the abdominal or pelvic vasculature. No lymphadenopathy noted in the abdomen or pelvis. Reproductive: Prostate gland and seminal vesicles are unremarkable in appearance. Other: No significant volume of ascites.  No pneumoperitoneum. Musculoskeletal: Widespread predominantly sclerotic lesions throughout all aspects of the visualized osseous and appendicular skeleton, similar to the prior examination, compatible with widespread metastatic disease to the bones. IMPRESSION: 1. Today's study demonstrates progression of disease. Specifically, while the previously described right upper lobe nodule is essentially stable in size, there are innumerable new and enlarging pulmonary nodules throughout the lungs bilaterally, compatible with widespread metastatic disease to the lungs, and there is evidence of developing lymphangitic spread of disease throughout the lung bases. In addition, there are several new hypovascular liver lesions which are too small to characterize, but are certainly suspicious for metastatic disease to the liver on today's examination. Widespread metastatic disease to the bones appears essentially unchanged compared to the prior examination. 2. Additional incidental findings, as above. Electronically Signed   By: DVinnie LangtonM.D.   On: 01/05/2016 11:25   ASSESSMENT AND PLAN: This is a very pleasant 50years old white male with [name], adenocarcinoma with positive EGFR mutation in exon 19 currently undergoing treatment with oral Gilotrif 40 mg by mouth daily status post 20 months and tolerating his treatment fairly well except for  the grade 1 skin rash and mild paronychia of the toes as well as mild diarrhea once daily. He takes Imodium at regular basis usually in the morning. Unfortunately the recent CT scan of the chest, abdomen and pelvis showed evidence  for disease progression with innumerable new and enlarging pulmonary nodules throughout the lungs bilaterally as well as new liver lesions. The plasma test for the EGFR mutation showed EGFR resistant mutation, T790M. He is currently undergoing treatment with Tagrisso 80 mg by mouth daily and tolerating the first week of his treatment well. 2-D Echo showed no concerning abnormalities with ejection fraction. I recommended for the patient to continue the treatment as scheduled. I would see him back for follow-up visit in 2 weeks for evaluation and management of any adverse effect of his treatment. He was advised to call immediately if he has any concerning symptoms in the interval. The patient voices understanding of current disease status and treatment options and is in agreement with the current care plan.  All questions were answered. The patient knows to call the clinic with any problems, questions or concerns. We can certainly see the patient much sooner if necessary.  Disclaimer: This note was dictated with voice recognition software. Similar sounding words can inadvertently be transcribed and may not be corrected upon review.

## 2016-01-27 NOTE — Telephone Encounter (Signed)
GAVE PT APT & AVS

## 2016-01-27 NOTE — Progress Notes (Signed)
Oncology Nurse Navigator Documentation  Oncology Nurse Navigator Flowsheets 01/27/2016  Navigator Encounter Type Clinic/MDC  Treatment Phase Treatment  Barriers/Navigation Needs No barriers at this time  Education Other  Interventions None required  Acuity Level 1  Time Spent with Patient 15   Spoke with patient today at [facility].  He is doing well.  His labs indicates his liver enzymes are improving.  He is very happy. No barriers identified at this time.

## 2016-01-27 NOTE — Progress Notes (Signed)
Patient called to inquire about his balance as he has copay assistance. The amount showing on the bill did not reflect the copay assistance. Gave patient true amount showing in self-pay. Patient wanted itemized statements so that he could use his flex card. Placed them in interoffice mail to patient along with the number to billing and [employer]'s and my business cards. Patient states he would make his payment online.

## 2016-01-28 ENCOUNTER — Telehealth: Payer: Self-pay | Admitting: *Deleted

## 2016-01-28 NOTE — Telephone Encounter (Signed)
Call from [facility] inquiring about a "change to [facility] for this patient.  Report patient called asking them to fill next order.  His insurance requires he use [facility]."   Verbal order with diagnosis code provided for [facility] refill as currently reads in 01-12-2016 order.  Spoke with patient conforming this information and instructed him when he is ready for next [facility]ill to [facility] who now have on order on file.  Thanked me for helping him.

## 2016-01-28 NOTE — Addendum Note (Signed)
Addended by: Lucile Crater on: 01/28/2016 11:25 AM   Modules accepted: Orders, Medications

## 2016-01-30 ENCOUNTER — Ambulatory Visit
Admission: RE | Admit: 2016-01-30 | Discharge: 2016-01-30 | Disposition: A | Discharge status: Home / Self Care | Payer: BLUE CROSS/BLUE SHIELD | Source: Ambulatory Visit | Attending: Radiation Oncology | Admitting: Radiation Oncology

## 2016-01-30 DIAGNOSIS — C7931 Secondary malignant neoplasm of brain: Secondary | ICD-10-CM

## 2016-01-30 DIAGNOSIS — C7949 Secondary malignant neoplasm of other parts of nervous system: Principal | ICD-10-CM

## 2016-01-30 MED ORDER — GADOBENATE DIMEGLUMINE 529 MG/ML IV SOLN
20.0000 mL | Freq: Once | INTRAVENOUS | Status: AC | PRN
  Administered 2016-01-30: 20 mL via INTRAVENOUS

## 2016-02-02 ENCOUNTER — Telehealth: Payer: Self-pay | Admitting: *Deleted

## 2016-02-02 NOTE — Telephone Encounter (Signed)
Oncology Nurse Navigator Documentation  Oncology Nurse Navigator Flowsheets 02/02/2016  Navigator Encounter Type Telephone  Telephone Outgoing Call  Treatment Phase Treatment  Barriers/Navigation Needs Education  Education Other  Interventions Education Method  Education Method Verbal  Acuity Level 2  Time Spent with Patient 30   I saw patient's wife in the [place] at [facility].  She states patient has 11 cavities.  I updated Dr. Julien Nordmann.  He asked that I call patient and stated he needs to be off Xgeva for 1 to 2 months before dental procedures that would expose the bone.  I called patient and explained.  He verbalized understanding.

## 2016-02-05 ENCOUNTER — Ambulatory Visit
Admission: RE | Admit: 2016-02-05 | Discharge: 2016-02-05 | Disposition: A | Discharge status: Home / Self Care | Payer: BLUE CROSS/BLUE SHIELD | Source: Ambulatory Visit | Attending: Radiation Oncology | Admitting: Radiation Oncology

## 2016-02-05 VITALS — BP 149/96 | HR 70 | Resp 16 | Wt 207.1 lb

## 2016-02-05 DIAGNOSIS — C7931 Secondary malignant neoplasm of brain: Secondary | ICD-10-CM | POA: Insufficient documentation

## 2016-02-05 DIAGNOSIS — C7949 Secondary malignant neoplasm of other parts of nervous system: Secondary | ICD-10-CM | POA: Insufficient documentation

## 2016-02-05 NOTE — Progress Notes (Signed)
Radiation Oncology         [phone]   Name: Oscar Floyd   Date: 02/05/2016   MRN: [id number]  DOB: 1966/03/19    Multidisciplinary Brain and Spine Oncology Clinic Follow-Up Visit Note  CC: [name], MD  [name], MD    ICD-9-CM ICD-10-CM   1. Numerous sub-centimeter brain metastases 198.3 C79.31     C79.49     Diagnosis:  50 y.o. 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 CT scan of the chest/abd/pelvis on 01/05/16 demonstrated progression of disease in the lungs and liver. He is now taking Tagrisso instead of Gilotrif (started on 01/15/16). MRI of the brain on 01/30/16 did not show new or recurrent brain metastases.  ROS: He reports less symptoms now with the change of medication. Reports occasional diarrhea related to the effects of chemo pill. Denies headache, dizziness, nausea, vomiting, cough, or shortness of breath. Patient without any additional complaints. States, "I actually feel really well."  ALLERGIES:  has No Known Allergies.  Meds: Current Outpatient Prescriptions  Medication Sig Dispense Refill  . ALPRAZolam (XANAX) 1 MG tablet Reported on 01/12/2016    . ALTABAX 1 % ointment     . calcium-vitamin D (OSCAL) 250-125 MG-UNIT per tablet Take 1 tablet by mouth daily. Reported on 01/12/2016    . diazepam (VALIUM) 10 MG tablet Take 1 tablet (10 mg total) by mouth as directed. 30 min before MRI 15 tablet 0  . esomeprazole (NEXIUM) 20 MG capsule Take by mouth 2 (two) times daily before a meal.     . loperamide (IMODIUM) 2 MG capsule Take 2 mg by mouth as needed for diarrhea or loose stools.    . magic mouthwash w/lidocaine SOLN Take 5 mLs by mouth 4 (four) times daily as needed for mouth pain (Duke's formula  w/ lidocaine, nystatin 1:1.). 240 mL 1  . mirtazapine (REMERON) 30 MG tablet Take 1 tablet (30 mg total) by mouth at bedtime. 30 tablet 2  . naproxen sodium (ANAPROX) 220 MG tablet Take 220 mg by mouth as needed.    . NEOMYCIN-POLYMYXIN-HYDROCORTISONE (CORTISPORIN) 1 % SOLN otic solution Apply 1-2 drops to toe BID after soaking 10 mL 1  . neomycin-polymyxin-hydrocortisone (CORTISPORIN) 3.5-10000-1 otic suspension APPLY 1-2 DROPS TO TOE TWICE A DAY AFTER SOAKING  1  . neomycin-polymyxin-hydrocortisone (CORTISPORIN) otic solution Apply one to two drops to toe after soaking twice daily. 10 mL 0  . osimertinib mesylate (TAGRISSO) 80 MG tablet Take 1 tablet (80 mg total) by mouth daily. 30 tablet 2  . valACYclovir (VALTREX) 1000 MG tablet Take 0.5 tablets (500 mg total) by mouth 3 (three) times daily. 21 tablet 0   No current facility-administered medications for this encounter.    Physical Findings: The patient is in no acute distress. Patient is alert and oriented.  weight is 207 lb 1.6 oz (93.94 kg). His blood pressure is 149/96 and his pulse is 70. His respiration is 16 and oxygen saturation is 100%.   Neuro intact  Lab Findings: Lab Results  Component Value Date   WBC 4.0 01/27/2016   HGB 12.7* 01/27/2016   HCT 38.7 01/27/2016   MCV 86.1 01/27/2016   PLT 158 01/27/2016    Radiographic Findings: Mr [name]  Wo Contrast  01/30/2016  CLINICAL DATA:  Secondary malignant neoplasm of brain. Metastatic non-small cell with whole-brain radiotherapy. Followup. EXAM: MRI HEAD WITHOUT AND WITH CONTRAST TECHNIQUE: Multiplanar, multiecho pulse sequences of the brain and surrounding structures were obtained without and with intravenous contrast. CONTRAST:  28m MULTIHANCE GADOBENATE DIMEGLUMINE 529 MG/ML IV SOLN COMPARISON:  11/20/2015 FINDINGS: Calvarium and upper cervical spine: Clival, right occipital condyle, and upper cervical (most notably left C1 lateral mass) metastases. No convincing change  from prior. Orbits: No evidence of metastasis. Sinuses and Mastoids: Mild chronic left maxillary sinusitis. Brain: No enhancing metastasis is seen intracranially. No signal abnormality or mass effect. No visible leukoencephalopathy from radiation. No interval infarct, hemorrhage, hydrocephalus, or major vessel occlusion. IMPRESSION: 1. Negative intracranial imaging. No new or recurrent brain metastasis. 2.  Known osseous metastatic disease. Electronically Signed   By: JMonte FantasiaM.D.   On: 01/30/2016 11:54   Impression:  The patient has no evidence of progressive or recurrent brain metastases at this time, but disease progression noted on the CT scan on 01/05/16 and EGFR Mutation Analysis on 01/06/16 demonstrated he has developed a EGFR T790M resistant mutation in April 2017.  Plan:  Repeat MRI and follow up in 3 months.  _____________________________________ ------------------------------------------------   MTyler Pita MD CEl PasoDirector and Director of Stereotactic Radiosurgery Direct Dial: [phone] Fax: [phone]  Skype  LinkedIn  This document serves as a record of services personally performed by AShona Simpson PA-C and MTyler Pita MD. It was created on their behalf by JDarcus Austin a trained medical scribe. The creation of this record is based on the scribe's personal observations and the providers' statements to them. This document has been checked and approved by the attending provider.

## 2016-02-05 NOTE — Progress Notes (Signed)
Weight and vitals stable. Taking Tagrisso now instead of Gilotrif. Reports less symptoms associated with tagrisso than gilotrif. Reports occasional diarrhea related to effects of chemo pill. Denies headache, dizziness, nausea, vomiting, cough or shortness of breath. Patient without complaints. States, "I actually feel really well."  BP 149/96 mmHg  Pulse 70  Resp 16  Wt 207 lb 1.6 oz (93.94 kg)  SpO2 100% Wt Readings from Last 3 Encounters:  02/05/16 207 lb 1.6 oz (93.94 kg)  01/27/16 206 lb 6.4 oz (93.622 kg)  01/13/16 203 lb 3.2 oz (92.171 kg)

## 2016-02-10 ENCOUNTER — Other Ambulatory Visit: Payer: BLUE CROSS/BLUE SHIELD

## 2016-02-10 NOTE — Addendum Note (Signed)
Encounter addended by: Heywood Footman, RN on: 02/10/2016 10:09 AM<BR>     Documentation filed: Charges VN

## 2016-02-10 NOTE — Addendum Note (Signed)
Encounter addended by: Heywood Footman, RN on: 02/10/2016 10:23 AM<BR>     Documentation filed: Charges VN

## 2016-02-11 ENCOUNTER — Encounter: Payer: Self-pay | Admitting: Pharmacist

## 2016-02-11 ENCOUNTER — Ambulatory Visit (HOSPITAL_BASED_OUTPATIENT_CLINIC_OR_DEPARTMENT_OTHER): Payer: BLUE CROSS/BLUE SHIELD | Admitting: Nurse Practitioner

## 2016-02-11 ENCOUNTER — Other Ambulatory Visit (HOSPITAL_BASED_OUTPATIENT_CLINIC_OR_DEPARTMENT_OTHER): Payer: BLUE CROSS/BLUE SHIELD

## 2016-02-11 VITALS — BP 132/84 | HR 64 | Temp 98.3°F | Resp 18 | Ht 74.0 in | Wt 205.0 lb

## 2016-02-11 DIAGNOSIS — C7951 Secondary malignant neoplasm of bone: Secondary | ICD-10-CM

## 2016-02-11 DIAGNOSIS — L03039 Cellulitis of unspecified toe: Secondary | ICD-10-CM

## 2016-02-11 DIAGNOSIS — C3491 Malignant neoplasm of unspecified part of right bronchus or lung: Secondary | ICD-10-CM

## 2016-02-11 DIAGNOSIS — C3411 Malignant neoplasm of upper lobe, right bronchus or lung: Secondary | ICD-10-CM | POA: Diagnosis not present

## 2016-02-11 DIAGNOSIS — R197 Diarrhea, unspecified: Secondary | ICD-10-CM

## 2016-02-11 DIAGNOSIS — Z5111 Encounter for antineoplastic chemotherapy: Secondary | ICD-10-CM

## 2016-02-11 DIAGNOSIS — C7931 Secondary malignant neoplasm of brain: Secondary | ICD-10-CM

## 2016-02-11 DIAGNOSIS — C787 Secondary malignant neoplasm of liver and intrahepatic bile duct: Secondary | ICD-10-CM

## 2016-02-11 DIAGNOSIS — C3490 Malignant neoplasm of unspecified part of unspecified bronchus or lung: Secondary | ICD-10-CM

## 2016-02-11 DIAGNOSIS — L270 Generalized skin eruption due to drugs and medicaments taken internally: Secondary | ICD-10-CM

## 2016-02-11 LAB — COMPREHENSIVE METABOLIC PANEL WITH GFR
ALT: 17 U/L (ref 0–55)
AST: 23 U/L (ref 5–34)
Albumin: 3.7 g/dL (ref 3.5–5.0)
Alkaline Phosphatase: 156 U/L — ABNORMAL HIGH (ref 40–150)
Anion Gap: 8 meq/L (ref 3–11)
BUN: 9.4 mg/dL (ref 7.0–26.0)
CO2: 25 meq/L (ref 22–29)
Calcium: 8.7 mg/dL (ref 8.4–10.4)
Chloride: 108 meq/L (ref 98–109)
Creatinine: 1.2 mg/dL (ref 0.7–1.3)
EGFR: 71 ml/min/1.73 m2 — ABNORMAL LOW
Glucose: 67 mg/dL — ABNORMAL LOW (ref 70–140)
Potassium: 4.3 meq/L (ref 3.5–5.1)
Sodium: 140 meq/L (ref 136–145)
Total Bilirubin: 0.64 mg/dL (ref 0.20–1.20)
Total Protein: 6.9 g/dL (ref 6.4–8.3)

## 2016-02-11 LAB — CBC WITH DIFFERENTIAL/PLATELET
BASO%: 0.6 % (ref 0.0–2.0)
BASOS ABS: 0 10*3/uL (ref 0.0–0.1)
EOS ABS: 0.1 10*3/uL (ref 0.0–0.5)
EOS%: 3.6 % (ref 0.0–7.0)
HEMATOCRIT: 41.7 % (ref 38.4–49.9)
HGB: 13.5 g/dL (ref 13.0–17.1)
LYMPH#: 1 10*3/uL (ref 0.9–3.3)
LYMPH%: 24 % (ref 14.0–49.0)
MCH: 28.3 pg (ref 27.2–33.4)
MCHC: 32.5 g/dL (ref 32.0–36.0)
MCV: 87.2 fL (ref 79.3–98.0)
MONO#: 0.4 10*3/uL (ref 0.1–0.9)
MONO%: 9.8 % (ref 0.0–14.0)
NEUT#: 2.6 10*3/uL (ref 1.5–6.5)
NEUT%: 62 % (ref 39.0–75.0)
Platelets: 162 10*3/uL (ref 140–400)
RBC: 4.79 10*6/uL (ref 4.20–5.82)
RDW: 15.3 % — ABNORMAL HIGH (ref 11.0–14.6)
WBC: 4.2 10*3/uL (ref 4.0–10.3)

## 2016-02-11 NOTE — Progress Notes (Signed)
  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 He had disease progression and development of EGFR T790M resistant mutation in April 2017. Primary site: Lung (Right)  Staging method: AJCC 7th Edition  Clinical: Stage IV (T2a, N0, M1b) signed by [name], MD on 03/26/2014 4:57 PM  Summary: Stage IV (T2a, N0, M1b)   PRIOR THERAPY:  1) Status post whole brain irradiation under the care Dr. Tammi Klippel completed 04/12/2014. 2) Gilotrif 40 mg po daily - therapy beginning 04/03/2014. Status post approximately 20 months of therapy discontinued secondary to disease progression and development of EGFR T790M resistant mutation.   CURRENT THERAPY:  1) Tagrisso 80 mg by mouth daily. First dose started 01/15/2016.  2) Xgeva 120 mcg subcutaneously on monthly basis.   INTERVAL HISTORY:   Mr. Neels returns as scheduled. He continues Tagrisso. He denies nausea/vomiting. No mouth sores. He has occasional loose stools. No frank diarrhea. No rash. No ocular symptoms. He denies pain. He reports a good appetite. He overall feels well. He reports he has multiple cavities with fillings planned.  Objective:  Vital signs in last 24 hours:  Blood pressure 132/84, pulse 64, temperature 98.3 F (36.8 C), temperature source Oral, resp. rate 18, height _0  (1.88 m), weight 205 lb (92.987 kg), SpO2 100 %.    HEENT: No thrush or ulcers. Lymphatics: No palpable cervical or supra clavicular lymph nodes. Resp: Lungs clear bilaterally. Cardio: Regular rate and rhythm. GI: Abdomen soft and nontender. No hepatomegaly. Vascular: No leg edema. Skin: No rash.    Lab Results:  Lab Results  Component Value  Date   WBC 4.2 02/11/2016   HGB 13.5 02/11/2016   HCT 41.7 02/11/2016   MCV 87.2 02/11/2016   PLT 162 02/11/2016   NEUTROABS 2.6 02/11/2016    Imaging:  No results found.  Medications: I have reviewed the patient's current medications.  Assessment/Plan: 1. [name], adenocarcinoma with positive EGFR mutation in exon 19 status post treatment with oral Gilotrif 40 mg by mouth daily x 20 months, overall tolerated well except for grade 1 skin rash, mild paronychia of the toes and mild diarrhea once daily. Restaging CT scan of the chest, abdomen and pelvis 01/05/2016 showed evidence for disease progression with innumerable new and enlarging pulmonary nodules throughout the lungs bilaterally as well as new liver lesions. EGFR mutation showed EGFR resistant mutation, T790M. He began treatment with Tagrisso 80 mg by mouth daily 01/15/2016 and appears to be tolerating well. 2. Dental issues. Multiple fillings are planned. Last Delton See 01/01/2016.   Disposition: Mr. Gunnels appears stable. He will continue Tagrisso.   We discussed dental work in the setting of [place]. We have recommended the dental work be placed on hold until about 2 months after the most recent dose of Xgeva. He is in agreement with this plan. He reports his dentist is aware he is on [place].  He will return for a follow-up visit in 2 weeks. He will contact the office in the interim with any problems.  Plan reviewed with Dr. Julien Nordmann.    Ned Card ANP/GNP-BC   02/11/2016  3:02 PM

## 2016-02-11 NOTE — Progress Notes (Signed)
Oral Chemotherapy Follow-Up Form  Original Start date of oral chemotherapy: 01/15/16  Saw patient today to follow up regarding patient's oral chemotherapy medication: Tagrisso Pt is on 80 mg/day currently.  He takes first thing in the AM.  Pt is doing well today.  He was seen today by Ned Card, NP.  He is tolerating Tagrisso very well compared to [place].  Pt reports the following side effects:  Mild diarrhea - occurs approx every other day.  Does not require Imodium.  No SOB/edema/skin/nail problems. HR = 64.  Last EKG, QTc interval = 438 on 01/13/16 before starting Tagrisso.  EF wnl at baseline.  Dr. Julien Nordmann will get EKG if bradycardia develops.  Delton See on hold currently for dental work.  Will likely resume Xgeva in June.  Will follow up and call patient again in 4 weeks.  Thank you, Kennith Center, Pharm.D., CPP 02/11/2016'@3'$ :55 PM Oral Chemotherapy Clinic

## 2016-02-12 ENCOUNTER — Ambulatory Visit: Payer: Self-pay | Admitting: Radiation Oncology

## 2016-02-24 ENCOUNTER — Other Ambulatory Visit: Payer: Self-pay | Admitting: Medical Oncology

## 2016-02-24 DIAGNOSIS — C3491 Malignant neoplasm of unspecified part of right bronchus or lung: Secondary | ICD-10-CM

## 2016-02-25 ENCOUNTER — Encounter: Payer: Self-pay | Admitting: Internal Medicine

## 2016-02-25 ENCOUNTER — Ambulatory Visit (HOSPITAL_BASED_OUTPATIENT_CLINIC_OR_DEPARTMENT_OTHER): Payer: BLUE CROSS/BLUE SHIELD | Admitting: Internal Medicine

## 2016-02-25 ENCOUNTER — Other Ambulatory Visit (HOSPITAL_BASED_OUTPATIENT_CLINIC_OR_DEPARTMENT_OTHER): Payer: BLUE CROSS/BLUE SHIELD

## 2016-02-25 ENCOUNTER — Telehealth: Payer: Self-pay | Admitting: Internal Medicine

## 2016-02-25 VITALS — BP 120/75 | HR 67 | Temp 97.9°F | Resp 18 | Ht 74.0 in | Wt 206.8 lb

## 2016-02-25 DIAGNOSIS — L270 Generalized skin eruption due to drugs and medicaments taken internally: Secondary | ICD-10-CM

## 2016-02-25 DIAGNOSIS — C3411 Malignant neoplasm of upper lobe, right bronchus or lung: Secondary | ICD-10-CM

## 2016-02-25 DIAGNOSIS — Z5111 Encounter for antineoplastic chemotherapy: Secondary | ICD-10-CM

## 2016-02-25 DIAGNOSIS — C7931 Secondary malignant neoplasm of brain: Secondary | ICD-10-CM

## 2016-02-25 DIAGNOSIS — C3491 Malignant neoplasm of unspecified part of right bronchus or lung: Secondary | ICD-10-CM

## 2016-02-25 DIAGNOSIS — R197 Diarrhea, unspecified: Secondary | ICD-10-CM

## 2016-02-25 DIAGNOSIS — C7951 Secondary malignant neoplasm of bone: Secondary | ICD-10-CM

## 2016-02-25 DIAGNOSIS — L03039 Cellulitis of unspecified toe: Secondary | ICD-10-CM

## 2016-02-25 DIAGNOSIS — C787 Secondary malignant neoplasm of liver and intrahepatic bile duct: Secondary | ICD-10-CM

## 2016-02-25 LAB — COMPREHENSIVE METABOLIC PANEL
ALBUMIN: 3.6 g/dL (ref 3.5–5.0)
ALK PHOS: 98 U/L (ref 40–150)
ALT: 16 U/L (ref 0–55)
ANION GAP: 7 meq/L (ref 3–11)
AST: 22 U/L (ref 5–34)
BILIRUBIN TOTAL: 0.56 mg/dL (ref 0.20–1.20)
BUN: 10 mg/dL (ref 7.0–26.0)
CO2: 27 mEq/L (ref 22–29)
Calcium: 9.1 mg/dL (ref 8.4–10.4)
Chloride: 107 mEq/L (ref 98–109)
Creatinine: 1.1 mg/dL (ref 0.7–1.3)
EGFR: 77 mL/min/{1.73_m2} — AB (ref >90)
Glucose: 77 mg/dl (ref 70–140)
POTASSIUM: 4 meq/L (ref 3.5–5.1)
Sodium: 140 mEq/L (ref 136–145)
TOTAL PROTEIN: 6.8 g/dL (ref 6.4–8.3)

## 2016-02-25 LAB — CBC WITH DIFFERENTIAL/PLATELET
BASO%: 0.2 % (ref 0.0–2.0)
BASOS ABS: 0 10*3/uL (ref 0.0–0.1)
EOS ABS: 0.1 10*3/uL (ref 0.0–0.5)
EOS%: 2.6 % (ref 0.0–7.0)
HEMATOCRIT: 41.5 % (ref 38.4–49.9)
HEMOGLOBIN: 13.7 g/dL (ref 13.0–17.1)
LYMPH#: 1 10*3/uL (ref 0.9–3.3)
LYMPH%: 23.1 % (ref 14.0–49.0)
MCH: 29 pg (ref 27.2–33.4)
MCHC: 33 g/dL (ref 32.0–36.0)
MCV: 87.9 fL (ref 79.3–98.0)
MONO#: 0.4 10*3/uL (ref 0.1–0.9)
MONO%: 9.4 % (ref 0.0–14.0)
NEUT#: 2.7 10*3/uL (ref 1.5–6.5)
NEUT%: 64.7 % (ref 39.0–75.0)
PLATELETS: 143 10*3/uL (ref 140–400)
RBC: 4.72 10*6/uL (ref 4.20–5.82)
RDW: 14.5 % (ref 11.0–14.6)
WBC: 4.2 10*3/uL (ref 4.0–10.3)

## 2016-02-25 NOTE — Telephone Encounter (Signed)
Gave pt apt & avs °

## 2016-02-25 NOTE — Progress Notes (Signed)
Oscar Floyd [phone]   Fax:([phone]  OFFICE PROGRESS NOTE  [name], MD [address] [place] [address]  DIAGNOSIS: stage IV (T2a, N0, M1b) non-small cell lung cancer consistent with poorly differentiated adenocarcinoma with positive EGFR mutation with deletion in exon 19, diagnosed in July of 2015 and presented with right upper lobe lung mass in addition to extensive liver, brain and bone metastases.  He had disease progression and development of EGFR T790M resistant mutation in April 2017. Primary site: Lung (Right)  Staging method: AJCC 7th Edition  Clinical: Stage IV (T2a, N0, M1b) signed by [name], MD on 03/26/2014 4:57 PM  Summary: Stage IV (T2a, N0, M1b)   PRIOR THERAPY:  1) Status post whole brain irradiation under the care Dr. Tammi Klippel completed 04/12/2014. 2) Gilotrif 40 mg po daily - therapy beginning 04/03/2014. Status post approximately 20 months of therapy discontinued secondary to disease progression and development of EGFR T790M resistant mutation.   CURRENT THERAPY:  1) Tagrisso 80 mg by mouth daily. First dose started 01/15/2016.   2) Xgeva 120 mcg subcutaneously on monthly basis.   DISEASE STAGE:  Lung cancer  Primary site: Lung (Right)  Staging method: AJCC 7th Edition  Clinical: Stage IV (T2a, N0, M1b) signed by [name], MD on 03/26/2014 4:57 PM  Summary: Stage IV (T2a, N0, M1b)  CHEMOTHERAPY INTENT: palliative  CURRENT # OF CHEMOTHERAPY CYCLES: 2 CURRENT ANTIEMETICS: none  CURRENT SMOKING STATUS: non-smoker/never smoker  ORAL CHEMOTHERAPY AND CONSENT: yes- Gilotrif, Tagrisso  CURRENT BISPHOSPHONATES USE: None  PAIN MANAGEMENT: none  NARCOTICS INDUCED CONSTIPATION: None  LIVING WILL AND CODE STATUS:   INTERVAL HISTORY: Oscar Floyd 50 y.o. male returns to the clinic today for followup visit. The patient is feeling fine today with no specific complaints. He started treatment  with Tagrisso on 01/15/2016 and tolerating his treatment well. The side effects on this treatment are unremarkable compared to the previous treatment with Gilotrif. He has less diarrhea and skin rash. He denied having any significant chest pain, shortness breath, cough or hemoptysis. He denied having any weight loss or night sweats. He has no nausea or vomiting, no fever or chills. He had repeat CBC and complaints metabolic panel performed earlier today and he is here for evaluation and discussion of his lab results.  MEDICAL HISTORY: Past Medical History  Diagnosis Date  . Hypertension   . Lung cancer ([place])     RUL lung with mets to liver, bone and brain  . Bone metastases ([place]) 08/26/2015  . Encounter for antineoplastic chemotherapy 01/06/2016    ALLERGIES:  has No Known Allergies.  MEDICATIONS:  Current Outpatient Prescriptions  Medication Sig Dispense Refill  . ALPRAZolam (XANAX) 1 MG tablet Reported on 02/11/2016    . calcium-vitamin D (OSCAL) 250-125 MG-UNIT per tablet Take 1 tablet by mouth daily. Reported on 01/12/2016    . diazepam (VALIUM) 10 MG tablet Take 1 tablet (10 mg total) by mouth as directed. 30 min before MRI 15 tablet 0  . esomeprazole (NEXIUM) 20 MG capsule Take by mouth 2 (two) times daily before a meal.     . magic mouthwash w/lidocaine SOLN Take 5 mLs by mouth 4 (four) times daily as needed for mouth pain ([employer]'s formula w/ lidocaine, nystatin 1:1.). 240 mL 1  . mirtazapine (REMERON) 30 MG tablet Take 1 tablet (30 mg total) by mouth at bedtime. 30 tablet 2  . naproxen sodium (ANAPROX) 220  MG tablet Take 220 mg by mouth as needed.    Marland Kitchen osimertinib mesylate (TAGRISSO) 80 MG tablet Take 1 tablet (80 mg total) by mouth daily. 30 tablet 2  . loperamide (IMODIUM) 2 MG capsule Take 2 mg by mouth as needed for diarrhea or loose stools.     No current facility-administered medications for this visit.    SURGICAL HISTORY:  Past Surgical History  Procedure Laterality Date    . Vasectomy    . Video bronchoscopy Bilateral 03/20/2014    Procedure: VIDEO BRONCHOSCOPY WITH FLUORO;  Surgeon: Rigoberto Noel, MD;  Location: [facility];  Service: Cardiopulmonary;  Laterality: Bilateral;    REVIEW OF SYSTEMS:  A comprehensive review of systems was negative.   PHYSICAL EXAMINATION: General appearance: alert, cooperative and no distress Head: Normocephalic, without obvious abnormality, atraumatic Neck: no adenopathy, no JVD, supple, symmetrical, trachea midline and thyroid not enlarged, symmetric, no tenderness/mass/nodules Lymph nodes: Cervical, supraclavicular, and axillary nodes normal. Resp: clear to auscultation bilaterally Back: symmetric, no curvature. ROM normal. No CVA tenderness. Cardio: regular rate and rhythm, S1, S2 normal, no murmur, click, rub or gallop GI: soft, non-tender; bowel sounds normal; no masses,  no organomegaly Extremities: extremities normal, atraumatic, no cyanosis or edema Neurologic: Alert and oriented X 3, normal strength and tone. Normal symmetric reflexes. Normal coordination and gait  ECOG PERFORMANCE STATUS: 1 - Symptomatic but completely ambulatory  Blood pressure 120/75, pulse 67, temperature 97.9 F (36.6 C), temperature source Oral, resp. rate 18, height 6' 2"  (1.88 m), weight 206 lb 12.8 oz (93.804 kg), SpO2 100 %.  LABORATORY DATA: Lab Results  Component Value Date   WBC 4.2 02/25/2016   HGB 13.7 02/25/2016   HCT 41.5 02/25/2016   MCV 87.9 02/25/2016   PLT 143 02/25/2016      Chemistry      Component Value Date/Time   NA 140 02/11/2016 1439   K 4.3 02/11/2016 1439   CO2 25 02/11/2016 1439   BUN 9.4 02/11/2016 1439   CREATININE 1.2 02/11/2016 1439      Component Value Date/Time   CALCIUM 8.7 02/11/2016 1439   ALKPHOS 156* 02/11/2016 1439   AST 23 02/11/2016 1439   ALT 17 02/11/2016 1439   BILITOT 0.64 02/11/2016 1439       RADIOGRAPHIC STUDIES: Mr [name] Wo Contrast  01-Feb-2016  CLINICAL DATA:   Secondary malignant neoplasm of brain. Metastatic non-small cell with whole-brain radiotherapy. Followup. EXAM: MRI HEAD WITHOUT AND WITH CONTRAST TECHNIQUE: Multiplanar, multiecho pulse sequences of the brain and surrounding structures were obtained without and with intravenous contrast. CONTRAST:  42m MULTIHANCE GADOBENATE DIMEGLUMINE 529 MG/ML IV SOLN COMPARISON:  11/20/2015 FINDINGS: Calvarium and upper cervical spine: Clival, right occipital condyle, and upper cervical (most notably left C1 lateral mass) metastases. No convincing change from prior. Orbits: No evidence of metastasis. Sinuses and Mastoids: Mild chronic left maxillary sinusitis. Brain: No enhancing metastasis is seen intracranially. No signal abnormality or mass effect. No visible leukoencephalopathy from radiation. No interval infarct, hemorrhage, hydrocephalus, or major vessel occlusion. IMPRESSION: 1. Negative intracranial imaging. No new or recurrent brain metastasis. 2.  Known osseous metastatic disease. Electronically Signed   By: JMonte FantasiaM.D.   On: 005/21/1711:54   ASSESSMENT AND PLAN: This is a very pleasant 50years old white male with [name], adenocarcinoma with positive EGFR mutation in exon 19 who was treated with oral Gilotrif 40 mg by mouth daily status post 20 months and  tolerating his treatment fairly well except for the grade 1 skin rash and mild paronychia of the toes as well as mild diarrhea once daily. He takes Imodium at regular basis usually in the morning. Unfortunately the recent CT scan of the chest, abdomen and pelvis showed evidence for disease progression with innumerable new and enlarging pulmonary nodules throughout the lungs bilaterally as well as new liver lesions. The plasma test for the EGFR mutation showed EGFR resistant mutation, T790M. He is currently undergoing treatment with Tagrisso 80 mg by mouth daily since 01/15/2016 and tolerating his treatment well. I  recommended for the patient to continue the treatment as scheduled. I would see him back for follow-up visit in 5 weeks for evaluation with repeat CT scan of the chest, abdomen and pelvis for restaging of his disease.  He was advised to call immediately if he has any concerning symptoms in the interval. The patient voices understanding of current disease status and treatment options and is in agreement with the current care plan.  All questions were answered. The patient knows to call the clinic with any problems, questions or concerns. We can certainly see the patient much sooner if necessary.  Disclaimer: This note was dictated with voice recognition software. Similar sounding words can inadvertently be transcribed and may not be corrected upon review.

## 2016-03-01 ENCOUNTER — Other Ambulatory Visit: Payer: Self-pay | Admitting: Internal Medicine

## 2016-03-19 ENCOUNTER — Ambulatory Visit (HOSPITAL_COMMUNITY)
Admission: RE | Admit: 2016-03-19 | Discharge: 2016-03-19 | Disposition: A | Discharge status: Home / Self Care | Payer: BLUE CROSS/BLUE SHIELD | Source: Ambulatory Visit | Attending: Internal Medicine | Admitting: Internal Medicine

## 2016-03-19 ENCOUNTER — Encounter (HOSPITAL_COMMUNITY): Payer: Self-pay

## 2016-03-19 DIAGNOSIS — Z5111 Encounter for antineoplastic chemotherapy: Secondary | ICD-10-CM

## 2016-03-19 DIAGNOSIS — K769 Liver disease, unspecified: Secondary | ICD-10-CM | POA: Insufficient documentation

## 2016-03-19 DIAGNOSIS — C7951 Secondary malignant neoplasm of bone: Secondary | ICD-10-CM | POA: Insufficient documentation

## 2016-03-19 DIAGNOSIS — C3491 Malignant neoplasm of unspecified part of right bronchus or lung: Secondary | ICD-10-CM | POA: Diagnosis present

## 2016-03-19 DIAGNOSIS — C7800 Secondary malignant neoplasm of unspecified lung: Secondary | ICD-10-CM | POA: Diagnosis not present

## 2016-03-19 MED ORDER — IOPAMIDOL (ISOVUE-300) INJECTION 61%
100.0000 mL | Freq: Once | INTRAVENOUS | Status: AC | PRN
  Administered 2016-03-19: 100 mL via INTRAVENOUS

## 2016-03-19 MED ORDER — DIATRIZOATE MEGLUMINE & SODIUM 66-10 % PO SOLN
30.0000 mL | Freq: Once | ORAL | Status: AC
  Administered 2016-03-19: 30 mL via ORAL

## 2016-03-22 ENCOUNTER — Telehealth: Payer: Self-pay | Admitting: *Deleted

## 2016-03-22 ENCOUNTER — Encounter: Payer: Self-pay | Admitting: *Deleted

## 2016-03-22 NOTE — Telephone Encounter (Signed)
Oncology Nurse Navigator Documentation  Oncology Nurse Navigator Flowsheets 03/22/2016  Navigator Encounter Type Telephone  Telephone Outgoing Call  Treatment Phase Treatment  Acuity Level 1  Time Spent with Patient 15   I received message from Diane, RN patient had called and would like to speak with me.  I called and left a vm message to call.

## 2016-03-22 NOTE — Progress Notes (Signed)
Oscar Floyd called back.  I spoke with him.  He is inquiring about his recent CT Chest.  Per Dr. Julien Nordmann I gave him results.  He is so happy with treatment and results.  He has a follow up appt on 03/29/16 and I reminded him of appt.  He was thankful for the update.

## 2016-03-26 ENCOUNTER — Other Ambulatory Visit (HOSPITAL_BASED_OUTPATIENT_CLINIC_OR_DEPARTMENT_OTHER): Payer: BLUE CROSS/BLUE SHIELD

## 2016-03-26 DIAGNOSIS — C7951 Secondary malignant neoplasm of bone: Secondary | ICD-10-CM

## 2016-03-26 DIAGNOSIS — C3491 Malignant neoplasm of unspecified part of right bronchus or lung: Secondary | ICD-10-CM

## 2016-03-26 DIAGNOSIS — Z5111 Encounter for antineoplastic chemotherapy: Secondary | ICD-10-CM

## 2016-03-26 DIAGNOSIS — C3411 Malignant neoplasm of upper lobe, right bronchus or lung: Secondary | ICD-10-CM | POA: Diagnosis not present

## 2016-03-26 LAB — COMPREHENSIVE METABOLIC PANEL
ALT: 23 U/L (ref 0–55)
AST: 30 U/L (ref 5–34)
Albumin: 3.6 g/dL (ref 3.5–5.0)
Alkaline Phosphatase: 79 U/L (ref 40–150)
Anion Gap: 9 mEq/L (ref 3–11)
BUN: 10.8 mg/dL (ref 7.0–26.0)
CALCIUM: 8.8 mg/dL (ref 8.4–10.4)
CHLORIDE: 108 meq/L (ref 98–109)
CO2: 24 meq/L (ref 22–29)
CREATININE: 1.2 mg/dL (ref 0.7–1.3)
EGFR: 72 mL/min/{1.73_m2} — ABNORMAL LOW (ref >90)
GLUCOSE: 103 mg/dL (ref 70–140)
Potassium: 4.4 mEq/L (ref 3.5–5.1)
SODIUM: 141 meq/L (ref 136–145)
Total Bilirubin: 0.58 mg/dL (ref 0.20–1.20)
Total Protein: 6.9 g/dL (ref 6.4–8.3)

## 2016-03-26 LAB — CBC WITH DIFFERENTIAL/PLATELET
BASO%: 0.6 % (ref 0.0–2.0)
Basophils Absolute: 0 10*3/uL (ref 0.0–0.1)
EOS%: 3.5 % (ref 0.0–7.0)
Eosinophils Absolute: 0.2 10*3/uL (ref 0.0–0.5)
HEMATOCRIT: 42.4 % (ref 38.4–49.9)
HGB: 14 g/dL (ref 13.0–17.1)
LYMPH#: 1 10*3/uL (ref 0.9–3.3)
LYMPH%: 22.4 % (ref 14.0–49.0)
MCH: 28.6 pg (ref 27.2–33.4)
MCHC: 33 g/dL (ref 32.0–36.0)
MCV: 86.8 fL (ref 79.3–98.0)
MONO#: 0.3 10*3/uL (ref 0.1–0.9)
MONO%: 7.8 % (ref 0.0–14.0)
NEUT#: 2.9 10*3/uL (ref 1.5–6.5)
NEUT%: 65.7 % (ref 39.0–75.0)
Platelets: 129 10*3/uL — ABNORMAL LOW (ref 140–400)
RBC: 4.89 10*6/uL (ref 4.20–5.82)
RDW: 15.5 % — ABNORMAL HIGH (ref 11.0–14.6)
WBC: 4.4 10*3/uL (ref 4.0–10.3)

## 2016-03-29 ENCOUNTER — Telehealth: Payer: Self-pay | Admitting: Internal Medicine

## 2016-03-29 ENCOUNTER — Encounter: Payer: Self-pay | Admitting: Internal Medicine

## 2016-03-29 ENCOUNTER — Ambulatory Visit (HOSPITAL_BASED_OUTPATIENT_CLINIC_OR_DEPARTMENT_OTHER): Payer: BLUE CROSS/BLUE SHIELD | Admitting: Internal Medicine

## 2016-03-29 VITALS — BP 138/88 | HR 67 | Temp 98.9°F | Resp 18 | Ht 74.0 in | Wt 209.4 lb

## 2016-03-29 DIAGNOSIS — L270 Generalized skin eruption due to drugs and medicaments taken internally: Secondary | ICD-10-CM

## 2016-03-29 DIAGNOSIS — Z5111 Encounter for antineoplastic chemotherapy: Secondary | ICD-10-CM

## 2016-03-29 DIAGNOSIS — C7931 Secondary malignant neoplasm of brain: Secondary | ICD-10-CM | POA: Diagnosis not present

## 2016-03-29 DIAGNOSIS — C7951 Secondary malignant neoplasm of bone: Secondary | ICD-10-CM | POA: Diagnosis not present

## 2016-03-29 DIAGNOSIS — C787 Secondary malignant neoplasm of liver and intrahepatic bile duct: Secondary | ICD-10-CM

## 2016-03-29 DIAGNOSIS — C3411 Malignant neoplasm of upper lobe, right bronchus or lung: Secondary | ICD-10-CM | POA: Diagnosis not present

## 2016-03-29 DIAGNOSIS — L03039 Cellulitis of unspecified toe: Secondary | ICD-10-CM

## 2016-03-29 DIAGNOSIS — C3491 Malignant neoplasm of unspecified part of right bronchus or lung: Secondary | ICD-10-CM

## 2016-03-29 DIAGNOSIS — R197 Diarrhea, unspecified: Secondary | ICD-10-CM

## 2016-03-29 DIAGNOSIS — C7949 Secondary malignant neoplasm of other parts of nervous system: Secondary | ICD-10-CM

## 2016-03-29 NOTE — Telephone Encounter (Signed)
Gave patient avs report and appointments for August.  °

## 2016-03-29 NOTE — Progress Notes (Signed)
[facility] Telephone:(336) [phone]   [phone]  OFFICE PROGRESS NOTE  [name], MD [address] [place] [address]  DIAGNOSIS: stage IV (T2a, N0, M1b) non-small cell lung cancer consistent with poorly differentiated adenocarcinoma with positive EGFR mutation with deletion in exon 19, diagnosed in July of 2015 and presented with right upper lobe lung mass in addition to extensive liver, brain and bone metastases.  He had disease progression and development of EGFR T790M resistant mutation in April 2017. Primary site: Lung (Right)  Staging method: AJCC 7th Edition  Clinical: Stage IV (T2a, N0, M1b) signed by [name], MD on 03/26/2014 4:57 PM  Summary: Stage IV (T2a, N0, M1b)   PRIOR THERAPY:  1) Status post whole brain irradiation under the care Dr. Tammi Klippel completed 04/12/2014. 2) Gilotrif 40 mg po daily - therapy beginning 04/03/2014. Status post approximately 20 months of therapy discontinued secondary to disease progression and development of EGFR T790M resistant mutation.   CURRENT THERAPY:  1) Tagrisso 80 mg by mouth daily. First dose started 01/15/2016.   2) Xgeva 120 mcg subcutaneously on monthly basis.   DISEASE STAGE:  Lung cancer  Primary site: Lung (Right)  Staging method: AJCC 7th Edition  Clinical: Stage IV (T2a, N0, M1b) signed by [name], MD on 03/26/2014 4:57 PM  Summary: Stage IV (T2a, N0, M1b)  CHEMOTHERAPY INTENT: palliative  CURRENT # OF CHEMOTHERAPY CYCLES: 3 CURRENT ANTIEMETICS: none  CURRENT SMOKING STATUS: non-smoker/never smoker  ORAL CHEMOTHERAPY AND CONSENT: yes- Gilotrif, Tagrisso  CURRENT BISPHOSPHONATES USE: None  PAIN MANAGEMENT: none  NARCOTICS INDUCED CONSTIPATION: None  LIVING WILL AND CODE STATUS:   INTERVAL HISTORY: Oscar Floyd 50 y.o. male returns to the clinic today for followup visit. The patient is feeling fine today with no specific complaints. He mentions that he  feels the best ever since his diagnosis. He started treatment with Tagrisso on 01/15/2016 and tolerating his treatment well. He has no significant adverse effects. He denied having any rash or diarrhea. He denied having any significant chest pain, shortness breath, cough or hemoptysis. He denied having any weight loss or night sweats. He has no nausea or vomiting, no fever or chills. He had repeat CT scan of the chest, abdomen and pelvis performed recently and he is here for evaluation and discussion of his scan results.  MEDICAL HISTORY: Past Medical History  Diagnosis Date  . Hypertension   . Encounter for antineoplastic chemotherapy 01/06/2016  . Lung cancer ([place])     RUL lung with mets to liver, bone and brain  . Bone metastases ([place]) 08/26/2015    ALLERGIES:  has No Known Allergies.  MEDICATIONS:  Current Outpatient Prescriptions  Medication Sig Dispense Refill  . ALPRAZolam (XANAX) 1 MG tablet Reported on 02/11/2016    . calcium-vitamin D (OSCAL) 250-125 MG-UNIT per tablet Take 1 tablet by mouth daily. Reported on 01/12/2016    . diazepam (VALIUM) 10 MG tablet Take 1 tablet (10 mg total) by mouth as directed. 30 min before MRI 15 tablet 0  . esomeprazole (NEXIUM) 20 MG capsule Take by mouth 2 (two) times daily before a meal.     . loperamide (IMODIUM) 2 MG capsule Take 2 mg by mouth as needed for diarrhea or loose stools.    . magic mouthwash w/lidocaine SOLN Take 5 mLs by mouth 4 (four) times daily as needed for mouth pain (Duke's formula w/ lidocaine, nystatin 1:1.). 240 mL 1  .  mirtazapine (REMERON) 30 MG tablet Take 1 tablet (30 mg total) by mouth at bedtime. 30 tablet 2  . naproxen sodium (ANAPROX) 220 MG tablet Take 220 mg by mouth as needed.    Marland Kitchen osimertinib mesylate (TAGRISSO) 80 MG tablet Take 1 tablet (80 mg total) by mouth daily. 30 tablet 2   No current facility-administered medications for this visit.    SURGICAL HISTORY:  Past Surgical History  Procedure Laterality  Date  . Vasectomy    . Video bronchoscopy Bilateral 03/20/2014    Procedure: VIDEO BRONCHOSCOPY WITH FLUORO;  Surgeon: Oretha Milch, MD;  Location: [facility];  Service: Cardiopulmonary;  Laterality: Bilateral;    REVIEW OF SYSTEMS:  Constitutional: negative Eyes: negative Ears, nose, mouth, throat, and face: negative Respiratory: negative Cardiovascular: negative Gastrointestinal: negative Genitourinary:negative Integument/breast: negative Hematologic/lymphatic: negative Musculoskeletal:negative Neurological: negative Behavioral/Psych: negative Endocrine: negative Allergic/Immunologic: negative   PHYSICAL EXAMINATION: General appearance: alert, cooperative and no distress Head: Normocephalic, without obvious abnormality, atraumatic Neck: no adenopathy, no JVD, supple, symmetrical, trachea midline and thyroid not enlarged, symmetric, no tenderness/mass/nodules Lymph nodes: Cervical, supraclavicular, and axillary nodes normal. Resp: clear to auscultation bilaterally Back: symmetric, no curvature. ROM normal. No CVA tenderness. Cardio: regular rate and rhythm, S1, S2 normal, no murmur, click, rub or gallop GI: soft, non-tender; bowel sounds normal; no masses,  no organomegaly Extremities: extremities normal, atraumatic, no cyanosis or edema Neurologic: Alert and oriented X 3, normal strength and tone. Normal symmetric reflexes. Normal coordination and gait  ECOG PERFORMANCE STATUS: 0 - Asymptomatic  Blood pressure 138/88, pulse 67, temperature 98.9 F (37.2 C), temperature source Oral, resp. rate 18, height 6\' 2"  (1.88 m), weight 209 lb 6.4 oz (94.983 kg), SpO2 100 %.  LABORATORY DATA: Lab Results  Component Value Date   WBC 4.4 03/26/2016   HGB 14.0 03/26/2016   HCT 42.4 03/26/2016   MCV 86.8 03/26/2016   PLT 129* 03/26/2016      Chemistry      Component Value Date/Time   NA 141 03/26/2016 0810   K 4.4 03/26/2016 0810   CO2 24 03/26/2016 0810   BUN 10.8 03/26/2016  0810   CREATININE 1.2 03/26/2016 0810      Component Value Date/Time   CALCIUM 8.8 03/26/2016 0810   ALKPHOS 79 03/26/2016 0810   AST 30 03/26/2016 0810   ALT 23 03/26/2016 0810   BILITOT 0.58 03/26/2016 0810       RADIOGRAPHIC STUDIES: Ct Chest W Contrast  03/19/2016  CLINICAL DATA:  Restaging lung cancer diagnosed 2 years ago with metastatic disease to the liver, brain and bones. Chemotherapy ongoing. Radiation therapy completed. EXAM: CT CHEST, ABDOMEN, AND PELVIS WITH CONTRAST TECHNIQUE: Multidetector CT imaging of the chest, abdomen and pelvis was performed following the standard protocol during bolus administration of intravenous contrast. CONTRAST:  05/20/2016 ISOVUE-300 IOPAMIDOL (ISOVUE-300) INJECTION 61% COMPARISON:  CTs 01/05/2016 and 08/22/2015 FINDINGS: CT CHEST Cardiovascular: There are no significant vascular findings. The heart size is normal. There is no pericardial effusion. Mediastinum/Nodes: There are no enlarged mediastinal, hilar or axillary lymph nodes. There is a stable small distal paraesophageal node on image 48. There is a stable small hiatal hernia. Lungs/Pleura: There is no pleural effusion. The dominant right upper lobe lesion has not significantly changed measuring 18 x 11 mm on image 76. The multiple other ill-defined nodular densities in both lungs have improved compared with the prior study. Specifically, the 2.1 cm nodular density previously noted in the lingula has nearly completely resolved. Many  of the other smaller nodules are substantially improved as well. Patchy perihilar ground-glass opacities on the left are similar, probably treatment related. Musculoskeletal/Chest wall: Widespread blastic osseous metastatic disease is again noted throughout spine, ribs and sternum. A couple lesions appear larger, most notably at 12 mm lesion in the T5 vertebral body (image 23 of series 2). This may reflect treated disease. No enlarging lytic lesion or new pathologic fracture  identified. CT ABDOMEN AND PELVIS FINDINGS Hepatobiliary: Due to contrast timing, characterization of the liver is suboptimal on today's examination (the liver is imaged prior to opacification of the hepatic veins). However, the previously demonstrated small ill-defined low-density lesions are no longer clearly seen and are probably improved. No evidence of gallstones, gallbladder wall thickening or biliary dilatation. Pancreas: Unremarkable. No pancreatic ductal dilatation or surrounding inflammatory changes. Spleen: Normal in size without focal abnormality. Adrenals/Urinary Tract: Both adrenal glands appear normal. The kidneys appear normal without evidence of urinary tract calculus, suspicious lesion or hydronephrosis. No bladder abnormalities are seen. Stomach/Bowel: No evidence of bowel wall thickening, distention or surrounding inflammatory change. The appendix appears normal. There is moderate stool throughout the colon. Vascular/Lymphatic: There are no enlarged abdominal or pelvic lymph nodes. No significant vascular findings are present. Reproductive: Unremarkable. Other: No evidence of abdominal wall mass or hernia. Musculoskeletal: Widespread blastic metastatic disease is again noted, very similar in overall distribution to the prior study. There are a few enlarging lesions, most notably posteriorly in the right iliac bone (image 106) and in the right femoral neck (image 131). No pathologic fracture identified. IMPRESSION: 1. Interval improvement in widespread pulmonary metastatic disease. The dominant primary lesion in the right upper lobe has not significantly changed. 2. Widespread osseous metastatic disease is blastic and largely treated. A few of the lesions appear slightly larger than on the most recent study. No enlarging lytic lesion or pathologic fracture identified. 3. The previously demonstrated hepatic lesions are not well seen. Although hepatic assessment is limited by technique, improvement  in hepatic metastatic disease is likely. Electronically Signed   By: Richardean Sale M.D.   On: 03/19/2016 11:02   Ct Abdomen Pelvis W Contrast  03/19/2016  CLINICAL DATA:  Restaging lung cancer diagnosed 2 years ago with metastatic disease to the liver, brain and bones. Chemotherapy ongoing. Radiation therapy completed. EXAM: CT CHEST, ABDOMEN, AND PELVIS WITH CONTRAST TECHNIQUE: Multidetector CT imaging of the chest, abdomen and pelvis was performed following the standard protocol during bolus administration of intravenous contrast. CONTRAST:  127m ISOVUE-300 IOPAMIDOL (ISOVUE-300) INJECTION 61% COMPARISON:  CTs 01/05/2016 and 08/22/2015 FINDINGS: CT CHEST Cardiovascular: There are no significant vascular findings. The heart size is normal. There is no pericardial effusion. Mediastinum/Nodes: There are no enlarged mediastinal, hilar or axillary lymph nodes. There is a stable small distal paraesophageal node on image 48. There is a stable small hiatal hernia. Lungs/Pleura: There is no pleural effusion. The dominant right upper lobe lesion has not significantly changed measuring 18 x 11 mm on image 76. The multiple other ill-defined nodular densities in both lungs have improved compared with the prior study. Specifically, the 2.1 cm nodular density previously noted in the lingula has nearly completely resolved. Many of the other smaller nodules are substantially improved as well. Patchy perihilar ground-glass opacities on the left are similar, probably treatment related. Musculoskeletal/Chest wall: Widespread blastic osseous metastatic disease is again noted throughout spine, ribs and sternum. A couple lesions appear larger, most notably at 12 mm lesion in the T5 vertebral body (image 23 of  series 2). This may reflect treated disease. No enlarging lytic lesion or new pathologic fracture identified. CT ABDOMEN AND PELVIS FINDINGS Hepatobiliary: Due to contrast timing, characterization of the liver is suboptimal on  today's examination (the liver is imaged prior to opacification of the hepatic veins). However, the previously demonstrated small ill-defined low-density lesions are no longer clearly seen and are probably improved. No evidence of gallstones, gallbladder wall thickening or biliary dilatation. Pancreas: Unremarkable. No pancreatic ductal dilatation or surrounding inflammatory changes. Spleen: Normal in size without focal abnormality. Adrenals/Urinary Tract: Both adrenal glands appear normal. The kidneys appear normal without evidence of urinary tract calculus, suspicious lesion or hydronephrosis. No bladder abnormalities are seen. Stomach/Bowel: No evidence of bowel wall thickening, distention or surrounding inflammatory change. The appendix appears normal. There is moderate stool throughout the colon. Vascular/Lymphatic: There are no enlarged abdominal or pelvic lymph nodes. No significant vascular findings are present. Reproductive: Unremarkable. Other: No evidence of abdominal wall mass or hernia. Musculoskeletal: Widespread blastic metastatic disease is again noted, very similar in overall distribution to the prior study. There are a few enlarging lesions, most notably posteriorly in the right iliac bone (image 106) and in the right femoral neck (image 131). No pathologic fracture identified. IMPRESSION: 1. Interval improvement in widespread pulmonary metastatic disease. The dominant primary lesion in the right upper lobe has not significantly changed. 2. Widespread osseous metastatic disease is blastic and largely treated. A few of the lesions appear slightly larger than on the most recent study. No enlarging lytic lesion or pathologic fracture identified. 3. The previously demonstrated hepatic lesions are not well seen. Although hepatic assessment is limited by technique, improvement in hepatic metastatic disease is likely. Electronically Signed   By: Richardean Sale M.D.   On: 03/19/2016 11:02   ASSESSMENT  AND PLAN: This is a very pleasant 50 years old white male with [name], adenocarcinoma with positive EGFR mutation in exon 19 who was treated with oral Gilotrif 40 mg by mouth daily status post 20 months and tolerating his treatment fairly well except for the grade 1 skin rash and mild paronychia of the toes as well as mild diarrhea once daily. He takes Imodium at regular basis usually in the morning. Unfortunately the recent CT scan of the chest, abdomen and pelvis showed evidence for disease progression with innumerable new and enlarging pulmonary nodules throughout the lungs bilaterally as well as new liver lesions. The plasma test for the EGFR mutation showed EGFR resistant mutation, T790M. He is currently undergoing treatment with Tagrisso 80 mg by mouth daily since 01/15/2016 and tolerating his treatment well. The recent CT scan of the chest, abdomen and pelvis showed significant improvement of his disease. I discussed the scan results with the patient today. I recommended for the patient to continue his treatment with Tagrisso with the same dose. I will see him back for follow-up visit in one month for reevaluation with repeat CBC and complete answered metabolic panel.  For the metastatic bone disease, he will continue his treatment with Xgeva. He was advised to call immediately if he has any concerning symptoms in the interval. The patient voices understanding of current disease status and treatment options and is in agreement with the current care plan.  All questions were answered. The patient knows to call the clinic with any problems, questions or concerns. We can certainly see the patient much sooner if necessary.  Disclaimer: This note was dictated with voice recognition software. Similar sounding words  can inadvertently be transcribed and may not be corrected upon review.

## 2016-04-14 ENCOUNTER — Other Ambulatory Visit: Payer: Self-pay | Admitting: Medical Oncology

## 2016-04-14 DIAGNOSIS — C7949 Secondary malignant neoplasm of other parts of nervous system: Secondary | ICD-10-CM

## 2016-04-14 DIAGNOSIS — C3491 Malignant neoplasm of unspecified part of right bronchus or lung: Secondary | ICD-10-CM

## 2016-04-14 DIAGNOSIS — C7931 Secondary malignant neoplasm of brain: Secondary | ICD-10-CM

## 2016-04-14 MED ORDER — OSIMERTINIB MESYLATE 80 MG PO TABS: 80.0000 mg | ORAL_TABLET | Freq: Every day | ORAL | 2 refills | Status: DC

## 2016-04-28 ENCOUNTER — Other Ambulatory Visit (HOSPITAL_BASED_OUTPATIENT_CLINIC_OR_DEPARTMENT_OTHER): Payer: BLUE CROSS/BLUE SHIELD

## 2016-04-28 ENCOUNTER — Encounter: Payer: Self-pay | Admitting: Internal Medicine

## 2016-04-28 ENCOUNTER — Ambulatory Visit (HOSPITAL_BASED_OUTPATIENT_CLINIC_OR_DEPARTMENT_OTHER): Payer: BLUE CROSS/BLUE SHIELD | Admitting: Internal Medicine

## 2016-04-28 VITALS — BP 131/82 | HR 70 | Temp 98.7°F | Resp 18 | Ht 74.0 in | Wt 207.6 lb

## 2016-04-28 DIAGNOSIS — L270 Generalized skin eruption due to drugs and medicaments taken internally: Secondary | ICD-10-CM

## 2016-04-28 DIAGNOSIS — C3411 Malignant neoplasm of upper lobe, right bronchus or lung: Secondary | ICD-10-CM

## 2016-04-28 DIAGNOSIS — C7931 Secondary malignant neoplasm of brain: Secondary | ICD-10-CM

## 2016-04-28 DIAGNOSIS — C7951 Secondary malignant neoplasm of bone: Secondary | ICD-10-CM | POA: Diagnosis not present

## 2016-04-28 DIAGNOSIS — C787 Secondary malignant neoplasm of liver and intrahepatic bile duct: Secondary | ICD-10-CM

## 2016-04-28 DIAGNOSIS — C7949 Secondary malignant neoplasm of other parts of nervous system: Secondary | ICD-10-CM

## 2016-04-28 DIAGNOSIS — C3491 Malignant neoplasm of unspecified part of right bronchus or lung: Secondary | ICD-10-CM

## 2016-04-28 DIAGNOSIS — Z5111 Encounter for antineoplastic chemotherapy: Secondary | ICD-10-CM

## 2016-04-28 DIAGNOSIS — L03039 Cellulitis of unspecified toe: Secondary | ICD-10-CM

## 2016-04-28 DIAGNOSIS — R197 Diarrhea, unspecified: Secondary | ICD-10-CM

## 2016-04-28 LAB — CBC WITH DIFFERENTIAL/PLATELET
BASO%: 0.2 % (ref 0.0–2.0)
BASOS ABS: 0 10*3/uL (ref 0.0–0.1)
EOS%: 3.4 % (ref 0.0–7.0)
Eosinophils Absolute: 0.2 10*3/uL (ref 0.0–0.5)
HEMATOCRIT: 41.1 % (ref 38.4–49.9)
HGB: 14 g/dL (ref 13.0–17.1)
LYMPH#: 1.2 10*3/uL (ref 0.9–3.3)
LYMPH%: 27.3 % (ref 14.0–49.0)
MCH: 29.4 pg (ref 27.2–33.4)
MCHC: 34.1 g/dL (ref 32.0–36.0)
MCV: 86.3 fL (ref 79.3–98.0)
MONO#: 0.3 10*3/uL (ref 0.1–0.9)
MONO%: 7.4 % (ref 0.0–14.0)
NEUT#: 2.7 10*3/uL (ref 1.5–6.5)
NEUT%: 61.7 % (ref 39.0–75.0)
Platelets: 134 10*3/uL — ABNORMAL LOW (ref 140–400)
RBC: 4.76 10*6/uL (ref 4.20–5.82)
RDW: 14.1 % (ref 11.0–14.6)
WBC: 4.4 10*3/uL (ref 4.0–10.3)

## 2016-04-28 LAB — COMPREHENSIVE METABOLIC PANEL
ALBUMIN: 3.7 g/dL (ref 3.5–5.0)
ALT: 22 U/L (ref 0–55)
ANION GAP: 7 meq/L (ref 3–11)
AST: 27 U/L (ref 5–34)
Alkaline Phosphatase: 72 U/L (ref 40–150)
BUN: 9.8 mg/dL (ref 7.0–26.0)
CALCIUM: 9.4 mg/dL (ref 8.4–10.4)
CHLORIDE: 106 meq/L (ref 98–109)
CO2: 27 meq/L (ref 22–29)
Creatinine: 1.2 mg/dL (ref 0.7–1.3)
EGFR: 72 mL/min/{1.73_m2} — AB (ref >90)
GLUCOSE: 109 mg/dL (ref 70–140)
POTASSIUM: 4.4 meq/L (ref 3.5–5.1)
Sodium: 139 mEq/L (ref 136–145)
Total Bilirubin: 0.63 mg/dL (ref 0.20–1.20)
Total Protein: 6.8 g/dL (ref 6.4–8.3)

## 2016-04-28 NOTE — Progress Notes (Signed)
[facility] [phone]   [phone]) [phone]  OFFICE PROGRESS NOTE  [name], MD [address] [place] [address]  DIAGNOSIS: stage IV (T2a, N0, M1b) non-small cell lung cancer consistent with poorly differentiated adenocarcinoma with positive EGFR mutation with deletion in exon 19, diagnosed in July of 2015 and presented with right upper lobe lung mass in addition to extensive liver, brain and bone metastases.  He had disease progression and development of EGFR T790M resistant mutation in April 2017. Primary site: Lung (Right)  Staging method: AJCC 7th Edition  Clinical: Stage IV (T2a, N0, M1b) signed by [name], MD on 03/26/2014 4:57 PM  Summary: Stage IV (T2a, N0, M1b)   PRIOR THERAPY:  1) Status post whole brain irradiation under the care Dr. Tammi Klippel completed 04/12/2014. 2) Gilotrif 40 mg po daily - therapy beginning 04/03/2014. Status post approximately 20 months of therapy discontinued secondary to disease progression and development of EGFR T790M resistant mutation.   CURRENT THERAPY:  1) Tagrisso 80 mg by mouth daily. First dose started 01/15/2016. Status post 3 months of treatment. 2) Xgeva 120 mcg subcutaneously on monthly basis.   DISEASE STAGE:  Lung cancer  Primary site: Lung (Right)  Staging method: AJCC 7th Edition  Clinical: Stage IV (T2a, N0, M1b) signed by [name], MD on 03/26/2014 4:57 PM  Summary: Stage IV (T2a, N0, M1b)  CHEMOTHERAPY INTENT: palliative  CURRENT # OF CHEMOTHERAPY CYCLES: 4 CURRENT ANTIEMETICS: none  CURRENT SMOKING STATUS: non-smoker/never smoker  ORAL CHEMOTHERAPY AND CONSENT: yes- Gilotrif, Tagrisso  CURRENT BISPHOSPHONATES USE: None  PAIN MANAGEMENT: none  NARCOTICS INDUCED CONSTIPATION: None  LIVING WILL AND CODE STATUS:   INTERVAL HISTORY: Oscar Floyd 50 y.o. male returns to the clinic today for followup visit. The patient is feeling fine  today with no specific complaints. He is currently on Tagrisso 80 mg by mouth daily and tolerating it fairly well. He has no significant adverse effects. He denied having any rash or diarrhea. He denied having any significant chest pain, shortness breath, cough or hemoptysis. He denied having any weight loss or night sweats. He has no nausea or vomiting, no fever or chills. He has been off treatment with Xgeva for more than 4 months and the patient is scheduled to have some dental fillings today. His dentist is familiar of his previous treatment with Xgeva.  MEDICAL HISTORY: Past Medical History:  Diagnosis Date  . Bone metastases ([place]) 08/26/2015  . Encounter for antineoplastic chemotherapy 01/06/2016  . Hypertension   . Lung cancer ([place])    RUL lung with mets to liver, bone and brain    ALLERGIES:  has No Known Allergies.  MEDICATIONS:  Current Outpatient Prescriptions  Medication Sig Dispense Refill  . ALPRAZolam (XANAX) 1 MG tablet Reported on 02/11/2016    . calcium-vitamin D (OSCAL) 250-125 MG-UNIT per tablet Take 1 tablet by mouth daily. Reported on 01/12/2016    . diazepam (VALIUM) 10 MG tablet Take 1 tablet (10 mg total) by mouth as directed. 30 min before MRI 15 tablet 0  . esomeprazole (NEXIUM) 20 MG capsule Take by mouth 2 (two) times daily before a meal.     . loperamide (IMODIUM) 2 MG capsule Take 2 mg by mouth as needed for diarrhea or loose stools.    . magic mouthwash w/lidocaine SOLN Take 5 mLs by mouth 4 (four) times daily as needed for mouth pain ([employer]'s formula w/ lidocaine,  nystatin 1:1.). 240 mL 1  . mirtazapine (REMERON) 30 MG tablet Take 1 tablet (30 mg total) by mouth at bedtime. 30 tablet 2  . naproxen sodium (ANAPROX) 220 MG tablet Take 220 mg by mouth as needed.    Marland Kitchen osimertinib mesylate (TAGRISSO) 80 MG tablet Take 1 tablet (80 mg total) by mouth daily. 30 tablet 2   No current facility-administered medications for this visit.     SURGICAL HISTORY:  Past  Surgical History:  Procedure Laterality Date  . VASECTOMY    . VIDEO BRONCHOSCOPY Bilateral 03/20/2014   Procedure: VIDEO BRONCHOSCOPY WITH FLUORO;  Surgeon: Rigoberto Noel, MD;  Location: [facility];  Service: Cardiopulmonary;  Laterality: Bilateral;    REVIEW OF SYSTEMS:  A comprehensive review of systems was negative.   PHYSICAL EXAMINATION: General appearance: alert, cooperative and no distress Head: Normocephalic, without obvious abnormality, atraumatic Neck: no adenopathy, no JVD, supple, symmetrical, trachea midline and thyroid not enlarged, symmetric, no tenderness/mass/nodules Lymph nodes: Cervical, supraclavicular, and axillary nodes normal. Resp: clear to auscultation bilaterally Back: symmetric, no curvature. ROM normal. No CVA tenderness. Cardio: regular rate and rhythm, S1, S2 normal, no murmur, click, rub or gallop GI: soft, non-tender; bowel sounds normal; no masses,  no organomegaly Extremities: extremities normal, atraumatic, no cyanosis or edema Neurologic: Alert and oriented X 3, normal strength and tone. Normal symmetric reflexes. Normal coordination and gait  ECOG PERFORMANCE STATUS: 0 - Asymptomatic  Blood pressure 131/82, pulse 70, temperature 98.7 F (37.1 C), temperature source Oral, resp. rate 18, height 6' 2"  (1.88 m), weight 207 lb 9.6 oz (94.2 kg), SpO2 100 %.  LABORATORY DATA: Lab Results  Component Value Date   WBC 4.4 04/28/2016   HGB 14.0 04/28/2016   HCT 41.1 04/28/2016   MCV 86.3 04/28/2016   PLT 134 (L) 04/28/2016      Chemistry      Component Value Date/Time   NA 141 03/26/2016 0810   K 4.4 03/26/2016 0810   CO2 24 03/26/2016 0810   BUN 10.8 03/26/2016 0810   CREATININE 1.2 03/26/2016 0810      Component Value Date/Time   CALCIUM 8.8 03/26/2016 0810   ALKPHOS 79 03/26/2016 0810   AST 30 03/26/2016 0810   ALT 23 03/26/2016 0810   BILITOT 0.58 03/26/2016 0810       RADIOGRAPHIC STUDIES: No results found. ASSESSMENT AND PLAN:  This is a very pleasant 50 years old white male with [name], adenocarcinoma with positive EGFR mutation in exon 19 who was treated with oral Gilotrif 40 mg by mouth daily status post 20 months and tolerating his treatment fairly well except for the grade 1 skin rash and mild paronychia of the toes as well as mild diarrhea once daily. He takes Imodium at regular basis usually in the morning. Unfortunately the recent CT scan of the chest, abdomen and pelvis showed evidence for disease progression with innumerable new and enlarging pulmonary nodules throughout the lungs bilaterally as well as new liver lesions. The plasma test for the EGFR mutation showed EGFR resistant mutation, T790M. He is currently undergoing treatment with Tagrisso 80 mg by mouth daily since 01/15/2016 and tolerating his treatment well. I recommended for the patient to continue his treatment with Tagrisso with the same dose. I will see him back for follow-up visit in one month for reevaluation with repeat CBC and complete answered metabolic panel.  For the metastatic bone disease, he will continue his treatment with Delton See after  clearance by his dentist from the dental workup. He was advised to call immediately if he has any concerning symptoms in the interval. The patient voices understanding of current disease status and treatment options and is in agreement with the current care plan.  All questions were answered. The patient knows to call the clinic with any problems, questions or concerns. We can certainly see the patient much sooner if necessary.  Disclaimer: This note was dictated with voice recognition software. Similar sounding words can inadvertently be transcribed and may not be corrected upon review.

## 2016-05-03 ENCOUNTER — Telehealth: Payer: Self-pay | Admitting: Internal Medicine

## 2016-05-03 NOTE — Telephone Encounter (Signed)
Appointment date conf with patient. 05/03/16

## 2016-05-04 ENCOUNTER — Telehealth: Payer: Self-pay

## 2016-05-04 ENCOUNTER — Other Ambulatory Visit: Payer: Self-pay | Admitting: Radiation Therapy

## 2016-05-04 DIAGNOSIS — C7931 Secondary malignant neoplasm of brain: Secondary | ICD-10-CM

## 2016-05-04 DIAGNOSIS — C7949 Secondary malignant neoplasm of other parts of nervous system: Principal | ICD-10-CM

## 2016-05-04 NOTE — Telephone Encounter (Signed)
Mr. Oscar Floyd called today concerning an MRI that he believed needed to be scheduled soon. After reviewing his previous follow up note, I verified that he does have recommendations documented to have an MRI at this time. I have discussed this with Mont Dutton (Radiation special Procedures navigator) and she verbalizes to me that she will schedule this MRI and will contact the patient to tell him the time and place.

## 2016-05-20 ENCOUNTER — Ambulatory Visit
Admission: RE | Admit: 2016-05-20 | Discharge: 2016-05-20 | Disposition: A | Discharge status: Home / Self Care | Payer: BLUE CROSS/BLUE SHIELD | Source: Ambulatory Visit | Attending: Radiation Oncology | Admitting: Radiation Oncology

## 2016-05-20 DIAGNOSIS — C7931 Secondary malignant neoplasm of brain: Secondary | ICD-10-CM

## 2016-05-20 DIAGNOSIS — C7949 Secondary malignant neoplasm of other parts of nervous system: Principal | ICD-10-CM

## 2016-05-20 MED ORDER — GADOBENATE DIMEGLUMINE 529 MG/ML IV SOLN
19.0000 mL | Freq: Once | INTRAVENOUS | Status: AC | PRN
  Administered 2016-05-20: 19 mL via INTRAVENOUS

## 2016-05-24 ENCOUNTER — Ambulatory Visit
Admission: RE | Admit: 2016-05-24 | Discharge: 2016-05-24 | Disposition: A | Discharge status: Home / Self Care | Payer: BLUE CROSS/BLUE SHIELD | Source: Ambulatory Visit | Attending: Radiation Oncology | Admitting: Radiation Oncology

## 2016-05-24 ENCOUNTER — Encounter: Payer: Self-pay | Admitting: Genetic Counselor

## 2016-05-24 VITALS — BP 131/86 | HR 73 | Resp 16 | Wt 210.9 lb

## 2016-05-24 DIAGNOSIS — C3491 Malignant neoplasm of unspecified part of right bronchus or lung: Secondary | ICD-10-CM

## 2016-05-24 DIAGNOSIS — C3411 Malignant neoplasm of upper lobe, right bronchus or lung: Secondary | ICD-10-CM | POA: Insufficient documentation

## 2016-05-24 DIAGNOSIS — C7951 Secondary malignant neoplasm of bone: Secondary | ICD-10-CM | POA: Insufficient documentation

## 2016-05-24 DIAGNOSIS — C787 Secondary malignant neoplasm of liver and intrahepatic bile duct: Secondary | ICD-10-CM | POA: Insufficient documentation

## 2016-05-24 DIAGNOSIS — I10 Essential (primary) hypertension: Secondary | ICD-10-CM | POA: Diagnosis not present

## 2016-05-24 DIAGNOSIS — R0981 Nasal congestion: Secondary | ICD-10-CM | POA: Insufficient documentation

## 2016-05-24 DIAGNOSIS — C7931 Secondary malignant neoplasm of brain: Secondary | ICD-10-CM | POA: Diagnosis not present

## 2016-05-24 NOTE — Progress Notes (Signed)
Weight and vitals stable. Denies pain. States, "I feel great. This new chemo has very little side effects." Confirms he continues to work full time and play golf. Denies nausea, vomiting, diplopia or ringing in the ears. Reports short term memory is unchanged. Reports dull headaches and dizziness when sitting up after laying down. Steady gait noted. Patient without complaints or needs at this time. Patient denies taking decadron.   BP 131/86 (BP Location: Right Arm, Patient Position: Sitting, Cuff Size: Normal)   Pulse 73   Resp 16   Wt 210 lb 14.4 oz (95.7 kg)   SpO2 100%   BMI 27.08 kg/m  Wt Readings from Last 3 Encounters:  05/24/16 210 lb 14.4 oz (95.7 kg)  05/20/16 207 lb (93.9 kg)  04/28/16 207 lb 9.6 oz (94.2 kg)

## 2016-05-24 NOTE — Progress Notes (Signed)
Radiation Oncology         [id number]   Name: Oscar Floyd   Date: 05/24/2016   MRN: [id number]  DOB: [birth year]    Multidisciplinary [facility] Clinic Follow-Up Visit Note  CC: Oscar Inches, MD  Oscar Bears, MD    ICD-9-CM ICD-10-CM   1. Non-small cell cancer of right lung ([place]) 162.9 C34.91 Ambulatory Referral to Genetics  2. Sinus congestion 478.19 R09.81     Diagnosis:  50 y.o. 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 CT scan of the chest/abd/pelvis on 03/19/16 showed improvement in widespread pulmonary metastatic disease, the dominant primary lesion in the RUL has not significantly changed, and widespread osseous metastatic disease is blastic and largely treated with a few lesions appearing slightly larger. MRI of the brain w/ w/o contrast on 05/20/16 showed no evidence of residual, recurrent, or new brain metastases. The patient saw Dr. Julien Floyd on 04/28/16. He is currently undergoing treatment with Tagrisso 80 mg by mouth daily.  On Review of Systems: Weight and vitals stable. Denies pain. States, "I feel great. This new chemo has very little side effects." Confirms he continues to work full time and play golf. Denies nausea, vomiting, diplopia, or ringing in the ears. Reports short term memory is unchanged. Reports dull headaches and dizziness when sitting up after laying down. He reports clogged sinuses. Steady gait noted by the nurse. The patient is without complaints or needs at this time. The patient denies taking decadron. A complete review of systems is obtained and is otherwise negative.  Past Medical History:  Past Medical History:  Diagnosis Date  . Bone metastases ([place]) 08/26/2015  . Encounter for  antineoplastic chemotherapy 01/06/2016  . Hypertension   . Lung cancer ([place])    RUL lung with mets to liver, bone and brain    Past Surgical History: Past Surgical History:  Procedure Laterality Date  . VASECTOMY    . VIDEO BRONCHOSCOPY Bilateral 03/20/2014   Procedure: VIDEO BRONCHOSCOPY WITH FLUORO;  Surgeon: Rigoberto Noel, MD;  Location: [facility];  Service: Cardiopulmonary;  Laterality: Bilateral;    Social History:  Social History   Social History  . Marital status: Married    Spouse name: N/A  . Number of children: N/A  . Years of education: N/A   Occupational History  . Not on file.   Social History Main Topics  . Smoking status: Never Smoker  . Smokeless tobacco: Never Used  . Alcohol use Yes     Comment: socially  . Drug use: No  . Sexual activity: Yes   Other Topics Concern  . Not on file   Social History Narrative  . No narrative on file    Family History: Family History  Problem Relation Age of Onset  . Cancer Father     liposarcoma age 51  . Cancer Mother     gist  . Cancer Paternal Grandfather     lung cancer; heavy smoker; also had abdominal cancer in 56's    ALLERGIES:  has No Known Allergies.  Meds: Current Outpatient Prescriptions  Medication Sig Dispense Refill  . ALPRAZolam (XANAX) 1 MG tablet Reported on 02/11/2016    . calcium-vitamin D (OSCAL) 250-125 MG-UNIT per tablet Take 1 tablet  by mouth daily. Reported on 01/12/2016    . esomeprazole (NEXIUM) 20 MG capsule Take by mouth 2 (two) times daily before a meal.     . naproxen sodium (ANAPROX) 220 MG tablet Take 220 mg by mouth as needed.    Marland Kitchen osimertinib mesylate (TAGRISSO) 80 MG tablet Take 1 tablet (80 mg total) by mouth daily. 30 tablet 2  . diazepam (VALIUM) 10 MG tablet Take 1 tablet (10 mg total) by mouth as directed. 30 min before MRI (Patient not taking: Reported on 05/24/2016) 15 tablet 0  . loperamide (IMODIUM) 2 MG capsule Take 2 mg by mouth as needed for diarrhea or loose  stools.    . magic mouthwash w/lidocaine SOLN Take 5 mLs by mouth 4 (four) times daily as needed for mouth pain ([employer] formula w/ lidocaine, nystatin 1:1.). (Patient not taking: Reported on 05/24/2016) 240 mL 1  . mirtazapine (REMERON) 30 MG tablet Take 1 tablet (30 mg total) by mouth at bedtime. (Patient not taking: Reported on 05/24/2016) 30 tablet 2   No current facility-administered medications for this encounter.     Physical Findings:  weight is 210 lb 14.4 oz (95.7 kg). His blood pressure is 131/86 and his pulse is 73. His respiration is 16 and oxygen saturation is 100%.   In general this is a well appearing Caucasian male in no acute distress. He's alert and oriented x4 and appropriate throughout the examination. Cardiopulmonary assessment is negative for acute distress and he exhibits normal effort.   Lab Findings: Lab Results  Component Value Date   WBC 4.4 04/28/2016   HGB 14.0 04/28/2016   HCT 41.1 04/28/2016   MCV 86.3 04/28/2016   PLT 134 (L) 04/28/2016    Radiographic Findings: Mr [name] ZM Contrast  Result Date: 05/20/2016 CLINICAL DATA:  S RS restaging. Previous metastatic lung cancer treated with whole-brain radiotherapy. EXAM: MRI HEAD WITHOUT AND WITH CONTRAST TECHNIQUE: Multiplanar, multiecho pulse sequences of the brain and surrounding structures were obtained without and with intravenous contrast. CONTRAST:  50m MULTIHANCE GADOBENATE DIMEGLUMINE 529 MG/ML IV SOLN COMPARISON:  01/30/2016 and previous FINDINGS: Clival, right occipital condyles and upper cervical metastatic disease remains evident, similar to the previous exam. No new lesions. Diffusion imaging does not show any acute or subacute infarction. There are punctate T2 foci within the cerebellum an the cerebral hemispheres related to previously treated metastases. After contrast administration, there is no evidence of residual or recurrent viable tumor. No hemorrhage, hydrocephalus or extra-axial collection. No  pituitary mass. There are mucosal inflammatory changes of the left maxillary sinus. IMPRESSION: No evidence of residual, recurrent or new brain metastases. Tiny T2 foci within the brain relate to residual change of previously treated metastases. Osseous metastatic disease of the skullbase and cervical spine as seen previously, nonprogressive. Electronically Signed   By: MNelson ChimesM.D.   On: 05/20/2016 13:23   Impression/Plan: 1.  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 The patient has no evidence of progressive or recurrent brain metastases at this time on recent MRI. Continue serial imaging with MRI in 3 months and a follow up afterwards. He will continue to follow up with Dr. MJulien Nordmannregarding systemic treatment. 2.  Sinus congestion.The patient has symptoms of congestion in the left maxillary region. We discussed the use of a Neti Pot to mobilize the mucus. 3. Possible genetic predisposition to cancer. At conference this morning, Genetics discussed they were interested in testing for this patient.  The patient is interested and we will make a referral. _____________________________________ ------------------------------------------------  Carola Rhine, PAC  This document serves as a record of services personally performed by Shona Simpson, PA-C. It was created on her behalf by Darcus Austin, a trained medical scribe. The creation of this record is based on the scribe's personal observations and the provider's statements to them. This document has been checked and approved by the attending provider.

## 2016-05-25 NOTE — Addendum Note (Signed)
Encounter addended by: Heywood Footman, RN on: 05/25/2016  8:35 AM<BR>    Actions taken: Charge Capture section accepted

## 2016-05-27 ENCOUNTER — Telehealth: Payer: Self-pay | Admitting: Internal Medicine

## 2016-05-27 ENCOUNTER — Other Ambulatory Visit (HOSPITAL_BASED_OUTPATIENT_CLINIC_OR_DEPARTMENT_OTHER): Payer: BLUE CROSS/BLUE SHIELD

## 2016-05-27 ENCOUNTER — Ambulatory Visit (HOSPITAL_BASED_OUTPATIENT_CLINIC_OR_DEPARTMENT_OTHER): Payer: BLUE CROSS/BLUE SHIELD | Admitting: Internal Medicine

## 2016-05-27 ENCOUNTER — Encounter: Payer: Self-pay | Admitting: Internal Medicine

## 2016-05-27 VITALS — BP 137/92 | HR 83 | Temp 98.8°F | Resp 17 | Ht 74.0 in | Wt 206.6 lb

## 2016-05-27 DIAGNOSIS — Z5111 Encounter for antineoplastic chemotherapy: Secondary | ICD-10-CM

## 2016-05-27 DIAGNOSIS — C3491 Malignant neoplasm of unspecified part of right bronchus or lung: Secondary | ICD-10-CM

## 2016-05-27 DIAGNOSIS — C7949 Secondary malignant neoplasm of other parts of nervous system: Secondary | ICD-10-CM

## 2016-05-27 DIAGNOSIS — C7951 Secondary malignant neoplasm of bone: Secondary | ICD-10-CM | POA: Diagnosis not present

## 2016-05-27 DIAGNOSIS — C787 Secondary malignant neoplasm of liver and intrahepatic bile duct: Secondary | ICD-10-CM | POA: Diagnosis not present

## 2016-05-27 DIAGNOSIS — C7931 Secondary malignant neoplasm of brain: Secondary | ICD-10-CM | POA: Diagnosis not present

## 2016-05-27 DIAGNOSIS — C3411 Malignant neoplasm of upper lobe, right bronchus or lung: Secondary | ICD-10-CM | POA: Diagnosis not present

## 2016-05-27 LAB — COMPREHENSIVE METABOLIC PANEL
ALBUMIN: 3.6 g/dL (ref 3.5–5.0)
ALT: 28 U/L (ref 0–55)
ANION GAP: 8 meq/L (ref 3–11)
AST: 30 U/L (ref 5–34)
Alkaline Phosphatase: 69 U/L (ref 40–150)
BUN: 13.6 mg/dL (ref 7.0–26.0)
CALCIUM: 9.3 mg/dL (ref 8.4–10.4)
CHLORIDE: 108 meq/L (ref 98–109)
CO2: 23 meq/L (ref 22–29)
CREATININE: 1.2 mg/dL (ref 0.7–1.3)
EGFR: 69 mL/min/{1.73_m2} — AB (ref >90)
GLUCOSE: 148 mg/dL — AB (ref 70–140)
Potassium: 4.1 mEq/L (ref 3.5–5.1)
Sodium: 139 mEq/L (ref 136–145)
TOTAL PROTEIN: 6.8 g/dL (ref 6.4–8.3)
Total Bilirubin: 0.65 mg/dL (ref 0.20–1.20)

## 2016-05-27 LAB — CBC WITH DIFFERENTIAL/PLATELET
BASO%: 0.5 % (ref 0.0–2.0)
Basophils Absolute: 0 10*3/uL (ref 0.0–0.1)
EOS ABS: 0.2 10*3/uL (ref 0.0–0.5)
EOS%: 3.6 % (ref 0.0–7.0)
HEMATOCRIT: 42.6 % (ref 38.4–49.9)
HEMOGLOBIN: 14.3 g/dL (ref 13.0–17.1)
LYMPH#: 1.3 10*3/uL (ref 0.9–3.3)
LYMPH%: 27 % (ref 14.0–49.0)
MCH: 29.4 pg (ref 27.2–33.4)
MCHC: 33.5 g/dL (ref 32.0–36.0)
MCV: 87.8 fL (ref 79.3–98.0)
MONO#: 0.3 10*3/uL (ref 0.1–0.9)
MONO%: 7 % (ref 0.0–14.0)
NEUT%: 61.9 % (ref 39.0–75.0)
NEUTROS ABS: 2.9 10*3/uL (ref 1.5–6.5)
PLATELETS: 140 10*3/uL (ref 140–400)
RBC: 4.85 10*6/uL (ref 4.20–5.82)
RDW: 14.3 % (ref 11.0–14.6)
WBC: 4.7 10*3/uL (ref 4.0–10.3)

## 2016-05-27 NOTE — Telephone Encounter (Signed)
Patient stated that he will have water based contrast on day of CT appt. Avs report and schedule given to patient, per 05/27/16 los.

## 2016-05-27 NOTE — Progress Notes (Signed)
[place] [phone]   [phone]  OFFICE PROGRESS NOTE  [name], MD [address] [place] [address]  DIAGNOSIS: stage IV (T2a, N0, M1b) non-small cell lung cancer consistent with poorly differentiated adenocarcinoma with positive EGFR mutation with deletion in exon 19, diagnosed in July of 2015 and presented with right upper lobe lung mass in addition to extensive liver, brain and bone metastases.  He had disease progression and development of EGFR T790M resistant mutation in April 2017. Primary site: Lung (Right)  Staging method: AJCC 7th Edition  Clinical: Stage IV (T2a, N0, M1b) signed by [name], MD on 03/26/2014 4:57 PM  Summary: Stage IV (T2a, N0, M1b)   PRIOR THERAPY:  1) Status post whole brain irradiation under the care Dr. Tammi Klippel completed 04/12/2014. 2) Gilotrif 40 mg po daily - therapy beginning 04/03/2014. Status post approximately 20 months of therapy discontinued secondary to disease progression and development of EGFR T790M resistant mutation.   CURRENT THERAPY:  1) Tagrisso 80 mg by mouth daily. First dose started 01/15/2016. Status post 4 months of treatment. 2) Xgeva 120 mcg subcutaneously on monthly basis.   DISEASE STAGE:  Lung cancer  Primary site: Lung (Right)  Staging method: AJCC 7th Edition  Clinical: Stage IV (T2a, N0, M1b) signed by [name], MD on 03/26/2014 4:57 PM  Summary: Stage IV (T2a, N0, M1b)  CHEMOTHERAPY INTENT: palliative  CURRENT # OF CHEMOTHERAPY CYCLES: 5 CURRENT ANTIEMETICS: none  CURRENT SMOKING STATUS: non-smoker/never smoker  ORAL CHEMOTHERAPY AND CONSENT: yes- Gilotrif, Tagrisso  CURRENT BISPHOSPHONATES USE: None  PAIN MANAGEMENT: none  NARCOTICS INDUCED CONSTIPATION: None  LIVING WILL AND CODE STATUS:   INTERVAL HISTORY: Oscar Floyd 50 y.o. male returns to the clinic today for followup visit. The patient is feeling fine  today with no specific complaints. He is currently on Tagrisso 80 mg by mouth daily for the last 4 months and tolerating it fairly well. He has no significant adverse effects. He denied having any rash or diarrhea. He denied having any significant chest pain, shortness breath, cough or hemoptysis. He denied having any weight loss or night sweats. He has no nausea or vomiting, no fever or chills. He has repeat CBC and complaints metabolic panel performed earlier today and he is here for evaluation and discussion of his lab results.  MEDICAL HISTORY: Past Medical History:  Diagnosis Date  . Bone metastases ([place]) 08/26/2015  . Encounter for antineoplastic chemotherapy 01/06/2016  . Hypertension   . Lung cancer ([place])    RUL lung with mets to liver, bone and brain    ALLERGIES:  has No Known Allergies.  MEDICATIONS:  Current Outpatient Prescriptions  Medication Sig Dispense Refill  . calcium-vitamin D (OSCAL) 250-125 MG-UNIT per tablet Take 1 tablet by mouth daily. Reported on 01/12/2016    . esomeprazole (NEXIUM) 20 MG capsule Take by mouth 2 (two) times daily before a meal.     . mirtazapine (REMERON) 30 MG tablet Take 1 tablet (30 mg total) by mouth at bedtime. 30 tablet 2  . naproxen sodium (ANAPROX) 220 MG tablet Take 220 mg by mouth as needed.    Marland Kitchen osimertinib mesylate (TAGRISSO) 80 MG tablet Take 80 mg by mouth.    . ALPRAZolam (XANAX) 1 MG tablet Reported on 02/11/2016    . diazepam (VALIUM) 10 MG tablet Take 1 tablet (10 mg total) by mouth as directed. 30 min before MRI (Patient  not taking: Reported on 05/27/2016) 15 tablet 0  . loperamide (IMODIUM) 2 MG capsule Take 2 mg by mouth as needed for diarrhea or loose stools.     No current facility-administered medications for this visit.     SURGICAL HISTORY:  Past Surgical History:  Procedure Laterality Date  . VASECTOMY    . VIDEO BRONCHOSCOPY Bilateral 03/20/2014   Procedure: VIDEO BRONCHOSCOPY WITH FLUORO;  Surgeon: Rigoberto Noel, MD;   Location: [facility];  Service: Cardiopulmonary;  Laterality: Bilateral;    REVIEW OF SYSTEMS:  A comprehensive review of systems was negative.   PHYSICAL EXAMINATION: General appearance: alert, cooperative and no distress Head: Normocephalic, without obvious abnormality, atraumatic Neck: no adenopathy, no JVD, supple, symmetrical, trachea midline and thyroid not enlarged, symmetric, no tenderness/mass/nodules Lymph nodes: Cervical, supraclavicular, and axillary nodes normal. Resp: clear to auscultation bilaterally Back: symmetric, no curvature. ROM normal. No CVA tenderness. Cardio: regular rate and rhythm, S1, S2 normal, no murmur, click, rub or gallop GI: soft, non-tender; bowel sounds normal; no masses,  no organomegaly Extremities: extremities normal, atraumatic, no cyanosis or edema Neurologic: Alert and oriented X 3, normal strength and tone. Normal symmetric reflexes. Normal coordination and gait  ECOG PERFORMANCE STATUS: 0 - Asymptomatic  Blood pressure (!) 137/92, pulse 83, temperature 98.8 F (37.1 C), temperature source Oral, resp. rate 17, height 6' 2"  (1.88 m), weight 206 lb 9.6 oz (93.7 kg), SpO2 99 %.  LABORATORY DATA: Lab Results  Component Value Date   WBC 4.7 05/27/2016   HGB 14.3 05/27/2016   HCT 42.6 05/27/2016   MCV 87.8 05/27/2016   PLT 140 05/27/2016      Chemistry      Component Value Date/Time   NA 139 05/27/2016 0841   K 4.1 05/27/2016 0841   CO2 23 05/27/2016 0841   BUN 13.6 05/27/2016 0841   CREATININE 1.2 05/27/2016 0841      Component Value Date/Time   CALCIUM 9.3 05/27/2016 0841   ALKPHOS 69 05/27/2016 0841   AST 30 05/27/2016 0841   ALT 28 05/27/2016 0841   BILITOT 0.65 05/27/2016 0841       RADIOGRAPHIC STUDIES: Mr [name] LN Contrast  Result Date: 05/20/2016 CLINICAL DATA:  S RS restaging. Previous metastatic lung cancer treated with whole-brain radiotherapy. EXAM: MRI HEAD WITHOUT AND WITH CONTRAST TECHNIQUE: Multiplanar,  multiecho pulse sequences of the brain and surrounding structures were obtained without and with intravenous contrast. CONTRAST:  21m MULTIHANCE GADOBENATE DIMEGLUMINE 529 MG/ML IV SOLN COMPARISON:  01/30/2016 and previous FINDINGS: Clival, right occipital condyles and upper cervical metastatic disease remains evident, similar to the previous exam. No new lesions. Diffusion imaging does not show any acute or subacute infarction. There are punctate T2 foci within the cerebellum an the cerebral hemispheres related to previously treated metastases. After contrast administration, there is no evidence of residual or recurrent viable tumor. No hemorrhage, hydrocephalus or extra-axial collection. No pituitary mass. There are mucosal inflammatory changes of the left maxillary sinus. IMPRESSION: No evidence of residual, recurrent or new brain metastases. Tiny T2 foci within the brain relate to residual change of previously treated metastases. Osseous metastatic disease of the skullbase and cervical spine as seen previously, nonprogressive. Electronically Signed   By: MNelson ChimesM.D.   On: 05/20/2016 13:23   ASSESSMENT AND PLAN: This is a very pleasant 50years old white male with [name], adenocarcinoma with positive EGFR mutation in exon 19 who was treated with oral Gilotrif  40 mg by mouth daily status post 20 months and tolerating his treatment fairly well except for the grade 1 skin rash and mild paronychia of the toes as well as mild diarrhea once daily. He takes Imodium at regular basis usually in the morning. Unfortunately the recent CT scan of the chest, abdomen and pelvis showed evidence for disease progression with innumerable new and enlarging pulmonary nodules throughout the lungs bilaterally as well as new liver lesions. The plasma test for the EGFR mutation showed EGFR resistant mutation, T790M. He is currently undergoing treatment with Tagrisso 80 mg by mouth daily since  01/15/2016 and tolerating his treatment well. His recent MRI of the brain showed no evidence for disease progression. I recommended for the patient to continue his treatment with Tagrisso with the same dose. I will see him back for follow-up visit in one month for reevaluation with repeat blood work as well as CT scan of the chest, abdomen and pelvis for restaging of his disease.  For the metastatic bone disease, he will continue his treatment with Xgeva after clearance by his dentist from the dental workup. He was advised to call immediately if he has any concerning symptoms in the interval. The patient voices understanding of current disease status and treatment options and is in agreement with the current care plan.  All questions were answered. The patient knows to call the clinic with any problems, questions or concerns. We can certainly see the patient much sooner if necessary.  Disclaimer: This note was dictated with voice recognition software. Similar sounding words can inadvertently be transcribed and may not be corrected upon review.

## 2016-06-23 ENCOUNTER — Telehealth: Payer: Self-pay | Admitting: *Deleted

## 2016-06-23 NOTE — Telephone Encounter (Signed)
Oncology Nurse Navigator Documentation  Oncology Nurse Navigator Flowsheets 06/23/2016  Navigator Location [facility]  Navigator Encounter Type Telephone/I called and spoke with Oscar Floyd.  I asked if he would like to share his story at the [facility] for lung cancer.  He stated he would.    Telephone Outgoing Call  Treatment Phase Treatment  Interventions Other  Acuity Level 1  Time Spent with Patient 15

## 2016-06-30 ENCOUNTER — Ambulatory Visit (HOSPITAL_BASED_OUTPATIENT_CLINIC_OR_DEPARTMENT_OTHER): Payer: BLUE CROSS/BLUE SHIELD | Admitting: Genetic Counselor

## 2016-06-30 ENCOUNTER — Other Ambulatory Visit (HOSPITAL_BASED_OUTPATIENT_CLINIC_OR_DEPARTMENT_OTHER): Payer: BLUE CROSS/BLUE SHIELD

## 2016-06-30 ENCOUNTER — Encounter: Payer: Self-pay | Admitting: Genetic Counselor

## 2016-06-30 DIAGNOSIS — Z809 Family history of malignant neoplasm, unspecified: Secondary | ICD-10-CM | POA: Diagnosis not present

## 2016-06-30 DIAGNOSIS — C7949 Secondary malignant neoplasm of other parts of nervous system: Secondary | ICD-10-CM

## 2016-06-30 DIAGNOSIS — C7931 Secondary malignant neoplasm of brain: Secondary | ICD-10-CM

## 2016-06-30 DIAGNOSIS — C3491 Malignant neoplasm of unspecified part of right bronchus or lung: Secondary | ICD-10-CM

## 2016-06-30 DIAGNOSIS — Z5111 Encounter for antineoplastic chemotherapy: Secondary | ICD-10-CM

## 2016-06-30 DIAGNOSIS — C7951 Secondary malignant neoplasm of bone: Secondary | ICD-10-CM

## 2016-06-30 LAB — COMPREHENSIVE METABOLIC PANEL
ALT: 35 U/L (ref 0–55)
AST: 58 U/L — ABNORMAL HIGH (ref 5–34)
Albumin: 3.6 g/dL (ref 3.5–5.0)
Alkaline Phosphatase: 64 U/L (ref 40–150)
Anion Gap: 6 mEq/L (ref 3–11)
BILIRUBIN TOTAL: 0.65 mg/dL (ref 0.20–1.20)
BUN: 11.4 mg/dL (ref 7.0–26.0)
CHLORIDE: 109 meq/L (ref 98–109)
CO2: 27 meq/L (ref 22–29)
Calcium: 9.8 mg/dL (ref 8.4–10.4)
Creatinine: 1.1 mg/dL (ref 0.7–1.3)
EGFR: 82 mL/min/{1.73_m2} — AB (ref >90)
GLUCOSE: 68 mg/dL — AB (ref 70–140)
POTASSIUM: 4.9 meq/L (ref 3.5–5.1)
SODIUM: 141 meq/L (ref 136–145)
TOTAL PROTEIN: 6.9 g/dL (ref 6.4–8.3)

## 2016-06-30 LAB — CBC WITH DIFFERENTIAL/PLATELET
BASO%: 0.2 % (ref 0.0–2.0)
Basophils Absolute: 0 10*3/uL (ref 0.0–0.1)
EOS ABS: 0.2 10*3/uL (ref 0.0–0.5)
EOS%: 3.9 % (ref 0.0–7.0)
HCT: 42 % (ref 38.4–49.9)
HGB: 14.3 g/dL (ref 13.0–17.1)
LYMPH%: 27.6 % (ref 14.0–49.0)
MCH: 30.1 pg (ref 27.2–33.4)
MCHC: 34 g/dL (ref 32.0–36.0)
MCV: 88.4 fL (ref 79.3–98.0)
MONO#: 0.5 10*3/uL (ref 0.1–0.9)
MONO%: 11.6 % (ref 0.0–14.0)
NEUT%: 56.7 % (ref 39.0–75.0)
NEUTROS ABS: 2.6 10*3/uL (ref 1.5–6.5)
Platelets: 135 10*3/uL — ABNORMAL LOW (ref 140–400)
RBC: 4.75 10*6/uL (ref 4.20–5.82)
RDW: 13.9 % (ref 11.0–14.6)
WBC: 4.6 10*3/uL (ref 4.0–10.3)
lymph#: 1.3 10*3/uL (ref 0.9–3.3)

## 2016-06-30 NOTE — Progress Notes (Signed)
REFERRING PROVIDER: [name], MD [address] [place],  [address]   Oscar Bears, MD  Oscar Floyd, C-PA  Oscar Pita, MD  PRIMARY PROVIDER:  [name], MD  PRIMARY REASON FOR VISIT:  1. Non-small cell cancer of right lung ([place])   2. Family history of cancer      HISTORY OF PRESENT ILLNESS:   Oscar Floyd, a 50 y.o. male, was seen for a [place] cancer genetics consultation at the request of Dr. Julien Floyd due to a personal and family history of cancer.  Oscar Floyd presents to clinic today to discuss the possibility of a hereditary predisposition to cancer, genetic testing, and to further clarify his future cancer risks, as well as potential cancer risks for family members.   In 2015, at the age of 50, Oscar Floyd was diagnosed with non-small cell cancer of the lung. This was treated with chemotherapy, and whole brain radiation.  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     CANCER HISTORY:   No history exists.     RISK FACTORS:  Colonoscopy: no; not examined. Prostate cancer: no, no screening  Past Medical History:  Diagnosis Date  . Bone metastases ([place]) 08/26/2015  . Encounter for antineoplastic chemotherapy 01/06/2016  . Family history of cancer   . Hypertension   . Lung cancer ([place])    RUL lung with mets to liver, bone and brain    Past Surgical History:  Procedure Laterality Date  . VASECTOMY    . VIDEO BRONCHOSCOPY Bilateral 03/20/2014   Procedure: VIDEO BRONCHOSCOPY WITH FLUORO;  Surgeon: Oscar Noel, MD;  Location: [facility];  Service: Cardiopulmonary;  Laterality: Bilateral;    Social History   Social History  . Marital status: Married    Spouse name: N/A  . Number of children: 2  . Years of education: N/A   Social History Main Topics  . Smoking status: Never Smoker  . Smokeless tobacco: Never Used  . Alcohol use Yes   Comment: socially  . Drug use: No  . Sexual activity: Yes   Other Topics Concern  . None   Social History Narrative  . None     FAMILY HISTORY:  We obtained a detailed, 4-generation family history.  Significant diagnoses are listed below: Family History  Problem Relation Age of Onset  . Cancer Father 45    liposarcoma age 7  . Cancer Mother 41    gist  . Cancer Paternal Grandfather     lung cancer; heavy smoker; also had abdominal cancer in 82's  . Stroke Maternal Grandmother   . Heart disease Maternal Grandfather   . Cancer Other     multiple brothers of PGF with unknown cancer    The patient has two sons, ages 48 and 20 who are cancer free.  He has one brother who is a smoker who is currently cancer free.  His father passed away from a liposarcoma that was diagnosed at age 46 and died at 68.  His father was an only child.  The patient's paternal grandfather died of an unknown cancer, possibly abdominal.  His grandfather had 4-5 brothers, and two had unknown cancers.  His grandmother died at 76 from old age.  The patient's mother had a GIST tumor in her stomach at age 61.  She has not had signs of PGL.  She has one sister who died in a  car accident at 74.  There is no other cancer reported in the family.  Patient's maternal ancestors are of [place] descent, and paternal ancestors are of [place] descent. There is no reported Ashkenazi Jewish ancestry. There is no known consanguinity.  GENETIC COUNSELING ASSESSMENT: Oscar Floyd is a 50 y.o. male with a personal history of non-small cell lung cancer and a family history of cancer which is somewhat suggestive of a hereditary cancer syndrome and predisposition to cancer. We, therefore, discussed and recommended the following at today's visit.   DISCUSSION: We discussed that about 5-10% of cancer is hereditary.  When thinking about lung cancer genetics, the testing is only just emerging.  We know that there are genetic changes in EGFR  gene that can be associated with a hereditary form of lung cancer.  Typically we see these in his tumor testing.  Oscar Floyd underwent EGFR tumor testing in 2015 and was found to have a deletion in exon 19.  However, the more commonly associated mutation T790M mutation was not found (although it appears recently he has developed this mutation).  We discussed that his father had a sarcoma, and that the only way I could connect the sarcoma with his lung cancer is through TP53 testing.  We also discussed the possible, but low, risk for a GIST tumor syndrome such as a paraganglioma syndrome.  In review of the literature, and suggestion that all sarcomas should be tested for TP53, we offered genetic testing of this for Oscar Floyd.  We also offered genetic testing of EGFR to ensure that the T790M mutation is not present in the germline, as well as the PGL genes.  We reviewed the characteristics, features and inheritance patterns of hereditary cancer syndromes. We also discussed genetic testing, including the appropriate family members to test, the process of testing, insurance coverage and turn-around-time for results. We discussed the implications of a negative, positive and/or variant of uncertain significant result. We recommended Oscar Floyd pursue genetic testing for the Common hereditary gene panel. The Hereditary Gene Panel and EGFR gene testing offered by [facility] includes sequencing and/or deletion duplication testing of the following 43 genes: APC, ATM, AXIN2, BARD1, BMPR1A, BRCA1, BRCA2, BRIP1, CDH1, CDKN2A, CHEK2, DICER1, EGFR, EPCAM, GREM1, KIT, MEN1, MLH1, MSH2, MSH6, MUTYH, NBN, NF1, PALB2, PDGFRA, PMS2, POLD1, POLE, PTEN, RAD50, RAD51C, RAD51D, SDHA, SDHB, SDHC, SDHD, SMAD4, SMARCA4. STK11, TP53, TSC1, TSC2, and VHL.     Currently there are no guidelines for when EGFR testing in the germline should be covered, or specifically when TP53 testing, outside of breast cancer, would be covered.  We discussed that we  will have the lab perform a billing investigation and if his out of pocket cost for testing is over $100, the laboratory will call and confirm whether he wants to proceed with testing.  If the out of pocket cost of testing is less than $100 he will be billed by the genetic testing laboratory.   PLAN: After considering the risks, benefits, and limitations, Oscar Floyd  provided informed consent to pursue genetic testing and the blood sample was sent to [facility] for analysis of the common hereditary cancer panel and EGFR. Results should be available within approximately 2-3 weeks' time, at which point they will be disclosed by telephone to Oscar Floyd, as will any additional recommendations warranted by these results. Oscar Floyd will receive a summary of his genetic counseling visit and a copy of his results once available. This information will also be available in Epic.  We encouraged Oscar Floyd to remain in contact with cancer genetics annually so that we can continuously update the family history and inform him of any changes in cancer genetics and testing that may be of benefit for his family. Oscar Floyd questions were answered to his satisfaction today. Our contact information was provided should additional questions or concerns arise.  Lastly, we encouraged Oscar Floyd to remain in contact with cancer genetics annually so that we can continuously update the family history and inform him of any changes in cancer genetics and testing that may be of benefit for this family.   Mr.  Floyd questions were answered to his satisfaction today. Our contact information was provided should additional questions or concerns arise. Thank you for the referral and allowing [place] to share in the care of your patient.   Oscar Floyd P. Florene Glen, [place], [facility] Certified Genetic Counselor [email or web address] phone: [phone]  The patient was seen for a total of 60 minutes in face-to-face genetic counseling.  This patient was  discussed with Drs. Magrinat, Lindi Adie and/or Burr Medico who agrees with the above.    _______________________________________________________________________ For Office Staff:  Number of people involved in session: 1 Was an Intern/ student involved with case: yes [employer]

## 2016-07-06 ENCOUNTER — Other Ambulatory Visit: Payer: Self-pay | Admitting: Medical Oncology

## 2016-07-06 DIAGNOSIS — C3491 Malignant neoplasm of unspecified part of right bronchus or lung: Secondary | ICD-10-CM

## 2016-07-07 ENCOUNTER — Ambulatory Visit (HOSPITAL_COMMUNITY)
Admission: RE | Admit: 2016-07-07 | Discharge: 2016-07-07 | Disposition: A | Discharge status: Home / Self Care | Payer: BLUE CROSS/BLUE SHIELD | Source: Ambulatory Visit | Attending: Internal Medicine | Admitting: Internal Medicine

## 2016-07-07 ENCOUNTER — Telehealth: Payer: Self-pay | Admitting: Genetic Counselor

## 2016-07-07 ENCOUNTER — Ambulatory Visit (HOSPITAL_BASED_OUTPATIENT_CLINIC_OR_DEPARTMENT_OTHER): Payer: BLUE CROSS/BLUE SHIELD | Admitting: Internal Medicine

## 2016-07-07 ENCOUNTER — Ambulatory Visit: Payer: Self-pay | Admitting: Genetic Counselor

## 2016-07-07 ENCOUNTER — Encounter: Payer: Self-pay | Admitting: Internal Medicine

## 2016-07-07 ENCOUNTER — Other Ambulatory Visit (HOSPITAL_BASED_OUTPATIENT_CLINIC_OR_DEPARTMENT_OTHER): Payer: BLUE CROSS/BLUE SHIELD

## 2016-07-07 ENCOUNTER — Encounter (HOSPITAL_COMMUNITY): Payer: Self-pay

## 2016-07-07 ENCOUNTER — Encounter: Payer: Self-pay | Admitting: Genetic Counselor

## 2016-07-07 ENCOUNTER — Telehealth: Payer: Self-pay | Admitting: Internal Medicine

## 2016-07-07 VITALS — BP 143/80 | HR 60 | Temp 98.6°F | Resp 17 | Ht 74.0 in | Wt 210.9 lb

## 2016-07-07 DIAGNOSIS — C3491 Malignant neoplasm of unspecified part of right bronchus or lung: Secondary | ICD-10-CM

## 2016-07-07 DIAGNOSIS — Z1379 Encounter for other screening for genetic and chromosomal anomalies: Secondary | ICD-10-CM | POA: Insufficient documentation

## 2016-07-07 DIAGNOSIS — C7931 Secondary malignant neoplasm of brain: Secondary | ICD-10-CM

## 2016-07-07 DIAGNOSIS — C7951 Secondary malignant neoplasm of bone: Secondary | ICD-10-CM | POA: Diagnosis not present

## 2016-07-07 DIAGNOSIS — C7949 Secondary malignant neoplasm of other parts of nervous system: Secondary | ICD-10-CM | POA: Diagnosis present

## 2016-07-07 DIAGNOSIS — C3411 Malignant neoplasm of upper lobe, right bronchus or lung: Secondary | ICD-10-CM

## 2016-07-07 DIAGNOSIS — C787 Secondary malignant neoplasm of liver and intrahepatic bile duct: Secondary | ICD-10-CM | POA: Diagnosis not present

## 2016-07-07 DIAGNOSIS — C3410 Malignant neoplasm of upper lobe, unspecified bronchus or lung: Secondary | ICD-10-CM | POA: Diagnosis not present

## 2016-07-07 DIAGNOSIS — Z809 Family history of malignant neoplasm, unspecified: Secondary | ICD-10-CM

## 2016-07-07 DIAGNOSIS — Z5111 Encounter for antineoplastic chemotherapy: Secondary | ICD-10-CM

## 2016-07-07 LAB — CBC WITH DIFFERENTIAL/PLATELET
BASO%: 0.5 % (ref 0.0–2.0)
BASOS ABS: 0 10*3/uL (ref 0.0–0.1)
EOS%: 3.4 % (ref 0.0–7.0)
Eosinophils Absolute: 0.2 10*3/uL (ref 0.0–0.5)
HEMATOCRIT: 42.6 % (ref 38.4–49.9)
HEMOGLOBIN: 14.2 g/dL (ref 13.0–17.1)
LYMPH#: 1.2 10*3/uL (ref 0.9–3.3)
LYMPH%: 27 % (ref 14.0–49.0)
MCH: 29.6 pg (ref 27.2–33.4)
MCHC: 33.3 g/dL (ref 32.0–36.0)
MCV: 89 fL (ref 79.3–98.0)
MONO#: 0.4 10*3/uL (ref 0.1–0.9)
MONO%: 9.3 % (ref 0.0–14.0)
NEUT#: 2.7 10*3/uL (ref 1.5–6.5)
NEUT%: 59.8 % (ref 39.0–75.0)
Platelets: 134 10*3/uL — ABNORMAL LOW (ref 140–400)
RBC: 4.79 10*6/uL (ref 4.20–5.82)
RDW: 14.3 % (ref 11.0–14.6)
WBC: 4.4 10*3/uL (ref 4.0–10.3)

## 2016-07-07 LAB — COMPREHENSIVE METABOLIC PANEL
ALBUMIN: 3.6 g/dL (ref 3.5–5.0)
ALK PHOS: 69 U/L (ref 40–150)
ALT: 24 U/L (ref 0–55)
AST: 29 U/L (ref 5–34)
Anion Gap: 8 mEq/L (ref 3–11)
BILIRUBIN TOTAL: 0.69 mg/dL (ref 0.20–1.20)
BUN: 12.2 mg/dL (ref 7.0–26.0)
CALCIUM: 9.6 mg/dL (ref 8.4–10.4)
CO2: 25 mEq/L (ref 22–29)
CREATININE: 1.2 mg/dL (ref 0.7–1.3)
Chloride: 108 mEq/L (ref 98–109)
EGFR: 72 mL/min/{1.73_m2} — ABNORMAL LOW (ref >90)
Glucose: 72 mg/dl (ref 70–140)
Potassium: 4.2 mEq/L (ref 3.5–5.1)
Sodium: 141 mEq/L (ref 136–145)
Total Protein: 6.9 g/dL (ref 6.4–8.3)

## 2016-07-07 MED ORDER — IOPAMIDOL (ISOVUE-300) INJECTION 61%
100.0000 mL | Freq: Once | INTRAVENOUS | Status: AC | PRN
  Administered 2016-07-07: 100 mL via INTRAVENOUS

## 2016-07-07 MED ORDER — IOPAMIDOL (ISOVUE-300) INJECTION 61%
30.0000 mL | Freq: Once | INTRAVENOUS | Status: AC | PRN
  Administered 2016-07-07: 30 mL via ORAL

## 2016-07-07 NOTE — Telephone Encounter (Signed)
GAVE PATIENT AVS REPORT AND APPOINTMENTS FOR November.  °

## 2016-07-07 NOTE — Progress Notes (Signed)
HPI: Oscar Floyd was previously seen in the [place] clinic due to a personal and family history of cancer and concerns regarding a hereditary predisposition to cancer. Please refer to our prior cancer genetics clinic note for more information regarding Oscar Floyd's medical, social and family histories, and our assessment and recommendations, at the time. Oscar Floyd recent genetic test results were disclosed to him, as were recommendations warranted by these results. These results and recommendations are discussed in more detail below.  FAMILY HISTORY:  We obtained a detailed, 4-generation family history.  Significant diagnoses are listed below: Family History  Problem Relation Age of Onset  . Cancer Father 59    liposarcoma age 17  . Cancer Mother 21    gist  . Cancer Paternal Grandfather     lung cancer; heavy smoker; also had abdominal cancer in 52's  . Stroke Maternal Grandmother   . Heart disease Maternal Grandfather   . Cancer Other     multiple brothers of PGF with unknown cancer    The patient has two sons, ages 4 and 31 who are cancer free.  He has one brother who is a smoker who is currently cancer free.  His father passed away from a liposarcoma that was diagnosed at age 34 and died at 55.  His father was an only child.  The patient's paternal grandfather died of an unknown cancer, possibly abdominal.  His grandfather had 4-5 brothers, and two had unknown cancers.  His grandmother died at 50 from old age.  The patient's mother had a GIST tumor in her stomach at age 43.  She has not had signs of PGL.  She has one sister who died in a car accident at 32.  There is no other cancer reported in the family.  Patient's maternal ancestors are of [place] descent, and paternal ancestors are of [place] descent. There is no reported Ashkenazi Jewish ancestry. There is no known consanguinity.  GENETIC TEST RESULTS: At the time of Oscar Floyd's visit, we recommended he pursue genetic  testing of the Common hereditary gene panel with the addition of the EGFR [id number] mutation found in hereditary lung cancer. The Hereditary Gene Panel offered by [facility] includes sequencing and/or deletion duplication testing of the following 43 genes: APC, ATM, AXIN2, BARD1, BMPR1A, BRCA1, BRCA2, BRIP1, CDH1, CDKN2A, CHEK2, DICER1, EGFR, EPCAM, GREM1, KIT, MEN1, MLH1, MSH2, MSH6, MUTYH, NBN, NF1, PALB2, PDGFRA, PMS2, POLD1, POLE, PTEN, RAD50, RAD51C, RAD51D, SDHA, SDHB, SDHC, SDHD, SMAD4, SMARCA4. STK11, TP53, TSC1, TSC2, and VHL.  The report date is July 07, 2016.  Genetic testing was normal, and did not reveal a deleterious mutation in these genes. The test report has been scanned into EPIC and is located under the Molecular Pathology section of the Results Review tab.   We discussed with Oscar Floyd that since the current genetic testing is not perfect, it is possible there may be a gene mutation in one of these genes that current testing cannot detect, but that chance is small. We also discussed, that it is possible that another gene that has not yet been discovered, or that we have not yet tested, is responsible for the cancer diagnoses in the family, and it is, therefore, important to remain in touch with cancer genetics in the future so that we can continue to offer Oscar Floyd the most up to date genetic testing.   CANCER SCREENING RECOMMENDATIONS:  This result is reassuring and indicates that Oscar Floyd likely does not  have an increased risk for a future cancer due to a mutation in one of these genes. This normal test also suggests that Oscar Floyd's cancer was most likely not due to an inherited predisposition associated with one of these genes.  Most cancers happen by chance and this negative test suggests that his cancer falls into this category.  We, therefore, recommended he continue to follow the cancer management and screening guidelines provided by his oncology and primary healthcare provider.    RECOMMENDATIONS FOR FAMILY MEMBERS: Women in this family might be at some increased risk of developing cancer, over the general population risk, simply due to the family history of cancer. We recommended women in this family have a yearly mammogram beginning at age 31, or 65 years younger than the earliest onset of cancer, an annual clinical breast exam, and perform monthly breast self-exams. Women in this family should also have a gynecological exam as recommended by their primary provider. All family members should have a colonoscopy by age 86.  FOLLOW-UP: Lastly, we discussed with Oscar Floyd that cancer genetics is a rapidly advancing field and it is possible that new genetic tests will be appropriate for him and/or his family members in the future. We encouraged him to remain in contact with cancer genetics on an annual basis so we can update his personal and family histories and let him know of advances in cancer genetics that may benefit this family.   Our contact number was provided. Oscar Floyd questions were answered to his satisfaction, and he knows he is welcome to call us at anytime with additional questions or concerns.   Roma Kayser, MS, [facility] Certified Genetic Counselor [email or web address]

## 2016-07-07 NOTE — Telephone Encounter (Signed)
Revealed negative genetic testing on the common hereditary cancer panel through [facility].  We also tested for single site mutation in EGFR that can be associated with hereditary lung cancer.  This was also negative.  Explained that while this is negative, we did not come up with an answer on why there is cancer in the family. Patient voiced his understanding.

## 2016-07-07 NOTE — Progress Notes (Signed)
[place] [phone]   Fax:(336) [phone]  OFFICE PROGRESS NOTE  [name], MD [address] [place] [address]  DIAGNOSIS: stage IV (T2a, N0, M1b) non-small cell lung cancer consistent with poorly differentiated adenocarcinoma with positive EGFR mutation with deletion in exon 19, diagnosed in July of 2015 and presented with right upper lobe lung mass in addition to extensive liver, brain and bone metastases.  He had disease progression and development of EGFR T790M resistant mutation in April 2017. Primary site: Lung (Right)  Staging method: AJCC 7th Edition  Clinical: Stage IV (T2a, N0, M1b) signed by [name], MD on 03/26/2014 4:57 PM  Summary: Stage IV (T2a, N0, M1b)   PRIOR THERAPY:  1) Status post whole brain irradiation under the care Dr. Tammi Klippel completed 04/12/2014. 2) Gilotrif 40 mg po daily - therapy beginning 04/03/2014. Status post approximately 20 months of therapy discontinued secondary to disease progression and development of EGFR T790M resistant mutation.   CURRENT THERAPY:  1) Tagrisso 80 mg by mouth daily. First dose started 01/15/2016. Status post 5 months of treatment. 2) Xgeva 120 mcg subcutaneously on monthly basis.   DISEASE STAGE:  Lung cancer  Primary site: Lung (Right)  Staging method: AJCC 7th Edition  Clinical: Stage IV (T2a, N0, M1b) signed by [name], MD on 03/26/2014 4:57 PM  Summary: Stage IV (T2a, N0, M1b)  CHEMOTHERAPY INTENT: palliative  CURRENT # OF CHEMOTHERAPY CYCLES: 6 CURRENT ANTIEMETICS: none  CURRENT SMOKING STATUS: non-smoker/never smoker  ORAL CHEMOTHERAPY AND CONSENT: yes- Gilotrif, Tagrisso  CURRENT BISPHOSPHONATES USE: None  PAIN MANAGEMENT: none  NARCOTICS INDUCED CONSTIPATION: None  LIVING WILL AND CODE STATUS:   INTERVAL HISTORY: Oscar Floyd 50 y.o. male returns to the clinic today for followup visit. The patient is feeling fine  today with no specific complaints. He is currently on Tagrisso 80 mg by mouth daily status post 5 months of treatment and tolerating it fairly well. He has no significant adverse effects. He denied having any rash or diarrhea. He denied having any significant chest pain, shortness of breath, cough or hemoptysis. He denied having any weight loss or night sweats. He has no nausea or vomiting, no fever or chills. He has repeat lab work as well as CT scan of the chest, abdomen and pelvis performed earlier today and he is here for evaluation and discussion of his lab and his scan results.  MEDICAL HISTORY: Past Medical History:  Diagnosis Date  . Bone metastases ([place]) 08/26/2015  . Encounter for antineoplastic chemotherapy 01/06/2016  . Family history of cancer   . Hypertension   . Lung cancer ([place])    RUL lung with mets to liver, bone and brain    ALLERGIES:  has No Known Allergies.  MEDICATIONS:  Current Outpatient Prescriptions  Medication Sig Dispense Refill  . ALPRAZolam (XANAX) 1 MG tablet Reported on 02/11/2016    . calcium-vitamin D (OSCAL) 250-125 MG-UNIT per tablet Take 1 tablet by mouth daily. Reported on 01/12/2016    . diazepam (VALIUM) 10 MG tablet Take 1 tablet (10 mg total) by mouth as directed. 30 min before MRI (Patient not taking: Reported on 05/27/2016) 15 tablet 0  . esomeprazole (NEXIUM) 20 MG capsule Take by mouth 2 (two) times daily before a meal.     . loperamide (IMODIUM) 2 MG capsule Take 2 mg by mouth as needed for diarrhea or loose stools.    . mirtazapine (  REMERON) 30 MG tablet Take 1 tablet (30 mg total) by mouth at bedtime. 30 tablet 2  . naproxen sodium (ANAPROX) 220 MG tablet Take 220 mg by mouth as needed.    Marland Kitchen osimertinib mesylate (TAGRISSO) 80 MG tablet Take 80 mg by mouth.     No current facility-administered medications for this visit.     SURGICAL HISTORY:  Past Surgical History:  Procedure Laterality Date  . VASECTOMY    . VIDEO BRONCHOSCOPY Bilateral  03/20/2014   Procedure: VIDEO BRONCHOSCOPY WITH FLUORO;  Surgeon: Rigoberto Noel, MD;  Location: [facility];  Service: Cardiopulmonary;  Laterality: Bilateral;    REVIEW OF SYSTEMS:  Constitutional: negative Eyes: negative Ears, nose, mouth, throat, and face: negative Respiratory: negative Cardiovascular: negative Gastrointestinal: negative Genitourinary:negative Integument/breast: negative Hematologic/lymphatic: negative Musculoskeletal:negative Neurological: negative Behavioral/Psych: negative Endocrine: negative Allergic/Immunologic: negative   PHYSICAL EXAMINATION: General appearance: alert, cooperative and no distress Head: Normocephalic, without obvious abnormality, atraumatic Neck: no adenopathy, no JVD, supple, symmetrical, trachea midline and thyroid not enlarged, symmetric, no tenderness/mass/nodules Lymph nodes: Cervical, supraclavicular, and axillary nodes normal. Resp: clear to auscultation bilaterally Back: symmetric, no curvature. ROM normal. No CVA tenderness. Cardio: regular rate and rhythm, S1, S2 normal, no murmur, click, rub or gallop GI: soft, non-tender; bowel sounds normal; no masses,  no organomegaly Extremities: extremities normal, atraumatic, no cyanosis or edema Neurologic: Alert and oriented X 3, normal strength and tone. Normal symmetric reflexes. Normal coordination and gait  ECOG PERFORMANCE STATUS: 0 - Asymptomatic  Blood pressure (!) 143/80, pulse 60, temperature 98.6 F (37 C), temperature source Oral, resp. rate 17, height 6' 2"  (1.88 m), weight 210 lb 14.4 oz (95.7 kg), SpO2 100 %.  LABORATORY DATA: Lab Results  Component Value Date   WBC 4.4 07/07/2016   HGB 14.2 07/07/2016   HCT 42.6 07/07/2016   MCV 89.0 07/07/2016   PLT 134 (L) 07/07/2016      Chemistry      Component Value Date/Time   NA 141 07/07/2016 0903   K 4.2 07/07/2016 0903   CO2 25 07/07/2016 0903   BUN 12.2 07/07/2016 0903   CREATININE 1.2 07/07/2016 0903        Component Value Date/Time   CALCIUM 9.6 07/07/2016 0903   ALKPHOS 69 07/07/2016 0903   AST 29 07/07/2016 0903   ALT 24 07/07/2016 0903   BILITOT 0.69 07/07/2016 0903       RADIOGRAPHIC STUDIES: Ct Chest W Contrast  Result Date: 07/07/2016 CLINICAL DATA:  Lung cancer, ongoing chemotherapy. EXAM: CT CHEST, ABDOMEN, AND PELVIS WITH CONTRAST TECHNIQUE: Multidetector CT imaging of the chest, abdomen and pelvis was performed following the standard protocol during bolus administration of intravenous contrast. CONTRAST:  66m ISOVUE-300 IOPAMIDOL (ISOVUE-300) INJECTION 61%, 1013mISOVUE-300 IOPAMIDOL (ISOVUE-300) INJECTION 61% COMPARISON:  03/19/2016. FINDINGS: CT CHEST FINDINGS Cardiovascular: Vascular structures are unremarkable. Heart size normal. No pericardial effusion. Mediastinum/Nodes: No pathologically enlarged mediastinal, hilar or axillary lymph nodes. Esophagus is grossly unremarkable. Lungs/Pleura: 1.6 cm nodule in the anterior right upper lobe is stable. Mild surrounding volume loss and architectural distortion. Diffuse pulmonary parenchymal micro nodularity, right greater than left, grossly stable. Calcified granuloma in the right lower lobe. No pleural fluid. Airway is unremarkable. Musculoskeletal: Diffuse sclerotic osseous metastatic disease is grossly stable. CT ABDOMEN PELVIS FINDINGS Hepatobiliary: Liver and gallbladder are unremarkable. No biliary ductal dilatation. Pancreas: Negative. Spleen: Negative. Adrenals/Urinary Tract: Adrenal glands are unremarkable. Sub cm low-attenuation lesion in the right kidney is too small to definitively characterize but a  cyst is most likely. Left kidney is unremarkable. Ureters are decompressed. Bladder is grossly unremarkable. Stomach/Bowel: Tiny hiatal hernia. Stomach, small bowel, appendix and colon are unremarkable. Vascular/Lymphatic: Vascular structures are unremarkable. No pathologically enlarged lymph nodes. Reproductive: Prostate is  visualized. Other: No free fluid.  Mesenteries and peritoneum are unremarkable. Musculoskeletal: Sclerotic osseous metastatic disease is seen throughout, as before. IMPRESSION: 1. Right upper lobe primary bronchogenic carcinoma, stable in size. Diffuse pulmonary parenchymal micro nodularity, right greater than left, similar, indicative of stable metastatic disease. 2. Osseous metastatic disease is diffuse and grossly stable. Electronically Signed   By: Lorin Picket M.D.   On: 07/07/2016 13:37   Ct Abdomen Pelvis W Contrast  Result Date: 07/07/2016 CLINICAL DATA:  Lung cancer, ongoing chemotherapy. EXAM: CT CHEST, ABDOMEN, AND PELVIS WITH CONTRAST TECHNIQUE: Multidetector CT imaging of the chest, abdomen and pelvis was performed following the standard protocol during bolus administration of intravenous contrast. CONTRAST:  37m ISOVUE-300 IOPAMIDOL (ISOVUE-300) INJECTION 61%, 1070mISOVUE-300 IOPAMIDOL (ISOVUE-300) INJECTION 61% COMPARISON:  03/19/2016. FINDINGS: CT CHEST FINDINGS Cardiovascular: Vascular structures are unremarkable. Heart size normal. No pericardial effusion. Mediastinum/Nodes: No pathologically enlarged mediastinal, hilar or axillary lymph nodes. Esophagus is grossly unremarkable. Lungs/Pleura: 1.6 cm nodule in the anterior right upper lobe is stable. Mild surrounding volume loss and architectural distortion. Diffuse pulmonary parenchymal micro nodularity, right greater than left, grossly stable. Calcified granuloma in the right lower lobe. No pleural fluid. Airway is unremarkable. Musculoskeletal: Diffuse sclerotic osseous metastatic disease is grossly stable. CT ABDOMEN PELVIS FINDINGS Hepatobiliary: Liver and gallbladder are unremarkable. No biliary ductal dilatation. Pancreas: Negative. Spleen: Negative. Adrenals/Urinary Tract: Adrenal glands are unremarkable. Sub cm low-attenuation lesion in the right kidney is too small to definitively characterize but a cyst is most likely. Left  kidney is unremarkable. Ureters are decompressed. Bladder is grossly unremarkable. Stomach/Bowel: Tiny hiatal hernia. Stomach, small bowel, appendix and colon are unremarkable. Vascular/Lymphatic: Vascular structures are unremarkable. No pathologically enlarged lymph nodes. Reproductive: Prostate is visualized. Other: No free fluid.  Mesenteries and peritoneum are unremarkable. Musculoskeletal: Sclerotic osseous metastatic disease is seen throughout, as before. IMPRESSION: 1. Right upper lobe primary bronchogenic carcinoma, stable in size. Diffuse pulmonary parenchymal micro nodularity, right greater than left, similar, indicative of stable metastatic disease. 2. Osseous metastatic disease is diffuse and grossly stable. Electronically Signed   By: MeLorin Picket.D.   On: 07/07/2016 13:37   ASSESSMENT AND PLAN: This is a very pleasant [age] old white male with [name], adenocarcinoma with positive EGFR mutation in exon 19 who was treated with oral Gilotrif 40 mg by mouth daily status post 20 months and tolerating his treatment fairly well except for the grade 1 skin rash and mild paronychia of the toes as well as mild diarrhea once daily. He takes Imodium at regular basis usually in the morning. Unfortunately the recent CT scan of the chest, abdomen and pelvis showed evidence for disease progression with innumerable new and enlarging pulmonary nodules throughout the lungs bilaterally as well as new liver lesions. The plasma test for the EGFR mutation showed EGFR resistant mutation, T790M. He is currently undergoing treatment with Tagrisso 80 mg by mouth daily since 01/15/2016 and tolerating his treatment well.  Restaging CT scan of the chest, abdomen and pelvis showed no evidence for disease progression. I discussed the scan results with the patient today. I recommended for the patient to continue his treatment with Tagrisso with the same dose. I will see him back for  follow-up visit in one month for reevaluation with repeat blood work.  For the metastatic bone disease, he will continue his treatment with Xgeva after clearance by his dentist from the dental workup. He was advised to call immediately if he has any concerning symptoms in the interval. The patient voices understanding of current disease status and treatment options and is in agreement with the current care plan.  All questions were answered. The patient knows to call the clinic with any problems, questions or concerns. We can certainly see the patient much sooner if necessary.  Disclaimer: This note was dictated with voice recognition software. Similar sounding words can inadvertently be transcribed and may not be corrected upon review.

## 2016-07-26 ENCOUNTER — Other Ambulatory Visit: Payer: Self-pay | Admitting: Medical Oncology

## 2016-07-26 DIAGNOSIS — C7951 Secondary malignant neoplasm of bone: Secondary | ICD-10-CM

## 2016-07-26 DIAGNOSIS — Z5111 Encounter for antineoplastic chemotherapy: Secondary | ICD-10-CM

## 2016-07-26 DIAGNOSIS — C7931 Secondary malignant neoplasm of brain: Secondary | ICD-10-CM

## 2016-07-26 DIAGNOSIS — C3491 Malignant neoplasm of unspecified part of right bronchus or lung: Secondary | ICD-10-CM

## 2016-07-26 DIAGNOSIS — C7949 Secondary malignant neoplasm of other parts of nervous system: Secondary | ICD-10-CM

## 2016-07-26 MED ORDER — OSIMERTINIB MESYLATE 80 MG PO TABS: 80.0000 mg | ORAL_TABLET | Freq: Every day | ORAL | 0 refills | Status: DC

## 2016-07-27 ENCOUNTER — Telehealth: Payer: Self-pay | Admitting: *Deleted

## 2016-07-27 NOTE — Telephone Encounter (Signed)
Call received @ 1100 Pharmacy called requesting a refill on pt.  TAGRISSO '80mg'$ 

## 2016-07-27 NOTE — Telephone Encounter (Signed)
[facility] confirmed refill received yesterday.

## 2016-08-11 ENCOUNTER — Other Ambulatory Visit (HOSPITAL_BASED_OUTPATIENT_CLINIC_OR_DEPARTMENT_OTHER): Payer: BLUE CROSS/BLUE SHIELD | Admitting: Medical Oncology

## 2016-08-11 ENCOUNTER — Encounter: Payer: Self-pay | Admitting: Internal Medicine

## 2016-08-11 ENCOUNTER — Telehealth: Payer: Self-pay | Admitting: Internal Medicine

## 2016-08-11 ENCOUNTER — Other Ambulatory Visit (HOSPITAL_BASED_OUTPATIENT_CLINIC_OR_DEPARTMENT_OTHER): Payer: BLUE CROSS/BLUE SHIELD

## 2016-08-11 ENCOUNTER — Ambulatory Visit (HOSPITAL_BASED_OUTPATIENT_CLINIC_OR_DEPARTMENT_OTHER): Payer: BLUE CROSS/BLUE SHIELD | Admitting: Internal Medicine

## 2016-08-11 VITALS — BP 121/89 | HR 88 | Temp 98.1°F | Resp 18 | Wt 212.3 lb

## 2016-08-11 DIAGNOSIS — Z23 Encounter for immunization: Secondary | ICD-10-CM

## 2016-08-11 DIAGNOSIS — R05 Cough: Secondary | ICD-10-CM

## 2016-08-11 DIAGNOSIS — C7931 Secondary malignant neoplasm of brain: Secondary | ICD-10-CM | POA: Diagnosis not present

## 2016-08-11 DIAGNOSIS — C7951 Secondary malignant neoplasm of bone: Secondary | ICD-10-CM

## 2016-08-11 DIAGNOSIS — Z5111 Encounter for antineoplastic chemotherapy: Secondary | ICD-10-CM

## 2016-08-11 DIAGNOSIS — L03039 Cellulitis of unspecified toe: Secondary | ICD-10-CM

## 2016-08-11 DIAGNOSIS — C3491 Malignant neoplasm of unspecified part of right bronchus or lung: Secondary | ICD-10-CM

## 2016-08-11 DIAGNOSIS — C787 Secondary malignant neoplasm of liver and intrahepatic bile duct: Secondary | ICD-10-CM

## 2016-08-11 DIAGNOSIS — C3411 Malignant neoplasm of upper lobe, right bronchus or lung: Secondary | ICD-10-CM

## 2016-08-11 DIAGNOSIS — L270 Generalized skin eruption due to drugs and medicaments taken internally: Secondary | ICD-10-CM

## 2016-08-11 DIAGNOSIS — R059 Cough, unspecified: Secondary | ICD-10-CM

## 2016-08-11 DIAGNOSIS — R197 Diarrhea, unspecified: Secondary | ICD-10-CM

## 2016-08-11 LAB — CBC WITH DIFFERENTIAL/PLATELET
BASO%: 0.2 % (ref 0.0–2.0)
BASOS ABS: 0 10*3/uL (ref 0.0–0.1)
EOS ABS: 0.2 10*3/uL (ref 0.0–0.5)
EOS%: 3.8 % (ref 0.0–7.0)
HCT: 42.7 % (ref 38.4–49.9)
HGB: 14.5 g/dL (ref 13.0–17.1)
LYMPH%: 27.8 % (ref 14.0–49.0)
MCH: 30 pg (ref 27.2–33.4)
MCHC: 34 g/dL (ref 32.0–36.0)
MCV: 88.4 fL (ref 79.3–98.0)
MONO#: 0.4 10*3/uL (ref 0.1–0.9)
MONO%: 8.2 % (ref 0.0–14.0)
NEUT#: 2.6 10*3/uL (ref 1.5–6.5)
NEUT%: 60 % (ref 39.0–75.0)
NRBC: 0 % (ref 0–0)
PLATELETS: 155 10*3/uL (ref 140–400)
RBC: 4.83 10*6/uL (ref 4.20–5.82)
RDW: 13.4 % (ref 11.0–14.6)
WBC: 4.3 10*3/uL (ref 4.0–10.3)
lymph#: 1.2 10*3/uL (ref 0.9–3.3)

## 2016-08-11 LAB — COMPREHENSIVE METABOLIC PANEL
ALBUMIN: 3.6 g/dL (ref 3.5–5.0)
ALT: 23 U/L (ref 0–55)
ANION GAP: 7 meq/L (ref 3–11)
AST: 30 U/L (ref 5–34)
Alkaline Phosphatase: 79 U/L (ref 40–150)
BILIRUBIN TOTAL: 0.61 mg/dL (ref 0.20–1.20)
BUN: 13.6 mg/dL (ref 7.0–26.0)
CO2: 26 meq/L (ref 22–29)
Calcium: 10.5 mg/dL — ABNORMAL HIGH (ref 8.4–10.4)
Chloride: 108 mEq/L (ref 98–109)
Creatinine: 1.3 mg/dL (ref 0.7–1.3)
EGFR: 67 mL/min/{1.73_m2} — AB (ref >90)
Glucose: 103 mg/dl (ref 70–140)
Potassium: 4.2 mEq/L (ref 3.5–5.1)
Sodium: 141 mEq/L (ref 136–145)
TOTAL PROTEIN: 7.2 g/dL (ref 6.4–8.3)

## 2016-08-11 MED ORDER — INFLUENZA VAC SPLIT QUAD 0.5 ML IM SUSY
0.5000 mL | PREFILLED_SYRINGE | Freq: Once | INTRAMUSCULAR | Status: AC
  Administered 2016-08-11: 0.5 mL via INTRAMUSCULAR
  Filled 2016-08-11: qty 0.5

## 2016-08-11 MED ORDER — AZITHROMYCIN 250 MG PO TABS: ORAL_TABLET | ORAL | 0 refills | Status: DC

## 2016-08-11 NOTE — Telephone Encounter (Signed)
Appointments scheduled per 08/11/16 los. A copy of the AVS report and appointment schedule was given to the patient, per 08/11/16 los.

## 2016-08-11 NOTE — Progress Notes (Signed)
[place] [phone]   Fax:(336) [phone]  OFFICE PROGRESS NOTE  Oscar Inches, MD [address] [place] [address]  DIAGNOSIS: stage IV (T2a, N0, M1b) non-small cell lung cancer consistent with poorly differentiated adenocarcinoma with positive EGFR mutation with deletion in exon 19, diagnosed in July of 2015 and presented with right upper lobe lung mass in addition to extensive liver, brain and bone metastases.  He had disease progression and development of EGFR T790M resistant mutation in April 2017. Primary site: Lung (Right)  Staging method: AJCC 7th Edition  Clinical: Stage IV (T2a, N0, M1b) signed by [name], MD on 03/26/2014 4:57 PM  Summary: Stage IV (T2a, N0, M1b)   PRIOR THERAPY:  1) Status post whole brain irradiation under the care Dr. Tammi Klippel completed 04/12/2014. 2) Gilotrif 40 mg po daily - therapy beginning 04/03/2014. Status post approximately 20 months of therapy discontinued secondary to disease progression and development of EGFR T790M resistant mutation.   CURRENT THERAPY:  1) Tagrisso 80 mg by mouth daily. First dose started 01/15/2016. Status post 6 months of treatment. 2) Xgeva 120 mcg subcutaneously on monthly basis.   DISEASE STAGE:  Lung cancer  Primary site: Lung (Right)  Staging method: AJCC 7th Edition  Clinical: Stage IV (T2a, N0, M1b) signed by [name], MD on 03/26/2014 4:57 PM  Summary: Stage IV (T2a, N0, M1b)  CHEMOTHERAPY INTENT: palliative  CURRENT # OF CHEMOTHERAPY CYCLES: 7 CURRENT ANTIEMETICS: none  CURRENT SMOKING STATUS: non-smoker/never smoker  ORAL CHEMOTHERAPY AND CONSENT: yes- Gilotrif, Tagrisso  CURRENT BISPHOSPHONATES USE: None  PAIN MANAGEMENT: none  NARCOTICS INDUCED CONSTIPATION: None  LIVING WILL AND CODE STATUS:   INTERVAL HISTORY: Oscar Floyd 50 y.o. male returns to the clinic today for followup visit. The patient is feeling fine  today with no specific complaints except for nasal congestion and brownish nasal discharge. He is currently on Tagrisso 80 mg by mouth daily status post 6 months of treatment and tolerating it fairly well. He has no significant adverse effects. He denied having any rash or diarrhea. He denied having any significant chest pain, shortness of breath, cough or hemoptysis. He denied having any weight loss or night sweats. He has no nausea or vomiting, no fever or chills. He is here today for evaluation and repeat blood work.  MEDICAL HISTORY: Past Medical History:  Diagnosis Date  . Bone metastases ([place]) 08/26/2015  . Encounter for antineoplastic chemotherapy 01/06/2016  . Family history of cancer   . Hypertension   . Lung cancer ([place])    RUL lung with mets to liver, bone and brain    ALLERGIES:  has No Known Allergies.  MEDICATIONS:  Current Outpatient Prescriptions  Medication Sig Dispense Refill  . esomeprazole (NEXIUM) 20 MG capsule Take by mouth 2 (two) times daily before a meal.     . osimertinib mesylate (TAGRISSO) 80 MG tablet Take 1 tablet (80 mg total) by mouth daily. 30 tablet 0  . ALPRAZolam (XANAX) 1 MG tablet Reported on 02/11/2016    . calcium-vitamin D (OSCAL) 250-125 MG-UNIT per tablet Take 1 tablet by mouth daily. Reported on 01/12/2016    . diazepam (VALIUM) 10 MG tablet Take 1 tablet (10 mg total) by mouth as directed. 30 min before MRI (Patient not taking: Reported on 08/11/2016) 15 tablet 0  . loperamide (IMODIUM) 2 MG capsule Take 2 mg by mouth as needed for diarrhea or loose stools.    Marland Kitchen  mirtazapine (REMERON) 30 MG tablet Take 1 tablet (30 mg total) by mouth at bedtime. (Patient not taking: Reported on 08/11/2016) 30 tablet 2  . naproxen sodium (ANAPROX) 220 MG tablet Take 220 mg by mouth as needed.     No current facility-administered medications for this visit.     SURGICAL HISTORY:  Past Surgical History:  Procedure Laterality Date  . VASECTOMY    . VIDEO  BRONCHOSCOPY Bilateral 03/20/2014   Procedure: VIDEO BRONCHOSCOPY WITH FLUORO;  Surgeon: Rigoberto Noel, MD;  Location: [facility];  Service: Cardiopulmonary;  Laterality: Bilateral;    REVIEW OF SYSTEMS:  A comprehensive review of systems was negative except for: Ears, nose, mouth, throat, and face: positive for nasal congestion   PHYSICAL EXAMINATION: General appearance: alert, cooperative and no distress Head: Normocephalic, without obvious abnormality, atraumatic Neck: no adenopathy, no JVD, supple, symmetrical, trachea midline and thyroid not enlarged, symmetric, no tenderness/mass/nodules Lymph nodes: Cervical, supraclavicular, and axillary nodes normal. Resp: clear to auscultation bilaterally Back: symmetric, no curvature. ROM normal. No CVA tenderness. Cardio: regular rate and rhythm, S1, S2 normal, no murmur, click, rub or gallop GI: soft, non-tender; bowel sounds normal; no masses,  no organomegaly Extremities: extremities normal, atraumatic, no cyanosis or edema Neurologic: Alert and oriented X 3, normal strength and tone. Normal symmetric reflexes. Normal coordination and gait  ECOG PERFORMANCE STATUS: 0 - Asymptomatic  Blood pressure 121/89, pulse 88, temperature 98.1 F (36.7 C), temperature source Oral, resp. rate 18, weight 212 lb 4.8 oz (96.3 kg), SpO2 100 %.  LABORATORY DATA: Lab Results  Component Value Date   WBC 4.3 08/11/2016   HGB 14.5 08/11/2016   HCT 42.7 08/11/2016   MCV 88.4 08/11/2016   PLT 155 08/11/2016      Chemistry      Component Value Date/Time   NA 141 07/07/2016 0903   K 4.2 07/07/2016 0903   CO2 25 07/07/2016 0903   BUN 12.2 07/07/2016 0903   CREATININE 1.2 07/07/2016 0903      Component Value Date/Time   CALCIUM 9.6 07/07/2016 0903   ALKPHOS 69 07/07/2016 0903   AST 29 07/07/2016 0903   ALT 24 07/07/2016 0903   BILITOT 0.69 07/07/2016 0903       RADIOGRAPHIC STUDIES: No results found. ASSESSMENT AND PLAN: This is a very  pleasant 50 years old white male with [name], adenocarcinoma with positive EGFR mutation in exon 19 who was treated with oral Gilotrif 40 mg by mouth daily status post 20 months and tolerating his treatment fairly well except for the grade 1 skin rash and mild paronychia of the toes as well as mild diarrhea once daily. He takes Imodium at regular basis usually in the morning. Unfortunately the recent CT scan of the chest, abdomen and pelvis showed evidence for disease progression with innumerable new and enlarging pulmonary nodules throughout the lungs bilaterally as well as new liver lesions. The plasma test for the EGFR mutation showed EGFR resistant mutation, T790M. He is currently undergoing treatment with Tagrisso 80 mg by mouth daily since 01/15/2016 and tolerating his treatment well.  I recommended for the patient to continue his treatment with Tagrisso with the same dose. For the questionable sinusitis, I will start the patient on Z-Pak. He will also receive flu vaccine today. I will see him back for follow-up visit in one month for reevaluation with repeat blood work.  For the metastatic bone disease, he will continue his treatment with Delton See after  clearance by his dentist from the dental workup. He was advised to call immediately if he has any concerning symptoms in the interval. The patient voices understanding of current disease status and treatment options and is in agreement with the current care plan.  All questions were answered. The patient knows to call the clinic with any problems, questions or concerns. We can certainly see the patient much sooner if necessary.  Disclaimer: This note was dictated with voice recognition software. Similar sounding words can inadvertently be transcribed and may not be corrected upon review.

## 2016-08-19 ENCOUNTER — Other Ambulatory Visit: Payer: Self-pay | Admitting: Medical Oncology

## 2016-08-19 ENCOUNTER — Telehealth: Payer: Self-pay | Admitting: Pharmacist

## 2016-08-19 DIAGNOSIS — C7951 Secondary malignant neoplasm of bone: Secondary | ICD-10-CM

## 2016-08-19 DIAGNOSIS — C3491 Malignant neoplasm of unspecified part of right bronchus or lung: Secondary | ICD-10-CM

## 2016-08-19 DIAGNOSIS — C7931 Secondary malignant neoplasm of brain: Secondary | ICD-10-CM

## 2016-08-19 DIAGNOSIS — Z5111 Encounter for antineoplastic chemotherapy: Secondary | ICD-10-CM

## 2016-08-19 DIAGNOSIS — C7949 Secondary malignant neoplasm of other parts of nervous system: Secondary | ICD-10-CM

## 2016-08-19 MED ORDER — OSIMERTINIB MESYLATE 80 MG PO TABS: 80.0000 mg | ORAL_TABLET | Freq: Every day | ORAL | 0 refills | Status: DC

## 2016-08-19 NOTE — Telephone Encounter (Signed)
Received refill request from [facility] for [place].  Request given to Diane, RN.  Raul Del, PharmD, BCPS, BCOP [facility] [phone]

## 2016-09-16 ENCOUNTER — Telehealth: Payer: Self-pay | Admitting: Internal Medicine

## 2016-09-16 ENCOUNTER — Encounter: Payer: Self-pay | Admitting: Internal Medicine

## 2016-09-16 ENCOUNTER — Ambulatory Visit (HOSPITAL_BASED_OUTPATIENT_CLINIC_OR_DEPARTMENT_OTHER): Payer: BLUE CROSS/BLUE SHIELD | Admitting: Internal Medicine

## 2016-09-16 ENCOUNTER — Other Ambulatory Visit (HOSPITAL_BASED_OUTPATIENT_CLINIC_OR_DEPARTMENT_OTHER): Payer: BLUE CROSS/BLUE SHIELD

## 2016-09-16 VITALS — BP 120/76 | HR 72 | Temp 98.5°F | Resp 20 | Ht 74.0 in | Wt 214.2 lb

## 2016-09-16 DIAGNOSIS — C7931 Secondary malignant neoplasm of brain: Secondary | ICD-10-CM

## 2016-09-16 DIAGNOSIS — R05 Cough: Secondary | ICD-10-CM

## 2016-09-16 DIAGNOSIS — C787 Secondary malignant neoplasm of liver and intrahepatic bile duct: Secondary | ICD-10-CM

## 2016-09-16 DIAGNOSIS — C3411 Malignant neoplasm of upper lobe, right bronchus or lung: Secondary | ICD-10-CM | POA: Diagnosis not present

## 2016-09-16 DIAGNOSIS — C3491 Malignant neoplasm of unspecified part of right bronchus or lung: Secondary | ICD-10-CM

## 2016-09-16 DIAGNOSIS — C7951 Secondary malignant neoplasm of bone: Secondary | ICD-10-CM

## 2016-09-16 DIAGNOSIS — D701 Agranulocytosis secondary to cancer chemotherapy: Secondary | ICD-10-CM

## 2016-09-16 DIAGNOSIS — R059 Cough, unspecified: Secondary | ICD-10-CM

## 2016-09-16 DIAGNOSIS — C7949 Secondary malignant neoplasm of other parts of nervous system: Secondary | ICD-10-CM

## 2016-09-16 DIAGNOSIS — Z5111 Encounter for antineoplastic chemotherapy: Secondary | ICD-10-CM

## 2016-09-16 DIAGNOSIS — Z23 Encounter for immunization: Secondary | ICD-10-CM

## 2016-09-16 LAB — CBC WITH DIFFERENTIAL/PLATELET
BASO%: 0.4 % (ref 0.0–2.0)
Basophils Absolute: 0 10*3/uL (ref 0.0–0.1)
EOS ABS: 0.1 10*3/uL (ref 0.0–0.5)
EOS%: 4.2 % (ref 0.0–7.0)
HCT: 41.1 % (ref 38.4–49.9)
HGB: 13.8 g/dL (ref 13.0–17.1)
LYMPH%: 34.3 % (ref 14.0–49.0)
MCH: 29.8 pg (ref 27.2–33.4)
MCHC: 33.6 g/dL (ref 32.0–36.0)
MCV: 88.8 fL (ref 79.3–98.0)
MONO#: 0.2 10*3/uL (ref 0.1–0.9)
MONO%: 6.4 % (ref 0.0–14.0)
NEUT#: 1.6 10*3/uL (ref 1.5–6.5)
NEUT%: 54.7 % (ref 39.0–75.0)
PLATELETS: 134 10*3/uL — AB (ref 140–400)
RBC: 4.63 10*6/uL (ref 4.20–5.82)
RDW: 13.3 % (ref 11.0–14.6)
WBC: 2.8 10*3/uL — ABNORMAL LOW (ref 4.0–10.3)
lymph#: 1 10*3/uL (ref 0.9–3.3)

## 2016-09-16 LAB — COMPREHENSIVE METABOLIC PANEL
ALT: 25 U/L (ref 0–55)
ANION GAP: 10 meq/L (ref 3–11)
AST: 30 U/L (ref 5–34)
Albumin: 3.8 g/dL (ref 3.5–5.0)
Alkaline Phosphatase: 91 U/L (ref 40–150)
BILIRUBIN TOTAL: 0.68 mg/dL (ref 0.20–1.20)
BUN: 13.5 mg/dL (ref 7.0–26.0)
CHLORIDE: 107 meq/L (ref 98–109)
CO2: 24 meq/L (ref 22–29)
Calcium: 10.3 mg/dL (ref 8.4–10.4)
Creatinine: 1.4 mg/dL — ABNORMAL HIGH (ref 0.7–1.3)
EGFR: 61 mL/min/{1.73_m2} — AB (ref >90)
Glucose: 120 mg/dl (ref 70–140)
Potassium: 3.9 mEq/L (ref 3.5–5.1)
Sodium: 141 mEq/L (ref 136–145)
TOTAL PROTEIN: 6.6 g/dL (ref 6.4–8.3)

## 2016-09-16 NOTE — Telephone Encounter (Signed)
Patient refused 2 bottles of contrast, stated that he will have water based contrast on day of CT appointment. Appointments scheduled per 09/16/16 los. Patient was given a copy of the AVS report and appointment schedule, per 09/16/16.

## 2016-09-16 NOTE — Progress Notes (Signed)
[place] Telephone:([phone]) [phone]   Fax:(336) [phone]  OFFICE PROGRESS NOTE  [name], MD [address] [place] [address]  DIAGNOSIS: stage IV (T2a, N0, M1b) non-small cell lung cancer consistent with poorly differentiated adenocarcinoma with positive EGFR mutation with deletion in exon 19, diagnosed in July of 2015 and presented with right upper lobe lung mass in addition to extensive liver, brain and bone metastases.  He had disease progression and development of EGFR T790M resistant mutation in April 2017. Primary site: Lung (Right)  Staging method: AJCC 7th Edition  Clinical: Stage IV (T2a, N0, M1b) signed by [name], MD on 03/26/2014 4:57 PM  Summary: Stage IV (T2a, N0, M1b)   PRIOR THERAPY:  1) Status post whole brain irradiation under the care Dr. Tammi Klippel completed 04/12/2014. 2) Gilotrif 40 mg po daily - therapy beginning 04/03/2014. Status post approximately 20 months of therapy discontinued secondary to disease progression and development of EGFR T790M resistant mutation.   CURRENT THERAPY:  1) Tagrisso 80 mg by mouth daily. First dose started 01/15/2016. Status post 7 months of treatment. 2) Xgeva 120 mcg subcutaneously on monthly basis.   INTERVAL HISTORY: Oscar Floyd 51 y.o. male came to the clinic today for follow-up visit. The patient is feeling fine today was no specific complaints. His sinus infection has significantly improved after treatment with antibiotics. He denied having any current fever or chills. He has no nausea or vomiting. He denied having any significant chest pain, shortness of breath, cough or hemoptysis. He didn't 2 pounds during the holiday season. He is here today for evaluation and repeat blood work for monitoring of his treatment.  MEDICAL HISTORY: Past Medical History:  Diagnosis Date  . Bone metastases ([place]) 08/26/2015  . Encounter for antineoplastic chemotherapy  01/06/2016  . Family history of cancer   . Hypertension   . Lung cancer ([place])    RUL lung with mets to liver, bone and brain    ALLERGIES:  has No Known Allergies.  MEDICATIONS:  Current Outpatient Prescriptions  Medication Sig Dispense Refill  . ALPRAZolam (XANAX) 1 MG tablet Reported on 02/11/2016    . calcium-vitamin D (OSCAL) 250-125 MG-UNIT per tablet Take 1 tablet by mouth daily. Reported on 01/12/2016    . diazepam (VALIUM) 10 MG tablet Take 1 tablet (10 mg total) by mouth as directed. 30 min before MRI (Patient not taking: Reported on 08/11/2016) 15 tablet 0  . esomeprazole (NEXIUM) 20 MG capsule Take by mouth 2 (two) times daily before a meal.     . loperamide (IMODIUM) 2 MG capsule Take 2 mg by mouth as needed for diarrhea or loose stools.    . mirtazapine (REMERON) 30 MG tablet Take 1 tablet (30 mg total) by mouth at bedtime. (Patient not taking: Reported on 08/11/2016) 30 tablet 2  . naproxen sodium (ANAPROX) 220 MG tablet Take 220 mg by mouth as needed.    Marland Kitchen osimertinib mesylate (TAGRISSO) 80 MG tablet Take 1 tablet (80 mg total) by mouth daily. 30 tablet 0   No current facility-administered medications for this visit.     SURGICAL HISTORY:  Past Surgical History:  Procedure Laterality Date  . VASECTOMY    . VIDEO BRONCHOSCOPY Bilateral 03/20/2014   Procedure: VIDEO BRONCHOSCOPY WITH FLUORO;  Surgeon: Rigoberto Noel, MD;  Location: [facility];  Service: Cardiopulmonary;  Laterality: Bilateral;    REVIEW OF SYSTEMS:  A comprehensive review of systems  was negative.   PHYSICAL EXAMINATION: General appearance: alert, cooperative and no distress Head: Normocephalic, without obvious abnormality, atraumatic Neck: no adenopathy, no JVD, supple, symmetrical, trachea midline and thyroid not enlarged, symmetric, no tenderness/mass/nodules Lymph nodes: Cervical, supraclavicular, and axillary nodes normal. Resp: clear to auscultation bilaterally Back: symmetric, no curvature. ROM  normal. No CVA tenderness. Cardio: regular rate and rhythm, S1, S2 normal, no murmur, click, rub or gallop GI: soft, non-tender; bowel sounds normal; no masses,  no organomegaly Extremities: extremities normal, atraumatic, no cyanosis or edema  ECOG PERFORMANCE STATUS: 0 - Asymptomatic  Blood pressure 120/76, pulse 72, temperature 98.5 F (36.9 C), temperature source Oral, resp. rate 20, height 6' 2"  (1.88 m), weight 214 lb 3.2 oz (97.2 kg), SpO2 100 %.  LABORATORY DATA: Lab Results  Component Value Date   WBC 2.8 (L) 09/16/2016   HGB 13.8 09/16/2016   HCT 41.1 09/16/2016   MCV 88.8 09/16/2016   PLT 134 (L) 09/16/2016      Chemistry      Component Value Date/Time   NA 141 08/11/2016 0841   K 4.2 08/11/2016 0841   CO2 26 08/11/2016 0841   BUN 13.6 08/11/2016 0841   CREATININE 1.3 08/11/2016 0841      Component Value Date/Time   CALCIUM 10.5 (H) 08/11/2016 0841   ALKPHOS 79 08/11/2016 0841   AST 30 08/11/2016 0841   ALT 23 08/11/2016 0841   BILITOT 0.61 08/11/2016 0841       RADIOGRAPHIC STUDIES: No results found. ASSESSMENT AND PLAN:  This is a very pleasant 51 years old white male with a [name], adenocarcinoma with positive EGFR mutation in exon 19 status post 20 months treatment with Gilotrif. The patient developed EGFR resistant mutation T790M. is currently on treatment with Tagrisso 80 mg by mouth daily since May 2017. The patient is tolerating his treatment well with no significant adverse effects. I recommended for him to continue his treatment with Tagrisso with the same dose.  He would come back for follow-up visit in one month's for evaluation after repeating CT scan of the chest, abdomen and pelvis for restaging of his disease. The patient has mild leukocytopenia today and discovered be drug-induced. We will continue to monitor closely on the upcoming bloodwork. For the metastatic bone disease, he will continue treatment with  Xgeva. He was advised to call immediately if he has any concerning symptoms in the interval. The patient voices understanding of current disease status and treatment options and is in agreement with the current care plan.  All questions were answered. The patient knows to call the clinic with any problems, questions or concerns. We can certainly see the patient much sooner if necessary. I spent 10 minutes counseling the patient face to face. The total time spent in the appointment was 15 minutes. Disclaimer: This note was dictated with voice recognition software. Similar sounding words can inadvertently be transcribed and may not be corrected upon review.

## 2016-09-21 ENCOUNTER — Other Ambulatory Visit: Payer: Self-pay | Admitting: Medical Oncology

## 2016-09-21 ENCOUNTER — Telehealth: Payer: Self-pay | Admitting: *Deleted

## 2016-09-21 DIAGNOSIS — C3491 Malignant neoplasm of unspecified part of right bronchus or lung: Secondary | ICD-10-CM

## 2016-09-21 DIAGNOSIS — C7951 Secondary malignant neoplasm of bone: Secondary | ICD-10-CM

## 2016-09-21 DIAGNOSIS — Z5111 Encounter for antineoplastic chemotherapy: Secondary | ICD-10-CM

## 2016-09-21 DIAGNOSIS — C7949 Secondary malignant neoplasm of other parts of nervous system: Secondary | ICD-10-CM

## 2016-09-21 DIAGNOSIS — C7931 Secondary malignant neoplasm of brain: Secondary | ICD-10-CM

## 2016-09-21 MED ORDER — OSIMERTINIB MESYLATE 80 MG PO TABS: 80.0000 mg | ORAL_TABLET | Freq: Every day | ORAL | 0 refills | Status: DC

## 2016-09-21 NOTE — Telephone Encounter (Signed)
Schedule request sent to move labs and extend date for scan to 2/5.

## 2016-09-21 NOTE — Telephone Encounter (Signed)
Call from pt reporting runny nose, asks if he should be seen in office or by PCP. Pt denies fever or dyspnea. Informed him this will likely resolve without intervention. If symptoms worsen see PCP. He voiced understanding.  Pt also inquiring about scans- noted Josem Kaufmann is incomplete. Message to managed care for prior auth.  Pt requests lab and scan on 2/5 if possible.

## 2016-09-23 ENCOUNTER — Telehealth: Payer: Self-pay | Admitting: Internal Medicine

## 2016-09-23 NOTE — Telephone Encounter (Signed)
Appointment rescheduled per scheduling message. New schedule mailed out to patient.

## 2016-10-04 ENCOUNTER — Other Ambulatory Visit: Payer: Self-pay | Admitting: Radiation Therapy

## 2016-10-04 DIAGNOSIS — C7931 Secondary malignant neoplasm of brain: Secondary | ICD-10-CM

## 2016-10-04 DIAGNOSIS — C7949 Secondary malignant neoplasm of other parts of nervous system: Principal | ICD-10-CM

## 2016-10-14 ENCOUNTER — Other Ambulatory Visit: Payer: Self-pay | Admitting: Radiation Oncology

## 2016-10-14 ENCOUNTER — Ambulatory Visit
Admission: RE | Admit: 2016-10-14 | Discharge: 2016-10-14 | Disposition: A | Discharge status: Home / Self Care | Payer: BLUE CROSS/BLUE SHIELD | Source: Ambulatory Visit | Attending: Radiation Oncology | Admitting: Radiation Oncology

## 2016-10-14 DIAGNOSIS — C7949 Secondary malignant neoplasm of other parts of nervous system: Principal | ICD-10-CM

## 2016-10-14 DIAGNOSIS — C7931 Secondary malignant neoplasm of brain: Secondary | ICD-10-CM

## 2016-10-14 MED ORDER — GADOBENATE DIMEGLUMINE 529 MG/ML IV SOLN
20.0000 mL | Freq: Once | INTRAVENOUS | Status: AC | PRN
  Administered 2016-10-14: 20 mL via INTRAVENOUS

## 2016-10-15 ENCOUNTER — Other Ambulatory Visit: Payer: BLUE CROSS/BLUE SHIELD

## 2016-10-18 ENCOUNTER — Ambulatory Visit
Admission: RE | Admit: 2016-10-18 | Discharge: 2016-10-18 | Disposition: A | Discharge status: Home / Self Care | Payer: BLUE CROSS/BLUE SHIELD | Source: Ambulatory Visit | Attending: Radiation Oncology | Admitting: Radiation Oncology

## 2016-10-18 ENCOUNTER — Ambulatory Visit (HOSPITAL_COMMUNITY)
Admission: RE | Admit: 2016-10-18 | Discharge: 2016-10-18 | Disposition: A | Discharge status: Home / Self Care | Payer: BLUE CROSS/BLUE SHIELD | Source: Ambulatory Visit | Attending: Internal Medicine | Admitting: Internal Medicine

## 2016-10-18 ENCOUNTER — Other Ambulatory Visit (HOSPITAL_BASED_OUTPATIENT_CLINIC_OR_DEPARTMENT_OTHER): Payer: BLUE CROSS/BLUE SHIELD

## 2016-10-18 ENCOUNTER — Ambulatory Visit (HOSPITAL_COMMUNITY): Payer: BLUE CROSS/BLUE SHIELD

## 2016-10-18 VITALS — BP 133/84 | HR 89 | Resp 18 | Wt 218.8 lb

## 2016-10-18 DIAGNOSIS — C3411 Malignant neoplasm of upper lobe, right bronchus or lung: Secondary | ICD-10-CM | POA: Diagnosis not present

## 2016-10-18 DIAGNOSIS — Z9221 Personal history of antineoplastic chemotherapy: Secondary | ICD-10-CM | POA: Insufficient documentation

## 2016-10-18 DIAGNOSIS — C7931 Secondary malignant neoplasm of brain: Secondary | ICD-10-CM | POA: Insufficient documentation

## 2016-10-18 DIAGNOSIS — C7951 Secondary malignant neoplasm of bone: Secondary | ICD-10-CM

## 2016-10-18 DIAGNOSIS — Z808 Family history of malignant neoplasm of other organs or systems: Secondary | ICD-10-CM | POA: Diagnosis not present

## 2016-10-18 DIAGNOSIS — Z5189 Encounter for other specified aftercare: Secondary | ICD-10-CM | POA: Insufficient documentation

## 2016-10-18 DIAGNOSIS — C7949 Secondary malignant neoplasm of other parts of nervous system: Secondary | ICD-10-CM | POA: Diagnosis present

## 2016-10-18 DIAGNOSIS — Z801 Family history of malignant neoplasm of trachea, bronchus and lung: Secondary | ICD-10-CM | POA: Diagnosis not present

## 2016-10-18 DIAGNOSIS — Z9889 Other specified postprocedural states: Secondary | ICD-10-CM | POA: Diagnosis not present

## 2016-10-18 DIAGNOSIS — Z85841 Personal history of malignant neoplasm of brain: Secondary | ICD-10-CM | POA: Insufficient documentation

## 2016-10-18 DIAGNOSIS — C3491 Malignant neoplasm of unspecified part of right bronchus or lung: Secondary | ICD-10-CM | POA: Diagnosis not present

## 2016-10-18 DIAGNOSIS — Z79899 Other long term (current) drug therapy: Secondary | ICD-10-CM | POA: Diagnosis not present

## 2016-10-18 LAB — COMPREHENSIVE METABOLIC PANEL
ALBUMIN: 4 g/dL (ref 3.5–5.0)
ALK PHOS: 103 U/L (ref 40–150)
ALT: 26 U/L (ref 0–55)
AST: 33 U/L (ref 5–34)
Anion Gap: 9 mEq/L (ref 3–11)
BILIRUBIN TOTAL: 0.63 mg/dL (ref 0.20–1.20)
BUN: 16.2 mg/dL (ref 7.0–26.0)
CALCIUM: 10.9 mg/dL — AB (ref 8.4–10.4)
CO2: 28 mEq/L (ref 22–29)
CREATININE: 1.4 mg/dL — AB (ref 0.7–1.3)
Chloride: 104 mEq/L (ref 98–109)
EGFR: 57 mL/min/{1.73_m2} — ABNORMAL LOW (ref >90)
Glucose: 89 mg/dl (ref 70–140)
POTASSIUM: 4.3 meq/L (ref 3.5–5.1)
Sodium: 141 mEq/L (ref 136–145)
TOTAL PROTEIN: 7.1 g/dL (ref 6.4–8.3)

## 2016-10-18 LAB — CBC WITH DIFFERENTIAL/PLATELET
BASO%: 0.7 % (ref 0.0–2.0)
Basophils Absolute: 0 10*3/uL (ref 0.0–0.1)
EOS ABS: 0.1 10*3/uL (ref 0.0–0.5)
EOS%: 2.9 % (ref 0.0–7.0)
HEMATOCRIT: 41.4 % (ref 38.4–49.9)
HEMOGLOBIN: 13.9 g/dL (ref 13.0–17.1)
LYMPH#: 1.3 10*3/uL (ref 0.9–3.3)
LYMPH%: 34.4 % (ref 14.0–49.0)
MCH: 30 pg (ref 27.2–33.4)
MCHC: 33.6 g/dL (ref 32.0–36.0)
MCV: 89.3 fL (ref 79.3–98.0)
MONO#: 0.4 10*3/uL (ref 0.1–0.9)
MONO%: 10 % (ref 0.0–14.0)
NEUT%: 52 % (ref 39.0–75.0)
NEUTROS ABS: 2 10*3/uL (ref 1.5–6.5)
Platelets: 124 10*3/uL — ABNORMAL LOW (ref 140–400)
RBC: 4.64 10*6/uL (ref 4.20–5.82)
RDW: 14.1 % (ref 11.0–14.6)
WBC: 3.8 10*3/uL — ABNORMAL LOW (ref 4.0–10.3)

## 2016-10-18 MED ORDER — IOPAMIDOL (ISOVUE-300) INJECTION 61%
INTRAVENOUS | Status: AC
  Administered 2016-10-18: 100 mL
  Filled 2016-10-18: qty 100

## 2016-10-18 MED ORDER — SODIUM CHLORIDE 0.9 % IJ SOLN
INTRAMUSCULAR | Status: AC
  Filled 2016-10-18: qty 50

## 2016-10-18 MED ORDER — IOPAMIDOL (ISOVUE-300) INJECTION 61%
INTRAVENOUS | Status: AC
  Administered 2016-10-18: 30 mL via ORAL
  Filled 2016-10-18: qty 30

## 2016-10-18 NOTE — Progress Notes (Addendum)
Vitals stable. Denies pain. Continues Tagrisso as directed. Denies headache, dizziness, nausea, vomiting, diplopia or tinnitus. Reports approximately one month ago for a period of a month and a half patient experienced lightheadness to the point he felt like he was going to pass out when he rolled over in bed. Also, around this time patient had a sinus infection. Reports infection was treated with antibiotics and resolved relatively quickly. Patient with history of diarrhea while taking other oral chemotherapy agents. Patient denies diarrhea while taking Tagrisso thus, weight gain noted. Scheduled for labs and CT of chest today per Mohomad's order.   BP 133/84 (BP Location: Left Arm, Patient Position: Sitting, Cuff Size: Normal)   Pulse 89   Resp 18   Wt 218 lb 12.8 oz (99.2 kg)   SpO2 100%   BMI 28.09 kg/m  Wt Readings from Last 3 Encounters:  10/18/16 218 lb 12.8 oz (99.2 kg)  09/16/16 214 lb 3.2 oz (97.2 kg)  08/11/16 212 lb 4.8 oz (96.3 kg)

## 2016-10-18 NOTE — Progress Notes (Signed)
Radiation Oncology         [phone]   Name: Oscar Floyd   Date: 10/18/2016   MRN: [id number]  DOB: 1966-02-07    Multidisciplinary Brain and Spine Oncology Clinic Follow-Up Visit Note  CC: [name], MD  [name], MD  No diagnosis found.  Diagnosis:  51 y.o. gentleman with numerous subcentimeter brain metastases from EGFR positive adenocarcinoma the lung  Interval Since Last Radiation:  2 years and 7 months  04/01/2014-04/12/2014: The whole brain was treated to 30 Gy in 10 fractions of 3 Gy  Narrative:  The patient returns today for routine follow-up. In summary this is a pleasant 51 y.o. 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 He received radiotherapy to the brain in 2015 and has been radiographically without disease since that time. He continues with Dr. Julien Nordmann and is currently undergoing treatment with Tagrisso 80 mg by mouth daily. He proceeded with an MRI of the brain on 10/14/16 which did not reveal concerns for disease. He comes to review these results. Patient notes he continues to take Tagrisso as directed. Patient is scheduled for labs and CT of the chest today per Dr. Worthy Flank order.  On review of systems, the patient reports that he is doing well overall. About 1 month ago, he did have an episode of rhinorrhea, and sinus congestion. He was treated with antibiotics, and his symptoms improved. During this time he also had episode of what he recalls as being similar to presyncope. He never had any episodes of syncope however. He denies any difficulty with headaches, visual or auditory disturbances, and reports stable changes and short-term memory since radiation. He denies any chest pain, shortness of breath, cough, fevers, chills, night sweats, unintended weight changes. He denies any bowel or bladder disturbances, and denies abdominal pain, nausea or vomiting. He denies any  new musculoskeletal or joint aches or pains, new skin lesions or concerns. A complete review of systems is obtained and is otherwise negative.    Past Medical History:  Past Medical History:  Diagnosis Date  . Bone metastases ([place]) 08/26/2015  . Encounter for antineoplastic chemotherapy 01/06/2016  . Family history of cancer   . Hypertension   . Lung cancer ([place])    RUL lung with mets to liver, bone and brain    Past Surgical History: Past Surgical History:  Procedure Laterality Date  . VASECTOMY    . VIDEO BRONCHOSCOPY Bilateral 03/20/2014   Procedure: VIDEO BRONCHOSCOPY WITH FLUORO;  Surgeon: Rigoberto Noel, MD;  Location: [facility];  Service: Cardiopulmonary;  Laterality: Bilateral;    Social History:  Social History   Social History  . Marital status: Married    Spouse name: N/A  . Number of children: 2  . Years of education: N/A   Occupational History  . Not on file.   Social History Main Topics  . Smoking status: Never Smoker  . Smokeless tobacco: Never Used  . Alcohol use Yes     Comment: socially  . Drug use: No  . Sexual activity: Yes   Other Topics Concern  . Not on file   Social History Narrative  . No narrative on file  The patient is married and has 2 sons, he lives in [place], [place]. He works for [employer].  Family History: Family History  Problem Relation Age of Onset  . Cancer Father 79    liposarcoma age 50  .  Cancer Mother 62    gist  . Cancer Paternal Grandfather     lung cancer; heavy smoker; also had abdominal cancer in 88's  . Stroke Maternal Grandmother   . Heart disease Maternal Grandfather   . Cancer Other     multiple brothers of PGF with unknown cancer    ALLERGIES:  has No Known Allergies.  Meds: Current Outpatient Prescriptions  Medication Sig Dispense Refill  . esomeprazole (NEXIUM) 20 MG capsule Take by mouth 2 (two) times daily before a meal.     . mirtazapine (REMERON) 30 MG tablet Take 1 tablet (30 mg  total) by mouth at bedtime. 30 tablet 2  . naproxen sodium (ANAPROX) 220 MG tablet Take 220 mg by mouth as needed.    Marland Kitchen osimertinib mesylate (TAGRISSO) 80 MG tablet Take 1 tablet (80 mg total) by mouth daily. 30 tablet 0  . ALPRAZolam (XANAX) 1 MG tablet Reported on 02/11/2016    . calcium-vitamin D (OSCAL) 250-125 MG-UNIT per tablet Take 1 tablet by mouth daily. Reported on 01/12/2016    . diazepam (VALIUM) 10 MG tablet Take 1 tablet (10 mg total) by mouth as directed. 30 min before MRI (Patient not taking: Reported on 09/16/2016) 15 tablet 0  . loperamide (IMODIUM) 2 MG capsule Take 2 mg by mouth as needed for diarrhea or loose stools.     No current facility-administered medications for this encounter.     Physical Findings:  weight is 218 lb 12.8 oz (99.2 kg). His blood pressure is 133/84 and his pulse is 89. His respiration is 18 and oxygen saturation is 100%.   In general this is a well appearing Caucasian male in no acute distress. He's alert and oriented x4 and appropriate throughout the examination. Cardiopulmonary assessment is negative for acute distress and he exhibits normal effort. He appears to be grossly intact from the neurologic perspective.  Lab Findings: Lab Results  Component Value Date   WBC 2.8 (L) 09/16/2016   HGB 13.8 09/16/2016   HCT 41.1 09/16/2016   MCV 88.8 09/16/2016   PLT 134 (L) 09/16/2016    Radiographic Findings: Mr [name] XK Contrast  Result Date: 10/14/2016 CLINICAL DATA:  Lung cancer with treated brain metastases. Follow-up. EXAM: MRI HEAD WITHOUT AND WITH CONTRAST TECHNIQUE: Multiplanar, multiecho pulse sequences of the brain and surrounding structures were obtained without and with intravenous contrast. CONTRAST:  35m MULTIHANCE GADOBENATE DIMEGLUMINE 529 MG/ML IV SOLN COMPARISON:  05/20/2016 FINDINGS: Brain: No enhancement or swelling to suggest recurrent metastatic disease. Patient had multiple brain metastases at time of whole-brain radiotherapy. The  large majority of these are no longer apparent. A few T2 hyperintensities persist including in the right thalamus and left central cerebellum. No signs of leukoencephalopathy or atrophy. No acute or interval infarct, hemorrhage, or hydrocephalus. Vascular: Preserved flow voids Skull and upper cervical spine: C2 body, bilateral occipital condyle, and clivus metastases. No evidence of progression. Sinuses/Orbits: Negative IMPRESSION: 1. No evidence of new/recurrent disease. 2. History of whole-brain radiotherapy. No atrophy or signs of leukoencephalopathy. 3. Stable visualized osseous metastases. Electronically Signed   By: JMonte FantasiaM.D.   On: 10/14/2016 13:55   Impression/Plan: 1.  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 The patient has no evidence of progressive or recurrent brain metastases at this time on recent MRI.He was given a copy of this, and will proceed with CT today for Dr. MJulien Nordmann We will continue serial imaging with MRI in  4 months and a follow up afterwards. He will continue to follow up with Dr. Julien Nordmann regarding systemic treatment.Tagrisso.     Carola Rhine, PAC

## 2016-10-19 ENCOUNTER — Ambulatory Visit (HOSPITAL_BASED_OUTPATIENT_CLINIC_OR_DEPARTMENT_OTHER): Payer: BLUE CROSS/BLUE SHIELD | Admitting: Internal Medicine

## 2016-10-19 ENCOUNTER — Encounter: Payer: Self-pay | Admitting: Internal Medicine

## 2016-10-19 ENCOUNTER — Telehealth: Payer: Self-pay | Admitting: Internal Medicine

## 2016-10-19 VITALS — BP 128/76 | HR 85 | Temp 98.0°F | Resp 18 | Ht 74.0 in | Wt 218.7 lb

## 2016-10-19 DIAGNOSIS — N289 Disorder of kidney and ureter, unspecified: Secondary | ICD-10-CM

## 2016-10-19 DIAGNOSIS — Z5111 Encounter for antineoplastic chemotherapy: Secondary | ICD-10-CM

## 2016-10-19 DIAGNOSIS — F419 Anxiety disorder, unspecified: Secondary | ICD-10-CM

## 2016-10-19 DIAGNOSIS — C7931 Secondary malignant neoplasm of brain: Secondary | ICD-10-CM

## 2016-10-19 DIAGNOSIS — C3411 Malignant neoplasm of upper lobe, right bronchus or lung: Secondary | ICD-10-CM

## 2016-10-19 DIAGNOSIS — C7951 Secondary malignant neoplasm of bone: Secondary | ICD-10-CM

## 2016-10-19 DIAGNOSIS — D701 Agranulocytosis secondary to cancer chemotherapy: Secondary | ICD-10-CM

## 2016-10-19 DIAGNOSIS — F329 Major depressive disorder, single episode, unspecified: Secondary | ICD-10-CM

## 2016-10-19 DIAGNOSIS — C7949 Secondary malignant neoplasm of other parts of nervous system: Secondary | ICD-10-CM

## 2016-10-19 DIAGNOSIS — D6959 Other secondary thrombocytopenia: Secondary | ICD-10-CM

## 2016-10-19 DIAGNOSIS — C787 Secondary malignant neoplasm of liver and intrahepatic bile duct: Secondary | ICD-10-CM

## 2016-10-19 DIAGNOSIS — C3491 Malignant neoplasm of unspecified part of right bronchus or lung: Secondary | ICD-10-CM

## 2016-10-19 NOTE — Progress Notes (Signed)
[place] [phone]   [phone]  OFFICE PROGRESS NOTE  [name], MD [address] [place] [address]  DIAGNOSIS: stage IV (T2a, N0, M1b) non-small cell lung cancer consistent with poorly differentiated adenocarcinoma with positive EGFR mutation with deletion in exon 19, diagnosed in July of 2015 and presented with right upper lobe lung mass in addition to extensive liver, brain and bone metastases.  He had disease progression and development of EGFR T790M resistant mutation in April 2017.  PRIOR THERAPY: 1) Status post whole brain irradiation under the care Dr. Tammi Klippel completed 04/12/2014. 2) Gilotrif 40 mg po daily - therapy beginning 04/03/2014. Status post approximately 20 months of therapy discontinued secondary to disease progression and development of EGFR T790M resistant mutation.   CURRENT THERAPY: 1) Tagrisso 80 mg by mouth daily status post 8 months of treatment. First dose was given on 01/12/2016. 2) Xgeva 120 g subcutaneously on monthly basis.  INTERVAL HISTORY: Oscar Floyd 51 y.o. male returns to the clinic today for Compazine to the clinic today for follow-up visit. The patient is currently on treatment with Tagrisso by mouth status post 8 months of treatment. He is tolerating his treatment well with no significant adverse effects. He denied having any skin rash or diarrhea. He has no chest pain, shortness of breath, cough or hemoptysis. He gained few pounds recently. The patient denied having any significant nausea or vomiting. He has no fever or chills. He had repeat CT scan of the chest, abdomen and pelvis performed recently and he is here for evaluation and discussion of his scan results.  MEDICAL HISTORY: Past Medical History:  Diagnosis Date  . Bone metastases ([place]) 08/26/2015  . Encounter for antineoplastic chemotherapy 01/06/2016  . Family history of cancer   . Hypertension     . Lung cancer ([place])    RUL lung with mets to liver, bone and brain    ALLERGIES:  has No Known Allergies.  MEDICATIONS:  Current Outpatient Prescriptions  Medication Sig Dispense Refill  . ALPRAZolam (XANAX) 1 MG tablet Reported on 02/11/2016    . calcium-vitamin D (OSCAL) 250-125 MG-UNIT per tablet Take 1 tablet by mouth daily. Reported on 01/12/2016    . diazepam (VALIUM) 10 MG tablet Take 1 tablet (10 mg total) by mouth as directed. 30 min before MRI (Patient not taking: Reported on 09/16/2016) 15 tablet 0  . esomeprazole (NEXIUM) 20 MG capsule Take by mouth 2 (two) times daily before a meal.     . loperamide (IMODIUM) 2 MG capsule Take 2 mg by mouth as needed for diarrhea or loose stools.    . mirtazapine (REMERON) 30 MG tablet Take 1 tablet (30 mg total) by mouth at bedtime. 30 tablet 2  . naproxen sodium (ANAPROX) 220 MG tablet Take 220 mg by mouth as needed.    Marland Kitchen osimertinib mesylate (TAGRISSO) 80 MG tablet Take 1 tablet (80 mg total) by mouth daily. 30 tablet 0   No current facility-administered medications for this visit.     SURGICAL HISTORY:  Past Surgical History:  Procedure Laterality Date  . VASECTOMY    . VIDEO BRONCHOSCOPY Bilateral 03/20/2014   Procedure: VIDEO BRONCHOSCOPY WITH FLUORO;  Surgeon: Rigoberto Noel, MD;  Location: [facility];  Service: Cardiopulmonary;  Laterality: Bilateral;    REVIEW OF SYSTEMS:  Constitutional: negative Eyes: negative Ears, nose, mouth, throat, and face: negative Respiratory: negative Cardiovascular: negative Gastrointestinal: negative Genitourinary:negative  Integument/breast: negative Hematologic/lymphatic: negative Musculoskeletal:negative Neurological: negative Behavioral/Psych: negative Endocrine: negative Allergic/Immunologic: negative   PHYSICAL EXAMINATION: General appearance: alert, cooperative, appears stated age and no distress Head: Normocephalic, without obvious abnormality, atraumatic Neck: no adenopathy, no JVD,  supple, symmetrical, trachea midline and thyroid not enlarged, symmetric, no tenderness/mass/nodules Lymph nodes: Cervical, supraclavicular, and axillary nodes normal. Resp: clear to auscultation bilaterally Back: symmetric, no curvature. ROM normal. No CVA tenderness. Cardio: regular rate and rhythm, S1, S2 normal, no murmur, click, rub or gallop GI: soft, non-tender; bowel sounds normal; no masses,  no organomegaly Extremities: extremities normal, atraumatic, no cyanosis or edema Neurologic: Alert and oriented X 3, normal strength and tone. Normal symmetric reflexes. Normal coordination and gait  ECOG PERFORMANCE STATUS: 0 - Asymptomatic  Blood pressure 128/76, pulse 85, temperature 98 F (36.7 C), temperature source Oral, resp. rate 18, height _0  (1.88 m), weight 218 lb 11.2 oz (99.2 kg), SpO2 98 %.  LABORATORY DATA: Lab Results  Component Value Date   WBC 3.8 (L) 10/18/2016   HGB 13.9 10/18/2016   HCT 41.4 10/18/2016   MCV 89.3 10/18/2016   PLT 124 (L) 10/18/2016      Chemistry      Component Value Date/Time   NA 141 10/18/2016 0900   K 4.3 10/18/2016 0900   CO2 28 10/18/2016 0900   BUN 16.2 10/18/2016 0900   CREATININE 1.4 (H) 10/18/2016 0900      Component Value Date/Time   CALCIUM 10.9 (H) 10/18/2016 0900   ALKPHOS 103 10/18/2016 0900   AST 33 10/18/2016 0900   ALT 26 10/18/2016 0900   BILITOT 0.63 10/18/2016 0900       RADIOGRAPHIC STUDIES: Ct Chest W Contrast  Result Date: 10/18/2016 CLINICAL DATA:  Non-small cell right lung cancer with metastatic disease to the liver and skeleton. Oral chemotherapy ongoing. EXAM: CT CHEST, ABDOMEN, AND PELVIS WITH CONTRAST TECHNIQUE: Multidetector CT imaging of the chest, abdomen and pelvis was performed following the standard protocol during bolus administration of intravenous contrast. CONTRAST:  171m ISOVUE-300 IOPAMIDOL (ISOVUE-300) INJECTION 61% COMPARISON:  07/07/2016 FINDINGS: CT CHEST FINDINGS Cardiovascular:  Unremarkable Mediastinum/Nodes: Stable 6 mm in short axis lymph node adjacent to the gastroesophageal junction, image 46/2. Small type 1 hiatal hernia. Mild wall thickening of portions of the thoracic esophagus, suggesting potential esophagitis. Lungs/Pleura: Mild biapical pleuroparenchymal scarring. Scattered nodularity in the right lung, more concentrated in the right upper lobe, with a 1.7 by 1.5 cm right upper lobe nodule anteriorly on image 70/4, by my measurements previously the same size. Densely calcified 4 mm granuloma in the right lower lobe. Airway thickening is present, suggesting bronchitis or reactive airways disease. On image 38/4 there is a 1.9 by 1.3 cm ground-glass opacity centrally in the left upper lobe which appears to be new compared to prior exams. Musculoskeletal: Extensive scattered osseous metastatic disease, with some evolutionary findings including a progressive amount of lucency centrally in some of the medial right clavicular metastatic disease, but an overall stable distribution and appearance aside from these mild evolutionary findings. No compression fracture observed. No obvious vertebral destruction along the margins of the spinal canal. CT ABDOMEN PELVIS FINDINGS Hepatobiliary: There is some faint heterogeneity in the dome of the right hepatic lobe but no definite mass. Pancreas: Lobularity of the top of the pancreas appear stable and probably incidental on image 57/2. Similar appearance back on 03/25/2014 without any focal hypermetabolic activity. Spleen: Unremarkable Adrenals/Urinary Tract: Unremarkable Stomach/Bowel: Unremarkable Vascular/Lymphatic: Unremarkable Reproductive: Unremarkable Other: Subtle stranding at  the root of the mesentery, not appreciably changed from 2015. Musculoskeletal: Stable pattern of relatively diffuse osseous metastatic disease. IMPRESSION: 1. Generally stable appearance, with a dominant 1.7 by 1.5 cm right upper lobe pulmonary nodule, and diffuse  osseous metastatic disease. There is some evolutionary lucency in a right medial clavicular pre-existing metastatic lesion but otherwise the osseous metastatic disease appears unchanged, and without new osseous lesions. 2. There is a new 1.9 by 1.3 cm ground-glass opacity lesion in the left upper lobe on image 38/4 which is most likely inflammatory but merits observation. 3. Micro nodularity in the lungs, especially the right upper lobe, pop possibly a residua from prior malignancy. 4. Airway thickening is present, suggesting bronchitis or reactive airways disease. Electronically Signed   By: Van Clines M.D.   On: 10/18/2016 14:04   Mr [name] FG Contrast  Result Date: 10/14/2016 CLINICAL DATA:  Lung cancer with treated brain metastases. Follow-up. EXAM: MRI HEAD WITHOUT AND WITH CONTRAST TECHNIQUE: Multiplanar, multiecho pulse sequences of the brain and surrounding structures were obtained without and with intravenous contrast. CONTRAST:  33m MULTIHANCE GADOBENATE DIMEGLUMINE 529 MG/ML IV SOLN COMPARISON:  05/20/2016 FINDINGS: Brain: No enhancement or swelling to suggest recurrent metastatic disease. Patient had multiple brain metastases at time of whole-brain radiotherapy. The large majority of these are no longer apparent. A few T2 hyperintensities persist including in the right thalamus and left central cerebellum. No signs of leukoencephalopathy or atrophy. No acute or interval infarct, hemorrhage, or hydrocephalus. Vascular: Preserved flow voids Skull and upper cervical spine: C2 body, bilateral occipital condyle, and clivus metastases. No evidence of progression. Sinuses/Orbits: Negative IMPRESSION: 1. No evidence of new/recurrent disease. 2. History of whole-brain radiotherapy. No atrophy or signs of leukoencephalopathy. 3. Stable visualized osseous metastases. Electronically Signed   By: JMonte FantasiaM.D.   On: 10/14/2016 13:55   Ct Abdomen Pelvis W Contrast  Result Date:  10/18/2016 CLINICAL DATA:  Non-small cell right lung cancer with metastatic disease to the liver and skeleton. Oral chemotherapy ongoing. EXAM: CT CHEST, ABDOMEN, AND PELVIS WITH CONTRAST TECHNIQUE: Multidetector CT imaging of the chest, abdomen and pelvis was performed following the standard protocol during bolus administration of intravenous contrast. CONTRAST:  1048mISOVUE-300 IOPAMIDOL (ISOVUE-300) INJECTION 61% COMPARISON:  07/07/2016 FINDINGS: CT CHEST FINDINGS Cardiovascular: Unremarkable Mediastinum/Nodes: Stable 6 mm in short axis lymph node adjacent to the gastroesophageal junction, image 46/2. Small type 1 hiatal hernia. Mild wall thickening of portions of the thoracic esophagus, suggesting potential esophagitis. Lungs/Pleura: Mild biapical pleuroparenchymal scarring. Scattered nodularity in the right lung, more concentrated in the right upper lobe, with a 1.7 by 1.5 cm right upper lobe nodule anteriorly on image 70/4, by my measurements previously the same size. Densely calcified 4 mm granuloma in the right lower lobe. Airway thickening is present, suggesting bronchitis or reactive airways disease. On image 38/4 there is a 1.9 by 1.3 cm ground-glass opacity centrally in the left upper lobe which appears to be new compared to prior exams. Musculoskeletal: Extensive scattered osseous metastatic disease, with some evolutionary findings including a progressive amount of lucency centrally in some of the medial right clavicular metastatic disease, but an overall stable distribution and appearance aside from these mild evolutionary findings. No compression fracture observed. No obvious vertebral destruction along the margins of the spinal canal. CT ABDOMEN PELVIS FINDINGS Hepatobiliary: There is some faint heterogeneity in the dome of the right hepatic lobe but no definite mass. Pancreas: Lobularity of the top of the pancreas appear stable and  probably incidental on image 57/2. Similar appearance back on  03/25/2014 without any focal hypermetabolic activity. Spleen: Unremarkable Adrenals/Urinary Tract: Unremarkable Stomach/Bowel: Unremarkable Vascular/Lymphatic: Unremarkable Reproductive: Unremarkable Other: Subtle stranding at the root of the mesentery, not appreciably changed from 2015. Musculoskeletal: Stable pattern of relatively diffuse osseous metastatic disease. IMPRESSION: 1. Generally stable appearance, with a dominant 1.7 by 1.5 cm right upper lobe pulmonary nodule, and diffuse osseous metastatic disease. There is some evolutionary lucency in a right medial clavicular pre-existing metastatic lesion but otherwise the osseous metastatic disease appears unchanged, and without new osseous lesions. 2. There is a new 1.9 by 1.3 cm ground-glass opacity lesion in the left upper lobe on image 38/4 which is most likely inflammatory but merits observation. 3. Micro nodularity in the lungs, especially the right upper lobe, pop possibly a residua from prior malignancy. 4. Airway thickening is present, suggesting bronchitis or reactive airways disease. Electronically Signed   By: Van Clines M.D.   On: 10/18/2016 14:04    ASSESSMENT AND PLAN: This is a very pleasant 51 years old white male with metastatic non-small cell lung cancer, adenocarcinoma with positive EGFR mutation with deletion in exon 19 status post treatment with Gilotrif for around 20 months but this was discontinued secondary to disease progression and development of EGFR T790M resistant mutation. The patient was started on treatment with third generation EGFR tyrosine kinase inhibitor, Tagrisso 80 mg by mouth daily status post 8 months of treatment. He is tolerating this treatment fairly well with no significant adverse effects. He had repeat CT scan of the chest, abdomen and pelvis performed recently. I personally and independently reviewed the scan images and discuss the results with the patient today. The scan showed no evidence for  disease progression but the development of 1.9 x 1.3 cm ground glass opacity in the left upper lobe highly suggestive to be inflammatory in origin. I recommended for the patient to continue his current treatment with Tagrisso with the same dose. We will continue to monitor the lung opacity closely on the upcoming scan. For the mild renal insufficiency, strongly advised the patient to increase his oral intake of fluid. For the drug-induced mild leukopenia and thrombocytopenia, we will continue to monitor closely. For depression, the patient will continue his current treatment with Remeron 30 mg by mouth daily at bedtime. For anxiety, she will continue on Xanax on as-needed basis. For the metastatic bone disease, the patient will continue his current treatment with Xgeva. The patient would come back for follow-up visit in one month's for reevaluation with repeat blood work. He was advised to call immediately if he has any concerning symptoms in the interval. The patient voices understanding of current disease status and treatment options and is in agreement with the current care plan.  All questions were answered. The patient knows to call the clinic with any problems, questions or concerns. We can certainly see the patient much sooner if necessary.  Disclaimer: This note was dictated with voice recognition software. Similar sounding words can inadvertently be transcribed and may not be corrected upon review.

## 2016-10-19 NOTE — Telephone Encounter (Signed)
Appointments scheduled per 2/6 LOS. Patient given AVS report and calendars with future scheduled appointments. °

## 2016-10-21 ENCOUNTER — Other Ambulatory Visit: Payer: Self-pay | Admitting: Medical Oncology

## 2016-10-21 DIAGNOSIS — Z5111 Encounter for antineoplastic chemotherapy: Secondary | ICD-10-CM

## 2016-10-21 DIAGNOSIS — C7951 Secondary malignant neoplasm of bone: Secondary | ICD-10-CM

## 2016-10-21 DIAGNOSIS — C7949 Secondary malignant neoplasm of other parts of nervous system: Secondary | ICD-10-CM

## 2016-10-21 DIAGNOSIS — C3491 Malignant neoplasm of unspecified part of right bronchus or lung: Secondary | ICD-10-CM

## 2016-10-21 DIAGNOSIS — C7931 Secondary malignant neoplasm of brain: Secondary | ICD-10-CM

## 2016-10-21 MED ORDER — OSIMERTINIB MESYLATE 80 MG PO TABS: 80.0000 mg | ORAL_TABLET | Freq: Every day | ORAL | 0 refills | Status: DC

## 2016-10-25 ENCOUNTER — Telehealth: Payer: Self-pay | Admitting: *Deleted

## 2016-10-25 ENCOUNTER — Encounter (HOSPITAL_COMMUNITY): Payer: Self-pay

## 2016-10-25 NOTE — Telephone Encounter (Signed)
Oncology Nurse Navigator Documentation  Oncology Nurse Navigator Flowsheets 10/25/2016  Navigator Location [facility]  Navigator Encounter Type Telephone  Telephone Outgoing Call/Oscar Floyd called and needs help with insurance. I called Oscar Floyd today. He states he needs a letter to submit to insurance.  I asked that he send the insurance request and I can work on letter. He was thankful for the help.   Treatment Phase Treatment  Barriers/Navigation Needs Financial  Interventions Other  Acuity Level 3  Acuity Level 3 Other  Time Spent with Patient 60

## 2016-10-26 ENCOUNTER — Telehealth: Payer: Self-pay | Admitting: *Deleted

## 2016-10-26 NOTE — Telephone Encounter (Signed)
Oncology Nurse Navigator Documentation  Oncology Nurse Navigator Flowsheets 10/26/2016  Navigator Location [facility]  Navigator Encounter Type Telephone  Telephone Outgoing Call/I updated Oscar Floyd that I called insurance company and [facility] regarding getting molecular test authorized.  I have faxed his insurance the information and NCCN guidelines to help get test authorized.  I called him today to update him that he does not need a letter at this point and I will follow up with insurance unless I hear from him. He was thankful for the help.   Treatment Phase Treatment  Barriers/Navigation Needs Financial  Acuity Level 2  Acuity Level 2 Other  Time Spent with Patient 30

## 2016-11-02 ENCOUNTER — Telehealth: Payer: Self-pay | Admitting: *Deleted

## 2016-11-02 ENCOUNTER — Encounter: Payer: Self-pay | Admitting: *Deleted

## 2016-11-02 NOTE — Telephone Encounter (Signed)
Oncology Nurse Navigator Documentation  Oncology Nurse Navigator Flowsheets 11/02/2016  Navigator Location [facility]  Navigator Encounter Type Telephone  Telephone Outgoing Call/I called [employer] again today.  I was updated on next steps.  I completed appeal form.  I called Mr. Steckman to update him and I needed more information for the form.  I was unable to reach and left vm message with my name and phone number.   Treatment Phase Treatment  Barriers/Navigation Needs Financial  Acuity Level 2  Time Spent with Patient 9

## 2016-11-02 NOTE — Progress Notes (Signed)
Oncology Nurse Navigator Documentation  Oncology Nurse Navigator Flowsheets 11/02/2016  Navigator Location [facility]  Navigator Encounter Type Other  Treatment Phase Treatment/I called [employer] today to see about claim form [facility]. I was on the phone for 2 hours and was hung up on.  I never reached [place] [employer] to get assistance.  I will continue to try to reach   Barriers/Navigation Needs Financial  Acuity Level 3  Time Spent with Patient 90

## 2016-11-03 ENCOUNTER — Telehealth: Payer: Self-pay | Admitting: *Deleted

## 2016-11-03 NOTE — Telephone Encounter (Signed)
Oncology Nurse Navigator Documentation  Oncology Nurse Navigator Flowsheets 11/03/2016  Navigator Location [facility]  Navigator Encounter Type Telephone/I received a call from Mr. Oscar Floyd today.  He left me a vm message to call. I called him back.  I updated him on insurance claim and that I needed a more information to complete.  He stated he called [employer] this am to request EOB.  I asked that he call me when he get it so I can complete appeal form.  He stated he would.  Once I get this information , I can mail to [facility].  Oscar Floyd was thankful for the help.   Telephone Incoming Call;Outgoing Call  Treatment Phase Treatment  Barriers/Navigation Needs Financial  Acuity Level 1  Time Spent with Patient 15

## 2016-11-05 ENCOUNTER — Encounter: Payer: Self-pay | Admitting: *Deleted

## 2016-11-05 NOTE — Progress Notes (Signed)
Oncology Nurse Navigator Documentation  Oncology Nurse Navigator Flowsheets 11/05/2016  Navigator Location [facility]  Navigator Encounter Type Other/I received Mr. Odea's EOB today.  I completed appeals form and mailed to [facility].  I updated Mr. Hall Busing.   Treatment Phase Treatment  Barriers/Navigation Needs Financial  Acuity Level 2  Acuity Level 2 Other  Time Spent with Patient 24

## 2016-11-17 ENCOUNTER — Encounter: Payer: Self-pay | Admitting: Internal Medicine

## 2016-11-17 ENCOUNTER — Ambulatory Visit (HOSPITAL_BASED_OUTPATIENT_CLINIC_OR_DEPARTMENT_OTHER): Payer: BLUE CROSS/BLUE SHIELD | Admitting: Internal Medicine

## 2016-11-17 ENCOUNTER — Telehealth: Payer: Self-pay | Admitting: Internal Medicine

## 2016-11-17 ENCOUNTER — Other Ambulatory Visit (HOSPITAL_BASED_OUTPATIENT_CLINIC_OR_DEPARTMENT_OTHER): Payer: BLUE CROSS/BLUE SHIELD

## 2016-11-17 VITALS — BP 132/96 | HR 75 | Temp 98.1°F | Resp 18 | Wt 217.5 lb

## 2016-11-17 DIAGNOSIS — C7949 Secondary malignant neoplasm of other parts of nervous system: Secondary | ICD-10-CM

## 2016-11-17 DIAGNOSIS — Z5111 Encounter for antineoplastic chemotherapy: Secondary | ICD-10-CM

## 2016-11-17 DIAGNOSIS — C3491 Malignant neoplasm of unspecified part of right bronchus or lung: Secondary | ICD-10-CM

## 2016-11-17 DIAGNOSIS — C7931 Secondary malignant neoplasm of brain: Secondary | ICD-10-CM

## 2016-11-17 DIAGNOSIS — C7951 Secondary malignant neoplasm of bone: Secondary | ICD-10-CM | POA: Diagnosis not present

## 2016-11-17 DIAGNOSIS — C3411 Malignant neoplasm of upper lobe, right bronchus or lung: Secondary | ICD-10-CM

## 2016-11-17 DIAGNOSIS — C787 Secondary malignant neoplasm of liver and intrahepatic bile duct: Secondary | ICD-10-CM

## 2016-11-17 LAB — COMPREHENSIVE METABOLIC PANEL
ALT: 24 U/L (ref 0–55)
ANION GAP: 8 meq/L (ref 3–11)
AST: 35 U/L — ABNORMAL HIGH (ref 5–34)
Albumin: 4 g/dL (ref 3.5–5.0)
Alkaline Phosphatase: 111 U/L (ref 40–150)
BILIRUBIN TOTAL: 0.71 mg/dL (ref 0.20–1.20)
BUN: 13.2 mg/dL (ref 7.0–26.0)
CHLORIDE: 107 meq/L (ref 98–109)
CO2: 28 meq/L (ref 22–29)
Calcium: 10.1 mg/dL (ref 8.4–10.4)
Creatinine: 1.3 mg/dL (ref 0.7–1.3)
EGFR: 65 mL/min/{1.73_m2} — AB (ref >90)
GLUCOSE: 84 mg/dL (ref 70–140)
Potassium: 3.9 mEq/L (ref 3.5–5.1)
SODIUM: 143 meq/L (ref 136–145)
TOTAL PROTEIN: 7 g/dL (ref 6.4–8.3)

## 2016-11-17 LAB — CBC WITH DIFFERENTIAL/PLATELET
BASO%: 0.5 % (ref 0.0–2.0)
Basophils Absolute: 0 10*3/uL (ref 0.0–0.1)
EOS%: 2.6 % (ref 0.0–7.0)
Eosinophils Absolute: 0.1 10*3/uL (ref 0.0–0.5)
HCT: 40 % (ref 38.4–49.9)
HGB: 13.5 g/dL (ref 13.0–17.1)
LYMPH%: 24.2 % (ref 14.0–49.0)
MCH: 30.2 pg (ref 27.2–33.4)
MCHC: 33.8 g/dL (ref 32.0–36.0)
MCV: 89.4 fL (ref 79.3–98.0)
MONO#: 0.4 10*3/uL (ref 0.1–0.9)
MONO%: 8.8 % (ref 0.0–14.0)
NEUT%: 63.9 % (ref 39.0–75.0)
NEUTROS ABS: 3.2 10*3/uL (ref 1.5–6.5)
Platelets: 132 10*3/uL — ABNORMAL LOW (ref 140–400)
RBC: 4.47 10*6/uL (ref 4.20–5.82)
RDW: 14.2 % (ref 11.0–14.6)
WBC: 4.9 10*3/uL (ref 4.0–10.3)
lymph#: 1.2 10*3/uL (ref 0.9–3.3)

## 2016-11-17 NOTE — Progress Notes (Signed)
Oscar [phone]) [phone]   Fax:(336) [phone]  OFFICE PROGRESS NOTE  Oscar Inches, MD [id number] [place] [address]  DIAGNOSIS: stage IV (T2a, N0, M1b) non-small cell lung cancer consistent with poorly differentiated adenocarcinoma with positive EGFR mutation with deletion in exon 19, diagnosed in July of 2015 and presented with right upper lobe lung mass in addition to extensive liver, brain and bone metastases.  He had disease progression and development of EGFR T790M resistant mutation in April 2017.  PRIOR THERAPY: 1) Status post whole brain irradiation under the care Dr. Tammi Klippel completed 04/12/2014. 2) Gilotrif 40 mg po daily - therapy beginning 04/03/2014. Status post approximately 20 months of therapy discontinued secondary to disease progression and development of EGFR T790M resistant mutation.   CURRENT THERAPY: 1) Tagrisso 80 mg by mouth daily status post 9 months of treatment. First dose was given on 01/12/2016. 2) Xgeva 120 g subcutaneously on monthly basis.  INTERVAL HISTORY: Oscar Floyd 51 y.o. male came to the clinic today for follow-up visit. The patient is currently on treatment with Tagrisso 80 mg by mouth daily and tolerating it well. He denied having any skin rash or diarrhea. He has no nausea, vomiting or constipation. He has no chest pain, shortness of breath, cough or hemoptysis. He has no weight loss or night sweats. He is here today for evaluation and repeat blood work.  MEDICAL HISTORY: Past Medical History:  Diagnosis Date  . Bone metastases ([place]) 08/26/2015  . Encounter for antineoplastic chemotherapy 01/06/2016  . Family history of cancer   . Hypertension   . Lung cancer ([place])    RUL lung with mets to liver, bone and brain    ALLERGIES:  has No Known Allergies.  MEDICATIONS:  Current Outpatient Prescriptions  Medication Sig Dispense Refill  . ALPRAZolam (XANAX) 1 MG tablet Reported on 02/11/2016    .  calcium-vitamin D (OSCAL) 250-125 MG-UNIT per tablet Take 1 tablet by mouth daily. Reported on 01/12/2016    . diazepam (VALIUM) 10 MG tablet Take 1 tablet (10 mg total) by mouth as directed. 30 min before MRI (Patient not taking: Reported on 09/16/2016) 15 tablet 0  . esomeprazole (NEXIUM) 20 MG capsule Take by mouth 2 (two) times daily before a meal.     . loperamide (IMODIUM) 2 MG capsule Take 2 mg by mouth as needed for diarrhea or loose stools.    . mirtazapine (REMERON) 30 MG tablet Take 1 tablet (30 mg total) by mouth at bedtime. 30 tablet 2  . naproxen sodium (ANAPROX) 220 MG tablet Take 220 mg by mouth as needed.    Marland Kitchen osimertinib mesylate (TAGRISSO) 80 MG tablet Take 1 tablet (80 mg total) by mouth daily. 30 tablet 0   No current facility-administered medications for this visit.     SURGICAL HISTORY:  Past Surgical History:  Procedure Laterality Date  . VASECTOMY    . VIDEO BRONCHOSCOPY Bilateral 03/20/2014   Procedure: VIDEO BRONCHOSCOPY WITH FLUORO;  Surgeon: Rigoberto Noel, MD;  Location: [facility];  Service: Cardiopulmonary;  Laterality: Bilateral;    REVIEW OF SYSTEMS:  A comprehensive review of systems was negative.   PHYSICAL EXAMINATION: General appearance: alert, cooperative, appears stated age and no distress Head: Normocephalic, without obvious abnormality, atraumatic Neck: no adenopathy, no JVD, supple, symmetrical, trachea midline and thyroid not enlarged, symmetric, no tenderness/mass/nodules Lymph nodes: Cervical, supraclavicular, and axillary nodes normal. Resp: clear to auscultation bilaterally Back: symmetric, no curvature.  ROM normal. No CVA tenderness. Cardio: regular rate and rhythm, S1, S2 normal, no murmur, click, rub or gallop GI: soft, non-tender; bowel sounds normal; no masses,  no organomegaly Extremities: extremities normal, atraumatic, no cyanosis or edema  ECOG PERFORMANCE STATUS: 0 - Asymptomatic  Blood pressure (!) 132/96, pulse 75, temperature  98.1 F (36.7 C), temperature source Oral, resp. rate 18, weight 217 lb 8 oz (98.7 kg), SpO2 100 %.  LABORATORY DATA: Lab Results  Component Value Date   WBC 4.9 11/17/2016   HGB 13.5 11/17/2016   HCT 40.0 11/17/2016   MCV 89.4 11/17/2016   PLT 132 (L) 11/17/2016      Chemistry      Component Value Date/Time   NA 141 10/18/2016 0900   K 4.3 10/18/2016 0900   CO2 28 10/18/2016 0900   BUN 16.2 10/18/2016 0900   CREATININE 1.4 (H) 10/18/2016 0900      Component Value Date/Time   CALCIUM 10.9 (H) 10/18/2016 0900   ALKPHOS 103 10/18/2016 0900   AST 33 10/18/2016 0900   ALT 26 10/18/2016 0900   BILITOT 0.63 10/18/2016 0900       RADIOGRAPHIC STUDIES: No results found.  ASSESSMENT AND PLAN: This is a very pleasant 51 years old white male with metastatic non-small cell lung cancer, adenocarcinoma with positive EGFR mutation with deletion in exon 19 status post 20 months treatment with Gilotrif discontinued secondary to disease progression and development of EGFR T790M resistant mutation. The patient is currently on treatment with Tagrisso 80 mg by mouth daily status post 9 months of treatment. He is tolerating this treatment well with no significant adverse effects. I recommended for him to continue his treatment with the same dose. I would see him back for follow-up visit in one month for evaluation after repeating blood work. He was advised to call immediately if he has any concerning symptoms in the interval. The patient voices understanding of current disease status and treatment options and is in agreement with the current care plan.  All questions were answered. The patient knows to call the clinic with any problems, questions or concerns. We can certainly see the patient much sooner if necessary.  Disclaimer: This note was dictated with voice recognition software. Similar sounding words can inadvertently be transcribed and may not be corrected upon review.

## 2016-11-17 NOTE — Telephone Encounter (Signed)
Appointments scheduled per 11/17/16 los. Patient was given a copy of the AVS report and appointment schedule per 11/17/16 los.

## 2016-11-19 ENCOUNTER — Other Ambulatory Visit: Payer: Self-pay | Admitting: Medical Oncology

## 2016-11-19 DIAGNOSIS — C7931 Secondary malignant neoplasm of brain: Secondary | ICD-10-CM

## 2016-11-19 DIAGNOSIS — Z5111 Encounter for antineoplastic chemotherapy: Secondary | ICD-10-CM

## 2016-11-19 DIAGNOSIS — C7951 Secondary malignant neoplasm of bone: Secondary | ICD-10-CM

## 2016-11-19 DIAGNOSIS — C3491 Malignant neoplasm of unspecified part of right bronchus or lung: Secondary | ICD-10-CM

## 2016-11-19 DIAGNOSIS — C7949 Secondary malignant neoplasm of other parts of nervous system: Secondary | ICD-10-CM

## 2016-11-19 MED ORDER — OSIMERTINIB MESYLATE 80 MG PO TABS: 80.0000 mg | ORAL_TABLET | Freq: Every day | ORAL | 0 refills | Status: DC

## 2016-12-07 ENCOUNTER — Ambulatory Visit (HOSPITAL_BASED_OUTPATIENT_CLINIC_OR_DEPARTMENT_OTHER): Payer: BLUE CROSS/BLUE SHIELD | Admitting: Nurse Practitioner

## 2016-12-07 ENCOUNTER — Encounter: Payer: Self-pay | Admitting: *Deleted

## 2016-12-07 ENCOUNTER — Telehealth: Payer: Self-pay | Admitting: Internal Medicine

## 2016-12-07 ENCOUNTER — Encounter: Payer: Self-pay | Admitting: Nurse Practitioner

## 2016-12-07 VITALS — BP 140/87 | HR 64 | Temp 97.5°F | Resp 18 | Ht 74.0 in | Wt 217.9 lb

## 2016-12-07 DIAGNOSIS — C7931 Secondary malignant neoplasm of brain: Secondary | ICD-10-CM

## 2016-12-07 DIAGNOSIS — R221 Localized swelling, mass and lump, neck: Secondary | ICD-10-CM

## 2016-12-07 DIAGNOSIS — C3411 Malignant neoplasm of upper lobe, right bronchus or lung: Secondary | ICD-10-CM | POA: Diagnosis not present

## 2016-12-07 DIAGNOSIS — C7951 Secondary malignant neoplasm of bone: Secondary | ICD-10-CM | POA: Diagnosis not present

## 2016-12-07 DIAGNOSIS — C3491 Malignant neoplasm of unspecified part of right bronchus or lung: Secondary | ICD-10-CM

## 2016-12-07 NOTE — Progress Notes (Signed)
SYMPTOM MANAGEMENT CLINIC    Chief Complaint: clavicular mass    HPI:  Oscar Floyd 51 y.o. male diagnosed with lung cancer with both bone and brain metastasis.  Patient is status post radiation treatments to the brain in the past.  Patient last received Xgeva for his bone metastasis on 01/01/2016.  Currently undergoing Tagrisso oral chemotherapy.    No history exists.    Review of Systems  Skin:       Right clavicular mass  All other systems reviewed and are negative.   Past Medical History:  Diagnosis Date  . Bone metastases ([place]) 08/26/2015  . Encounter for antineoplastic chemotherapy 01/06/2016  . Family history of cancer   . Hypertension   . Lung cancer ([place])    RUL lung with mets to liver, bone and brain    Past Surgical History:  Procedure Laterality Date  . VASECTOMY    . VIDEO BRONCHOSCOPY Bilateral 03/20/2014   Procedure: VIDEO BRONCHOSCOPY WITH FLUORO;  Surgeon: Rigoberto Noel, MD;  Location: [facility];  Service: Cardiopulmonary;  Laterality: Bilateral;    has Non-small cell cancer of right lung ([place]); Cough; Blood pressure elevated; Acid reflux; Numerous sub-centimeter brain metastases; Weight loss, abnormal; Drug-induced diarrhea; Drug-induced skin rash; Paronychia of finger; Bone metastases ([place]); Encounter for antineoplastic chemotherapy; Pharyngitis; Sinus congestion; Family history of cancer; and Genetic testing on his problem list.    has No Known Allergies.  Allergies as of 12/07/2016   No Known Allergies     Medication List       Accurate as of 12/07/16  5:19 PM. Always use your most recent med list.          ALPRAZolam 1 MG tablet Commonly known as:  XANAX Reported on 02/11/2016   calcium-vitamin D 250-125 MG-UNIT tablet Commonly known as:  OSCAL Take 1 tablet by mouth daily. Reported on 01/12/2016   diazepam 10 MG tablet Commonly known as:  VALIUM Take 1 tablet (10 mg total) by mouth as directed. 30 min before MRI   loperamide 2 MG  capsule Commonly known as:  IMODIUM Take 2 mg by mouth as needed for diarrhea or loose stools.   mirtazapine 30 MG tablet Commonly known as:  REMERON Take 1 tablet (30 mg total) by mouth at bedtime.   naproxen sodium 220 MG tablet Commonly known as:  ANAPROX Take 220 mg by mouth as needed.   NEXIUM 20 MG capsule Generic drug:  esomeprazole Take by mouth 2 (two) times daily before a meal.   osimertinib mesylate 80 MG tablet Commonly known as:  TAGRISSO Take 1 tablet (80 mg total) by mouth daily.        PHYSICAL EXAMINATION  Oncology Vitals 12/07/2016 11/17/2016  Height 188 cm -  Weight 98.839 kg 98.657 kg  Weight (lbs) 217 lbs 14 oz 217 lbs 8 oz  BMI (kg/m2) 27.98 kg/m2 27.93 kg/m2  Temp 97.5 98.1  Pulse 64 75  Resp 18 18  SpO2 100 100  BSA (m2) 2.27 m2 2.27 m2   BP Readings from Last 2 Encounters:  12/07/16 140/87  11/17/16 (!) 132/96    Physical Exam  Constitutional: He is oriented to person, place, and time and well-developed, well-nourished, and in no distress.  HENT:  Head: Normocephalic and atraumatic.  Eyes: Conjunctivae and EOM are normal. Pupils are equal, round, and reactive to light.  Neck: Normal range of motion.  Pulmonary/Chest: Effort normal. No respiratory distress.  Musculoskeletal: Normal range of motion. He exhibits edema  and tenderness.  Neurological: He is alert and oriented to person, place, and time. Gait normal.  Skin: Skin is warm and dry.  Psychiatric: Affect normal.  Nursing note and vitals reviewed.   LABORATORY DATA:. No visits with results within 3 Day(s) from this visit.  Latest known visit with results is:  Appointment on 11/17/2016  Component Date Value Ref Range Status  . WBC 11/17/2016 4.9  4.0 - 10.3 10e3/uL Final  . NEUT# 11/17/2016 3.2  1.5 - 6.5 10e3/uL Final  . HGB 11/17/2016 13.5  13.0 - 17.1 g/dL Final  . HCT 11/17/2016 40.0  38.4 - 49.9 % Final  . Platelets 11/17/2016 132* 140 - 400 10e3/uL Final  . MCV 11/17/2016  89.4  79.3 - 98.0 fL Final  . MCH 11/17/2016 30.2  27.2 - 33.4 pg Final  . MCHC 11/17/2016 33.8  32.0 - 36.0 g/dL Final  . RBC 11/17/2016 4.47  4.20 - 5.82 10e6/uL Final  . RDW 11/17/2016 14.2  11.0 - 14.6 % Final  . lymph# 11/17/2016 1.2  0.9 - 3.3 10e3/uL Final  . MONO# 11/17/2016 0.4  0.1 - 0.9 10e3/uL Final  . Eosinophils Absolute 11/17/2016 0.1  0.0 - 0.5 10e3/uL Final  . Basophils Absolute 11/17/2016 0.0  0.0 - 0.1 10e3/uL Final  . NEUT% 11/17/2016 63.9  39.0 - 75.0 % Final  . LYMPH% 11/17/2016 24.2  14.0 - 49.0 % Final  . MONO% 11/17/2016 8.8  0.0 - 14.0 % Final  . EOS% 11/17/2016 2.6  0.0 - 7.0 % Final  . BASO% 11/17/2016 0.5  0.0 - 2.0 % Final  . Sodium 11/17/2016 143  136 - 145 mEq/L Final  . Potassium 11/17/2016 3.9  3.5 - 5.1 mEq/L Final  . Chloride 11/17/2016 107  98 - 109 mEq/L Final  . CO2 11/17/2016 28  22 - 29 mEq/L Final  . Glucose 11/17/2016 84  70 - 140 mg/dl Final  . BUN 11/17/2016 13.2  7.0 - 26.0 mg/dL Final  . Creatinine 11/17/2016 1.3  0.7 - 1.3 mg/dL Final  . Total Bilirubin 11/17/2016 0.71  0.20 - 1.20 mg/dL Final  . Alkaline Phosphatase 11/17/2016 111  40 - 150 U/L Final  . AST 11/17/2016 35* 5 - 34 U/L Final  . ALT 11/17/2016 24  0 - 55 U/L Final  . Total Protein 11/17/2016 7.0  6.4 - 8.3 g/dL Final  . Albumin 11/17/2016 4.0  3.5 - 5.0 g/dL Final  . Calcium 11/17/2016 10.1  8.4 - 10.4 mg/dL Final  . Anion Gap 11/17/2016 8  3 - 11 mEq/L Final  . EGFR 11/17/2016 65* >90 ml/min/1.73 m2 Final    RADIOGRAPHIC STUDIES: No results found.  ASSESSMENT/PLAN:    Non-small cell cancer of right lung [facility]) Patient is status post radiation treatment to the rain in the past.  Received his last Xgeva on 01/01/2016.  Currently undergoing Tagrisso oral therapy.   Patient presented to the [place] today with complaint of some mild edema and tenderness to the right clavicular region.  He denies any known injury or trauma to the area.  He denies any other symptoms  whatsoever.  He denies any recent fevers or chills.  Exam reveals slight edema and trace tenderness only with palpation to the right clavicular site when compared to the left.  Patient's last CT was 10/18/2016; and revealed probable right clavicular bone metastasis at that time.  Advice patient would review all findings with Dr. Julien Nordmann for singing in the morning; and  then call him with the recommended plan for further evaluation.  In the meantime.  Patient was advised to call/return or go directly to the emergency department for any worsening symptoms or issues.   Patient stated understanding of all instructions; and was in agreement with this plan of care. The patient knows to call the clinic with any problems, questions or concerns.   Total time spent with patient was 25 minutes;  with greater than 75 percent of that time spent in face to face counseling regarding patient's symptoms,  and coordination of care and follow up.  Disclaimer:This dictation was prepared with Dragon/digital dictation along with [employer]. Any transcriptional errors that result from this process are unintentional.  Drue Second, NP 12/07/2016

## 2016-12-07 NOTE — Progress Notes (Signed)
Oncology Nurse Navigator Documentation  Oncology Nurse Navigator Flowsheets 12/07/2016  Navigator Location [facility]  Navigator Encounter Type Telephone  Telephone Incoming Call;Outgoing Call/I received a call from Mr. Hall Busing. I called him back.  He states he is having some soreness underneath his collar bone.  I updated Dr. Julien Nordmann.  He states he would like him to be seen today by symptom management.  I updated symptom management rn and completed urgent LOS for scheduling to call and arrange appt.   Treatment Phase Treatment  Barriers/Navigation Needs Coordination of Care  Interventions Coordination of Care  Coordination of Care Appts  Acuity Level 2  Time Spent with Patient 30

## 2016-12-07 NOTE — Progress Notes (Signed)
Pt presented to [facility] with small swelling right clavicular area, tender to touch compared with left side. Otherwise feels well. Denies fever, chills or any other changes.  Tolerating oral chemo Tagrisso well

## 2016-12-07 NOTE — Telephone Encounter (Signed)
sw pt to confirm [facility] appt today at 3 pm per LOS

## 2016-12-07 NOTE — Assessment & Plan Note (Addendum)
Patient is status post radiation treatment to the rain in the past.  Received his last Xgeva on 01/01/2016.  Currently undergoing Tagrisso oral therapy.   Patient presented to the [facility] today with complaint of some mild edema and tenderness to the right clavicular region.  He denies any known injury or trauma to the area.  He denies any other symptoms whatsoever.  He denies any recent fevers or chills.  Exam reveals slight edema and trace tenderness only with palpation to the right clavicular site when compared to the left.  Patient's last CT was 10/18/2016; and revealed probable right clavicular bone metastasis at that time.  Advice patient would review all findings with Dr. Julien Nordmann for singing in the morning; and then call him with the recommended plan for further evaluation.  In the meantime.  Patient was advised to call/return or go directly to the emergency department for any worsening symptoms or issues.

## 2016-12-08 ENCOUNTER — Other Ambulatory Visit: Payer: Self-pay | Admitting: Nurse Practitioner

## 2016-12-08 ENCOUNTER — Telehealth: Payer: Self-pay | Admitting: Nurse Practitioner

## 2016-12-08 DIAGNOSIS — C3491 Malignant neoplasm of unspecified part of right bronchus or lung: Secondary | ICD-10-CM

## 2016-12-08 NOTE — Telephone Encounter (Signed)
Reviewed all findings of yesterday's visit with Dr. Julien Nordmann; and he recommended that patient undergo a CT of the chest for further evaluation of the painful mass to the right clavicular region.  Then, this provider called the patient, to advise him that patient will be scheduled for a CT with contrast of the chest tomorrow morning, Thursday, 12/09/2016 at 7:30 AM.  Advice patient would call in review the scan results with him once they're received a later tomorrow afternoon.  Patient is anxious to hear the results of his scan.

## 2016-12-09 ENCOUNTER — Encounter (HOSPITAL_COMMUNITY): Payer: Self-pay

## 2016-12-09 ENCOUNTER — Telehealth: Payer: Self-pay | Admitting: *Deleted

## 2016-12-09 ENCOUNTER — Ambulatory Visit
Admission: RE | Admit: 2016-12-09 | Discharge: 2016-12-09 | Disposition: A | Discharge status: Home / Self Care | Payer: BLUE CROSS/BLUE SHIELD | Source: Ambulatory Visit | Attending: Radiation Oncology | Admitting: Radiation Oncology

## 2016-12-09 ENCOUNTER — Ambulatory Visit (HOSPITAL_COMMUNITY): Payer: BLUE CROSS/BLUE SHIELD

## 2016-12-09 ENCOUNTER — Ambulatory Visit (HOSPITAL_COMMUNITY)
Admission: RE | Admit: 2016-12-09 | Discharge: 2016-12-09 | Disposition: A | Discharge status: Home / Self Care | Payer: BLUE CROSS/BLUE SHIELD | Source: Ambulatory Visit | Attending: Nurse Practitioner | Admitting: Nurse Practitioner

## 2016-12-09 ENCOUNTER — Telehealth: Payer: Self-pay | Admitting: Nurse Practitioner

## 2016-12-09 DIAGNOSIS — C3491 Malignant neoplasm of unspecified part of right bronchus or lung: Secondary | ICD-10-CM | POA: Insufficient documentation

## 2016-12-09 DIAGNOSIS — C7951 Secondary malignant neoplasm of bone: Secondary | ICD-10-CM

## 2016-12-09 DIAGNOSIS — Z510 Encounter for antineoplastic radiation therapy: Secondary | ICD-10-CM | POA: Insufficient documentation

## 2016-12-09 MED ORDER — IOPAMIDOL (ISOVUE-300) INJECTION 61%
75.0000 mL | Freq: Once | INTRAVENOUS | Status: AC | PRN
  Administered 2016-12-09: 75 mL via INTRAVENOUS

## 2016-12-09 MED ORDER — IOPAMIDOL (ISOVUE-300) INJECTION 61%
INTRAVENOUS | Status: AC
  Filled 2016-12-09: qty 75

## 2016-12-09 NOTE — Progress Notes (Signed)
Radiation Oncology         [phone]) [phone] ________________________________  Name: Oscar Floyd MRN: [id number]  Date: 12/09/2016  DOB: 1966-05-10  Follow-Up Visit Note  CC: [name], MD  [name], MD  Diagnosis:   51 yo man with EGFR positive [name] of the right upper lobe with painful right clavicle metastasis    ICD-9-CM ICD-10-CM   1. Bone metastases ([facility]) 198.5 C79.51     Narrative:  The patient returns today for right shoulder pain and CT showed a worsening expansile right clavicle metastasis.    He has kindly been referred today for discussion of possible radiation treatment options.           ALLERGIES:  has No Known Allergies.  Meds: Current Outpatient Prescriptions  Medication Sig Dispense Refill  . ALPRAZolam (XANAX) 1 MG tablet Reported on 02/11/2016    . calcium-vitamin D (OSCAL) 250-125 MG-UNIT per tablet Take 1 tablet by mouth daily. Reported on 01/12/2016    . diazepam (VALIUM) 10 MG tablet Take 1 tablet (10 mg total) by mouth as directed. 30 min before MRI (Patient not taking: Reported on 09/16/2016) 15 tablet 0  . esomeprazole (NEXIUM) 20 MG capsule Take by mouth 2 (two) times daily before a meal.     . loperamide (IMODIUM) 2 MG capsule Take 2 mg by mouth as needed for diarrhea or loose stools.    . mirtazapine (REMERON) 30 MG tablet Take 1 tablet (30 mg total) by mouth at bedtime. 30 tablet 2  . naproxen sodium (ANAPROX) 220 MG tablet Take 220 mg by mouth as needed.    Marland Kitchen osimertinib mesylate (TAGRISSO) 80 MG tablet Take 1 tablet (80 mg total) by mouth daily. 30 tablet 0   No current facility-administered medications for this encounter.    Facility-Administered Medications Ordered in Other Encounters  Medication Dose Route Frequency Provider Last Rate Last Dose  . iopamidol (ISOVUE-300) 61 % injection             Physical Findings: The patient is in no acute distress. Patient is alert and oriented.  vitals were not  taken for this visit..  No significant changes.  Lab Findings: Lab Results  Component Value Date   WBC 4.9 11/17/2016   HGB 13.5 11/17/2016   HCT 40.0 11/17/2016   PLT 132 (L) 11/17/2016    Lab Results  Component Value Date   NA 143 11/17/2016   K 3.9 11/17/2016   CHLORIDE 107 11/17/2016   CO2 28 11/17/2016   GLUCOSE 84 11/17/2016   BUN 13.2 11/17/2016   CREATININE 1.3 11/17/2016   BILITOT 0.71 11/17/2016   ALKPHOS 111 11/17/2016   AST 35 (H) 11/17/2016   ALT 24 11/17/2016   PROT 7.0 11/17/2016   ALBUMIN 4.0 11/17/2016   CALCIUM 10.1 11/17/2016   ANIONGAP 8 11/17/2016    Radiographic Findings: Ct Chest W Contrast  Result Date: 12/09/2016 CLINICAL DATA:  Lung cancer with metastatic disease to the liver and brain and skeleton. Oral chemotherapy in progress. Radiation complete. New painful mass to the RIGHT clavicle region. EXAM: CT CHEST WITH CONTRAST TECHNIQUE: Multidetector CT imaging of the chest was performed during intravenous contrast administration. CONTRAST:  89m ISOVUE-300 IOPAMIDOL (ISOVUE-300) INJECTION 61% COMPARISON:  CT 10/18/2016, 07/07/2016, PET-CT 03/25/1949 FINDINGS: Cardiovascular: No significant vascular findings. Normal heart size. No pericardial effusion. Mediastinum/Nodes: No axillary or supraclavicular lymphadenopathy. No mediastinal hilar lymphadenopathy. Esophagus normal. Lungs/Pleura: Dominant nodule in the RIGHT upper lobe measures 15  mm (image 79, series 5 unchanged from 16 mm (CT 07/07/2016). Smaller nodule in the RIGHT upper lobe measuring 4 mm (image 44, series 5 is also unchanged scattered nodules along the horizontal fissure are also stable (image 71, series Upper Abdomen: Limited view of the liver, kidneys, pancreas are unremarkable. Normal adrenal glands. Musculoskeletal: Multiple sclerotic skeletal metastasis throughout the spine, sternum and ribs are unchanged from CT 07/07/2016. Attention directed towards the RIGHT clavicle. Where previously  there was a sclerotic lesion in the medial LEFT RIGHT clavicle there is now a lytic lesion which is slightly expansile measuring 24 mm in with (image 26, series 2 compared with 22 mm on CT 07/07/2016. Lesion measured 22 mm on CT 11/07/2016 additionally. IMPRESSION: 1. Newly lytic and minimally expansile lesion within the medial aspect of the RIGHT clavicle compared to CT of 07/07/2016. (Sclerotic metastasis lesion at this site on comparison CT, lesion metabolic on remote PET-CT scan 03/25/2014 along with multiple other lesions). 2. Widespread sclerotic metastasis are not changed. 3. Stable pulmonary nodules. Electronically Signed   By: Suzy Bouchard M.D.   On: 12/09/2016 08:55    Impression:  The patient has a painful right clavicle met.  He may benefit from palliative radiotherapy.  Plan:  Today, I talked to the patient and family about the findings and work-up thus far.  We discussed the natural history of painful bone metastasis and general treatment, highlighting the role of radiotherapy in the management.  We discussed the available radiation techniques, and focused on the details of logistics and delivery.  We reviewed the anticipated acute and late sequelae associated with radiation in this setting.  The patient was encouraged to ask questions that I answered to the best of my ability.  I filled out a patient counseling form during our discussion including treatment diagrams.  We retained a copy for our records.  The patient would like to proceed with radiation and will be scheduled for CT simulation today.  I spent 20 minutes minutes face to face with the patient and more than 50% of that time was spent in counseling and/or coordination of care.   _____________________________________  Sheral Apley. Tammi Klippel, M.D.

## 2016-12-09 NOTE — Telephone Encounter (Signed)
Patient had a CT with contrast of the chest earlier this morning.  Reviewed all results with Dr. Julien Nordmann; and then called patient to review the CT results as well.  Patient's chest CT results revealed that the disease is stable; with the exception of a right clavicular lytic lesion which has slightly progressed.  Dr. Julien Nordmann recommended that patient continues with his oral chemotherapy as previously directed; and this provider will also contact Dr. Ledon Snare radiation oncologist to see if the right clavicle region can be irradiated.  Patient stated understanding of all instructions and was in agreement with this plan of care.

## 2016-12-09 NOTE — Progress Notes (Signed)
  Radiation Oncology         (336) [phone] ________________________________  Name: Clee Pandit MRN: [id number]  Date: 12/09/2016  DOB: [birth year]  SIMULATION AND TREATMENT PLANNING NOTE    ICD-9-CM ICD-10-CM   1. Bone metastases ([facility]) 198.5 C79.51     DIAGNOSIS:  51 yo man with EGFR positive [name] of the right upper lobe with painful right clavicle metastasis  NARRATIVE:  The patient was brought to the [facility].  Identity was confirmed.  All relevant records and images related to the planned course of therapy were reviewed.  The patient freely provided informed written consent to proceed with treatment after reviewing the details related to the planned course of therapy. The consent form was witnessed and verified by the simulation staff.  Then, the patient was set-up in a stable reproducible  supine position for radiation therapy.  CT images were obtained.  Surface markings were placed.  The CT images were loaded into the planning software.  Then the target and avoidance structures were contoured.  Treatment planning then occurred.  The radiation prescription was entered and confirmed.  Then, I designed and supervised the construction of a total of 3 medically necessary complex treatment devices.  I have requested : Isodose Plan.   PLAN:  The patient will receive 30 Gy in 10 fractions.  ________________________________  Sheral Apley Tammi Klippel, M.D.

## 2016-12-09 NOTE — Telephone Encounter (Signed)
[facility] Radiology called and gave a verbal report regarding CT scan. This information to be given to Selena Lesser, NP, who ordered the scan. Scan printed from [employer].

## 2016-12-10 DIAGNOSIS — Z510 Encounter for antineoplastic radiation therapy: Secondary | ICD-10-CM | POA: Diagnosis not present

## 2016-12-13 ENCOUNTER — Ambulatory Visit
Admission: RE | Admit: 2016-12-13 | Discharge: 2016-12-13 | Disposition: A | Discharge status: Home / Self Care | Payer: BLUE CROSS/BLUE SHIELD | Source: Ambulatory Visit | Attending: Radiation Oncology | Admitting: Radiation Oncology

## 2016-12-13 DIAGNOSIS — Z510 Encounter for antineoplastic radiation therapy: Secondary | ICD-10-CM | POA: Diagnosis not present

## 2016-12-14 ENCOUNTER — Ambulatory Visit
Admission: RE | Admit: 2016-12-14 | Discharge: 2016-12-14 | Disposition: A | Discharge status: Home / Self Care | Payer: BLUE CROSS/BLUE SHIELD | Source: Ambulatory Visit | Attending: Radiation Oncology | Admitting: Radiation Oncology

## 2016-12-14 DIAGNOSIS — Z510 Encounter for antineoplastic radiation therapy: Secondary | ICD-10-CM | POA: Diagnosis not present

## 2016-12-15 ENCOUNTER — Ambulatory Visit
Admission: RE | Admit: 2016-12-15 | Discharge: 2016-12-15 | Disposition: A | Discharge status: Home / Self Care | Payer: BLUE CROSS/BLUE SHIELD | Source: Ambulatory Visit | Attending: Radiation Oncology | Admitting: Radiation Oncology

## 2016-12-15 DIAGNOSIS — Z510 Encounter for antineoplastic radiation therapy: Secondary | ICD-10-CM | POA: Diagnosis not present

## 2016-12-16 ENCOUNTER — Encounter: Payer: Self-pay | Admitting: Internal Medicine

## 2016-12-16 ENCOUNTER — Other Ambulatory Visit (HOSPITAL_BASED_OUTPATIENT_CLINIC_OR_DEPARTMENT_OTHER): Payer: BLUE CROSS/BLUE SHIELD

## 2016-12-16 ENCOUNTER — Encounter: Payer: Self-pay | Admitting: *Deleted

## 2016-12-16 ENCOUNTER — Ambulatory Visit
Admission: RE | Admit: 2016-12-16 | Discharge: 2016-12-16 | Disposition: A | Discharge status: Home / Self Care | Payer: BLUE CROSS/BLUE SHIELD | Source: Ambulatory Visit | Attending: Radiation Oncology | Admitting: Radiation Oncology

## 2016-12-16 ENCOUNTER — Telehealth: Payer: Self-pay | Admitting: Internal Medicine

## 2016-12-16 ENCOUNTER — Ambulatory Visit (HOSPITAL_BASED_OUTPATIENT_CLINIC_OR_DEPARTMENT_OTHER): Payer: BLUE CROSS/BLUE SHIELD | Admitting: Internal Medicine

## 2016-12-16 VITALS — BP 114/75 | HR 69 | Temp 98.1°F | Resp 18 | Ht 74.0 in | Wt 216.1 lb

## 2016-12-16 DIAGNOSIS — C7949 Secondary malignant neoplasm of other parts of nervous system: Secondary | ICD-10-CM

## 2016-12-16 DIAGNOSIS — C3491 Malignant neoplasm of unspecified part of right bronchus or lung: Secondary | ICD-10-CM

## 2016-12-16 DIAGNOSIS — C787 Secondary malignant neoplasm of liver and intrahepatic bile duct: Secondary | ICD-10-CM | POA: Diagnosis not present

## 2016-12-16 DIAGNOSIS — C3411 Malignant neoplasm of upper lobe, right bronchus or lung: Secondary | ICD-10-CM

## 2016-12-16 DIAGNOSIS — Z510 Encounter for antineoplastic radiation therapy: Secondary | ICD-10-CM | POA: Diagnosis not present

## 2016-12-16 DIAGNOSIS — C7931 Secondary malignant neoplasm of brain: Secondary | ICD-10-CM

## 2016-12-16 DIAGNOSIS — C7951 Secondary malignant neoplasm of bone: Secondary | ICD-10-CM

## 2016-12-16 DIAGNOSIS — Z5111 Encounter for antineoplastic chemotherapy: Secondary | ICD-10-CM

## 2016-12-16 LAB — COMPREHENSIVE METABOLIC PANEL
ALBUMIN: 3.9 g/dL (ref 3.5–5.0)
ALT: 21 U/L (ref 0–55)
AST: 32 U/L (ref 5–34)
Alkaline Phosphatase: 109 U/L (ref 40–150)
Anion Gap: 8 mEq/L (ref 3–11)
BUN: 15.7 mg/dL (ref 7.0–26.0)
CO2: 26 meq/L (ref 22–29)
Calcium: 10.2 mg/dL (ref 8.4–10.4)
Chloride: 108 mEq/L (ref 98–109)
Creatinine: 1.4 mg/dL — ABNORMAL HIGH (ref 0.7–1.3)
EGFR: 58 mL/min/{1.73_m2} — ABNORMAL LOW (ref >90)
GLUCOSE: 120 mg/dL (ref 70–140)
Potassium: 4.5 mEq/L (ref 3.5–5.1)
SODIUM: 142 meq/L (ref 136–145)
TOTAL PROTEIN: 6.7 g/dL (ref 6.4–8.3)
Total Bilirubin: 0.52 mg/dL (ref 0.20–1.20)

## 2016-12-16 LAB — CBC WITH DIFFERENTIAL/PLATELET
BASO%: 0.3 % (ref 0.0–2.0)
Basophils Absolute: 0 10*3/uL (ref 0.0–0.1)
EOS ABS: 0.1 10*3/uL (ref 0.0–0.5)
EOS%: 3.6 % (ref 0.0–7.0)
HCT: 39.6 % (ref 38.4–49.9)
HEMOGLOBIN: 13.5 g/dL (ref 13.0–17.1)
LYMPH%: 36.3 % (ref 14.0–49.0)
MCH: 30.5 pg (ref 27.2–33.4)
MCHC: 34.1 g/dL (ref 32.0–36.0)
MCV: 89.6 fL (ref 79.3–98.0)
MONO#: 0.3 10*3/uL (ref 0.1–0.9)
MONO%: 8.6 % (ref 0.0–14.0)
NEUT%: 51.2 % (ref 39.0–75.0)
NEUTROS ABS: 1.6 10*3/uL (ref 1.5–6.5)
PLATELETS: 125 10*3/uL — AB (ref 140–400)
RBC: 4.42 10*6/uL (ref 4.20–5.82)
RDW: 13.6 % (ref 11.0–14.6)
WBC: 3 10*3/uL — AB (ref 4.0–10.3)
lymph#: 1.1 10*3/uL (ref 0.9–3.3)

## 2016-12-16 NOTE — Progress Notes (Signed)
[place] [phone]   [phone]  OFFICE PROGRESS NOTE  [name], MD [address] [place] [address]  DIAGNOSIS: stage IV (T2a, N0, M1b) non-small cell lung cancer consistent with poorly differentiated adenocarcinoma with positive EGFR mutation with deletion in exon 19, diagnosed in July of 2015 and presented with right upper lobe lung mass in addition to extensive liver, brain and bone metastases.  He had disease progression and development of EGFR T790M resistant mutation in April 2017.  PRIOR THERAPY: 1) Status post whole brain irradiation under the care Dr. Tammi Klippel completed 04/12/2014. 2) Gilotrif 40 mg po daily - therapy beginning 04/03/2014. Status post approximately 20 months of therapy discontinued secondary to disease progression and development of EGFR T790M resistant mutation.  3) palliative radiotherapy to the lytic lesion in the right clavicle.  CURRENT THERAPY: 1) Tagrisso 80 mg by mouth daily status post 9 months of treatment. First dose was given on 01/12/2016. 2) Xgeva 120 g subcutaneously on monthly basis.  INTERVAL HISTORY: Oscar Floyd 51 y.o. male returns to the clinic today for follow-up visit. The patient is feeling fine with no specific complaints except for pain on the right clavicular area with swelling. He had repeat CT scan of the chest performed recently that showed increase in the lytic lesion in the right clavicular head. He was referred to radiation oncology and started palliative radiotherapy to this lesion. He denied having any chest pain, shortness of breath, cough or hemoptysis. He denied having any significant skin rash or diarrhea. He has no nausea or vomiting. He denied having any significant weight loss or night sweats. He is here today for evaluation and repeat blood work.  MEDICAL HISTORY: Past Medical History:  Diagnosis Date  . Bone metastases ([place]) 08/26/2015  . Encounter for  antineoplastic chemotherapy 01/06/2016  . Family history of cancer   . Hypertension   . Lung cancer ([place])    RUL lung with mets to liver, bone and brain    ALLERGIES:  has No Known Allergies.  MEDICATIONS:  Current Outpatient Prescriptions  Medication Sig Dispense Refill  . ALPRAZolam (XANAX) 1 MG tablet Reported on 02/11/2016    . calcium-vitamin D (OSCAL) 250-125 MG-UNIT per tablet Take 1 tablet by mouth daily. Reported on 01/12/2016    . diazepam (VALIUM) 10 MG tablet Take 1 tablet (10 mg total) by mouth as directed. 30 min before MRI (Patient not taking: Reported on 09/16/2016) 15 tablet 0  . esomeprazole (NEXIUM) 20 MG capsule Take by mouth 2 (two) times daily before a meal.     . loperamide (IMODIUM) 2 MG capsule Take 2 mg by mouth as needed for diarrhea or loose stools.    . mirtazapine (REMERON) 30 MG tablet Take 1 tablet (30 mg total) by mouth at bedtime. 30 tablet 2  . naproxen sodium (ANAPROX) 220 MG tablet Take 220 mg by mouth as needed.    Marland Kitchen osimertinib mesylate (TAGRISSO) 80 MG tablet Take 1 tablet (80 mg total) by mouth daily. 30 tablet 0   No current facility-administered medications for this visit.     SURGICAL HISTORY:  Past Surgical History:  Procedure Laterality Date  . VASECTOMY    . VIDEO BRONCHOSCOPY Bilateral 03/20/2014   Procedure: VIDEO BRONCHOSCOPY WITH FLUORO;  Surgeon: Rigoberto Noel, MD;  Location: [facility];  Service: Cardiopulmonary;  Laterality: Bilateral;    REVIEW OF SYSTEMS:  A comprehensive review of systems was negative.  PHYSICAL EXAMINATION: General appearance: alert, cooperative, appears stated age and no distress Head: Normocephalic, without obvious abnormality, atraumatic Neck: no adenopathy, no JVD, supple, symmetrical, trachea midline and thyroid not enlarged, symmetric, no tenderness/mass/nodules Lymph nodes: Cervical, supraclavicular, and axillary nodes normal. Resp: clear to auscultation bilaterally Back: symmetric, no curvature. ROM  normal. No CVA tenderness. Cardio: regular rate and rhythm, S1, S2 normal, no murmur, click, rub or gallop GI: soft, non-tender; bowel sounds normal; no masses,  no organomegaly Extremities: extremities normal, atraumatic, no cyanosis or edema  ECOG PERFORMANCE STATUS: 0 - Asymptomatic  There were no vitals taken for this visit.  LABORATORY DATA: Lab Results  Component Value Date   WBC 3.0 (L) 12/16/2016   HGB 13.5 12/16/2016   HCT 39.6 12/16/2016   MCV 89.6 12/16/2016   PLT 125 (L) 12/16/2016      Chemistry      Component Value Date/Time   NA 143 11/17/2016 1507   K 3.9 11/17/2016 1507   CO2 28 11/17/2016 1507   BUN 13.2 11/17/2016 1507   CREATININE 1.3 11/17/2016 1507      Component Value Date/Time   CALCIUM 10.1 11/17/2016 1507   ALKPHOS 111 11/17/2016 1507   AST 35 (H) 11/17/2016 1507   ALT 24 11/17/2016 1507   BILITOT 0.71 11/17/2016 1507       RADIOGRAPHIC STUDIES: Ct Chest W Contrast  Result Date: 12/09/2016 CLINICAL DATA:  Lung cancer with metastatic disease to the liver and brain and skeleton. Oral chemotherapy in progress. Radiation complete. New painful mass to the RIGHT clavicle region. EXAM: CT CHEST WITH CONTRAST TECHNIQUE: Multidetector CT imaging of the chest was performed during intravenous contrast administration. CONTRAST:  69m ISOVUE-300 IOPAMIDOL (ISOVUE-300) INJECTION 61% COMPARISON:  CT 10/18/2016, 07/07/2016, PET-CT 03/25/1949 FINDINGS: Cardiovascular: No significant vascular findings. Normal heart size. No pericardial effusion. Mediastinum/Nodes: No axillary or supraclavicular lymphadenopathy. No mediastinal hilar lymphadenopathy. Esophagus normal. Lungs/Pleura: Dominant nodule in the RIGHT upper lobe measures 15 mm (image 79, series 5 unchanged from 16 mm (CT 07/07/2016). Smaller nodule in the RIGHT upper lobe measuring 4 mm (image 44, series 5 is also unchanged scattered nodules along the horizontal fissure are also stable (image 71, series Upper  Abdomen: Limited view of the liver, kidneys, pancreas are unremarkable. Normal adrenal glands. Musculoskeletal: Multiple sclerotic skeletal metastasis throughout the spine, sternum and ribs are unchanged from CT 07/07/2016. Attention directed towards the RIGHT clavicle. Where previously there was a sclerotic lesion in the medial LEFT RIGHT clavicle there is now a lytic lesion which is slightly expansile measuring 24 mm in with (image 26, series 2 compared with 22 mm on CT 07/07/2016. Lesion measured 22 mm on CT 11/07/2016 additionally. IMPRESSION: 1. Newly lytic and minimally expansile lesion within the medial aspect of the RIGHT clavicle compared to CT of 07/07/2016. (Sclerotic metastasis lesion at this site on comparison CT, lesion metabolic on remote PET-CT scan 03/25/2014 along with multiple other lesions). 2. Widespread sclerotic metastasis are not changed. 3. Stable pulmonary nodules. Electronically Signed   By: SSuzy BouchardM.D.   On: 12/09/2016 08:55    ASSESSMENT AND PLAN: This is a very pleasant 51years old white male with metastatic non-small cell lung cancer, adenocarcinoma with positive EGFR mutation with deletion in 10 19 status post 20 months treatment with Gilotrif discontinued secondary to disease progression and development of EGFR T790M resistant mutation. He is currently undergoing treatment with Tagrisso 80 mg by mouth daily. He is status post 10 months of treatment. He  is tolerating the treatment well except for the new development of enlarging lytic lesions in the right clavicle. I discussed the scan results with the patient and recommended for him to continue with his current treatment with Gilotrif but he will also continue with the palliative radiotherapy to the right clavicular area. I will see him back for follow-up visit in one month's for reevaluation with repeat blood work. He will resume his treatment with Xgeva on monthly basis. The patient was advised to call  immediately if he has any concerning symptoms in the interval. The patient voices understanding of current disease status and treatment options and is in agreement with the current care plan.  All questions were answered. The patient knows to call the clinic with any problems, questions or concerns. We can certainly see the patient much sooner if necessary. I spent 10 minutes counseling the patient face to face. The total time spent in the appointment was 15 minutes.  Disclaimer: This note was dictated with voice recognition software. Similar sounding words can inadvertently be transcribed and may not be corrected upon review.

## 2016-12-16 NOTE — Progress Notes (Signed)
Oncology Nurse Navigator Documentation  Oncology Nurse Navigator Flowsheets 12/16/2016  Navigator Location [facility]  Navigator Encounter Type Clinic/MDC  Patient Visit Type MedOnc/I spoke with Mr. Oscar Floyd today.  He is doing well.  He does have disease progression in the right clavical.  Dr. Julien Nordmann will be starting Del[place] See again.  I explained to Oscar Floyd the reason for medication and side effects.  I encouraged him to take him Calcium and Vit D while on Xgeva.  Oscar Floyd verbalized understanding of instructions.   Treatment Phase Treatment  Barriers/Navigation Needs Education  Education Other  Interventions Other;Education  Education Method Verbal  Acuity Level 2  Acuity Level 2 Other;Educational needs  Time Spent with Patient 30

## 2016-12-16 NOTE — Telephone Encounter (Signed)
Appointments scheduled per 4.5.18 LOS. Patient given AVS report and calendars with future scheduled appointments. °

## 2016-12-17 ENCOUNTER — Ambulatory Visit
Admission: RE | Admit: 2016-12-17 | Discharge: 2016-12-17 | Disposition: A | Discharge status: Home / Self Care | Payer: BLUE CROSS/BLUE SHIELD | Source: Ambulatory Visit | Attending: Radiation Oncology | Admitting: Radiation Oncology

## 2016-12-17 ENCOUNTER — Other Ambulatory Visit: Payer: Self-pay | Admitting: Radiation Therapy

## 2016-12-17 VITALS — BP 126/89 | HR 66 | Resp 18 | Wt 217.2 lb

## 2016-12-17 DIAGNOSIS — Z510 Encounter for antineoplastic radiation therapy: Secondary | ICD-10-CM | POA: Diagnosis not present

## 2016-12-17 DIAGNOSIS — C7949 Secondary malignant neoplasm of other parts of nervous system: Principal | ICD-10-CM

## 2016-12-17 DIAGNOSIS — C7931 Secondary malignant neoplasm of brain: Secondary | ICD-10-CM

## 2016-12-17 DIAGNOSIS — C3491 Malignant neoplasm of unspecified part of right bronchus or lung: Secondary | ICD-10-CM

## 2016-12-17 MED ORDER — SUCRALFATE 1 G PO TABS: 1.0000 g | ORAL_TABLET | Freq: Three times a day (TID) | ORAL | 0 refills | Status: DC

## 2016-12-17 NOTE — Progress Notes (Signed)
Weight and vitals stable. Reports head of right clavicle remains sore and painful 3 on a scale of 0-10. Reports edema at right clavicle as well. Full ROM of right arm noted. Reports new onset of sore throat. No skin changes within treatment field. Denies fatigue.   BP 126/89 (BP Location: Left Arm, Patient Position: Sitting, Cuff Size: Normal)   Pulse 66   Resp 18   Wt 217 lb 3.2 oz (98.5 kg)   SpO2 100%   BMI 27.89 kg/m  Wt Readings from Last 3 Encounters:  12/17/16 217 lb 3.2 oz (98.5 kg)  12/16/16 216 lb 1.6 oz (98 kg)  12/07/16 217 lb 14.4 oz (98.8 kg)

## 2016-12-17 NOTE — Progress Notes (Signed)
  Radiation Oncology         [phone] ________________________________  Name: Oscar Floyd MRN: [id number]  Date: 12/17/2016  DOB: Feb 06, 1966    Weekly Radiation Therapy Management    ICD-9-CM ICD-10-CM   1. Non-small cell cancer of right lung (HCC) 162.9 C34.91      Current Dose: 15 Gy     Planned Dose:  30 Gy  Narrative . . . . . . . . The patient presents for routine under treatment assessment.                                 Weight and vitals stable. Reports head of right clavicle remains sore and painful 3 on a scale of 0-10. Reports edema at right clavicle as well. Full ROM of right arm noted. Reports new onset of sore throat. No skin changes within treatment field. Denies fatigue. Denies cough or breathing problems.                                  Set-up films were reviewed.                                 The chart was checked. Physical Findings. . .  weight is 217 lb 3.2 oz (98.5 kg). His blood pressure is 126/89 and his pulse is 66. His respiration is 18 and oxygen saturation is 100%. . Weight essentially stable.  No significant changes. Lungs are clear to auscultation bilaterally. Heart has regular rate and rhythm. Abdomen soft, non-tender, normal bowel sounds. Erythema to the right clavicular head region.  Impression . . . . . . . The patient is tolerating radiation. Plan . . . . . . . . . . . . Continue treatment as planned. Patient was given a prescription for Carafate in light of his esophageal symptoms  ________________________________   Blair Promise, PhD, MD  This document serves as a record of services personally performed by Gery Pray, MD and Shona Simpson, PA-C. It was created on their behalf by Arlyce Harman, a trained medical scribe. The creation of this record is based on the scribe's personal observations and the provider's statements to them. This document has been checked and approved by the attending provider.

## 2016-12-20 ENCOUNTER — Ambulatory Visit
Admission: RE | Admit: 2016-12-20 | Discharge: 2016-12-20 | Disposition: A | Discharge status: Home / Self Care | Payer: BLUE CROSS/BLUE SHIELD | Source: Ambulatory Visit | Attending: Radiation Oncology | Admitting: Radiation Oncology

## 2016-12-20 DIAGNOSIS — Z510 Encounter for antineoplastic radiation therapy: Secondary | ICD-10-CM | POA: Diagnosis not present

## 2016-12-21 ENCOUNTER — Encounter: Payer: Self-pay | Admitting: Nurse Practitioner

## 2016-12-21 ENCOUNTER — Ambulatory Visit (HOSPITAL_BASED_OUTPATIENT_CLINIC_OR_DEPARTMENT_OTHER): Payer: BLUE CROSS/BLUE SHIELD

## 2016-12-21 ENCOUNTER — Other Ambulatory Visit: Payer: Self-pay | Admitting: Medical Oncology

## 2016-12-21 ENCOUNTER — Encounter: Payer: Self-pay | Admitting: *Deleted

## 2016-12-21 ENCOUNTER — Ambulatory Visit (HOSPITAL_BASED_OUTPATIENT_CLINIC_OR_DEPARTMENT_OTHER): Payer: BLUE CROSS/BLUE SHIELD | Admitting: Nurse Practitioner

## 2016-12-21 ENCOUNTER — Ambulatory Visit
Admission: RE | Admit: 2016-12-21 | Discharge: 2016-12-21 | Disposition: A | Discharge status: Home / Self Care | Payer: BLUE CROSS/BLUE SHIELD | Source: Ambulatory Visit | Attending: Radiation Oncology | Admitting: Radiation Oncology

## 2016-12-21 VITALS — BP 119/82 | HR 77 | Temp 99.3°F | Resp 18 | Ht 74.0 in | Wt 213.2 lb

## 2016-12-21 DIAGNOSIS — J329 Chronic sinusitis, unspecified: Secondary | ICD-10-CM

## 2016-12-21 DIAGNOSIS — J0110 Acute frontal sinusitis, unspecified: Secondary | ICD-10-CM

## 2016-12-21 DIAGNOSIS — C3491 Malignant neoplasm of unspecified part of right bronchus or lung: Secondary | ICD-10-CM

## 2016-12-21 DIAGNOSIS — C7951 Secondary malignant neoplasm of bone: Secondary | ICD-10-CM

## 2016-12-21 DIAGNOSIS — C7949 Secondary malignant neoplasm of other parts of nervous system: Secondary | ICD-10-CM

## 2016-12-21 DIAGNOSIS — Z510 Encounter for antineoplastic radiation therapy: Secondary | ICD-10-CM | POA: Diagnosis not present

## 2016-12-21 DIAGNOSIS — J0100 Acute maxillary sinusitis, unspecified: Secondary | ICD-10-CM

## 2016-12-21 DIAGNOSIS — C7931 Secondary malignant neoplasm of brain: Secondary | ICD-10-CM

## 2016-12-21 DIAGNOSIS — Z5111 Encounter for antineoplastic chemotherapy: Secondary | ICD-10-CM

## 2016-12-21 LAB — CBC WITH DIFFERENTIAL/PLATELET
BASO%: 0.3 % (ref 0.0–2.0)
BASOS ABS: 0 10*3/uL (ref 0.0–0.1)
EOS ABS: 0.1 10*3/uL (ref 0.0–0.5)
EOS%: 3.7 % (ref 0.0–7.0)
HEMATOCRIT: 41.5 % (ref 38.4–49.9)
HEMOGLOBIN: 14.2 g/dL (ref 13.0–17.1)
LYMPH%: 20.5 % (ref 14.0–49.0)
MCH: 30.6 pg (ref 27.2–33.4)
MCHC: 34.2 g/dL (ref 32.0–36.0)
MCV: 89.4 fL (ref 79.3–98.0)
MONO#: 0.3 10*3/uL (ref 0.1–0.9)
MONO%: 9.8 % (ref 0.0–14.0)
NEUT#: 2 10*3/uL (ref 1.5–6.5)
NEUT%: 65.7 % (ref 39.0–75.0)
Platelets: 123 10*3/uL — ABNORMAL LOW (ref 140–400)
RBC: 4.64 10*6/uL (ref 4.20–5.82)
RDW: 13.9 % (ref 11.0–14.6)
WBC: 3 10*3/uL — ABNORMAL LOW (ref 4.0–10.3)
lymph#: 0.6 10*3/uL — ABNORMAL LOW (ref 0.9–3.3)

## 2016-12-21 LAB — COMPREHENSIVE METABOLIC PANEL
ALT: 22 U/L (ref 0–55)
AST: 27 U/L (ref 5–34)
Albumin: 3.9 g/dL (ref 3.5–5.0)
Alkaline Phosphatase: 109 U/L (ref 40–150)
Anion Gap: 10 mEq/L (ref 3–11)
BUN: 15.3 mg/dL (ref 7.0–26.0)
CALCIUM: 10 mg/dL (ref 8.4–10.4)
CO2: 24 mEq/L (ref 22–29)
Chloride: 106 mEq/L (ref 98–109)
Creatinine: 1.3 mg/dL (ref 0.7–1.3)
EGFR: 64 mL/min/{1.73_m2} — AB (ref >90)
Glucose: 94 mg/dl (ref 70–140)
POTASSIUM: 4.4 meq/L (ref 3.5–5.1)
SODIUM: 140 meq/L (ref 136–145)
Total Bilirubin: 0.66 mg/dL (ref 0.20–1.20)
Total Protein: 7 g/dL (ref 6.4–8.3)

## 2016-12-21 MED ORDER — OSIMERTINIB MESYLATE 80 MG PO TABS: 80.0000 mg | ORAL_TABLET | Freq: Every day | ORAL | 0 refills | Status: DC

## 2016-12-21 MED ORDER — AMOXICILLIN-POT CLAVULANATE 875-125 MG PO TABS: 1.0000 | ORAL_TABLET | Freq: Two times a day (BID) | ORAL | 0 refills | Status: DC

## 2016-12-21 MED ORDER — ALBUTEROL SULFATE HFA 108 (90 BASE) MCG/ACT IN AERS: 1.0000 | INHALATION_SPRAY | Freq: Four times a day (QID) | RESPIRATORY_TRACT | 2 refills | Status: DC | PRN

## 2016-12-21 NOTE — Assessment & Plan Note (Signed)
Patient presented to the [facility] today with a 3 to four-day history of increased nasal congestion, intermittent headaches, sinus drainage, and coughing up some yellow/green secretions.  Is also had a low-grade temperature on occasion.  He denies any nausea, vomiting, or diarrhea.  On exam today.  Patient's lungs are clear.  The patient did have an occasional dry cough.  Patient also reports that he has had some wheezing recently as well.  On exam today Patient does have nasal congestion; but no facial tenderness with palpation.  Temp was 99.3 . Patient was also advised he may try Tylenol for low-grade fevers.  He may also try over-the-counter Mucinex as well.  Will  prescribe patient Augmentin and an albuterol inhaler for treatment of sinusitis.  Patient was advised to call/return or go directly to the emergency department for any worsening symptoms whatsoever.

## 2016-12-21 NOTE — Progress Notes (Signed)
Oncology Nurse Navigator Documentation  Oncology Nurse Navigator Flowsheets 12/21/2016  Navigator Location [facility]  Navigator Encounter Type Telephone/patient called and updated me that he was not feeling well with cold like symptoms.  He states no fever just congestion.  I updated Dr. Julien Nordmann.  He would like patient to be seen today with symptom management.  I updated Engineer, agricultural with symptom management.  I completed order for stat labs and urgent LOS to have scheduling call with an appt.   Telephone Incoming Call;Outgoing Call  Treatment Phase Treatment  Barriers/Navigation Needs Coordination of Care;Education  Education Other  Interventions Coordination of Care;Education  Coordination of Care Appts  Education Method Verbal  Acuity Level 2  Time Spent with Patient 15

## 2016-12-21 NOTE — Progress Notes (Signed)
SYMPTOM MANAGEMENT CLINIC    Chief Complaint: Sinus infection  HPI:  Oscar Floyd 51 y.o. male diagnosed with lung cancer with bone metastasis.  Currently undergoing tagrisso oral therapy and radiation treatments    No history exists.    Review of Systems  Constitutional: Positive for chills, fever and malaise/fatigue.  HENT: Positive for congestion.   Respiratory: Positive for cough, sputum production and wheezing.   Neurological: Positive for headaches.  All other systems reviewed and are negative.   Past Medical History:  Diagnosis Date  . Bone metastases ([place]) 08/26/2015  . Encounter for antineoplastic chemotherapy 01/06/2016  . Family history of cancer   . Hypertension   . Lung cancer ([place])    RUL lung with mets to liver, bone and brain    Past Surgical History:  Procedure Laterality Date  . VASECTOMY    . VIDEO BRONCHOSCOPY Bilateral 03/20/2014   Procedure: VIDEO BRONCHOSCOPY WITH FLUORO;  Surgeon: Rigoberto Noel, MD;  Location: [facility];  Service: Cardiopulmonary;  Laterality: Bilateral;    has Non-small cell cancer of right lung ([place]); Cough; Blood pressure elevated; Acid reflux; Numerous sub-centimeter brain metastases; Weight loss, abnormal; Drug-induced diarrhea; Drug-induced skin rash; Paronychia of finger; Bone metastases ([place]); Encounter for antineoplastic chemotherapy; Pharyngitis; Sinus congestion; Family history of cancer; Genetic testing; and Sinusitis on his problem list.    has No Known Allergies.  Allergies as of 12/21/2016   No Known Allergies     Medication List       Accurate as of 12/21/16  5:24 PM. Always use your most recent med list.          albuterol 108 (90 Base) MCG/ACT inhaler Commonly known as:  PROVENTIL HFA;VENTOLIN HFA Inhale 1-2 puffs into the lungs every 6 (six) hours as needed for wheezing or shortness of breath.   ALPRAZolam 1 MG tablet Commonly known as:  XANAX Reported on 02/11/2016   amoxicillin-clavulanate  875-125 MG tablet Commonly known as:  AUGMENTIN Take 1 tablet by mouth 2 (two) times daily.   calcium-vitamin D 250-125 MG-UNIT tablet Commonly known as:  OSCAL Take 1 tablet by mouth daily. Reported on 01/12/2016   diazepam 10 MG tablet Commonly known as:  VALIUM Take 1 tablet (10 mg total) by mouth as directed. 30 min before MRI   loperamide 2 MG capsule Commonly known as:  IMODIUM Take 2 mg by mouth as needed for diarrhea or loose stools.   mirtazapine 30 MG tablet Commonly known as:  REMERON Take 1 tablet (30 mg total) by mouth at bedtime.   naproxen sodium 220 MG tablet Commonly known as:  ANAPROX Take 220 mg by mouth as needed.   NEXIUM 20 MG capsule Generic drug:  esomeprazole Take 20 mg by mouth 2 (two) times daily before a meal.   osimertinib mesylate 80 MG tablet Commonly known as:  TAGRISSO Take 1 tablet (80 mg total) by mouth daily.   sucralfate 1 g tablet Commonly known as:  CARAFATE Take 1 tablet (1 g total) by mouth 4 (four) times daily -  with meals and at bedtime.        PHYSICAL EXAMINATION  Oncology Vitals 12/21/2016 12/17/2016  Height 188 cm -  Weight 96.707 kg 98.521 kg  Weight (lbs) 213 lbs 3 oz 217 lbs 3 oz  BMI (kg/m2) 27.37 kg/m2 27.89 kg/m2  Temp 99.3 -  Pulse 77 66  Resp 18 18  SpO2 100 100  BSA (m2) 2.25 m2 2.27 m2   BP  Readings from Last 2 Encounters:  12/21/16 119/82  12/17/16 126/89    Physical Exam  Constitutional: He is oriented to person, place, and time and well-developed, well-nourished, and in no distress.  HENT:  Head: Normocephalic and atraumatic.  Mouth/Throat: Oropharynx is clear and moist.  Nasal congestion, but no facial tenderness with palpation.  Eyes: Conjunctivae and EOM are normal. Pupils are equal, round, and reactive to light. Right eye exhibits no discharge. Left eye exhibits no discharge. No scleral icterus.  Neck: Normal range of motion. Neck supple. No JVD present. No tracheal deviation present. No  thyromegaly present.  Cardiovascular: Normal rate, regular rhythm, normal heart sounds and intact distal pulses.   Pulmonary/Chest: Effort normal and breath sounds normal. No respiratory distress. He has no wheezes. He has no rales. He exhibits no tenderness.  Occasional dry cough only.  Abdominal: Soft. Bowel sounds are normal. He exhibits no distension and no mass. There is no tenderness. There is no rebound and no guarding.  Musculoskeletal: Normal range of motion. He exhibits no edema, tenderness or deformity.  Lymphadenopathy:    He has no cervical adenopathy.  Neurological: He is alert and oriented to person, place, and time. Gait normal.  Skin: Skin is warm and dry. No rash noted. No erythema. No pallor.  Psychiatric: Affect normal.  Nursing note and vitals reviewed.   LABORATORY DATA:. Appointment on 12/21/2016  Component Date Value Ref Range Status  . WBC 12/21/2016 3.0* 4.0 - 10.3 10e3/uL Final  . NEUT# 12/21/2016 2.0  1.5 - 6.5 10e3/uL Final  . HGB 12/21/2016 14.2  13.0 - 17.1 g/dL Final  . HCT 12/21/2016 41.5  38.4 - 49.9 % Final  . Platelets 12/21/2016 123* 140 - 400 10e3/uL Final  . MCV 12/21/2016 89.4  79.3 - 98.0 fL Final  . MCH 12/21/2016 30.6  27.2 - 33.4 pg Final  . MCHC 12/21/2016 34.2  32.0 - 36.0 g/dL Final  . RBC 12/21/2016 4.64  4.20 - 5.82 10e6/uL Final  . RDW 12/21/2016 13.9  11.0 - 14.6 % Final  . lymph# 12/21/2016 0.6* 0.9 - 3.3 10e3/uL Final  . MONO# 12/21/2016 0.3  0.1 - 0.9 10e3/uL Final  . Eosinophils Absolute 12/21/2016 0.1  0.0 - 0.5 10e3/uL Final  . Basophils Absolute 12/21/2016 0.0  0.0 - 0.1 10e3/uL Final  . NEUT% 12/21/2016 65.7  39.0 - 75.0 % Final  . LYMPH% 12/21/2016 20.5  14.0 - 49.0 % Final  . MONO% 12/21/2016 9.8  0.0 - 14.0 % Final  . EOS% 12/21/2016 3.7  0.0 - 7.0 % Final  . BASO% 12/21/2016 0.3  0.0 - 2.0 % Final  . Sodium 12/21/2016 140  136 - 145 mEq/L Final  . Potassium 12/21/2016 4.4  3.5 - 5.1 mEq/L Final  . Chloride 12/21/2016  106  98 - 109 mEq/L Final  . CO2 12/21/2016 24  22 - 29 mEq/L Final  . Glucose 12/21/2016 94  70 - 140 mg/dl Final  . BUN 12/21/2016 15.3  7.0 - 26.0 mg/dL Final  . Creatinine 12/21/2016 1.3  0.7 - 1.3 mg/dL Final  . Total Bilirubin 12/21/2016 0.66  0.20 - 1.20 mg/dL Final  . Alkaline Phosphatase 12/21/2016 109  40 - 150 U/L Final  . AST 12/21/2016 27  5 - 34 U/L Final  . ALT 12/21/2016 22  0 - 55 U/L Final  . Total Protein 12/21/2016 7.0  6.4 - 8.3 g/dL Final  . Albumin 12/21/2016 3.9  3.5 - 5.0 g/dL Final  .  Calcium 12/21/2016 10.0  8.4 - 10.4 mg/dL Final  . Anion Gap 12/21/2016 10  3 - 11 mEq/L Final  . EGFR 12/21/2016 64* >90 ml/min/1.73 m2 Final    RADIOGRAPHIC STUDIES: No results found.  ASSESSMENT/PLAN:    Sinusitis Patient presented to the [facility] today with a 3 to four-day history of increased nasal congestion, intermittent headaches, sinus drainage, and coughing up some yellow/green secretions.  Is also had a low-grade temperature on occasion.  He denies any nausea, vomiting, or diarrhea.  On exam today.  Patient's lungs are clear.  The patient did have an occasional dry cough.  Patient also reports that he has had some wheezing recently as well.  On exam today Patient does have nasal congestion; but no facial tenderness with palpation.  Temp was 99.3 . Patient was also advised he may try Tylenol for low-grade fevers.  He may also try over-the-counter Mucinex as well.  Will  prescribe patient Augmentin and an albuterol inhaler for treatment of sinusitis.  Patient was advised to call/return or go directly to the emergency department for any worsening symptoms whatsoever.  Non-small cell cancer of right lung [facility]) Patient continues to take Tagrisso oral therapy as directed.  He also continues with his daily radiation treatments to the bone metastasis to the right clavicle region.  He states that he only has 3 more days of radiation after today.  Patient is scheduled  to return on 12/23/2016 for his next Xgeva injection.  He is scheduled to return for labs, visit, and another Xgeva injection on 01/20/2017.   Patient stated understanding of all instructions; and was in agreement with this plan of care. The patient knows to call the clinic with any problems, questions or concerns.   Total time spent with patient was 25 minutes;  with greater than 75 percent of that time spent in face to face counseling regarding patient's symptoms,  and coordination of care and follow up.  Disclaimer:This dictation was prepared with Dragon/digital dictation along with [employer]. Any transcriptional errors that result from this process are unintentional.  Drue Second, NP 12/21/2016

## 2016-12-21 NOTE — Assessment & Plan Note (Signed)
Patient continues to take Tagrisso oral therapy as directed.  He also continues with his daily radiation treatments to the bone metastasis to the right clavicle region.  He states that he only has 3 more days of radiation after today.  Patient is scheduled to return on 12/23/2016 for his next Xgeva injection.  He is scheduled to return for labs, visit, and another Xgeva injection on 01/20/2017.

## 2016-12-22 ENCOUNTER — Ambulatory Visit
Admission: RE | Admit: 2016-12-22 | Discharge: 2016-12-22 | Disposition: A | Discharge status: Home / Self Care | Payer: BLUE CROSS/BLUE SHIELD | Source: Ambulatory Visit | Attending: Radiation Oncology | Admitting: Radiation Oncology

## 2016-12-22 DIAGNOSIS — Z510 Encounter for antineoplastic radiation therapy: Secondary | ICD-10-CM | POA: Diagnosis not present

## 2016-12-23 ENCOUNTER — Ambulatory Visit (HOSPITAL_BASED_OUTPATIENT_CLINIC_OR_DEPARTMENT_OTHER): Payer: BLUE CROSS/BLUE SHIELD

## 2016-12-23 ENCOUNTER — Ambulatory Visit
Admission: RE | Admit: 2016-12-23 | Discharge: 2016-12-23 | Disposition: A | Discharge status: Home / Self Care | Payer: BLUE CROSS/BLUE SHIELD | Source: Ambulatory Visit | Attending: Radiation Oncology | Admitting: Radiation Oncology

## 2016-12-23 DIAGNOSIS — C3400 Malignant neoplasm of unspecified main bronchus: Secondary | ICD-10-CM

## 2016-12-23 DIAGNOSIS — C7951 Secondary malignant neoplasm of bone: Secondary | ICD-10-CM

## 2016-12-23 DIAGNOSIS — Z510 Encounter for antineoplastic radiation therapy: Secondary | ICD-10-CM | POA: Diagnosis not present

## 2016-12-23 MED ORDER — DENOSUMAB 120 MG/1.7ML ~~LOC~~ SOLN
120.0000 mg | Freq: Once | SUBCUTANEOUS | Status: AC
  Administered 2016-12-23: 120 mg via SUBCUTANEOUS
  Filled 2016-12-23: qty 1.7

## 2016-12-23 NOTE — Patient Instructions (Signed)

## 2016-12-24 ENCOUNTER — Ambulatory Visit
Admission: RE | Admit: 2016-12-24 | Discharge: 2016-12-24 | Disposition: A | Discharge status: Home / Self Care | Payer: BLUE CROSS/BLUE SHIELD | Source: Ambulatory Visit | Attending: Radiation Oncology | Admitting: Radiation Oncology

## 2016-12-24 ENCOUNTER — Encounter: Payer: Self-pay | Admitting: Radiation Oncology

## 2016-12-24 DIAGNOSIS — Z510 Encounter for antineoplastic radiation therapy: Secondary | ICD-10-CM | POA: Diagnosis not present

## 2016-12-27 NOTE — Progress Notes (Signed)
  Radiation Oncology         [phone]) [phone] ________________________________  Name: Oscar Floyd MRN: [id number]  Date: 12/24/2016  DOB: [birth year]  End of Treatment Note  Diagnosis:   51 yo man with EGFR positive [name] of the right upper lobe with painful right clavicle metastasis     Indication for treatment:  Palliation       Radiation treatment dates:   12/13/2016 to 12/24/2016  Site/dose:   The Right clavicle was treated to 30 Gy in 10 fractions at 3 Gy per fraction.  Beams/energy:   Isodose Plan // 6X  Narrative: The patient tolerated radiation treatment relatively well. He reports supraclavicular pain has decreased with treatment, stating the area is no longer painful with palpation. He also reports fatigue.   Plan: The patient has completed radiation treatment. The patient will return to radiation oncology clinic for routine followup in one month. I advised him to call or return sooner if he has any questions or concerns related to his recovery or treatment. ________________________________  Sheral Apley. Tammi Klippel, M.D.  This document serves as a record of services personally performed by Tyler Pita, MD. It was created on his behalf by Arlyce Harman, a trained medical scribe. The creation of this record is based on the scribe's personal observations and the provider's statements to them. This document has been checked and approved by the attending provider.

## 2017-01-20 ENCOUNTER — Ambulatory Visit (HOSPITAL_BASED_OUTPATIENT_CLINIC_OR_DEPARTMENT_OTHER): Payer: BLUE CROSS/BLUE SHIELD

## 2017-01-20 ENCOUNTER — Telehealth: Payer: Self-pay | Admitting: Internal Medicine

## 2017-01-20 ENCOUNTER — Ambulatory Visit (HOSPITAL_BASED_OUTPATIENT_CLINIC_OR_DEPARTMENT_OTHER): Payer: BLUE CROSS/BLUE SHIELD | Admitting: Internal Medicine

## 2017-01-20 ENCOUNTER — Ambulatory Visit: Payer: BLUE CROSS/BLUE SHIELD

## 2017-01-20 ENCOUNTER — Other Ambulatory Visit (HOSPITAL_BASED_OUTPATIENT_CLINIC_OR_DEPARTMENT_OTHER): Payer: BLUE CROSS/BLUE SHIELD

## 2017-01-20 ENCOUNTER — Encounter: Payer: Self-pay | Admitting: Internal Medicine

## 2017-01-20 VITALS — BP 119/83 | HR 70 | Temp 98.7°F | Resp 20 | Ht 74.0 in | Wt 210.8 lb

## 2017-01-20 DIAGNOSIS — C3491 Malignant neoplasm of unspecified part of right bronchus or lung: Secondary | ICD-10-CM

## 2017-01-20 DIAGNOSIS — C7949 Secondary malignant neoplasm of other parts of nervous system: Secondary | ICD-10-CM

## 2017-01-20 DIAGNOSIS — Z5111 Encounter for antineoplastic chemotherapy: Secondary | ICD-10-CM

## 2017-01-20 DIAGNOSIS — C7951 Secondary malignant neoplasm of bone: Secondary | ICD-10-CM

## 2017-01-20 DIAGNOSIS — C3400 Malignant neoplasm of unspecified main bronchus: Secondary | ICD-10-CM

## 2017-01-20 DIAGNOSIS — C7931 Secondary malignant neoplasm of brain: Secondary | ICD-10-CM | POA: Diagnosis not present

## 2017-01-20 DIAGNOSIS — C787 Secondary malignant neoplasm of liver and intrahepatic bile duct: Secondary | ICD-10-CM | POA: Diagnosis not present

## 2017-01-20 DIAGNOSIS — C3411 Malignant neoplasm of upper lobe, right bronchus or lung: Secondary | ICD-10-CM

## 2017-01-20 LAB — CBC WITH DIFFERENTIAL/PLATELET
BASO%: 0.3 % (ref 0.0–2.0)
Basophils Absolute: 0 10*3/uL (ref 0.0–0.1)
EOS%: 2.6 % (ref 0.0–7.0)
Eosinophils Absolute: 0.1 10*3/uL (ref 0.0–0.5)
HCT: 39.6 % (ref 38.4–49.9)
HGB: 13.1 g/dL (ref 13.0–17.1)
LYMPH%: 27.6 % (ref 14.0–49.0)
MCH: 29.7 pg (ref 27.2–33.4)
MCHC: 33.1 g/dL (ref 32.0–36.0)
MCV: 89.8 fL (ref 79.3–98.0)
MONO#: 0.4 10*3/uL (ref 0.1–0.9)
MONO%: 13.2 % (ref 0.0–14.0)
NEUT%: 56.3 % (ref 39.0–75.0)
NEUTROS ABS: 1.7 10*3/uL (ref 1.5–6.5)
Platelets: 136 10*3/uL — ABNORMAL LOW (ref 140–400)
RBC: 4.41 10*6/uL (ref 4.20–5.82)
RDW: 13.9 % (ref 11.0–14.6)
WBC: 3 10*3/uL — AB (ref 4.0–10.3)
lymph#: 0.8 10*3/uL — ABNORMAL LOW (ref 0.9–3.3)

## 2017-01-20 LAB — COMPREHENSIVE METABOLIC PANEL
ALT: 19 U/L (ref 0–55)
AST: 28 U/L (ref 5–34)
Albumin: 3.9 g/dL (ref 3.5–5.0)
Alkaline Phosphatase: 88 U/L (ref 40–150)
Anion Gap: 7 mEq/L (ref 3–11)
BILIRUBIN TOTAL: 0.64 mg/dL (ref 0.20–1.20)
BUN: 11 mg/dL (ref 7.0–26.0)
CO2: 25 meq/L (ref 22–29)
CREATININE: 1.2 mg/dL (ref 0.7–1.3)
Calcium: 9 mg/dL (ref 8.4–10.4)
Chloride: 110 mEq/L — ABNORMAL HIGH (ref 98–109)
EGFR: 72 mL/min/{1.73_m2} — ABNORMAL LOW (ref >90)
GLUCOSE: 72 mg/dL (ref 70–140)
Potassium: 4.1 mEq/L (ref 3.5–5.1)
SODIUM: 141 meq/L (ref 136–145)
TOTAL PROTEIN: 6.6 g/dL (ref 6.4–8.3)

## 2017-01-20 MED ORDER — DENOSUMAB 120 MG/1.7ML ~~LOC~~ SOLN
120.0000 mg | Freq: Once | SUBCUTANEOUS | Status: AC
  Administered 2017-01-20: 120 mg via SUBCUTANEOUS
  Filled 2017-01-20: qty 1.7

## 2017-01-20 NOTE — Progress Notes (Signed)
[facility] [phone]   [phone]  OFFICE PROGRESS NOTE  [name], MD [place] [address]  DIAGNOSIS: stage IV (T2a, N0, M1b) non-small cell lung cancer consistent with poorly differentiated adenocarcinoma with positive EGFR mutation with deletion in exon 19, diagnosed in July of 2015 and presented with right upper lobe lung mass in addition to extensive liver, brain and bone metastases.  He had disease progression and development of EGFR T790M resistant mutation in April 2017.  PRIOR THERAPY: 1) Status post whole brain irradiation under the care Dr. Tammi Klippel completed 04/12/2014. 2) Gilotrif 40 mg po daily - therapy beginning 04/03/2014. Status post approximately 20 months of therapy discontinued secondary to disease progression and development of EGFR T790M resistant mutation.  3) palliative radiotherapy to the lytic lesion in the right clavicle.  CURRENT THERAPY: 1) Tagrisso 80 mg by mouth daily status post 12 months of treatment. First dose was given on 01/12/2016. 2) Xgeva 120 g subcutaneously on monthly basis.  INTERVAL HISTORY: Oscar Floyd 51 y.o. male returns to the clinic today for follow-up visit. The patient is feeling fine today with no specific complaints. He continues to tolerate his treatment with Tagrisso fairly well. He denied having any chest pain, shortness breath, cough or hemoptysis. He denied having any fever or chills. He has no nausea, vomiting, diarrhea or constipation. He denied having any significant skin rash. He is here today for evaluation and repeat blood work as well as IT consultant injection.  MEDICAL HISTORY: Past Medical History:  Diagnosis Date  . Bone metastases ([place]) 08/26/2015  . Encounter for antineoplastic chemotherapy 01/06/2016  . Family history of cancer   . Hypertension   . Lung cancer ([place])    RUL lung with mets to liver, bone and brain    ALLERGIES:  has No Known  Allergies.  MEDICATIONS:  Current Outpatient Prescriptions  Medication Sig Dispense Refill  . calcium-vitamin D (OSCAL) 250-125 MG-UNIT per tablet Take 1 tablet by mouth daily. Reported on 01/12/2016    . esomeprazole (NEXIUM) 20 MG capsule Take 20 mg by mouth 2 (two) times daily before a meal.     . naproxen sodium (ANAPROX) 220 MG tablet Take 220 mg by mouth as needed.    Marland Kitchen osimertinib mesylate (TAGRISSO) 80 MG tablet Take 1 tablet (80 mg total) by mouth daily. 30 tablet 0  . ALPRAZolam (XANAX) 1 MG tablet Reported on 02/11/2016    . diazepam (VALIUM) 10 MG tablet Take 1 tablet (10 mg total) by mouth as directed. 30 min before MRI (Patient not taking: Reported on 09/16/2016) 15 tablet 0  . loperamide (IMODIUM) 2 MG capsule Take 2 mg by mouth as needed for diarrhea or loose stools.    . mirtazapine (REMERON) 30 MG tablet Take 1 tablet (30 mg total) by mouth at bedtime. (Patient not taking: Reported on 12/16/2016) 30 tablet 2   No current facility-administered medications for this visit.     SURGICAL HISTORY:  Past Surgical History:  Procedure Laterality Date  . VASECTOMY    . VIDEO BRONCHOSCOPY Bilateral 03/20/2014   Procedure: VIDEO BRONCHOSCOPY WITH FLUORO;  Surgeon: Rigoberto Noel, MD;  Location: [facility];  Service: Cardiopulmonary;  Laterality: Bilateral;    REVIEW OF SYSTEMS:  A comprehensive review of systems was negative.   PHYSICAL EXAMINATION: General appearance: alert, cooperative, appears stated age and no distress Head: Normocephalic, without obvious abnormality, atraumatic Neck: no adenopathy, no JVD, supple, symmetrical,  trachea midline and thyroid not enlarged, symmetric, no tenderness/mass/nodules Lymph nodes: Cervical, supraclavicular, and axillary nodes normal. Resp: clear to auscultation bilaterally Back: symmetric, no curvature. ROM normal. No CVA tenderness. Cardio: regular rate and rhythm, S1, S2 normal, no murmur, click, rub or gallop GI: soft, non-tender; bowel  sounds normal; no masses,  no organomegaly Extremities: extremities normal, atraumatic, no cyanosis or edema  ECOG PERFORMANCE STATUS: 0 - Asymptomatic  Blood pressure 119/83, pulse 70, temperature 98.7 F (37.1 C), temperature source Oral, resp. rate 20, height 6' 2" (1.88 m), weight 210 lb 12.8 oz (95.6 kg), SpO2 100 %.  LABORATORY DATA: Lab Results  Component Value Date   WBC 3.0 (L) 01/20/2017   HGB 13.1 01/20/2017   HCT 39.6 01/20/2017   MCV 89.8 01/20/2017   PLT 136 (L) 01/20/2017      Chemistry      Component Value Date/Time   NA 140 12/21/2016 1343   K 4.4 12/21/2016 1343   CO2 24 12/21/2016 1343   BUN 15.3 12/21/2016 1343   CREATININE 1.3 12/21/2016 1343      Component Value Date/Time   CALCIUM 10.0 12/21/2016 1343   ALKPHOS 109 12/21/2016 1343   AST 27 12/21/2016 1343   ALT 22 12/21/2016 1343   BILITOT 0.66 12/21/2016 1343       RADIOGRAPHIC STUDIES: No results found.  ASSESSMENT AND PLAN: This is a very pleasant 51 years old white male with metastatic non-small cell lung cancer, adenocarcinoma with positive EGFR mutation with deletion in exon 19. He is status post treatment with Gilotrif for 20 months discontinued secondary to disease progression and development of EGFR T790M resistant mutation. The patient was started on treatment with Tagrisso 80 mg by mouth daily status post 12 months of treatment and has been tolerating this treatment fairly well. I recommended for the patient to continue his current treatment with Tagrisso the same dose. I would see him back for follow-up visit in one month for evaluation after repeating CT scan of the chest, abdomen and pelvis for restaging of his disease. For the metastatic bone disease, he will continue his treatment with Xgeva. The patient was advised to call immediately if he has any concerning symptoms in the interval. The patient voices understanding of current disease status and treatment options and is in  agreement with the current care plan. All questions were answered. The patient knows to call the clinic with any problems, questions or concerns. We can certainly see the patient much sooner if necessary. I spent 10 minutes counseling the patient face to face. The total time spent in the appointment was 15 minutes.  Disclaimer: This note was dictated with voice recognition software. Similar sounding words can inadvertently be transcribed and may not be corrected upon review.       

## 2017-01-20 NOTE — Patient Instructions (Signed)
Denosumab injection What is this medicine? DENOSUMAB (den oh sue mab) slows bone breakdown. Prolia is used to treat osteoporosis in women after menopause and in men. Xgeva is used to prevent bone fractures and other bone problems caused by cancer bone metastases. Xgeva is also used to treat giant cell tumor of the bone. This medicine may be used for other purposes; ask your health care provider or pharmacist if you have questions. COMMON BRAND NAME(S): Prolia, XGEVA What should I tell my health care provider before I take this medicine? They need to know if you have any of these conditions: -dental disease -eczema -infection or history of infections -kidney disease or on dialysis -low blood calcium or vitamin D -malabsorption syndrome -scheduled to have surgery or tooth extraction -taking medicine that contains denosumab -thyroid or parathyroid disease -an unusual reaction to denosumab, other medicines, foods, dyes, or preservatives -pregnant or trying to get pregnant -breast-feeding How should I use this medicine? This medicine is for injection under the skin. It is given by a health care professional in a hospital or clinic setting. If you are getting Prolia, a special MedGuide will be given to you by the pharmacist with each prescription and refill. Be sure to read this information carefully each time. For Prolia, talk to your pediatrician regarding the use of this medicine in children. Special care may be needed. For Xgeva, talk to your pediatrician regarding the use of this medicine in children. While this drug may be prescribed for children as young as 13 years for selected conditions, precautions do apply. Overdosage: If you think you've taken too much of this medicine contact a poison control center or emergency room at once. Overdosage: If you think you have taken too much of this medicine contact a poison control center or emergency room at once. NOTE: This medicine is only for  you. Do not share this medicine with others. What if I miss a dose? It is important not to miss your dose. Call your doctor or health care professional if you are unable to keep an appointment. What may interact with this medicine? Do not take this medicine with any of the following medications: -other medicines containing denosumab This medicine may also interact with the following medications: -medicines that suppress the immune system -medicines that treat cancer -steroid medicines like prednisone or cortisone This list may not describe all possible interactions. Give your health care provider a list of all the medicines, herbs, non-prescription drugs, or dietary supplements you use. Also tell them if you smoke, drink alcohol, or use illegal drugs. Some items may interact with your medicine. What should I watch for while using this medicine? Visit your doctor or health care professional for regular checks on your progress. Your doctor or health care professional may order blood tests and other tests to see how you are doing. Call your doctor or health care professional if you get a cold or other infection while receiving this medicine. Do not treat yourself. This medicine may decrease your body's ability to fight infection. You should make sure you get enough calcium and vitamin D while you are taking this medicine, unless your doctor tells you not to. Discuss the foods you eat and the vitamins you take with your health care professional. See your dentist regularly. Brush and floss your teeth as directed. Before you have any dental work done, tell your dentist you are receiving this medicine. Do not become pregnant while taking this medicine or for 5 months after stopping   it. Women should inform their doctor if they wish to become pregnant or think they might be pregnant. There is a potential for serious side effects to an unborn child. Talk to your health care professional or pharmacist for more  information. What side effects may I notice from receiving this medicine? Side effects that you should report to your doctor or health care professional as soon as possible: -allergic reactions like skin rash, itching or hives, swelling of the face, lips, or tongue -breathing problems -chest pain -fast, irregular heartbeat -feeling faint or lightheaded, falls -fever, chills, or any other sign of infection -muscle spasms, tightening, or twitches -numbness or tingling -skin blisters or bumps, or is dry, peels, or red -slow healing or unexplained pain in the mouth or jaw -unusual bleeding or bruising Side effects that usually do not require medical attention (Report these to your doctor or health care professional if they continue or are bothersome.): -muscle pain -stomach upset, gas This list may not describe all possible side effects. Call your doctor for medical advice about side effects. You may report side effects to FDA at 1-800-FDA-1088. Where should I keep my medicine? This medicine is only given in a clinic, doctor's office, or other health care setting and will not be stored at home. NOTE: This sheet is a summary. It may not cover all possible information. If you have questions about this medicine, talk to your doctor, pharmacist, or health care provider.  2015, Elsevier/Gold Standard. (2012-02-28 12:37:47)  

## 2017-01-20 NOTE — Telephone Encounter (Signed)
Gave patient AVS and calender per 5/10 los. - [facility] to contact patient with Ct schedule.

## 2017-02-10 ENCOUNTER — Encounter: Payer: Self-pay | Admitting: *Deleted

## 2017-02-10 NOTE — Progress Notes (Signed)
Oncology Nurse Navigator Documentation  Oncology Nurse Navigator Flowsheets 02/10/2017  Navigator Location [facility]  Navigator Encounter Type Letter/Fax/Email/I received an email from Mr. Oscar Floyd today. He needed scan to be scheduled and financial assistance with [employer].  I updated Oscar Floyd to help him with Oscar Floyd financial assistance.  I also asked authorization coordinator if scans have been authorized.  She was able to expidite the authorization. I called central scheduling to get an appt.  I then emailed Oscar Floyd back. I updated him on schedule of scans and all appts here at [facility].  I also gave him pre-procedure instructions.    Treatment Phase Treatment  Barriers/Navigation Needs Coordination of Care  Interventions Coordination of Care  Coordination of Care Appts;Radiology;Other  Acuity Level 2  Acuity Level 2 Assistance expediting appointments  Time Spent with Patient 45

## 2017-02-14 ENCOUNTER — Other Ambulatory Visit: Payer: Self-pay | Admitting: Medical Oncology

## 2017-02-14 DIAGNOSIS — C3491 Malignant neoplasm of unspecified part of right bronchus or lung: Secondary | ICD-10-CM

## 2017-02-17 ENCOUNTER — Ambulatory Visit (HOSPITAL_BASED_OUTPATIENT_CLINIC_OR_DEPARTMENT_OTHER): Payer: BLUE CROSS/BLUE SHIELD

## 2017-02-17 ENCOUNTER — Other Ambulatory Visit (HOSPITAL_BASED_OUTPATIENT_CLINIC_OR_DEPARTMENT_OTHER): Payer: BLUE CROSS/BLUE SHIELD

## 2017-02-17 VITALS — BP 124/82 | HR 71 | Temp 97.5°F | Resp 18

## 2017-02-17 DIAGNOSIS — C3400 Malignant neoplasm of unspecified main bronchus: Secondary | ICD-10-CM

## 2017-02-17 DIAGNOSIS — C7951 Secondary malignant neoplasm of bone: Secondary | ICD-10-CM

## 2017-02-17 DIAGNOSIS — C3491 Malignant neoplasm of unspecified part of right bronchus or lung: Secondary | ICD-10-CM | POA: Diagnosis not present

## 2017-02-17 LAB — CBC WITH DIFFERENTIAL/PLATELET
BASO%: 0.6 % (ref 0.0–2.0)
Basophils Absolute: 0 10*3/uL (ref 0.0–0.1)
EOS%: 2.8 % (ref 0.0–7.0)
Eosinophils Absolute: 0.1 10*3/uL (ref 0.0–0.5)
HCT: 42.6 % (ref 38.4–49.9)
HGB: 14.3 g/dL (ref 13.0–17.1)
LYMPH%: 27.3 % (ref 14.0–49.0)
MCH: 30 pg (ref 27.2–33.4)
MCHC: 33.7 g/dL (ref 32.0–36.0)
MCV: 89.2 fL (ref 79.3–98.0)
MONO#: 0.4 10*3/uL (ref 0.1–0.9)
MONO%: 10.1 % (ref 0.0–14.0)
NEUT%: 59.2 % (ref 39.0–75.0)
NEUTROS ABS: 2.1 10*3/uL (ref 1.5–6.5)
PLATELETS: 142 10*3/uL (ref 140–400)
RBC: 4.78 10*6/uL (ref 4.20–5.82)
RDW: 13.9 % (ref 11.0–14.6)
WBC: 3.5 10*3/uL — AB (ref 4.0–10.3)
lymph#: 1 10*3/uL (ref 0.9–3.3)

## 2017-02-17 LAB — COMPREHENSIVE METABOLIC PANEL
ALK PHOS: 80 U/L (ref 40–150)
ALT: 19 U/L (ref 0–55)
ANION GAP: 8 meq/L (ref 3–11)
AST: 28 U/L (ref 5–34)
Albumin: 4.1 g/dL (ref 3.5–5.0)
BILIRUBIN TOTAL: 0.8 mg/dL (ref 0.20–1.20)
BUN: 11.7 mg/dL (ref 7.0–26.0)
CO2: 25 meq/L (ref 22–29)
Calcium: 10.2 mg/dL (ref 8.4–10.4)
Chloride: 109 mEq/L (ref 98–109)
Creatinine: 1.3 mg/dL (ref 0.7–1.3)
EGFR: 64 mL/min/{1.73_m2} — AB (ref >90)
Glucose: 88 mg/dl (ref 70–140)
POTASSIUM: 4.7 meq/L (ref 3.5–5.1)
Sodium: 142 mEq/L (ref 136–145)
TOTAL PROTEIN: 7 g/dL (ref 6.4–8.3)

## 2017-02-17 MED ORDER — DENOSUMAB 120 MG/1.7ML ~~LOC~~ SOLN
120.0000 mg | Freq: Once | SUBCUTANEOUS | Status: AC
  Administered 2017-02-17: 120 mg via SUBCUTANEOUS
  Filled 2017-02-17: qty 1.7

## 2017-02-17 NOTE — Patient Instructions (Signed)
Denosumab injection What is this medicine? DENOSUMAB (den oh sue mab) slows bone breakdown. Prolia is used to treat osteoporosis in women after menopause and in men. Xgeva is used to prevent bone fractures and other bone problems caused by cancer bone metastases. Xgeva is also used to treat giant cell tumor of the bone. This medicine may be used for other purposes; ask your health care provider or pharmacist if you have questions. COMMON BRAND NAME(S): Prolia, XGEVA What should I tell my health care provider before I take this medicine? They need to know if you have any of these conditions: -dental disease -eczema -infection or history of infections -kidney disease or on dialysis -low blood calcium or vitamin D -malabsorption syndrome -scheduled to have surgery or tooth extraction -taking medicine that contains denosumab -thyroid or parathyroid disease -an unusual reaction to denosumab, other medicines, foods, dyes, or preservatives -pregnant or trying to get pregnant -breast-feeding How should I use this medicine? This medicine is for injection under the skin. It is given by a health care professional in a hospital or clinic setting. If you are getting Prolia, a special MedGuide will be given to you by the pharmacist with each prescription and refill. Be sure to read this information carefully each time. For Prolia, talk to your pediatrician regarding the use of this medicine in children. Special care may be needed. For Xgeva, talk to your pediatrician regarding the use of this medicine in children. While this drug may be prescribed for children as young as 13 years for selected conditions, precautions do apply. Overdosage: If you think you've taken too much of this medicine contact a poison control center or emergency room at once. Overdosage: If you think you have taken too much of this medicine contact a poison control center or emergency room at once. NOTE: This medicine is only for  you. Do not share this medicine with others. What if I miss a dose? It is important not to miss your dose. Call your doctor or health care professional if you are unable to keep an appointment. What may interact with this medicine? Do not take this medicine with any of the following medications: -other medicines containing denosumab This medicine may also interact with the following medications: -medicines that suppress the immune system -medicines that treat cancer -steroid medicines like prednisone or cortisone This list may not describe all possible interactions. Give your health care provider a list of all the medicines, herbs, non-prescription drugs, or dietary supplements you use. Also tell them if you smoke, drink alcohol, or use illegal drugs. Some items may interact with your medicine. What should I watch for while using this medicine? Visit your doctor or health care professional for regular checks on your progress. Your doctor or health care professional may order blood tests and other tests to see how you are doing. Call your doctor or health care professional if you get a cold or other infection while receiving this medicine. Do not treat yourself. This medicine may decrease your body's ability to fight infection. You should make sure you get enough calcium and vitamin D while you are taking this medicine, unless your doctor tells you not to. Discuss the foods you eat and the vitamins you take with your health care professional. See your dentist regularly. Brush and floss your teeth as directed. Before you have any dental work done, tell your dentist you are receiving this medicine. Do not become pregnant while taking this medicine or for 5 months after stopping   it. Women should inform their doctor if they wish to become pregnant or think they might be pregnant. There is a potential for serious side effects to an unborn child. Talk to your health care professional or pharmacist for more  information. What side effects may I notice from receiving this medicine? Side effects that you should report to your doctor or health care professional as soon as possible: -allergic reactions like skin rash, itching or hives, swelling of the face, lips, or tongue -breathing problems -chest pain -fast, irregular heartbeat -feeling faint or lightheaded, falls -fever, chills, or any other sign of infection -muscle spasms, tightening, or twitches -numbness or tingling -skin blisters or bumps, or is dry, peels, or red -slow healing or unexplained pain in the mouth or jaw -unusual bleeding or bruising Side effects that usually do not require medical attention (Report these to your doctor or health care professional if they continue or are bothersome.): -muscle pain -stomach upset, gas This list may not describe all possible side effects. Call your doctor for medical advice about side effects. You may report side effects to FDA at 1-800-FDA-1088. Where should I keep my medicine? This medicine is only given in a clinic, doctor's office, or other health care setting and will not be stored at home. NOTE: This sheet is a summary. It may not cover all possible information. If you have questions about this medicine, talk to your doctor, pharmacist, or health care provider.  2015, Elsevier/Gold Standard. (2012-02-28 12:37:47)  

## 2017-02-21 ENCOUNTER — Encounter (HOSPITAL_COMMUNITY): Payer: Self-pay

## 2017-02-21 ENCOUNTER — Other Ambulatory Visit: Payer: BLUE CROSS/BLUE SHIELD

## 2017-02-21 ENCOUNTER — Ambulatory Visit (HOSPITAL_COMMUNITY)
Admission: RE | Admit: 2017-02-21 | Discharge: 2017-02-21 | Disposition: A | Discharge status: Home / Self Care | Payer: BLUE CROSS/BLUE SHIELD | Source: Ambulatory Visit | Attending: Internal Medicine | Admitting: Internal Medicine

## 2017-02-21 ENCOUNTER — Ambulatory Visit (HOSPITAL_COMMUNITY): Payer: BLUE CROSS/BLUE SHIELD

## 2017-02-21 DIAGNOSIS — C7949 Secondary malignant neoplasm of other parts of nervous system: Secondary | ICD-10-CM | POA: Diagnosis not present

## 2017-02-21 DIAGNOSIS — C7931 Secondary malignant neoplasm of brain: Secondary | ICD-10-CM | POA: Insufficient documentation

## 2017-02-21 DIAGNOSIS — C7951 Secondary malignant neoplasm of bone: Secondary | ICD-10-CM | POA: Insufficient documentation

## 2017-02-21 DIAGNOSIS — C3491 Malignant neoplasm of unspecified part of right bronchus or lung: Secondary | ICD-10-CM | POA: Insufficient documentation

## 2017-02-21 DIAGNOSIS — Z9221 Personal history of antineoplastic chemotherapy: Secondary | ICD-10-CM | POA: Insufficient documentation

## 2017-02-21 DIAGNOSIS — Z5111 Encounter for antineoplastic chemotherapy: Secondary | ICD-10-CM

## 2017-02-21 MED ORDER — IOPAMIDOL (ISOVUE-300) INJECTION 61%
100.0000 mL | Freq: Once | INTRAVENOUS | Status: AC | PRN
  Administered 2017-02-21: 100 mL via INTRAVENOUS

## 2017-02-21 MED ORDER — IOPAMIDOL (ISOVUE-300) INJECTION 61%
INTRAVENOUS | Status: AC
  Filled 2017-02-21: qty 100

## 2017-02-21 MED ORDER — IOPAMIDOL (ISOVUE-300) INJECTION 61%
30.0000 mL | Freq: Once | INTRAVENOUS | Status: AC | PRN
  Administered 2017-02-21: 30 mL via ORAL

## 2017-02-21 MED ORDER — IOPAMIDOL (ISOVUE-300) INJECTION 61%
INTRAVENOUS | Status: AC
  Administered 2017-02-21: 30 mL via ORAL
  Filled 2017-02-21: qty 30

## 2017-02-22 ENCOUNTER — Telehealth: Payer: Self-pay | Admitting: Internal Medicine

## 2017-02-22 ENCOUNTER — Encounter: Payer: Self-pay | Admitting: Internal Medicine

## 2017-02-22 ENCOUNTER — Ambulatory Visit (HOSPITAL_BASED_OUTPATIENT_CLINIC_OR_DEPARTMENT_OTHER): Payer: BLUE CROSS/BLUE SHIELD | Admitting: Internal Medicine

## 2017-02-22 VITALS — BP 139/81 | HR 57 | Temp 98.6°F | Resp 18 | Ht 74.0 in | Wt 208.8 lb

## 2017-02-22 DIAGNOSIS — C787 Secondary malignant neoplasm of liver and intrahepatic bile duct: Secondary | ICD-10-CM

## 2017-02-22 DIAGNOSIS — C3411 Malignant neoplasm of upper lobe, right bronchus or lung: Secondary | ICD-10-CM

## 2017-02-22 DIAGNOSIS — Z923 Personal history of irradiation: Secondary | ICD-10-CM

## 2017-02-22 DIAGNOSIS — C7951 Secondary malignant neoplasm of bone: Secondary | ICD-10-CM

## 2017-02-22 DIAGNOSIS — C7931 Secondary malignant neoplasm of brain: Secondary | ICD-10-CM

## 2017-02-22 DIAGNOSIS — Z5111 Encounter for antineoplastic chemotherapy: Secondary | ICD-10-CM

## 2017-02-22 DIAGNOSIS — C3491 Malignant neoplasm of unspecified part of right bronchus or lung: Secondary | ICD-10-CM

## 2017-02-22 DIAGNOSIS — C7949 Secondary malignant neoplasm of other parts of nervous system: Secondary | ICD-10-CM

## 2017-02-22 NOTE — Progress Notes (Signed)
[place] Telephone:([phone]   Fax:([phone]  OFFICE PROGRESS NOTE  [name], MD [place] [address]  DIAGNOSIS: stage IV (T2a, N0, M1b) non-small cell lung cancer consistent with poorly differentiated adenocarcinoma with positive EGFR mutation with deletion in exon 19, diagnosed in July of 2015 and presented with right upper lobe lung mass in addition to extensive liver, brain and bone metastases.  He had disease progression and development of EGFR T790M resistant mutation in April 2017.  PRIOR THERAPY: 1) Status post whole brain irradiation under the care Dr. Tammi Klippel completed 04/12/2014. 2) Gilotrif 40 mg po daily - therapy beginning 04/03/2014. Status post approximately 20 months of therapy discontinued secondary to disease progression and development of EGFR T790M resistant mutation.  3) palliative radiotherapy to the lytic lesion in the right clavicle.  CURRENT THERAPY: 1) Tagrisso 80 mg by mouth daily status post 12 months of treatment. First dose was given on 01/12/2016. 2) Xgeva 120 g subcutaneously on monthly basis.  INTERVAL HISTORY: Oscar Floyd 51 y.o. male returns to the clinic today for follow-up visit. The patient is feeling fine today with no specific complaints. He continues to be very active and he plays golf at regular basis. He has no limitations. He denied having any chest pain, shortness breath, cough or hemoptysis. He denied having any significant skin rash or diarrhea. He has no nausea, vomiting, or constipation. He denied having any recent weight loss or night sweats. He has no headache or visual changes. He had repeat CT scan of the chest, abdomen and pelvis performed recently and he is here for evaluation and discussion of his scan results.   MEDICAL HISTORY: Past Medical History:  Diagnosis Date  . Bone metastases ([place]) 08/26/2015  . Encounter for antineoplastic chemotherapy 01/06/2016  . Family  history of cancer   . Hypertension   . Lung cancer ([place])    RUL lung with mets to liver, bone and brain    ALLERGIES:  has No Known Allergies.  MEDICATIONS:  Current Outpatient Prescriptions  Medication Sig Dispense Refill  . calcium-vitamin D (OSCAL) 250-125 MG-UNIT per tablet Take 1 tablet by mouth daily. Reported on 01/12/2016    . esomeprazole (NEXIUM) 20 MG capsule Take 20 mg by mouth 2 (two) times daily before a meal.     . naproxen sodium (ANAPROX) 220 MG tablet Take 220 mg by mouth as needed.    Marland Kitchen osimertinib mesylate (TAGRISSO) 80 MG tablet Take 1 tablet (80 mg total) by mouth daily. 30 tablet 0  . ALPRAZolam (XANAX) 1 MG tablet Reported on 02/11/2016    . diazepam (VALIUM) 10 MG tablet Take 1 tablet (10 mg total) by mouth as directed. 30 min before MRI (Patient not taking: Reported on 09/16/2016) 15 tablet 0  . loperamide (IMODIUM) 2 MG capsule Take 2 mg by mouth as needed for diarrhea or loose stools.    . mirtazapine (REMERON) 30 MG tablet Take 1 tablet (30 mg total) by mouth at bedtime. (Patient not taking: Reported on 12/16/2016) 30 tablet 2   No current facility-administered medications for this visit.     SURGICAL HISTORY:  Past Surgical History:  Procedure Laterality Date  . VASECTOMY    . VIDEO BRONCHOSCOPY Bilateral 03/20/2014   Procedure: VIDEO BRONCHOSCOPY WITH FLUORO;  Surgeon: Rigoberto Noel, MD;  Location: [facility];  Service: Cardiopulmonary;  Laterality: Bilateral;    REVIEW OF SYSTEMS:  Constitutional: negative Eyes: negative Ears,  nose, mouth, throat, and face: negative Respiratory: negative Cardiovascular: negative Gastrointestinal: negative Genitourinary:negative Integument/breast: negative Hematologic/lymphatic: negative Musculoskeletal:negative Neurological: negative Behavioral/Psych: negative Endocrine: negative Allergic/Immunologic: negative   PHYSICAL EXAMINATION: General appearance: alert, cooperative, appears stated age and no  distress Head: Normocephalic, without obvious abnormality, atraumatic Neck: no adenopathy, no JVD, supple, symmetrical, trachea midline and thyroid not enlarged, symmetric, no tenderness/mass/nodules Lymph nodes: Cervical, supraclavicular, and axillary nodes normal. Resp: clear to auscultation bilaterally Back: symmetric, no curvature. ROM normal. No CVA tenderness. Cardio: regular rate and rhythm, S1, S2 normal, no murmur, click, rub or gallop GI: soft, non-tender; bowel sounds normal; no masses,  no organomegaly Extremities: extremities normal, atraumatic, no cyanosis or edema Neurologic: Alert and oriented X 3, normal strength and tone. Normal symmetric reflexes. Normal coordination and gait  ECOG PERFORMANCE STATUS: 0 - Asymptomatic  Blood pressure 139/81, pulse (!) 57, temperature 98.6 F (37 C), temperature source Oral, resp. rate 18, height _0  (1.88 m), weight 208 lb 12.8 oz (94.7 kg), SpO2 100 %.  LABORATORY DATA: Lab Results  Component Value Date   WBC 3.5 (L) 02/17/2017   HGB 14.3 02/17/2017   HCT 42.6 02/17/2017   MCV 89.2 02/17/2017   PLT 142 02/17/2017      Chemistry      Component Value Date/Time   NA 142 02/17/2017 0843   K 4.7 02/17/2017 0843   CO2 25 02/17/2017 0843   BUN 11.7 02/17/2017 0843   CREATININE 1.3 02/17/2017 0843      Component Value Date/Time   CALCIUM 10.2 02/17/2017 0843   ALKPHOS 80 02/17/2017 0843   AST 28 02/17/2017 0843   ALT 19 02/17/2017 0843   BILITOT 0.80 02/17/2017 0843       RADIOGRAPHIC STUDIES: Ct Chest W Contrast  Result Date: 02/21/2017 CLINICAL DATA:  Metastatic lung cancer to the liver in brain, and skeleton, diagnosed in 2015. Oral chemotherapy in progress. EXAM: CT CHEST, ABDOMEN, AND PELVIS WITH CONTRAST TECHNIQUE: Multidetector CT imaging of the chest, abdomen and pelvis was performed following the standard protocol during bolus administration of intravenous contrast. CONTRAST:  15m ISOVUE-300 IOPAMIDOL  (ISOVUE-300) INJECTION 61% COMPARISON:  Multiple exams, including 12/09/2016 and 10/18/2016 FINDINGS: CT CHEST FINDINGS Cardiovascular: Mildly ectatic ascending thoracic aorta at 3.6 cm. No aneurysm. Mediastinum/Nodes: Lymph node adjacent to the distal esophagus 0.6 cm in short axis on image 50/2, previously not well seen. Small type 1 hiatal hernia. Lungs/Pleura: Biapical pleuroparenchymal scarring. 1.7 by 1.1 cm right upper lobe nodule just above the minor fissure on image 81/4, stable by my measurements. Scattered nodularity elsewhere in the lungs, primarily noncalcified, but with some calcified granulomas as well. 1.7 by 1.4 cm ground-glass density left upper lobe nodule on image 46/4, stable by my measurement. Musculoskeletal: Extensive primarily sclerotic metastatic disease to the thorax including most of the ribs, scapula, sternum, thoracic vertebral levels, clavicles, and proximal humerus bilaterally. Overall burden of disease and appearance is similar. There is a lytic component to the right medial clavicular lesion as noted previously. CT ABDOMEN PELVIS FINDINGS Hepatobiliary: No current active malignancy is appreciable in the liver. Gallbladder unremarkable. Pancreas: Unremarkable Spleen: Unremarkable Adrenals/Urinary Tract: 7 mm hypodense right mid kidney lesion is technically too small to characterize although statistically likely to be a small cyst. Otherwise unremarkable. Adrenal glands normal. Stomach/Bowel: Unremarkable Vascular/Lymphatic: Unremarkable Reproductive: Unremarkable Other: No supplemental non-categorized findings. Musculoskeletal: Stable appearance of widespread sclerotic lesions compatible with previous metastatic disease, not appreciably changed over the last several exams. IMPRESSION: 1. Stable appearance  of widespread sclerotic skeletal lesions compatible with previous metastatic disease. No definite new lesions or change. 2. Stable appearance of the dominant solid 1.7 by 1.1 cm  right upper lobe nodule. Scattered nodularity elsewhere in the lungs, primarily noncalcified, especially in the right lung, probably from prior metastatic disease. 3. The 1.7 by 1.4 cm ground-glass density in the left upper lobe is stable back through February 2018, and merits surveillance. Low-grade adenocarcinoma can sometimes have this appearance. Electronically Signed   By: Van Clines M.D.   On: 02/21/2017 13:55   Ct Abdomen Pelvis W Contrast  Result Date: 02/21/2017 CLINICAL DATA:  Metastatic lung cancer to the liver in brain, and skeleton, diagnosed in 2015. Oral chemotherapy in progress. EXAM: CT CHEST, ABDOMEN, AND PELVIS WITH CONTRAST TECHNIQUE: Multidetector CT imaging of the chest, abdomen and pelvis was performed following the standard protocol during bolus administration of intravenous contrast. CONTRAST:  113m ISOVUE-300 IOPAMIDOL (ISOVUE-300) INJECTION 61% COMPARISON:  Multiple exams, including 12/09/2016 and 10/18/2016 FINDINGS: CT CHEST FINDINGS Cardiovascular: Mildly ectatic ascending thoracic aorta at 3.6 cm. No aneurysm. Mediastinum/Nodes: Lymph node adjacent to the distal esophagus 0.6 cm in short axis on image 50/2, previously not well seen. Small type 1 hiatal hernia. Lungs/Pleura: Biapical pleuroparenchymal scarring. 1.7 by 1.1 cm right upper lobe nodule just above the minor fissure on image 81/4, stable by my measurements. Scattered nodularity elsewhere in the lungs, primarily noncalcified, but with some calcified granulomas as well. 1.7 by 1.4 cm ground-glass density left upper lobe nodule on image 46/4, stable by my measurement. Musculoskeletal: Extensive primarily sclerotic metastatic disease to the thorax including most of the ribs, scapula, sternum, thoracic vertebral levels, clavicles, and proximal humerus bilaterally. Overall burden of disease and appearance is similar. There is a lytic component to the right medial clavicular lesion as noted previously. CT ABDOMEN PELVIS  FINDINGS Hepatobiliary: No current active malignancy is appreciable in the liver. Gallbladder unremarkable. Pancreas: Unremarkable Spleen: Unremarkable Adrenals/Urinary Tract: 7 mm hypodense right mid kidney lesion is technically too small to characterize although statistically likely to be a small cyst. Otherwise unremarkable. Adrenal glands normal. Stomach/Bowel: Unremarkable Vascular/Lymphatic: Unremarkable Reproductive: Unremarkable Other: No supplemental non-categorized findings. Musculoskeletal: Stable appearance of widespread sclerotic lesions compatible with previous metastatic disease, not appreciably changed over the last several exams. IMPRESSION: 1. Stable appearance of widespread sclerotic skeletal lesions compatible with previous metastatic disease. No definite new lesions or change. 2. Stable appearance of the dominant solid 1.7 by 1.1 cm right upper lobe nodule. Scattered nodularity elsewhere in the lungs, primarily noncalcified, especially in the right lung, probably from prior metastatic disease. 3. The 1.7 by 1.4 cm ground-glass density in the left upper lobe is stable back through February 2018, and merits surveillance. Low-grade adenocarcinoma can sometimes have this appearance. Electronically Signed   By: WVan ClinesM.D.   On: 02/21/2017 13:55    ASSESSMENT AND PLAN: This is a very pleasant 51years old white male with metastatic non-small cell lung cancer, adenocarcinoma with positive EGFR mutation with deletion in the 19 status post treatment with Gilotrif for 20 months discontinued secondary to disease progression and development of EGFR T790M resistant mutation. The patient is currently on treatment with Tagrisso 80 mg by mouth daily status post 13 months Has been tolerating this treatment well with no significant adverse effects. He had repeat CT scan of the chest, abdomen and pelvis performed recently. I personally and independently reviewed the scans and discuss the  results with the patient today. His scan showed  no evidence for disease progression. I discussed the results with the patient and recommended for him to continue his current treatment with Tagrisso with the same dose. For the metastatic bone disease, he will continue his treatment with Xgeva. The patient would come back for follow-up visit in 2 months for reevaluation with repeat CBC and comprehensive metabolic panel. He was advised to call immediately if he has any concerning symptoms in the interval. The patient voices understanding of current disease status and treatment options and is in agreement with the current care plan. All questions were answered. The patient knows to call the clinic with any problems, questions or concerns. We can certainly see the patient much sooner if necessary.  Disclaimer: This note was dictated with voice recognition software. Similar sounding words can inadvertently be transcribed and may not be corrected upon review.

## 2017-02-22 NOTE — Telephone Encounter (Signed)
Scheduled appt per 6/12 los - Gave patient AVS and calender.

## 2017-03-01 NOTE — Progress Notes (Addendum)
Oscar Floyd 51 yo man with EGFR positive [name] of the right upper lobe with painful right clavicle metastasis radiation completed 12-24-16, review 03-04-17 MRI brain, FU.     Pain:Headache 1/10 Aleve with relief Skin:Normal color to radiation site. ROM:ROM normal to right arm. Fatigue:Has a good energy level doing normal daily routine. Weight: Wt Readings from Last 3 Encounters:  03/09/17 209 lb 12.8 oz (95.2 kg)  02/22/17 208 lb 12.8 oz (94.7 kg)  01/20/17 210 lb 12.8 oz (95.6 kg)   Imaging: 03-04-17 MRI brain Lab: 02-22-17 Saw Dr. Julien Nordmann his scan showed no evidence for disease progression. I discussed the results with the patient and recommended for him to continue his current treatment with Tagrisso with the same dose. For the metastatic bone disease, he will continue his treatment with Xgeva. The patient would come back for follow-up visit in 2 months for reevaluation with repeat CBC and comprehensive metabolic panel. BP 136/80   Pulse 71   Temp 98.3 F (36.8 C) (Oral)   Resp 18   Ht 6' 2"  (1.88 m)   Wt 209 lb 12.8 oz (95.2 kg)   SpO2 100%   BMI 26.94 kg/m

## 2017-03-04 ENCOUNTER — Encounter: Payer: Self-pay | Admitting: Radiation Oncology

## 2017-03-04 ENCOUNTER — Ambulatory Visit
Admission: RE | Admit: 2017-03-04 | Discharge: 2017-03-04 | Disposition: A | Discharge status: Home / Self Care | Payer: BLUE CROSS/BLUE SHIELD | Source: Ambulatory Visit | Attending: Radiation Oncology | Admitting: Radiation Oncology

## 2017-03-04 DIAGNOSIS — C7931 Secondary malignant neoplasm of brain: Secondary | ICD-10-CM

## 2017-03-04 DIAGNOSIS — C7949 Secondary malignant neoplasm of other parts of nervous system: Principal | ICD-10-CM

## 2017-03-04 MED ORDER — GADOBENATE DIMEGLUMINE 529 MG/ML IV SOLN
20.0000 mL | Freq: Once | INTRAVENOUS | Status: AC | PRN
  Administered 2017-03-04: 19 mL via INTRAVENOUS

## 2017-03-09 ENCOUNTER — Ambulatory Visit
Admission: RE | Admit: 2017-03-09 | Discharge: 2017-03-09 | Disposition: A | Discharge status: Home / Self Care | Payer: BLUE CROSS/BLUE SHIELD | Source: Ambulatory Visit | Attending: Urology | Admitting: Urology

## 2017-03-09 ENCOUNTER — Encounter: Payer: Self-pay | Admitting: Urology

## 2017-03-09 VITALS — BP 136/80 | HR 71 | Temp 98.3°F | Resp 18 | Ht 74.0 in | Wt 209.8 lb

## 2017-03-09 DIAGNOSIS — C7931 Secondary malignant neoplasm of brain: Secondary | ICD-10-CM | POA: Insufficient documentation

## 2017-03-09 DIAGNOSIS — Z809 Family history of malignant neoplasm, unspecified: Secondary | ICD-10-CM | POA: Diagnosis not present

## 2017-03-09 DIAGNOSIS — C7951 Secondary malignant neoplasm of bone: Secondary | ICD-10-CM | POA: Diagnosis not present

## 2017-03-09 DIAGNOSIS — I10 Essential (primary) hypertension: Secondary | ICD-10-CM | POA: Diagnosis not present

## 2017-03-09 DIAGNOSIS — C7949 Secondary malignant neoplasm of other parts of nervous system: Secondary | ICD-10-CM | POA: Diagnosis present

## 2017-03-09 DIAGNOSIS — C787 Secondary malignant neoplasm of liver and intrahepatic bile duct: Secondary | ICD-10-CM | POA: Insufficient documentation

## 2017-03-09 DIAGNOSIS — C3490 Malignant neoplasm of unspecified part of unspecified bronchus or lung: Secondary | ICD-10-CM | POA: Diagnosis not present

## 2017-03-09 NOTE — Addendum Note (Signed)
Encounter addended by: Malena Edman, RN on: 03/09/2017  4:15 PM<BR>    Actions taken: Charge Capture section accepted

## 2017-03-09 NOTE — Progress Notes (Signed)
Radiation Oncology         [phone]   Name: Oscar Floyd   Date: 03/09/2017   MRN: [id number]  DOB: [birth year]    Multidisciplinary Brain and Spine Oncology Clinic Follow-Up Visit Note  CC: [name], MD  [name], MD    ICD-10-CM   1. Numerous sub-centimeter brain metastases C79.31    C79.49     Diagnosis:  51 y.o. 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  04/01/2014-04/12/2014: The whole brain was treated to 30 Gy in 10 fractions of 3 Gy  Narrative:  The patient returns today for routine follow-up. In summary this is a pleasant 51 y.o. 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 He received whole brain radiotherapy to the brain in 2015 and has been radiographically without disease since that time. He continues with Dr. Julien Nordmann and is currently undergoing treatment with Tagrisso 80 mg by mouth daily. He proceeded with an MRI of the brain on 03/04/17 which demonstrates continued improvement following whole-brain irradiation with no residual active intracranial metastatic deposits evident. He comes today to review these results as well as follow up s/p recent palliative radiotherapy for a painful bony met in the right clavicle. He reports complete resolution of his right clavicular pain since completing palliative XRT.  Patient reports that he continues to take Tagrisso as directed by Dr. Julien Nordmann for systemic therapy. He also receives [place] monthly for his bony metastatic disease. Recent CT C/A/P on 02/21/17 showed no evidence for disease progression.   On review of systems, the patient reports  that he is doing well overall. He is currently without complaints and specifically denies frequent headaches, visual or auditory disturbances, and reports stable changes and short-term memory since completing whole brain radiation. He denies any chest pain, shortness of breath, cough, fevers, chills, night sweats, unintended weight changes. He denies any bowel or bladder disturbances, and denies abdominal pain, nausea or vomiting. He reports complete resolution of the right clavicular pain from bony metastasis s/p palliative radiotherapy.  He denies any new musculoskeletal or joint aches or pains, new skin lesions or concerns. A complete review of systems is obtained and is otherwise negative.    Past Medical History:  Past Medical History:  Diagnosis Date  . Bone metastases ([place]) 08/26/2015  . Encounter for antineoplastic chemotherapy 01/06/2016  . Family history of cancer   . Hypertension   . Lung cancer ([place])    RUL lung with mets to liver, bone and brain    Past Surgical History: Past Surgical History:  Procedure Laterality Date  . VASECTOMY    . VIDEO BRONCHOSCOPY Bilateral 03/20/2014   Procedure: VIDEO BRONCHOSCOPY WITH FLUORO;  Surgeon: Rigoberto Noel, MD;  Location: [facility];  Service: Cardiopulmonary;  Laterality: Bilateral;    Social History:  Social History   Social History  . Marital status: Married    Spouse name: N/A  . Number of children: 2  . Years of education: N/A   Occupational History  . Not on file.   Social History Main Topics  . Smoking status: Never Smoker  . Smokeless tobacco: Never Used  . Alcohol use  Yes     Comment: socially  . Drug use: No  . Sexual activity: Yes   Other Topics Concern  . Not on file   Social History Narrative  . No narrative on file  The patient is married and has 2 sons, he lives in [place], [place]. He works for [employer].  Family History: Family History  Problem Relation Age of Onset  . Cancer Father 43        liposarcoma age 18  . Cancer Mother 33       gist  . Cancer Paternal Grandfather        lung cancer; heavy smoker; also had abdominal cancer in 15's  . Stroke Maternal Grandmother   . Heart disease Maternal Grandfather   . Cancer Other        multiple brothers of PGF with unknown cancer    ALLERGIES:  has No Known Allergies.  Meds: Current Outpatient Prescriptions  Medication Sig Dispense Refill  . calcium-vitamin D (OSCAL) 250-125 MG-UNIT per tablet Take 1 tablet by mouth daily. Reported on 01/12/2016    . esomeprazole (NEXIUM) 20 MG capsule Take 20 mg by mouth 2 (two) times daily before a meal.     . naproxen sodium (ANAPROX) 220 MG tablet Take 220 mg by mouth as needed.    Marland Kitchen osimertinib mesylate (TAGRISSO) 80 MG tablet Take 1 tablet (80 mg total) by mouth daily. 30 tablet 0  . ALPRAZolam (XANAX) 1 MG tablet Reported on 02/11/2016    . diazepam (VALIUM) 10 MG tablet Take 1 tablet (10 mg total) by mouth as directed. 30 min before MRI (Patient not taking: Reported on 09/16/2016) 15 tablet 0  . loperamide (IMODIUM) 2 MG capsule Take 2 mg by mouth as needed for diarrhea or loose stools.    . mirtazapine (REMERON) 30 MG tablet Take 1 tablet (30 mg total) by mouth at bedtime. (Patient not taking: Reported on 12/16/2016) 30 tablet 2   No current facility-administered medications for this encounter.     Physical Findings:  height is 6' 2"  (1.88 m) and weight is 209 lb 12.8 oz (95.2 kg). His oral temperature is 98.3 F (36.8 C). His blood pressure is 136/80 and his pulse is 71. His respiration is 18 and oxygen saturation is 100%.   In general this is a well appearing caucasian male in no acute distress. He's alert and oriented x4 and appropriate throughout the examination. Cardiopulmonary assessment is negative for acute distress and he exhibits normal effort. He appears to be grossly intact from a neurologic perspective. Minimal residual hyperpigmentation over the right clavicle but no pain with  palpation or ROM of the upper extremities.  Lab Findings: Lab Results  Component Value Date   WBC 3.5 (L) 02/17/2017   HGB 14.3 02/17/2017   HCT 42.6 02/17/2017   MCV 89.2 02/17/2017   PLT 142 02/17/2017    Radiographic Findings: Ct Chest W Contrast  Result Date: 02/21/2017 CLINICAL DATA:  Metastatic lung cancer to the liver in brain, and skeleton, diagnosed in 2015. Oral chemotherapy in progress. EXAM: CT CHEST, ABDOMEN, AND PELVIS WITH CONTRAST TECHNIQUE: Multidetector CT imaging of the chest, abdomen and pelvis was performed following the standard protocol during bolus administration of intravenous contrast. CONTRAST:  182m ISOVUE-300 IOPAMIDOL (ISOVUE-300) INJECTION 61% COMPARISON:  Multiple exams, including 12/09/2016 and 10/18/2016 FINDINGS: CT CHEST FINDINGS Cardiovascular: Mildly ectatic ascending thoracic aorta at 3.6 cm. No aneurysm. Mediastinum/Nodes: Lymph node adjacent to the distal esophagus 0.6 cm in short  axis on image 50/2, previously not well seen. Small type 1 hiatal hernia. Lungs/Pleura: Biapical pleuroparenchymal scarring. 1.7 by 1.1 cm right upper lobe nodule just above the minor fissure on image 81/4, stable by my measurements. Scattered nodularity elsewhere in the lungs, primarily noncalcified, but with some calcified granulomas as well. 1.7 by 1.4 cm ground-glass density left upper lobe nodule on image 46/4, stable by my measurement. Musculoskeletal: Extensive primarily sclerotic metastatic disease to the thorax including most of the ribs, scapula, sternum, thoracic vertebral levels, clavicles, and proximal humerus bilaterally. Overall burden of disease and appearance is similar. There is a lytic component to the right medial clavicular lesion as noted previously. CT ABDOMEN PELVIS FINDINGS Hepatobiliary: No current active malignancy is appreciable in the liver. Gallbladder unremarkable. Pancreas: Unremarkable Spleen: Unremarkable Adrenals/Urinary Tract: 7 mm hypodense right  mid kidney lesion is technically too small to characterize although statistically likely to be a small cyst. Otherwise unremarkable. Adrenal glands normal. Stomach/Bowel: Unremarkable Vascular/Lymphatic: Unremarkable Reproductive: Unremarkable Other: No supplemental non-categorized findings. Musculoskeletal: Stable appearance of widespread sclerotic lesions compatible with previous metastatic disease, not appreciably changed over the last several exams. IMPRESSION: 1. Stable appearance of widespread sclerotic skeletal lesions compatible with previous metastatic disease. No definite new lesions or change. 2. Stable appearance of the dominant solid 1.7 by 1.1 cm right upper lobe nodule. Scattered nodularity elsewhere in the lungs, primarily noncalcified, especially in the right lung, probably from prior metastatic disease. 3. The 1.7 by 1.4 cm ground-glass density in the left upper lobe is stable back through February 2018, and merits surveillance. Low-grade adenocarcinoma can sometimes have this appearance. Electronically Signed   By: Van Clines M.D.   On: 02/21/2017 13:55   Mr [name] [place] Contrast  Result Date: 03/04/2017 CLINICAL DATA:  SRS brain. Lung cancer with treated metastases. Whole-brain radiation. EXAM: MRI HEAD WITHOUT AND WITH CONTRAST TECHNIQUE: Multiplanar, multiecho pulse sequences of the brain and surrounding structures were obtained without and with intravenous contrast. CONTRAST:  21m MULTIHANCE GADOBENATE DIMEGLUMINE 529 MG/ML IV SOLN COMPARISON:  10/14/2016 most recent. 03/26/2014 original MR. FINDINGS: Brain: No acute infarction, hemorrhage, hydrocephalus, extra-axial collection or mass lesion. Mild atrophy. No significant leukoencephalopathy. A few small T2 hyperintensities in the brain represent areas of treated tumor. No residual active metastatic brain deposits are evident. No abnormal postcontrast enhancement. No vasogenic edema. No new lesions. Vascular: Normal flow voids.  Skull and upper cervical spine: No progression of previously identified skullbase and cervical spine metastases. No epidural tumor. Sinuses/Orbits: Negative. Other: None. IMPRESSION: Continued improvement following whole-brain irradiation. No residual active intracranial metastatic deposits are evident. Electronically Signed   By: JStaci RighterM.D.   On: 03/04/2017 14:33   Ct Abdomen Pelvis W Contrast  Result Date: 02/21/2017 CLINICAL DATA:  Metastatic lung cancer to the liver in brain, and skeleton, diagnosed in 2015. Oral chemotherapy in progress. EXAM: CT CHEST, ABDOMEN, AND PELVIS WITH CONTRAST TECHNIQUE: Multidetector CT imaging of the chest, abdomen and pelvis was performed following the standard protocol during bolus administration of intravenous contrast. CONTRAST:  1027mISOVUE-300 IOPAMIDOL (ISOVUE-300) INJECTION 61% COMPARISON:  Multiple exams, including 12/09/2016 and 10/18/2016 FINDINGS: CT CHEST FINDINGS Cardiovascular: Mildly ectatic ascending thoracic aorta at 3.6 cm. No aneurysm. Mediastinum/Nodes: Lymph node adjacent to the distal esophagus 0.6 cm in short axis on image 50/2, previously not well seen. Small type 1 hiatal hernia. Lungs/Pleura: Biapical pleuroparenchymal scarring. 1.7 by 1.1 cm right upper lobe nodule just above the minor fissure on image 81/4, stable by my  measurements. Scattered nodularity elsewhere in the lungs, primarily noncalcified, but with some calcified granulomas as well. 1.7 by 1.4 cm ground-glass density left upper lobe nodule on image 46/4, stable by my measurement. Musculoskeletal: Extensive primarily sclerotic metastatic disease to the thorax including most of the ribs, scapula, sternum, thoracic vertebral levels, clavicles, and proximal humerus bilaterally. Overall burden of disease and appearance is similar. There is a lytic component to the right medial clavicular lesion as noted previously. CT ABDOMEN PELVIS FINDINGS Hepatobiliary: No current active malignancy  is appreciable in the liver. Gallbladder unremarkable. Pancreas: Unremarkable Spleen: Unremarkable Adrenals/Urinary Tract: 7 mm hypodense right mid kidney lesion is technically too small to characterize although statistically likely to be a small cyst. Otherwise unremarkable. Adrenal glands normal. Stomach/Bowel: Unremarkable Vascular/Lymphatic: Unremarkable Reproductive: Unremarkable Other: No supplemental non-categorized findings. Musculoskeletal: Stable appearance of widespread sclerotic lesions compatible with previous metastatic disease, not appreciably changed over the last several exams. IMPRESSION: 1. Stable appearance of widespread sclerotic skeletal lesions compatible with previous metastatic disease. No definite new lesions or change. 2. Stable appearance of the dominant solid 1.7 by 1.1 cm right upper lobe nodule. Scattered nodularity elsewhere in the lungs, primarily noncalcified, especially in the right lung, probably from prior metastatic disease. 3. The 1.7 by 1.4 cm ground-glass density in the left upper lobe is stable back through February 2018, and merits surveillance. Low-grade adenocarcinoma can sometimes have this appearance. Electronically Signed   By: Van Clines M.D.   On: 02/21/2017 13:55   Impression/Plan: 1.  Stage IV NSCLC, adenocarcinoma of the right upper lobe with disease to the liver, brain and bone. He has had resolution of the right clavicular pain since completing palliative radiotherapy.  The patient has no evidence of progressive or recurrent brain metastases at this time on recent MRI which was reviewed at the multidisciplinary brain conference this morning. He was given a copy of the report for his records.   We will continue serial imaging with MRI in 4 months and a follow up afterwards.  He will continue close follow up with Dr. Julien Nordmann for systemic disease management and surveillance.  Recent CT C/A/P showed no evidence for disease progression.   The current plan  is to continue with his current systemic treatment with Tagrisso and Delton See.     Nicholos Johns, PA-C

## 2017-03-17 ENCOUNTER — Ambulatory Visit: Payer: BLUE CROSS/BLUE SHIELD

## 2017-03-18 ENCOUNTER — Telehealth: Payer: Self-pay | Admitting: Internal Medicine

## 2017-03-18 NOTE — Telephone Encounter (Signed)
Scheduled appt per sch message from [facility] - patient is aware of appt date and time.

## 2017-03-22 ENCOUNTER — Other Ambulatory Visit: Payer: Self-pay | Admitting: Medical Oncology

## 2017-03-22 DIAGNOSIS — C3491 Malignant neoplasm of unspecified part of right bronchus or lung: Secondary | ICD-10-CM

## 2017-03-22 DIAGNOSIS — Z5111 Encounter for antineoplastic chemotherapy: Secondary | ICD-10-CM

## 2017-03-22 DIAGNOSIS — C7949 Secondary malignant neoplasm of other parts of nervous system: Secondary | ICD-10-CM

## 2017-03-22 DIAGNOSIS — C7951 Secondary malignant neoplasm of bone: Secondary | ICD-10-CM

## 2017-03-22 DIAGNOSIS — C7931 Secondary malignant neoplasm of brain: Secondary | ICD-10-CM

## 2017-03-22 MED ORDER — OSIMERTINIB MESYLATE 80 MG PO TABS: 80.0000 mg | ORAL_TABLET | Freq: Every day | ORAL | 1 refills | Status: DC

## 2017-03-25 ENCOUNTER — Ambulatory Visit: Payer: BLUE CROSS/BLUE SHIELD

## 2017-03-25 ENCOUNTER — Encounter (HOSPITAL_BASED_OUTPATIENT_CLINIC_OR_DEPARTMENT_OTHER): Payer: BLUE CROSS/BLUE SHIELD

## 2017-03-25 ENCOUNTER — Other Ambulatory Visit (HOSPITAL_BASED_OUTPATIENT_CLINIC_OR_DEPARTMENT_OTHER): Payer: BLUE CROSS/BLUE SHIELD

## 2017-03-25 DIAGNOSIS — C7931 Secondary malignant neoplasm of brain: Secondary | ICD-10-CM

## 2017-03-25 DIAGNOSIS — C7951 Secondary malignant neoplasm of bone: Secondary | ICD-10-CM

## 2017-03-25 DIAGNOSIS — Z5111 Encounter for antineoplastic chemotherapy: Secondary | ICD-10-CM

## 2017-03-25 DIAGNOSIS — C7949 Secondary malignant neoplasm of other parts of nervous system: Secondary | ICD-10-CM

## 2017-03-25 DIAGNOSIS — C3491 Malignant neoplasm of unspecified part of right bronchus or lung: Secondary | ICD-10-CM

## 2017-03-25 DIAGNOSIS — C3400 Malignant neoplasm of unspecified main bronchus: Secondary | ICD-10-CM | POA: Diagnosis not present

## 2017-03-25 LAB — COMPREHENSIVE METABOLIC PANEL
ALBUMIN: 3.6 g/dL (ref 3.5–5.0)
ALK PHOS: 72 U/L (ref 40–150)
ALT: 16 U/L (ref 0–55)
ANION GAP: 8 meq/L (ref 3–11)
AST: 22 U/L (ref 5–34)
BILIRUBIN TOTAL: 0.57 mg/dL (ref 0.20–1.20)
BUN: 11.3 mg/dL (ref 7.0–26.0)
CALCIUM: 9.1 mg/dL (ref 8.4–10.4)
CO2: 25 meq/L (ref 22–29)
CREATININE: 1.2 mg/dL (ref 0.7–1.3)
Chloride: 110 mEq/L — ABNORMAL HIGH (ref 98–109)
EGFR: 70 mL/min/{1.73_m2} — AB (ref >90)
Glucose: 100 mg/dl (ref 70–140)
Potassium: 4.1 mEq/L (ref 3.5–5.1)
Sodium: 143 mEq/L (ref 136–145)
TOTAL PROTEIN: 6.4 g/dL (ref 6.4–8.3)

## 2017-03-25 LAB — CBC WITH DIFFERENTIAL/PLATELET
BASO%: 0.4 % (ref 0.0–2.0)
Basophils Absolute: 0 10*3/uL (ref 0.0–0.1)
EOS ABS: 0.1 10*3/uL (ref 0.0–0.5)
EOS%: 3.6 % (ref 0.0–7.0)
HEMATOCRIT: 41.3 % (ref 38.4–49.9)
HGB: 13.7 g/dL (ref 13.0–17.1)
LYMPH#: 0.8 10*3/uL — AB (ref 0.9–3.3)
LYMPH%: 26 % (ref 14.0–49.0)
MCH: 29.6 pg (ref 27.2–33.4)
MCHC: 33.3 g/dL (ref 32.0–36.0)
MCV: 89 fL (ref 79.3–98.0)
MONO#: 0.2 10*3/uL (ref 0.1–0.9)
MONO%: 7.9 % (ref 0.0–14.0)
NEUT%: 62.1 % (ref 39.0–75.0)
NEUTROS ABS: 2 10*3/uL (ref 1.5–6.5)
PLATELETS: 126 10*3/uL — AB (ref 140–400)
RBC: 4.64 10*6/uL (ref 4.20–5.82)
RDW: 14.5 % (ref 11.0–14.6)
WBC: 3.2 10*3/uL — AB (ref 4.0–10.3)

## 2017-03-25 MED ORDER — DENOSUMAB 120 MG/1.7ML ~~LOC~~ SOLN
120.0000 mg | Freq: Once | SUBCUTANEOUS | Status: AC
  Administered 2017-03-25: 120 mg via SUBCUTANEOUS
  Filled 2017-03-25: qty 1.7

## 2017-03-25 NOTE — Patient Instructions (Signed)
Denosumab injection What is this medicine? DENOSUMAB (den oh sue mab) slows bone breakdown. Prolia is used to treat osteoporosis in women after menopause and in men. Xgeva is used to prevent bone fractures and other bone problems caused by cancer bone metastases. Xgeva is also used to treat giant cell tumor of the bone. This medicine may be used for other purposes; ask your health care provider or pharmacist if you have questions. COMMON BRAND NAME(S): Prolia, XGEVA What should I tell my health care provider before I take this medicine? They need to know if you have any of these conditions: -dental disease -eczema -infection or history of infections -kidney disease or on dialysis -low blood calcium or vitamin D -malabsorption syndrome -scheduled to have surgery or tooth extraction -taking medicine that contains denosumab -thyroid or parathyroid disease -an unusual reaction to denosumab, other medicines, foods, dyes, or preservatives -pregnant or trying to get pregnant -breast-feeding How should I use this medicine? This medicine is for injection under the skin. It is given by a health care professional in a hospital or clinic setting. If you are getting Prolia, a special MedGuide will be given to you by the pharmacist with each prescription and refill. Be sure to read this information carefully each time. For Prolia, talk to your pediatrician regarding the use of this medicine in children. Special care may be needed. For Xgeva, talk to your pediatrician regarding the use of this medicine in children. While this drug may be prescribed for children as young as 13 years for selected conditions, precautions do apply. Overdosage: If you think you've taken too much of this medicine contact a poison control center or emergency room at once. Overdosage: If you think you have taken too much of this medicine contact a poison control center or emergency room at once. NOTE: This medicine is only for  you. Do not share this medicine with others. What if I miss a dose? It is important not to miss your dose. Call your doctor or health care professional if you are unable to keep an appointment. What may interact with this medicine? Do not take this medicine with any of the following medications: -other medicines containing denosumab This medicine may also interact with the following medications: -medicines that suppress the immune system -medicines that treat cancer -steroid medicines like prednisone or cortisone This list may not describe all possible interactions. Give your health care provider a list of all the medicines, herbs, non-prescription drugs, or dietary supplements you use. Also tell them if you smoke, drink alcohol, or use illegal drugs. Some items may interact with your medicine. What should I watch for while using this medicine? Visit your doctor or health care professional for regular checks on your progress. Your doctor or health care professional may order blood tests and other tests to see how you are doing. Call your doctor or health care professional if you get a cold or other infection while receiving this medicine. Do not treat yourself. This medicine may decrease your body's ability to fight infection. You should make sure you get enough calcium and vitamin D while you are taking this medicine, unless your doctor tells you not to. Discuss the foods you eat and the vitamins you take with your health care professional. See your dentist regularly. Brush and floss your teeth as directed. Before you have any dental work done, tell your dentist you are receiving this medicine. Do not become pregnant while taking this medicine or for 5 months after stopping   it. Women should inform their doctor if they wish to become pregnant or think they might be pregnant. There is a potential for serious side effects to an unborn child. Talk to your health care professional or pharmacist for more  information. What side effects may I notice from receiving this medicine? Side effects that you should report to your doctor or health care professional as soon as possible: -allergic reactions like skin rash, itching or hives, swelling of the face, lips, or tongue -breathing problems -chest pain -fast, irregular heartbeat -feeling faint or lightheaded, falls -fever, chills, or any other sign of infection -muscle spasms, tightening, or twitches -numbness or tingling -skin blisters or bumps, or is dry, peels, or red -slow healing or unexplained pain in the mouth or jaw -unusual bleeding or bruising Side effects that usually do not require medical attention (Report these to your doctor or health care professional if they continue or are bothersome.): -muscle pain -stomach upset, gas This list may not describe all possible side effects. Call your doctor for medical advice about side effects. You may report side effects to FDA at 1-800-FDA-1088. Where should I keep my medicine? This medicine is only given in a clinic, doctor's office, or other health care setting and will not be stored at home. NOTE: This sheet is a summary. It may not cover all possible information. If you have questions about this medicine, talk to your doctor, pharmacist, or health care provider.  2015, Elsevier/Gold Standard. (2012-02-28 12:37:47)  

## 2017-04-14 ENCOUNTER — Ambulatory Visit: Payer: BLUE CROSS/BLUE SHIELD

## 2017-04-28 ENCOUNTER — Telehealth: Payer: Self-pay | Admitting: Internal Medicine

## 2017-04-28 ENCOUNTER — Ambulatory Visit (HOSPITAL_BASED_OUTPATIENT_CLINIC_OR_DEPARTMENT_OTHER): Payer: BLUE CROSS/BLUE SHIELD | Admitting: Internal Medicine

## 2017-04-28 ENCOUNTER — Ambulatory Visit (HOSPITAL_BASED_OUTPATIENT_CLINIC_OR_DEPARTMENT_OTHER): Payer: BLUE CROSS/BLUE SHIELD

## 2017-04-28 ENCOUNTER — Encounter: Payer: Self-pay | Admitting: Internal Medicine

## 2017-04-28 ENCOUNTER — Other Ambulatory Visit (HOSPITAL_BASED_OUTPATIENT_CLINIC_OR_DEPARTMENT_OTHER): Payer: BLUE CROSS/BLUE SHIELD

## 2017-04-28 VITALS — BP 117/75 | HR 72 | Temp 98.4°F | Resp 20 | Ht 74.0 in | Wt 206.2 lb

## 2017-04-28 DIAGNOSIS — C7931 Secondary malignant neoplasm of brain: Secondary | ICD-10-CM | POA: Diagnosis not present

## 2017-04-28 DIAGNOSIS — C7949 Secondary malignant neoplasm of other parts of nervous system: Secondary | ICD-10-CM

## 2017-04-28 DIAGNOSIS — C787 Secondary malignant neoplasm of liver and intrahepatic bile duct: Secondary | ICD-10-CM | POA: Diagnosis not present

## 2017-04-28 DIAGNOSIS — C3411 Malignant neoplasm of upper lobe, right bronchus or lung: Secondary | ICD-10-CM

## 2017-04-28 DIAGNOSIS — Z5111 Encounter for antineoplastic chemotherapy: Secondary | ICD-10-CM

## 2017-04-28 DIAGNOSIS — C3400 Malignant neoplasm of unspecified main bronchus: Secondary | ICD-10-CM

## 2017-04-28 DIAGNOSIS — Z923 Personal history of irradiation: Secondary | ICD-10-CM | POA: Diagnosis not present

## 2017-04-28 DIAGNOSIS — C7951 Secondary malignant neoplasm of bone: Secondary | ICD-10-CM

## 2017-04-28 DIAGNOSIS — C3491 Malignant neoplasm of unspecified part of right bronchus or lung: Secondary | ICD-10-CM

## 2017-04-28 LAB — CBC WITH DIFFERENTIAL/PLATELET
BASO%: 0.3 % (ref 0.0–2.0)
Basophils Absolute: 0 10*3/uL (ref 0.0–0.1)
EOS%: 6.3 % (ref 0.0–7.0)
Eosinophils Absolute: 0.2 10*3/uL (ref 0.0–0.5)
HCT: 41.1 % (ref 38.4–49.9)
HGB: 13.7 g/dL (ref 13.0–17.1)
LYMPH#: 0.9 10*3/uL (ref 0.9–3.3)
LYMPH%: 27.9 % (ref 14.0–49.0)
MCH: 29.8 pg (ref 27.2–33.4)
MCHC: 33.3 g/dL (ref 32.0–36.0)
MCV: 89.3 fL (ref 79.3–98.0)
MONO#: 0.3 10*3/uL (ref 0.1–0.9)
MONO%: 8.5 % (ref 0.0–14.0)
NEUT#: 1.8 10*3/uL (ref 1.5–6.5)
NEUT%: 57 % (ref 39.0–75.0)
Platelets: 132 10*3/uL — ABNORMAL LOW (ref 140–400)
RBC: 4.6 10*6/uL (ref 4.20–5.82)
RDW: 14 % (ref 11.0–14.6)
WBC: 3.2 10*3/uL — ABNORMAL LOW (ref 4.0–10.3)

## 2017-04-28 LAB — COMPREHENSIVE METABOLIC PANEL
ALT: 18 U/L (ref 0–55)
AST: 30 U/L (ref 5–34)
Albumin: 3.7 g/dL (ref 3.5–5.0)
Alkaline Phosphatase: 97 U/L (ref 40–150)
Anion Gap: 6 mEq/L (ref 3–11)
BUN: 15.2 mg/dL (ref 7.0–26.0)
CO2: 25 mEq/L (ref 22–29)
Calcium: 9.1 mg/dL (ref 8.4–10.4)
Chloride: 110 mEq/L — ABNORMAL HIGH (ref 98–109)
Creatinine: 1.2 mg/dL (ref 0.7–1.3)
EGFR: 68 mL/min/{1.73_m2} — ABNORMAL LOW (ref >90)
Glucose: 116 mg/dl (ref 70–140)
Potassium: 4.4 mEq/L (ref 3.5–5.1)
Sodium: 141 mEq/L (ref 136–145)
Total Bilirubin: 0.68 mg/dL (ref 0.20–1.20)
Total Protein: 6.7 g/dL (ref 6.4–8.3)

## 2017-04-28 MED ORDER — DENOSUMAB 120 MG/1.7ML ~~LOC~~ SOLN
120.0000 mg | Freq: Once | SUBCUTANEOUS | Status: AC
Start: 2017-04-28 — End: 2017-04-28
  Administered 2017-04-28: 120 mg via SUBCUTANEOUS
  Filled 2017-04-28: qty 1.7

## 2017-04-28 NOTE — Telephone Encounter (Signed)
Scheduled appt per 8/16 los - Gave patient AVS and calender per los. - Central radiology to contact patient with ct scan ,.

## 2017-04-28 NOTE — Progress Notes (Signed)
[place] Telephone:([phone]) [phone]   Fax:(336) [phone]  OFFICE PROGRESS NOTE  [name], MD [place] [address]  DIAGNOSIS: stage IV (T2a, N0, M1b) non-small cell lung cancer consistent with poorly differentiated adenocarcinoma with positive EGFR mutation with deletion in exon 19, diagnosed in July of 2015 and presented with right upper lobe lung mass in addition to extensive liver, brain and bone metastases.  He had disease progression and development of EGFR T790M resistant mutation in April 2017.  PRIOR THERAPY: 1) Status post whole brain irradiation under the care Dr. Tammi Klippel completed 04/12/2014. 2) Gilotrif 40 mg po daily - therapy beginning 04/03/2014. Status post approximately 20 months of therapy discontinued secondary to disease progression and development of EGFR T790M resistant mutation.  3) palliative radiotherapy to the lytic lesion in the right clavicle.  CURRENT THERAPY: 1) Tagrisso 80 mg by mouth daily status post 15 months of treatment. First dose was given on 01/12/2016. 2) Xgeva 120 g subcutaneously on monthly basis.  INTERVAL HISTORY: Oscar Floyd 51 y.o. male returns to the clinic today for follow-up visit. The patient is feeling fine with no specific complaints. He denied having any chest pain, shortness of breath, cough or hemoptysis. He has no fever or chills. He denied having any significant nausea, vomiting, diarrhea or constipation. He has no significant skin rash. He is tolerating his current treatment with Tagrisso fairly well. He is here today for evaluation and repeat blood work.   MEDICAL HISTORY: Past Medical History:  Diagnosis Date  . Bone metastases ([place]) 08/26/2015  . Encounter for antineoplastic chemotherapy 01/06/2016  . Family history of cancer   . Hypertension   . Lung cancer ([place])    RUL lung with mets to liver, bone and brain    ALLERGIES:  has No Known Allergies.  MEDICATIONS:    Current Outpatient Prescriptions  Medication Sig Dispense Refill  . ALPRAZolam (XANAX) 1 MG tablet Reported on 02/11/2016    . calcium-vitamin D (OSCAL) 250-125 MG-UNIT per tablet Take 1 tablet by mouth daily. Reported on 01/12/2016    . diazepam (VALIUM) 10 MG tablet Take 1 tablet (10 mg total) by mouth as directed. 30 min before MRI 15 tablet 0  . esomeprazole (NEXIUM) 20 MG capsule Take 20 mg by mouth 2 (two) times daily before a meal.     . loperamide (IMODIUM) 2 MG capsule Take 2 mg by mouth as needed for diarrhea or loose stools.    . mirtazapine (REMERON) 30 MG tablet Take 1 tablet (30 mg total) by mouth at bedtime. 30 tablet 2  . naproxen sodium (ANAPROX) 220 MG tablet Take 220 mg by mouth as needed.    Marland Kitchen osimertinib mesylate (TAGRISSO) 80 MG tablet Take 1 tablet (80 mg total) by mouth daily. 30 tablet 1   No current facility-administered medications for this visit.     SURGICAL HISTORY:  Past Surgical History:  Procedure Laterality Date  . VASECTOMY    . VIDEO BRONCHOSCOPY Bilateral 03/20/2014   Procedure: VIDEO BRONCHOSCOPY WITH FLUORO;  Surgeon: Rigoberto Noel, MD;  Location: [facility];  Service: Cardiopulmonary;  Laterality: Bilateral;    REVIEW OF SYSTEMS:  A comprehensive review of systems was negative.   PHYSICAL EXAMINATION: General appearance: alert, cooperative, appears stated age and no distress Head: Normocephalic, without obvious abnormality, atraumatic Neck: no adenopathy, no JVD, supple, symmetrical, trachea midline and thyroid not enlarged, symmetric, no tenderness/mass/nodules Lymph nodes: Cervical, supraclavicular, and axillary  nodes normal. Resp: clear to auscultation bilaterally Back: symmetric, no curvature. ROM normal. No CVA tenderness. Cardio: regular rate and rhythm, S1, S2 normal, no murmur, click, rub or gallop GI: soft, non-tender; bowel sounds normal; no masses,  no organomegaly Extremities: extremities normal, atraumatic, no cyanosis or  edema  ECOG PERFORMANCE STATUS: 0 - Asymptomatic  Blood pressure 117/75, pulse 72, temperature 98.4 F (36.9 C), temperature source Oral, resp. rate 20, height 6' 2"  (1.88 m), weight 206 lb 3.2 oz (93.5 kg), SpO2 99 %.  LABORATORY DATA: Lab Results  Component Value Date   WBC 3.2 (L) 04/28/2017   HGB 13.7 04/28/2017   HCT 41.1 04/28/2017   MCV 89.3 04/28/2017   PLT 132 (L) 04/28/2017      Chemistry      Component Value Date/Time   NA 143 03/25/2017 0828   K 4.1 03/25/2017 0828   CO2 25 03/25/2017 0828   BUN 11.3 03/25/2017 0828   CREATININE 1.2 03/25/2017 0828      Component Value Date/Time   CALCIUM 9.1 03/25/2017 0828   ALKPHOS 72 03/25/2017 0828   AST 22 03/25/2017 0828   ALT 16 03/25/2017 0828   BILITOT 0.57 03/25/2017 0828       RADIOGRAPHIC STUDIES: No results found.  ASSESSMENT AND PLAN: This is a very pleasant 51 years old white male with metastatic non-small cell lung cancer, adenocarcinoma with positive EGFR mutation with deletion in the 19 status post treatment with Gilotrif for 20 months discontinued secondary to disease progression and development of EGFR T790M resistant mutation. The patient is currently on treatment with Tagrisso 80 mg by mouth daily status post 15 months. The patient continues to tolerate his treatment with Tagrisso fairly well. I recommended for him to continue the treatment as planned. I would see him back for follow-up visit in 2 months for evaluation and repeat CT scan of the chest, abdomen and pelvis for restaging of his disease. Has been tolerating this treatment well with no significant adverse effects. For the metastatic bone disease, he will continue his treatment with Xgeva. He was advised to call immediately if he has any concerning symptoms in the interval. The patient voices understanding of current disease status and treatment options and is in agreement with the current care plan. All questions were answered. The patient  knows to call the clinic with any problems, questions or concerns. We can certainly see the patient much sooner if necessary.  Disclaimer: This note was dictated with voice recognition software. Similar sounding words can inadvertently be transcribed and may not be corrected upon review.

## 2017-04-28 NOTE — Patient Instructions (Signed)
Denosumab injection What is this medicine? DENOSUMAB (den oh sue mab) slows bone breakdown. Prolia is used to treat osteoporosis in women after menopause and in men. Xgeva is used to prevent bone fractures and other bone problems caused by cancer bone metastases. Xgeva is also used to treat giant cell tumor of the bone. This medicine may be used for other purposes; ask your health care provider or pharmacist if you have questions. COMMON BRAND NAME(S): Prolia, XGEVA What should I tell my health care provider before I take this medicine? They need to know if you have any of these conditions: -dental disease -eczema -infection or history of infections -kidney disease or on dialysis -low blood calcium or vitamin D -malabsorption syndrome -scheduled to have surgery or tooth extraction -taking medicine that contains denosumab -thyroid or parathyroid disease -an unusual reaction to denosumab, other medicines, foods, dyes, or preservatives -pregnant or trying to get pregnant -breast-feeding How should I use this medicine? This medicine is for injection under the skin. It is given by a health care professional in a hospital or clinic setting. If you are getting Prolia, a special MedGuide will be given to you by the pharmacist with each prescription and refill. Be sure to read this information carefully each time. For Prolia, talk to your pediatrician regarding the use of this medicine in children. Special care may be needed. For Xgeva, talk to your pediatrician regarding the use of this medicine in children. While this drug may be prescribed for children as young as 13 years for selected conditions, precautions do apply. Overdosage: If you think you've taken too much of this medicine contact a poison control center or emergency room at once. Overdosage: If you think you have taken too much of this medicine contact a poison control center or emergency room at once. NOTE: This medicine is only for  you. Do not share this medicine with others. What if I miss a dose? It is important not to miss your dose. Call your doctor or health care professional if you are unable to keep an appointment. What may interact with this medicine? Do not take this medicine with any of the following medications: -other medicines containing denosumab This medicine may also interact with the following medications: -medicines that suppress the immune system -medicines that treat cancer -steroid medicines like prednisone or cortisone This list may not describe all possible interactions. Give your health care provider a list of all the medicines, herbs, non-prescription drugs, or dietary supplements you use. Also tell them if you smoke, drink alcohol, or use illegal drugs. Some items may interact with your medicine. What should I watch for while using this medicine? Visit your doctor or health care professional for regular checks on your progress. Your doctor or health care professional may order blood tests and other tests to see how you are doing. Call your doctor or health care professional if you get a cold or other infection while receiving this medicine. Do not treat yourself. This medicine may decrease your body's ability to fight infection. You should make sure you get enough calcium and vitamin D while you are taking this medicine, unless your doctor tells you not to. Discuss the foods you eat and the vitamins you take with your health care professional. See your dentist regularly. Brush and floss your teeth as directed. Before you have any dental work done, tell your dentist you are receiving this medicine. Do not become pregnant while taking this medicine or for 5 months after stopping   it. Women should inform their doctor if they wish to become pregnant or think they might be pregnant. There is a potential for serious side effects to an unborn child. Talk to your health care professional or pharmacist for more  information. What side effects may I notice from receiving this medicine? Side effects that you should report to your doctor or health care professional as soon as possible: -allergic reactions like skin rash, itching or hives, swelling of the face, lips, or tongue -breathing problems -chest pain -fast, irregular heartbeat -feeling faint or lightheaded, falls -fever, chills, or any other sign of infection -muscle spasms, tightening, or twitches -numbness or tingling -skin blisters or bumps, or is dry, peels, or red -slow healing or unexplained pain in the mouth or jaw -unusual bleeding or bruising Side effects that usually do not require medical attention (Report these to your doctor or health care professional if they continue or are bothersome.): -muscle pain -stomach upset, gas This list may not describe all possible side effects. Call your doctor for medical advice about side effects. You may report side effects to FDA at 1-800-FDA-1088. Where should I keep my medicine? This medicine is only given in a clinic, doctor's office, or other health care setting and will not be stored at home. NOTE: This sheet is a summary. It may not cover all possible information. If you have questions about this medicine, talk to your doctor, pharmacist, or health care provider.  2015, Elsevier/Gold Standard. (2012-02-28 12:37:47)  

## 2017-05-11 ENCOUNTER — Other Ambulatory Visit: Payer: Self-pay | Admitting: Medical Oncology

## 2017-05-11 DIAGNOSIS — C3491 Malignant neoplasm of unspecified part of right bronchus or lung: Secondary | ICD-10-CM

## 2017-05-11 DIAGNOSIS — Z5111 Encounter for antineoplastic chemotherapy: Secondary | ICD-10-CM

## 2017-05-11 DIAGNOSIS — C7949 Secondary malignant neoplasm of other parts of nervous system: Secondary | ICD-10-CM

## 2017-05-11 DIAGNOSIS — C7951 Secondary malignant neoplasm of bone: Secondary | ICD-10-CM

## 2017-05-11 DIAGNOSIS — C7931 Secondary malignant neoplasm of brain: Secondary | ICD-10-CM

## 2017-05-11 MED ORDER — OSIMERTINIB MESYLATE 80 MG PO TABS: 80.0000 mg | ORAL_TABLET | Freq: Every day | ORAL | 2 refills | Status: DC

## 2017-05-25 ENCOUNTER — Encounter: Payer: Self-pay | Admitting: *Deleted

## 2017-05-25 DIAGNOSIS — C3491 Malignant neoplasm of unspecified part of right bronchus or lung: Secondary | ICD-10-CM

## 2017-05-25 NOTE — Progress Notes (Signed)
Oncology Nurse Navigator Documentation  Oncology Nurse Navigator Flowsheets 05/25/2017  Navigator Location [facility]  Navigator Encounter Type Telephone/patient called. He is having new onset of cough. I updated Dr. Julien Nordmann. Per Dr. Julien Nordmann, I scheduled a follow up with Mikey Bussing NP to be seen tomorrow.  Patient has no other complaints except tickle in throat and cough.  I updated patient and he verbalized understanding of appt.   Telephone Incoming Call  Treatment Phase Treatment  Barriers/Navigation Needs Coordination of Care  Interventions Coordination of Care  Coordination of Care Appts  Acuity Level 2  Time Spent with Patient 30

## 2017-05-26 ENCOUNTER — Ambulatory Visit (HOSPITAL_BASED_OUTPATIENT_CLINIC_OR_DEPARTMENT_OTHER): Payer: BLUE CROSS/BLUE SHIELD | Admitting: Oncology

## 2017-05-26 ENCOUNTER — Encounter: Payer: Self-pay | Admitting: Oncology

## 2017-05-26 ENCOUNTER — Other Ambulatory Visit (HOSPITAL_BASED_OUTPATIENT_CLINIC_OR_DEPARTMENT_OTHER): Payer: BLUE CROSS/BLUE SHIELD

## 2017-05-26 DIAGNOSIS — J40 Bronchitis, not specified as acute or chronic: Secondary | ICD-10-CM | POA: Diagnosis not present

## 2017-05-26 DIAGNOSIS — C3411 Malignant neoplasm of upper lobe, right bronchus or lung: Secondary | ICD-10-CM

## 2017-05-26 DIAGNOSIS — C7951 Secondary malignant neoplasm of bone: Secondary | ICD-10-CM | POA: Diagnosis not present

## 2017-05-26 DIAGNOSIS — C3491 Malignant neoplasm of unspecified part of right bronchus or lung: Secondary | ICD-10-CM

## 2017-05-26 DIAGNOSIS — C7931 Secondary malignant neoplasm of brain: Secondary | ICD-10-CM | POA: Diagnosis not present

## 2017-05-26 DIAGNOSIS — C787 Secondary malignant neoplasm of liver and intrahepatic bile duct: Secondary | ICD-10-CM | POA: Diagnosis not present

## 2017-05-26 LAB — COMPREHENSIVE METABOLIC PANEL
ALT: 16 U/L (ref 0–55)
ANION GAP: 8 meq/L (ref 3–11)
AST: 27 U/L (ref 5–34)
Albumin: 3.8 g/dL (ref 3.5–5.0)
Alkaline Phosphatase: 133 U/L (ref 40–150)
BILIRUBIN TOTAL: 0.64 mg/dL (ref 0.20–1.20)
BUN: 11.7 mg/dL (ref 7.0–26.0)
CALCIUM: 9.5 mg/dL (ref 8.4–10.4)
CHLORIDE: 106 meq/L (ref 98–109)
CO2: 26 meq/L (ref 22–29)
CREATININE: 1.2 mg/dL (ref 0.7–1.3)
EGFR: 70 mL/min/{1.73_m2} — AB (ref >90)
Glucose: 90 mg/dl (ref 70–140)
Potassium: 4.4 mEq/L (ref 3.5–5.1)
Sodium: 140 mEq/L (ref 136–145)
Total Protein: 7 g/dL (ref 6.4–8.3)

## 2017-05-26 LAB — CBC WITH DIFFERENTIAL/PLATELET
BASO%: 0.4 % (ref 0.0–2.0)
BASOS ABS: 0 10*3/uL (ref 0.0–0.1)
EOS ABS: 0.3 10*3/uL (ref 0.0–0.5)
EOS%: 5.8 % (ref 0.0–7.0)
HEMATOCRIT: 41 % (ref 38.4–49.9)
HGB: 13.8 g/dL (ref 13.0–17.1)
LYMPH#: 0.9 10*3/uL (ref 0.9–3.3)
LYMPH%: 20.3 % (ref 14.0–49.0)
MCH: 29.9 pg (ref 27.2–33.4)
MCHC: 33.7 g/dL (ref 32.0–36.0)
MCV: 88.5 fL (ref 79.3–98.0)
MONO#: 0.3 10*3/uL (ref 0.1–0.9)
MONO%: 8.1 % (ref 0.0–14.0)
NEUT#: 2.8 10*3/uL (ref 1.5–6.5)
NEUT%: 65.4 % (ref 39.0–75.0)
PLATELETS: 146 10*3/uL (ref 140–400)
RBC: 4.63 10*6/uL (ref 4.20–5.82)
RDW: 14.6 % (ref 11.0–14.6)
WBC: 4.3 10*3/uL (ref 4.0–10.3)

## 2017-05-26 MED ORDER — AZITHROMYCIN 250 MG PO TABS: ORAL_TABLET | ORAL | 0 refills | Status: DC

## 2017-05-26 MED ORDER — BENZONATATE 100 MG PO CAPS: 100.0000 mg | ORAL_CAPSULE | Freq: Three times a day (TID) | ORAL | 0 refills | Status: DC | PRN

## 2017-05-26 NOTE — Assessment & Plan Note (Signed)
This is a 51 year old gentleman with [name]. He is currently on treatment with Tagrisso 80 mg daily. Tolerating treatment well. He presents today with increased cough without hemoptysis, sinus congestion and a left ear effusion.   Patient was seen with Dr. Julien Nordmann. We will prescribe a Z-Pak. He was also given a protrusion for Tessalon Perles to be used as needed for cough. He was instructed to use cough drops as needed.  Reassured patient that this is likely an infection as opposed to disease progression. The patient has restaging CT scans ordered for next month with follow-up thereafter. We will keep these as scheduled. Message has been sent to managed care to complete the prior authorization so that the scans can be scheduled.    The patient was instructed to call us if his symptoms worsen, or if he develops hemoptysis or increasing shortness of breath.

## 2017-05-26 NOTE — Progress Notes (Signed)
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 He had disease progression and development of EGFR T790M resistant mutation in April 2017.  SUMMARY OF ONCOLOGIC HISTORY:  No history exists.   PRIOR THERAPY: 1) Status post whole brain irradiation under the care Oscar Floyd completed 04/12/2014. 2) Gilotrif 40 mg po daily - therapy beginning 04/03/2014. Status post approximately 20 months of therapy discontinued secondary to disease progression and development of EGFR T790M resistant mutation.  3) palliative radiotherapy to the lytic lesion in the right clavicle.  CURRENT THERAPY: 1) Tagrisso 80 mg by mouth daily status post 15 months of treatment. First dose was given on 01/12/2016. 2) Xgeva 120 g subcutaneously on monthly basis.  INTERVAL HISTORY: Oscar Floyd 51 y.o. male returns for a work in visit. The patient contacted our office reporting a cough. Patient tells me that he has had his symptoms are approximately one week. He reports nasal drainage going to the back of his throat and a sensation of a "tickle" in his chest. His cough has been nonproductive. He denies hemoptysis. Denies fevers and chills. Reports a popping sensation in his left ear. Denies abdominal pain, nausea, vomiting. The patient is here for evaluation of his symptoms.   Patient Active Problem List   Diagnosis Date Noted  . Bronchitis  05/26/2017  . Sinusitis 12/21/2016  . Genetic testing 07/07/2016  . Family history of cancer   . Sinus congestion 05/24/2016  . Pharyngitis 01/13/2016  . Encounter for antineoplastic chemotherapy 01/06/2016  . Bone metastases ([place]) 08/26/2015  . Weight loss, abnormal 07/04/2014  . Drug-induced diarrhea 07/04/2014  . Drug-induced skin rash 07/04/2014  . Paronychia of finger 07/04/2014  . 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 Non-small cell cancer of right lung ([place]) 03/25/2014  . Cough 03/11/2014  . Acid reflux 11/28/2013  . Blood pressure elevated 10/20/2012    has No Known Allergies.  MEDICAL HISTORY: Past Medical History:  Diagnosis Date  . Bone metastases ([place]) 08/26/2015  . Encounter for antineoplastic chemotherapy 01/06/2016  . Family history of cancer   . Hypertension   . Lung cancer ([place])    RUL lung with mets to liver, bone and brain    SURGICAL HISTORY: Past Surgical History:  Procedure Laterality Date  . VASECTOMY    . VIDEO BRONCHOSCOPY Bilateral 03/20/2014   Procedure: VIDEO BRONCHOSCOPY WITH FLUORO;  Surgeon: Oscar Noel, MD;  Location: [facility];  Service: Cardiopulmonary;  Laterality: Bilateral;    SOCIAL HISTORY: Social History   Social History  . Marital status: Married    Spouse name: N/A  . Number of children: 2  . Years of education: N/A   Occupational History  . Not on file.   Social History Main Topics  . Smoking status: Never Smoker  . Smokeless tobacco: Never Used  . Alcohol use Yes     Comment: socially  . Drug use: No  . Sexual activity:  Yes   Other Topics Concern  . Not on file   Social History Narrative  . No narrative on file    FAMILY HISTORY: Family History  Problem Relation Age of Onset  . Cancer Father 58       liposarcoma age 83  . Cancer Mother 83       gist  . Cancer Paternal Grandfather        lung cancer; heavy smoker; also had abdominal cancer in 39's  . Stroke Maternal Grandmother    . Heart disease Maternal Grandfather   . Cancer Other        multiple brothers of PGF with unknown cancer    Review of Systems  Constitutional: Negative for appetite change, chills, fatigue, fever and unexpected weight change.  HENT:   Negative for sore throat.        Popping sensation to left ear. Denies facial pain.  Eyes: Negative.   Respiratory: Positive for cough. Negative for hemoptysis, shortness of breath and wheezing.   Cardiovascular: Negative.   Gastrointestinal: Negative.   Genitourinary: Negative.    Musculoskeletal: Negative.   Skin: Negative.   Neurological: Negative.   Hematological: Negative.   Psychiatric/Behavioral: Negative.       PHYSICAL EXAMINATION  ECOG PERFORMANCE STATUS: 0 - Asymptomatic  Vitals:   05/26/17 1346  BP: (!) 125/92  Pulse: 67  Resp: 18  Temp: 98.9 F (37.2 C)  SpO2: 100%    Physical Exam  Constitutional: He is oriented to person, place, and time and well-developed, well-nourished, and in no distress. No distress.  HENT:  Head: Normocephalic.  Right Ear: No drainage, swelling or tenderness. Tympanic membrane is not erythematous. No middle ear effusion.  Left Ear: No drainage, swelling or tenderness. Tympanic membrane is not erythematous. A middle ear effusion is present.  Mouth/Throat: Oropharynx is clear and moist. No oropharyngeal exudate.  Eyes: Conjunctivae are normal. Right eye exhibits no discharge. Left eye exhibits no discharge. No scleral icterus.  Neck: Normal range of motion. Neck supple.  Cardiovascular: Normal rate, regular rhythm, normal heart sounds and intact distal pulses.   Pulmonary/Chest: Effort normal and breath sounds normal. No respiratory distress. He has no wheezes. He has no rales.  Abdominal: Soft. Bowel sounds are normal. He exhibits no distension and no mass. There is no tenderness.  Musculoskeletal: Normal range of motion. He exhibits no edema.  Lymphadenopathy:    He has no cervical adenopathy.   Neurological: He is alert and oriented to person, place, and time. He exhibits normal muscle tone.  Skin: Skin is warm and dry. No rash noted. He is not diaphoretic. No erythema. No pallor.  Psychiatric: Mood, memory, affect and judgment normal.  Vitals reviewed.   LABORATORY DATA:  CBC    Component Value Date/Time   WBC 4.3 05/26/2017 1336   RBC 4.63 05/26/2017 1336   HGB 13.8 05/26/2017 1336   HCT 41.0 05/26/2017 1336   PLT 146 05/26/2017 1336   MCV 88.5 05/26/2017 1336   MCH 29.9 05/26/2017 1336   MCHC 33.7 05/26/2017 1336   RDW 14.6 05/26/2017 1336   LYMPHSABS 0.9 05/26/2017 1336   MONOABS 0.3 05/26/2017 1336   EOSABS 0.3 05/26/2017 1336   BASOSABS 0.0 05/26/2017 1336    CMP     Component Value Date/Time   NA 140 05/26/2017 1336   K 4.4 05/26/2017 1336   CO2 26 05/26/2017 1336   GLUCOSE 90 05/26/2017 1336   BUN 11.7 05/26/2017 1336   CREATININE  1.2 05/26/2017 1336   CALCIUM 9.5 05/26/2017 1336   PROT 7.0 05/26/2017 1336   ALBUMIN 3.8 05/26/2017 1336   AST 27 05/26/2017 1336   ALT 16 05/26/2017 1336   ALKPHOS 133 05/26/2017 1336   BILITOT 0.64 05/26/2017 1336   RADIOGRAPHIC STUDIES:  No results found.  ASSESSMENT and THERAPY PLAN:   Bronchitis This is a 51 year old gentleman with [name]. He is currently on treatment with Tagrisso 80 mg daily. Tolerating treatment well. He presents today with increased cough without hemoptysis, sinus congestion and a left ear effusion.   Patient was seen with Dr. Julien Floyd. We will prescribe a Z-Pak. He was also given a protrusion for Tessalon Perles to be used as needed for cough. He was instructed to use cough drops as needed.  Reassured patient that this is likely an infection as opposed to disease progression. The patient has restaging CT scans ordered for next month with follow-up thereafter. We will keep these as scheduled. Message has been sent to managed care to complete the prior  authorization so that the scans can be scheduled.    The patient was instructed to call us if his symptoms worsen, or if he develops hemoptysis or increasing shortness of breath.   No orders of the defined types were placed in this encounter.   All questions were answered. The patient knows to call the clinic with any problems, questions or concerns. We can certainly see the patient much sooner if necessary.  Oscar Bussing, NP 05/26/2017   ADDENDUM: Hematology/Oncology Attending: I had a face to face encounter with the patient. I recommended his care plan. This is a very pleasant 51 years old white male with a [name], adenocarcinoma. He is currently undergoing treatment with targeted therapy was Tagrisso 80 mg by mouth daily and has been tolerating this treatment fairly well with no specific complaints. He presented today for evaluation because of recent increased cough as well as sore throat and nasal congestion. The symptoms are likely secondary to low and mild bronchitis. We recommend for the patient to start treatment with Z-Pak as well as Tessalon Perles. He is scheduled to have repeat CT scan of the chest, abdomen and pelvis in few weeks but if no improvement in his condition will be happy to schedule the scan earlier to rule out any disease progression. The patient agreed to the current plan. He was advised to call immediately if he has any other concerning symptoms in the interval. Disclaimer: This note was dictated with voice recognition software. Similar sounding words can inadvertently be transcribed and may be missed upon review. Oscar Kempf, MD 05/28/17

## 2017-06-08 ENCOUNTER — Encounter: Payer: Self-pay | Admitting: *Deleted

## 2017-06-08 ENCOUNTER — Telehealth: Payer: Self-pay | Admitting: Medical Oncology

## 2017-06-08 NOTE — Telephone Encounter (Signed)
Ok to scan him sooner and follow up visit after the scan

## 2017-06-08 NOTE — Telephone Encounter (Signed)
Completed z pack apprx a week ago without any improvement in symptoms-cough and sob. . Can he move up his CT scan from 10/15 to sooner?Marland Kitchen Note to [place].

## 2017-06-08 NOTE — Progress Notes (Signed)
[place] Social Work  Clinical Social Work was referred by navigator to review and complete healthcare advance directives.  Clinical Social Worker met with patient in [place] office.  The patient designated spouse, [name], as their primary healthcare agent and mother, [name], as their secondary agent.  Patient also completed healthcare living will.  Patient does not desire life prolonging measures in the situations indicated in living will.  Clinical Social Worker notarized documents and made copies for patient/family. Clinical Social Worker will send documents to medical records to be scanned into patient's chart. Clinical Social Worker encouraged patient/family to contact with any additional questions or concerns.  Maryjean Morn, MSW, LCSW Clinical Social Worker [facility] [phone]

## 2017-06-09 ENCOUNTER — Other Ambulatory Visit: Payer: Self-pay | Admitting: Medical Oncology

## 2017-06-13 ENCOUNTER — Telehealth: Payer: Self-pay | Admitting: *Deleted

## 2017-06-13 NOTE — Telephone Encounter (Signed)
Oncology Nurse Navigator Documentation  Oncology Nurse Navigator Flowsheets 06/13/2017  Navigator Location [facility]  Navigator Encounter Type Telephone/I received a call from Mr. Hall Busing. I called him back. He is complaining of cough.  He states he feels a little better.  He also states his scan is scheduled for tomorrow. He has an appt with Dr. Julien Nordmann in 2 weeks.   Telephone Outgoing Call;Incoming Call  Treatment Phase Treatment  Barriers/Navigation Needs Education  Education Other  Interventions Education  Education Method Verbal  Acuity Level 1  Time Spent with Patient 15

## 2017-06-14 ENCOUNTER — Ambulatory Visit (HOSPITAL_COMMUNITY)
Admission: RE | Admit: 2017-06-14 | Discharge: 2017-06-14 | Disposition: A | Discharge status: Home / Self Care | Payer: BLUE CROSS/BLUE SHIELD | Source: Ambulatory Visit | Attending: Internal Medicine | Admitting: Internal Medicine

## 2017-06-14 DIAGNOSIS — C3491 Malignant neoplasm of unspecified part of right bronchus or lung: Secondary | ICD-10-CM | POA: Diagnosis present

## 2017-06-14 DIAGNOSIS — Z5111 Encounter for antineoplastic chemotherapy: Secondary | ICD-10-CM

## 2017-06-14 DIAGNOSIS — C7931 Secondary malignant neoplasm of brain: Secondary | ICD-10-CM

## 2017-06-14 DIAGNOSIS — R918 Other nonspecific abnormal finding of lung field: Secondary | ICD-10-CM | POA: Insufficient documentation

## 2017-06-14 DIAGNOSIS — C7949 Secondary malignant neoplasm of other parts of nervous system: Secondary | ICD-10-CM | POA: Insufficient documentation

## 2017-06-14 DIAGNOSIS — C7951 Secondary malignant neoplasm of bone: Secondary | ICD-10-CM | POA: Diagnosis not present

## 2017-06-14 MED ORDER — IOPAMIDOL (ISOVUE-300) INJECTION 61%: 15.0000 mL | Freq: Two times a day (BID) | INTRAVENOUS | Status: DC | PRN

## 2017-06-14 MED ORDER — IOPAMIDOL (ISOVUE-300) INJECTION 61%
INTRAVENOUS | Status: AC
  Administered 2017-06-14: 30 mL via ORAL
  Filled 2017-06-14: qty 30

## 2017-06-14 MED ORDER — IOPAMIDOL (ISOVUE-300) INJECTION 61%
INTRAVENOUS | Status: AC
  Filled 2017-06-14: qty 100

## 2017-06-14 MED ORDER — IOPAMIDOL (ISOVUE-300) INJECTION 61%
100.0000 mL | Freq: Once | INTRAVENOUS | Status: AC | PRN
  Administered 2017-06-14: 100 mL via INTRAVENOUS

## 2017-06-15 ENCOUNTER — Other Ambulatory Visit: Payer: Self-pay | Admitting: *Deleted

## 2017-06-15 ENCOUNTER — Encounter: Payer: Self-pay | Admitting: *Deleted

## 2017-06-15 MED ORDER — LEVOFLOXACIN 500 MG PO TABS: 500.0000 mg | ORAL_TABLET | Freq: Every day | ORAL | 0 refills | Status: AC

## 2017-06-15 NOTE — Telephone Encounter (Signed)
Oncology Nurse Navigator Documentation  Oncology Nurse Navigator Flowsheets 06/15/2017  Navigator Location [facility]  Navigator Encounter Type Telephone;Other/I updated Dr. Julien Nordmann on recent Ct scan. He stated to call Oscar Floyd with an update. I did.  Also, per Dr. Julien Nordmann, patient needs Leviquin 7 days. I completed order and called patient. Oscar Floyd was thankful for the call and verbalized understanding of new medication.   Telephone Outgoing Call  Treatment Phase Treatment  Barriers/Navigation Needs Coordination of Care;Education  Education Other  Interventions Education;Coordination of Care  Coordination of Care Other  Education Method Verbal  Acuity Level 3  Acuity Level 3 Other  Time Spent with Patient 120

## 2017-06-17 ENCOUNTER — Telehealth: Payer: Self-pay | Admitting: *Deleted

## 2017-06-17 NOTE — Telephone Encounter (Signed)
Oncology Nurse Navigator Documentation  Oncology Nurse Navigator Flowsheets 06/17/2017  Navigator Location [facility]  Navigator Encounter Type Telephone/I called to follow up with Oscar Floyd today.  I asked him about his antibiotics and how he was feeling. He states no change in cough but tolerating medication ok. I listened as he explained. I asked that he call us if he gets a fever, chill, diarrhea, or other symptoms he was concerned about. He stated he would. He was thankful for the call and checking on him.   Telephone Outgoing Call  Treatment Phase Treatment  Barriers/Navigation Needs Education  Education Other  Interventions Education  Education Method Verbal  Acuity Level 1  Acuity Level 1 Minimal follow up required  Time Spent with Patient 15

## 2017-06-20 ENCOUNTER — Other Ambulatory Visit: Payer: Self-pay | Admitting: Radiation Therapy

## 2017-06-20 DIAGNOSIS — C7949 Secondary malignant neoplasm of other parts of nervous system: Principal | ICD-10-CM

## 2017-06-20 DIAGNOSIS — C7931 Secondary malignant neoplasm of brain: Secondary | ICD-10-CM

## 2017-06-27 ENCOUNTER — Other Ambulatory Visit (HOSPITAL_BASED_OUTPATIENT_CLINIC_OR_DEPARTMENT_OTHER): Payer: BLUE CROSS/BLUE SHIELD

## 2017-06-27 DIAGNOSIS — C7931 Secondary malignant neoplasm of brain: Secondary | ICD-10-CM

## 2017-06-27 DIAGNOSIS — Z5111 Encounter for antineoplastic chemotherapy: Secondary | ICD-10-CM

## 2017-06-27 DIAGNOSIS — C3491 Malignant neoplasm of unspecified part of right bronchus or lung: Secondary | ICD-10-CM

## 2017-06-27 DIAGNOSIS — C7949 Secondary malignant neoplasm of other parts of nervous system: Secondary | ICD-10-CM

## 2017-06-27 DIAGNOSIS — C7951 Secondary malignant neoplasm of bone: Secondary | ICD-10-CM

## 2017-06-27 LAB — CBC WITH DIFFERENTIAL/PLATELET
BASO%: 0.3 % (ref 0.0–2.0)
BASOS ABS: 0 10*3/uL (ref 0.0–0.1)
EOS ABS: 0.2 10*3/uL (ref 0.0–0.5)
EOS%: 5.4 % (ref 0.0–7.0)
HEMATOCRIT: 39.5 % (ref 38.4–49.9)
HGB: 13.3 g/dL (ref 13.0–17.1)
LYMPH#: 0.7 10*3/uL — AB (ref 0.9–3.3)
LYMPH%: 23.5 % (ref 14.0–49.0)
MCH: 30.2 pg (ref 27.2–33.4)
MCHC: 33.7 g/dL (ref 32.0–36.0)
MCV: 89.6 fL (ref 79.3–98.0)
MONO#: 0.3 10*3/uL (ref 0.1–0.9)
MONO%: 9.5 % (ref 0.0–14.0)
NEUT#: 1.9 10*3/uL (ref 1.5–6.5)
NEUT%: 61.3 % (ref 39.0–75.0)
PLATELETS: 131 10*3/uL — AB (ref 140–400)
RBC: 4.41 10*6/uL (ref 4.20–5.82)
RDW: 14.1 % (ref 11.0–14.6)
WBC: 3.2 10*3/uL — ABNORMAL LOW (ref 4.0–10.3)

## 2017-06-27 LAB — COMPREHENSIVE METABOLIC PANEL
ALT: 19 U/L (ref 0–55)
ANION GAP: 8 meq/L (ref 3–11)
AST: 49 U/L — ABNORMAL HIGH (ref 5–34)
Albumin: 3.4 g/dL — ABNORMAL LOW (ref 3.5–5.0)
Alkaline Phosphatase: 112 U/L (ref 40–150)
BUN: 16.8 mg/dL (ref 7.0–26.0)
CALCIUM: 8.9 mg/dL (ref 8.4–10.4)
CHLORIDE: 112 meq/L — AB (ref 98–109)
CO2: 21 mEq/L — ABNORMAL LOW (ref 22–29)
CREATININE: 1.1 mg/dL (ref 0.7–1.3)
Glucose: 89 mg/dl (ref 70–140)
POTASSIUM: 4 meq/L (ref 3.5–5.1)
Sodium: 140 mEq/L (ref 136–145)
Total Bilirubin: 0.71 mg/dL (ref 0.20–1.20)
Total Protein: 6.1 g/dL — ABNORMAL LOW (ref 6.4–8.3)

## 2017-06-28 ENCOUNTER — Telehealth: Payer: Self-pay

## 2017-06-28 ENCOUNTER — Encounter: Payer: Self-pay | Admitting: Internal Medicine

## 2017-06-28 ENCOUNTER — Ambulatory Visit (HOSPITAL_BASED_OUTPATIENT_CLINIC_OR_DEPARTMENT_OTHER): Payer: BLUE CROSS/BLUE SHIELD | Admitting: Internal Medicine

## 2017-06-28 VITALS — BP 143/83 | HR 127 | Temp 98.6°F | Resp 18 | Ht 74.0 in | Wt 210.1 lb

## 2017-06-28 DIAGNOSIS — C7931 Secondary malignant neoplasm of brain: Secondary | ICD-10-CM | POA: Diagnosis not present

## 2017-06-28 DIAGNOSIS — C787 Secondary malignant neoplasm of liver and intrahepatic bile duct: Secondary | ICD-10-CM

## 2017-06-28 DIAGNOSIS — C3411 Malignant neoplasm of upper lobe, right bronchus or lung: Secondary | ICD-10-CM

## 2017-06-28 DIAGNOSIS — C7949 Secondary malignant neoplasm of other parts of nervous system: Secondary | ICD-10-CM

## 2017-06-28 DIAGNOSIS — Z5111 Encounter for antineoplastic chemotherapy: Secondary | ICD-10-CM

## 2017-06-28 DIAGNOSIS — J189 Pneumonia, unspecified organism: Secondary | ICD-10-CM

## 2017-06-28 DIAGNOSIS — C3491 Malignant neoplasm of unspecified part of right bronchus or lung: Secondary | ICD-10-CM

## 2017-06-28 DIAGNOSIS — C7951 Secondary malignant neoplasm of bone: Secondary | ICD-10-CM | POA: Diagnosis not present

## 2017-06-28 MED ORDER — METHYLPREDNISOLONE 4 MG PO TBPK: ORAL_TABLET | ORAL | 0 refills | Status: DC

## 2017-06-28 MED ORDER — DOXYCYCLINE HYCLATE 100 MG PO TABS: 100.0000 mg | ORAL_TABLET | Freq: Two times a day (BID) | ORAL | 0 refills | Status: DC

## 2017-06-28 NOTE — Progress Notes (Signed)
[place] [phone]   [phone]  OFFICE PROGRESS NOTE  [name], MD [place] [id number]  DIAGNOSIS: stage IV (T2a, N0, M1b) non-small cell lung cancer consistent with poorly differentiated adenocarcinoma with positive EGFR mutation with deletion in exon 19, diagnosed in July of 2015 and presented with right upper lobe lung mass in addition to extensive liver, brain and bone metastases.  He had disease progression and development of EGFR T790M resistant mutation in April 2017.  PRIOR THERAPY: 1) Status post whole brain irradiation under the care Dr. Tammi Klippel completed 04/12/2014. 2) Gilotrif 40 mg po daily - therapy beginning 04/03/2014. Status post approximately 20 months of therapy discontinued secondary to disease progression and development of EGFR T790M resistant mutation.  3) palliative radiotherapy to the lytic lesion in the right clavicle.  CURRENT THERAPY: 1) Tagrisso 80 mg by mouth daily status post 17 months of treatment. First dose was given on 01/12/2016. 2) Xgeva 120 g subcutaneously on monthly basis.  INTERVAL HISTORY: Oscar Floyd 51 y.o. male returns to the clinic today for follow-up visit. The patient has been complaining of increasing shortness breath and cough recently. He was treated with 2 courses of antibiotics with Z-Pak followed by Levaquin but continues to have shortness of breath. He was evaluated with repeat CT scan of the chest, abdomen and pelvis recently that showed pneumonia in the left lower lobe. He'll receive the treatment with Levaquin after the scan. He continues to have cough productive of yellowish sputum. He denied having any hemoptysis. He denied having any recent weight loss or night sweats. He has no nausea, vomiting, diarrhea or constipation. He also has postnasal drainage. The patient is tolerating his treatment with Tagrisso fairly well with no significant skin rash or  diarrhea. He is here today for evaluation and discussion of his scan results and treatment options.   MEDICAL HISTORY: Past Medical History:  Diagnosis Date  . Bone metastases ([place]) 08/26/2015  . Encounter for antineoplastic chemotherapy 01/06/2016  . Family history of cancer   . Hypertension   . Lung cancer ([place])    RUL lung with mets to liver, bone and brain    ALLERGIES:  has No Known Allergies.  MEDICATIONS:  Current Outpatient Prescriptions  Medication Sig Dispense Refill  . ALPRAZolam (XANAX) 1 MG tablet Reported on 02/11/2016    . azithromycin (ZITHROMAX Z-PAK) 250 MG tablet Take 2 tabs today then 1 tab daily until complete 6 each 0  . benzonatate (TESSALON) 100 MG capsule Take 1 capsule (100 mg total) by mouth 3 (three) times daily as needed for cough. 30 capsule 0  . calcium-vitamin D (OSCAL) 250-125 MG-UNIT per tablet Take 1 tablet by mouth daily. Reported on 01/12/2016    . diazepam (VALIUM) 10 MG tablet Take 1 tablet (10 mg total) by mouth as directed. 30 min before MRI 15 tablet 0  . esomeprazole (NEXIUM) 20 MG capsule Take 20 mg by mouth 2 (two) times daily before a meal.     . loperamide (IMODIUM) 2 MG capsule Take 2 mg by mouth as needed for diarrhea or loose stools.    . mirtazapine (REMERON) 30 MG tablet Take 1 tablet (30 mg total) by mouth at bedtime. 30 tablet 2  . naproxen sodium (ANAPROX) 220 MG tablet Take 220 mg by mouth as needed.    Marland Kitchen osimertinib mesylate (TAGRISSO) 80 MG tablet Take 1 tablet (80 mg total) by mouth daily. [place]  tablet 2   No current facility-administered medications for this visit.     SURGICAL HISTORY:  Past Surgical History:  Procedure Laterality Date  . VASECTOMY    . VIDEO BRONCHOSCOPY Bilateral 03/20/2014   Procedure: VIDEO BRONCHOSCOPY WITH FLUORO;  Surgeon: Rigoberto Noel, MD;  Location: [facility];  Service: Cardiopulmonary;  Laterality: Bilateral;    REVIEW OF SYSTEMS:  Constitutional: positive for fatigue Eyes: negative Ears,  nose, mouth, throat, and face: negative Respiratory: positive for cough, dyspnea on exertion and sputum Cardiovascular: negative Gastrointestinal: negative Genitourinary:negative Integument/breast: negative Hematologic/lymphatic: negative Musculoskeletal:negative Neurological: negative Behavioral/Psych: negative Endocrine: negative Allergic/Immunologic: negative   PHYSICAL EXAMINATION: General appearance: alert, cooperative, appears stated age and no distress Head: Normocephalic, without obvious abnormality, atraumatic Neck: no adenopathy, no JVD, supple, symmetrical, trachea midline and thyroid not enlarged, symmetric, no tenderness/mass/nodules Lymph nodes: Cervical, supraclavicular, and axillary nodes normal. Resp: clear to auscultation bilaterally Back: symmetric, no curvature. ROM normal. No CVA tenderness. Cardio: regular rate and rhythm, S1, S2 normal, no murmur, click, rub or gallop GI: soft, non-tender; bowel sounds normal; no masses,  no organomegaly Extremities: extremities normal, atraumatic, no cyanosis or edema Neurologic: Alert and oriented X 3, normal strength and tone. Normal symmetric reflexes. Normal coordination and gait  ECOG PERFORMANCE STATUS: 1 - Symptomatic but completely ambulatory  Blood pressure (!) 143/83, pulse (!) 127, temperature 98.6 F (37 C), resp. rate 18, height 6' 2"  (1.88 m), weight 210 lb 1.6 oz (95.3 kg), SpO2 100 %.  LABORATORY DATA: Lab Results  Component Value Date   WBC 3.2 (L) 06/27/2017   HGB 13.3 06/27/2017   HCT 39.5 06/27/2017   MCV 89.6 06/27/2017   PLT 131 (L) 06/27/2017      Chemistry      Component Value Date/Time   NA 140 06/27/2017 0843   K 4.0 06/27/2017 0843   CO2 21 (L) 06/27/2017 0843   BUN 16.8 06/27/2017 0843   CREATININE 1.1 06/27/2017 0843      Component Value Date/Time   CALCIUM 8.9 06/27/2017 0843   ALKPHOS 112 06/27/2017 0843   AST 49 (H) 06/27/2017 0843   ALT 19 06/27/2017 0843   BILITOT 0.71  06/27/2017 0843       RADIOGRAPHIC STUDIES: Ct Chest W Contrast  Result Date: 06/15/2017 CLINICAL DATA:  51 year old male with history of metastatic non-small cell lung cancer (adenocarcinoma). Undergoing chemotherapy. Followup study. EXAM: CT CHEST, ABDOMEN, AND PELVIS WITH CONTRAST TECHNIQUE: Multidetector CT imaging of the chest, abdomen and pelvis was performed following the standard protocol during bolus administration of intravenous contrast. CONTRAST:  129m ISOVUE-300 IOPAMIDOL (ISOVUE-300) INJECTION 61% COMPARISON:  CT of the chest, abdomen and pelvis 02/21/2017. FINDINGS: CT CHEST FINDINGS Cardiovascular: Heart size is normal. There is no significant pericardial fluid, thickening or pericardial calcification. No significant atherosclerotic disease, aneurysm or dissection noted in the thoracic vasculature. No coronary artery calcifications. Mediastinum/Nodes: No pathologically enlarged mediastinal or hilar lymph nodes. Esophagus is unremarkable in appearance. No axillary lymphadenopathy. Lungs/Pleura: The primary nodule in the inferior aspect of the right upper lobe is similar in size to the prior examination measuring 13 x 18 mm on today's study (axial image 72 of series 6). Several other small pulmonary nodules are noted elsewhere throughout the lungs, many of these are stable compared to the prior examination. A few nodules do appear slightly larger, most notable for a a 7 mm nodule in the superior segment of the right lower lobe (axial image 79 of series 6) which previously measured  only 4 mm on prior study 02/21/2017. In addition, there is extensive airspace consolidation throughout the periphery of the left lower lobe which is new compared to the prior study, concerning for acute infection. No pleural effusions. Musculoskeletal: Widespread predominantly sclerotic lesions are again noted throughout all aspects of the visualized axial and appendicular skeleton, indicative of widespread  metastatic disease to the bones. The overall burden of metastatic disease to the bones appears very similar to prior examinations, although some lesions do appear slightly more sclerotic than the prior study (most evident in the medial aspect of the right clavicle), suggesting some healing of pre-existing lesions. CT ABDOMEN PELVIS FINDINGS Hepatobiliary: No cystic or solid hepatic lesions. No intra or extrahepatic biliary ductal dilatation. Gallbladder is normal in appearance. Pancreas: No pancreatic mass. No pancreatic ductal dilatation. No pancreatic or peripancreatic fluid or inflammatory changes. Spleen: Unremarkable. Adrenals/Urinary Tract: Subcentimeter low-attenuation lesion in the interpolar region of the right kidney is too small to definitively characterize, but is statistically likely a tiny cyst. Left kidney and bilateral adrenal glands are normal in appearance. No hydroureteronephrosis. Urinary bladder is normal in appearance. Stomach/Bowel: Normal appearance of the stomach. No pathologic dilatation of small bowel or colon. Normal appendix. Vascular/Lymphatic: No significant atherosclerotic disease, aneurysm or dissection noted in the abdominal or pelvic vasculature. No lymphadenopathy noted in the abdomen or pelvis. Some amorphous soft tissue is seen adjacent to the proximal abdominal aorta in the region of the origin of the celiac axis, extending adjacent to the diaphragmatic crus bilaterally, nonspecific and similar to the prior study. Reproductive: Prostate gland and seminal vesicles are unremarkable in appearance. Other: No significant volume of ascites.  No pneumoperitoneum. Musculoskeletal: Multiple sclerotic lesions are again noted throughout the visualized axial and appendicular skeleton, very similar to prior examinations, compatible with widespread metastatic disease to the bones. IMPRESSION: 1. New area of airspace consolidation in the left lower lobe concerning for left lower lobe  pneumonia. 2. The primary lesion in the inferior aspect of the right upper lobe is similar in size to the prior examination. Most previously noted small pulmonary nodules are stable compared to the prior study, with a few exceptions which have shown minimal growth. Widespread metastatic disease to the bones is also similar compared to prior examinations. No new sites of metastatic disease are noted on today's study in the abdomen or pelvis. 3. Additional incidental findings, as above. Electronically Signed   By: Vinnie Langton M.D.   On: 06/15/2017 09:00   Ct Abdomen Pelvis W Contrast  Result Date: 06/15/2017 CLINICAL DATA:  51 year old male with history of metastatic non-small cell lung cancer (adenocarcinoma). Undergoing chemotherapy. Followup study. EXAM: CT CHEST, ABDOMEN, AND PELVIS WITH CONTRAST TECHNIQUE: Multidetector CT imaging of the chest, abdomen and pelvis was performed following the standard protocol during bolus administration of intravenous contrast. CONTRAST:  154m ISOVUE-300 IOPAMIDOL (ISOVUE-300) INJECTION 61% COMPARISON:  CT of the chest, abdomen and pelvis 02/21/2017. FINDINGS: CT CHEST FINDINGS Cardiovascular: Heart size is normal. There is no significant pericardial fluid, thickening or pericardial calcification. No significant atherosclerotic disease, aneurysm or dissection noted in the thoracic vasculature. No coronary artery calcifications. Mediastinum/Nodes: No pathologically enlarged mediastinal or hilar lymph nodes. Esophagus is unremarkable in appearance. No axillary lymphadenopathy. Lungs/Pleura: The primary nodule in the inferior aspect of the right upper lobe is similar in size to the prior examination measuring 13 x 18 mm on today's study (axial image 72 of series 6). Several other small pulmonary nodules are noted elsewhere throughout the lungs, many  of these are stable compared to the prior examination. A few nodules do appear slightly larger, most notable for a a 7 mm  nodule in the superior segment of the right lower lobe (axial image 79 of series 6) which previously measured only 4 mm on prior study 02/21/2017. In addition, there is extensive airspace consolidation throughout the periphery of the left lower lobe which is new compared to the prior study, concerning for acute infection. No pleural effusions. Musculoskeletal: Widespread predominantly sclerotic lesions are again noted throughout all aspects of the visualized axial and appendicular skeleton, indicative of widespread metastatic disease to the bones. The overall burden of metastatic disease to the bones appears very similar to prior examinations, although some lesions do appear slightly more sclerotic than the prior study (most evident in the medial aspect of the right clavicle), suggesting some healing of pre-existing lesions. CT ABDOMEN PELVIS FINDINGS Hepatobiliary: No cystic or solid hepatic lesions. No intra or extrahepatic biliary ductal dilatation. Gallbladder is normal in appearance. Pancreas: No pancreatic mass. No pancreatic ductal dilatation. No pancreatic or peripancreatic fluid or inflammatory changes. Spleen: Unremarkable. Adrenals/Urinary Tract: Subcentimeter low-attenuation lesion in the interpolar region of the right kidney is too small to definitively characterize, but is statistically likely a tiny cyst. Left kidney and bilateral adrenal glands are normal in appearance. No hydroureteronephrosis. Urinary bladder is normal in appearance. Stomach/Bowel: Normal appearance of the stomach. No pathologic dilatation of small bowel or colon. Normal appendix. Vascular/Lymphatic: No significant atherosclerotic disease, aneurysm or dissection noted in the abdominal or pelvic vasculature. No lymphadenopathy noted in the abdomen or pelvis. Some amorphous soft tissue is seen adjacent to the proximal abdominal aorta in the region of the origin of the celiac axis, extending adjacent to the diaphragmatic crus  bilaterally, nonspecific and similar to the prior study. Reproductive: Prostate gland and seminal vesicles are unremarkable in appearance. Other: No significant volume of ascites.  No pneumoperitoneum. Musculoskeletal: Multiple sclerotic lesions are again noted throughout the visualized axial and appendicular skeleton, very similar to prior examinations, compatible with widespread metastatic disease to the bones. IMPRESSION: 1. New area of airspace consolidation in the left lower lobe concerning for left lower lobe pneumonia. 2. The primary lesion in the inferior aspect of the right upper lobe is similar in size to the prior examination. Most previously noted small pulmonary nodules are stable compared to the prior study, with a few exceptions which have shown minimal growth. Widespread metastatic disease to the bones is also similar compared to prior examinations. No new sites of metastatic disease are noted on today's study in the abdomen or pelvis. 3. Additional incidental findings, as above. Electronically Signed   By: Vinnie Langton M.D.   On: 06/15/2017 09:00    ASSESSMENT AND PLAN: This is a very pleasant 51 years old white male with metastatic non-small cell lung cancer, adenocarcinoma with positive EGFR mutation with deletion in the 19 status post treatment with Gilotrif for 20 months discontinued secondary to disease progression and development of EGFR T790M resistant mutation. The patient is currently on treatment with Tagrisso 80 mg by mouth daily status post 17 months. The patient continues to tolerate his treatment with Tagrisso fairly well. He had repeat CT scan of the chest, abdomen and pelvis performed recently. I personally and independently reviewed the scan images and discuss the results and showed the images to the patient today. His scan showed new area first consolidation in the left lower lobe concerning for left lower lobe pneumonia that there is  no concerning finding for disease  progression on the scan. I recommended for the patient to continue his current treatment with Tagrisso with the same dose. For the residual pneumonia, I will start the patient on doxycycline 100 mg by mouth twice a day for 10 days and are also start him on Medrol Dosepak because of the persistent cough and shortness of breath. For the metastatic bone disease, he will continue on Xgeva. I will see the patient back for follow-up visit in one month's for reevaluation and management of any adverse effect of his treatment. He was advised to call immediately if he has any concerning symptoms in the interval. The patient voices understanding of current disease status and treatment options and is in agreement with the current care plan. All questions were answered. The patient knows to call the clinic with any problems, questions or concerns. We can certainly see the patient much sooner if necessary.  Disclaimer: This note was dictated with voice recognition software. Similar sounding words can inadvertently be transcribed and may not be corrected upon review.

## 2017-06-28 NOTE — Telephone Encounter (Signed)
Printd avs and calender for upcoming appointment per 10/16 los

## 2017-07-08 NOTE — Progress Notes (Addendum)
Oscar Floyd 51 yo man with EGFR positive [name] of the right upper lobe with painful right clavicle metastasis radiation completed 12-24-16, review 07-11-17 MRI brain, FU.  Pain:Denies pain. Skin:Warm and dry and intact. XOG:ACGBKO  Fatigue:Denies fatigue. Weight: Wt Readings from Last 3 Encounters:  07/13/17 207 lb 9.6 oz (94.2 kg)  06/28/17 210 lb 1.6 oz (95.3 kg)  05/26/17 208 lb 9.6 oz (94.6 kg)   Imaging: 07-11-17 MRI brain Lab:06-27-17 CMET, CBC W DIFF Has been fighting pneumonia for the past six weeks has taken three different antibiotics.  Still has a coughing clear secretion and SOB denies wheezing. 06-28-17 Saw Dr. Julien Nordmann the patient is currently on treatment with Tagrisso 80 mg by mouth daily status post 17 months. The patient continues to tolerate his treatment with Tagrisso fairly well.  For the metastatic bone disease, he will continue on Xgeva. I will see the patient back for follow-up visit in one month's for reevaluation and management of any adverse effect of his treatment BP 122/87   Pulse 73   Temp 97.8 F (36.6 C) (Oral)   Resp 18   Ht 6' 2"  (1.88 m)   Wt 207 lb 9.6 oz (94.2 kg)   SpO2 100%   BMI 26.65 kg/m

## 2017-07-11 ENCOUNTER — Ambulatory Visit
Admission: RE | Admit: 2017-07-11 | Discharge: 2017-07-11 | Disposition: A | Discharge status: Home / Self Care | Payer: BLUE CROSS/BLUE SHIELD | Source: Ambulatory Visit | Attending: Radiation Oncology | Admitting: Radiation Oncology

## 2017-07-11 ENCOUNTER — Encounter: Payer: Self-pay | Admitting: Radiation Oncology

## 2017-07-11 DIAGNOSIS — C7931 Secondary malignant neoplasm of brain: Secondary | ICD-10-CM

## 2017-07-11 DIAGNOSIS — C7949 Secondary malignant neoplasm of other parts of nervous system: Principal | ICD-10-CM

## 2017-07-11 MED ORDER — GADOBENATE DIMEGLUMINE 529 MG/ML IV SOLN
19.0000 mL | Freq: Once | INTRAVENOUS | Status: AC | PRN
  Administered 2017-07-11: 19 mL via INTRAVENOUS

## 2017-07-13 ENCOUNTER — Encounter: Payer: Self-pay | Admitting: Urology

## 2017-07-13 ENCOUNTER — Ambulatory Visit
Admission: RE | Admit: 2017-07-13 | Discharge: 2017-07-13 | Disposition: A | Discharge status: Home / Self Care | Payer: BLUE CROSS/BLUE SHIELD | Source: Ambulatory Visit | Attending: Urology | Admitting: Urology

## 2017-07-13 VITALS — BP 122/87 | HR 73 | Temp 97.8°F | Resp 18 | Ht 74.0 in | Wt 207.6 lb

## 2017-07-13 DIAGNOSIS — I10 Essential (primary) hypertension: Secondary | ICD-10-CM | POA: Diagnosis not present

## 2017-07-13 DIAGNOSIS — C7951 Secondary malignant neoplasm of bone: Secondary | ICD-10-CM | POA: Insufficient documentation

## 2017-07-13 DIAGNOSIS — Z809 Family history of malignant neoplasm, unspecified: Secondary | ICD-10-CM | POA: Insufficient documentation

## 2017-07-13 DIAGNOSIS — C7949 Secondary malignant neoplasm of other parts of nervous system: Secondary | ICD-10-CM | POA: Insufficient documentation

## 2017-07-13 DIAGNOSIS — C7931 Secondary malignant neoplasm of brain: Secondary | ICD-10-CM | POA: Diagnosis present

## 2017-07-13 DIAGNOSIS — C787 Secondary malignant neoplasm of liver and intrahepatic bile duct: Secondary | ICD-10-CM | POA: Insufficient documentation

## 2017-07-13 DIAGNOSIS — C3490 Malignant neoplasm of unspecified part of unspecified bronchus or lung: Secondary | ICD-10-CM | POA: Diagnosis not present

## 2017-07-13 NOTE — Addendum Note (Signed)
Encounter addended by: Malena Edman, RN on: 07/13/2017  3:01 PM<BR>    Actions taken: Charge Capture section accepted

## 2017-07-13 NOTE — Progress Notes (Signed)
Radiation Oncology         [phone]   Name: Oscar Floyd   Date: 07/13/2017   MRN: [id number]  DOB: 01-01-1966    Multidisciplinary Brain and Spine Oncology Clinic Follow-Up Visit Note  CC: [name], MD  [name], MD    ICD-10-CM   1. 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 Bone metastases (HCC) C79.51     Diagnosis:  51 y.o. gentleman with numerous subcentimeter brain metastases from EGFR positive adenocarcinoma the lung  Interval Since Last Radiation:  6 months s/p completion of palliative radiotherapy to the right clavicle; 3 years and 3 months s/p WBRT  12/13/2016 to 12/24/2016: The Right clavicle was treated to 30 Gy in 10 fractions at 3 Gy per fraction.  04/01/2014-04/12/2014: The whole brain was treated to 30 Gy in 10 fractions of 3 Gy  Narrative:  The patient returns today for routine follow-up. In summary this is a pleasant 51 y.o. 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 He received whole brain radiotherapy to the brain in 2015 and has been radiographically without disease since that time. He continues under the care of Dr. Julien Nordmann and is currently undergoing treatment with Tagrisso 80 mg by mouth daily. He had a recent follow up brain MRI  On 07/11/17 which demonstrates a stable exam with no residual active intracranial metastatic deposits evident. He comes today to review these results.  He has done very well s/p recent palliative radiotherapy for a painful bony met in the right clavicle. He reports complete resolution of his right clavicular pain since completing palliative XRT.  He continues to take Tagrisso as directed by Dr. Julien Nordmann for systemic therapy. He also receives [place] monthly for his bony metastatic disease. Recent CT C/A/P on 06/14/17 showed no evidence for disease progression.   On review of systems, the patient reports that he is  doing well overall.  He specifically denies frequent headaches, visual or auditory disturbances, and reports stable changes and short-term memory since completing whole brain radiation. He is currently being treated for pneumonia and on his 3rd round of antibiotics.  He continues with a nagging cough and mild increased SOB but feels like this is finally gradually improving.  He denies any chest pain, fevers, chills, night sweats or unintended weight changes. He denies any bowel or bladder disturbances, and denies abdominal pain, nausea or vomiting. He reports complete resolution of the right clavicular pain from bony metastasis s/p palliative radiotherapy.  He denies any new musculoskeletal or joint aches or pains, new skin lesions or concerns. A complete review of systems is obtained and is otherwise negative.    Past Medical History:  Past Medical History:  Diagnosis Date  . Bone metastases ([place]) 08/26/2015  . Encounter for antineoplastic chemotherapy 01/06/2016  . Family history of cancer   . Hypertension   . Lung cancer ([place])    RUL lung with mets to liver, bone and brain    Past Surgical History: Past Surgical History:  Procedure Laterality Date  . VASECTOMY    . VIDEO BRONCHOSCOPY Bilateral 03/20/2014   Procedure: VIDEO BRONCHOSCOPY WITH FLUORO;  Surgeon: Rigoberto Noel, MD;  Location: [facility];  Service: Cardiopulmonary;  Laterality: Bilateral;    Social History:  Social History   Social History  . Marital status: Married    Spouse name: N/A  . Number of children: 2  . Years  of education: N/A   Occupational History  . Not on file.   Social History Main Topics  . Smoking status: Never Smoker  . Smokeless tobacco: Never Used  . Alcohol use Yes     Comment: socially  . Drug use: No  . Sexual activity: Yes   Other Topics Concern  . Not on file   Social History Narrative  . No narrative on file  The patient is married and has 2 sons, he lives in [place], [place]. He  works for [employer].  Family History: Family History  Problem Relation Age of Onset  . Cancer Father 76       liposarcoma age 49  . Cancer Mother 53       gist  . Cancer Paternal Grandfather        lung cancer; heavy smoker; also had abdominal cancer in 82's  . Stroke Maternal Grandmother   . Heart disease Maternal Grandfather   . Cancer Other        multiple brothers of PGF with unknown cancer    ALLERGIES:  has No Known Allergies.  Meds: Current Outpatient Prescriptions  Medication Sig Dispense Refill  . benzonatate (TESSALON) 100 MG capsule Take 1 capsule (100 mg total) by mouth 3 (three) times daily as needed for cough. 30 capsule 0  . doxycycline (VIBRA-TABS) 100 MG tablet Take 1 tablet (100 mg total) by mouth 2 (two) times daily. 20 tablet 0  . esomeprazole (NEXIUM) 20 MG capsule Take 20 mg by mouth 2 (two) times daily before a meal.     . loperamide (IMODIUM) 2 MG capsule Take 2 mg by mouth as needed for diarrhea or loose stools.    . methylPREDNISolone (MEDROL DOSEPAK) 4 MG TBPK tablet Use as instructed 21 tablet 0  . naproxen sodium (ANAPROX) 220 MG tablet Take 220 mg by mouth as needed.    Marland Kitchen osimertinib mesylate (TAGRISSO) 80 MG tablet Take 1 tablet (80 mg total) by mouth daily. 30 tablet 2  . ALPRAZolam (XANAX) 1 MG tablet Reported on 02/11/2016    . calcium-vitamin D (OSCAL) 250-125 MG-UNIT per tablet Take 1 tablet by mouth daily. Reported on 01/12/2016    . diazepam (VALIUM) 10 MG tablet Take 1 tablet (10 mg total) by mouth as directed. 30 min before MRI (Patient not taking: Reported on 07/13/2017) 15 tablet 0  . mirtazapine (REMERON) 30 MG tablet Take 1 tablet (30 mg total) by mouth at bedtime. (Patient not taking: Reported on 07/13/2017) 30 tablet 2   No current facility-administered medications for this encounter.     Physical Findings:  height is 6' 2"  (1.88 m) and weight is 207 lb 9.6 oz (94.2 kg). His oral temperature is 97.8 F (36.6 C). His blood  pressure is 122/87 and his pulse is 73. His respiration is 18 and oxygen saturation is 100%.   In general this is a well appearing caucasian male in no acute distress. He's alert and oriented x4 and appropriate throughout the examination. Cardiopulmonary assessment is negative for acute distress and he exhibits normal effort. He appears to be grossly intact from a neurologic perspective. Minimal residual hyperpigmentation over the right clavicle but no pain with palpation or ROM of the upper extremities.  Lab Findings: Lab Results  Component Value Date   WBC 3.2 (L) 06/27/2017   HGB 13.3 06/27/2017   HCT 39.5 06/27/2017   MCV 89.6 06/27/2017   PLT 131 (L) 06/27/2017    Radiographic Findings: Ct Chest W  Contrast  Result Date: 06/15/2017 CLINICAL DATA:  51 year old male with history of metastatic non-small cell lung cancer (adenocarcinoma). Undergoing chemotherapy. Followup study. EXAM: CT CHEST, ABDOMEN, AND PELVIS WITH CONTRAST TECHNIQUE: Multidetector CT imaging of the chest, abdomen and pelvis was performed following the standard protocol during bolus administration of intravenous contrast. CONTRAST:  120m ISOVUE-300 IOPAMIDOL (ISOVUE-300) INJECTION 61% COMPARISON:  CT of the chest, abdomen and pelvis 02/21/2017. FINDINGS: CT CHEST FINDINGS Cardiovascular: Heart size is normal. There is no significant pericardial fluid, thickening or pericardial calcification. No significant atherosclerotic disease, aneurysm or dissection noted in the thoracic vasculature. No coronary artery calcifications. Mediastinum/Nodes: No pathologically enlarged mediastinal or hilar lymph nodes. Esophagus is unremarkable in appearance. No axillary lymphadenopathy. Lungs/Pleura: The primary nodule in the inferior aspect of the right upper lobe is similar in size to the prior examination measuring 13 x 18 mm on today's study (axial image 72 of series 6). Several other small pulmonary nodules are noted elsewhere throughout the  lungs, many of these are stable compared to the prior examination. A few nodules do appear slightly larger, most notable for a a 7 mm nodule in the superior segment of the right lower lobe (axial image 79 of series 6) which previously measured only 4 mm on prior study 02/21/2017. In addition, there is extensive airspace consolidation throughout the periphery of the left lower lobe which is new compared to the prior study, concerning for acute infection. No pleural effusions. Musculoskeletal: Widespread predominantly sclerotic lesions are again noted throughout all aspects of the visualized axial and appendicular skeleton, indicative of widespread metastatic disease to the bones. The overall burden of metastatic disease to the bones appears very similar to prior examinations, although some lesions do appear slightly more sclerotic than the prior study (most evident in the medial aspect of the right clavicle), suggesting some healing of pre-existing lesions. CT ABDOMEN PELVIS FINDINGS Hepatobiliary: No cystic or solid hepatic lesions. No intra or extrahepatic biliary ductal dilatation. Gallbladder is normal in appearance. Pancreas: No pancreatic mass. No pancreatic ductal dilatation. No pancreatic or peripancreatic fluid or inflammatory changes. Spleen: Unremarkable. Adrenals/Urinary Tract: Subcentimeter low-attenuation lesion in the interpolar region of the right kidney is too small to definitively characterize, but is statistically likely a tiny cyst. Left kidney and bilateral adrenal glands are normal in appearance. No hydroureteronephrosis. Urinary bladder is normal in appearance. Stomach/Bowel: Normal appearance of the stomach. No pathologic dilatation of small bowel or colon. Normal appendix. Vascular/Lymphatic: No significant atherosclerotic disease, aneurysm or dissection noted in the abdominal or pelvic vasculature. No lymphadenopathy noted in the abdomen or pelvis. Some amorphous soft tissue is seen adjacent  to the proximal abdominal aorta in the region of the origin of the celiac axis, extending adjacent to the diaphragmatic crus bilaterally, nonspecific and similar to the prior study. Reproductive: Prostate gland and seminal vesicles are unremarkable in appearance. Other: No significant volume of ascites.  No pneumoperitoneum. Musculoskeletal: Multiple sclerotic lesions are again noted throughout the visualized axial and appendicular skeleton, very similar to prior examinations, compatible with widespread metastatic disease to the bones. IMPRESSION: 1. New area of airspace consolidation in the left lower lobe concerning for left lower lobe pneumonia. 2. The primary lesion in the inferior aspect of the right upper lobe is similar in size to the prior examination. Most previously noted small pulmonary nodules are stable compared to the prior study, with a few exceptions which have shown minimal growth. Widespread metastatic disease to the bones is also similar compared to prior examinations.  No new sites of metastatic disease are noted on today's study in the abdomen or pelvis. 3. Additional incidental findings, as above. Electronically Signed   By: Vinnie Langton M.D.   On: 06/15/2017 09:00   Mr [name] VP Contrast  Result Date: 07/11/2017 CLINICAL DATA:  Continued surveillance metastatic disease. Non-small-cell lung cancer, whole-brain radiation 04/12/2014. EXAM: MRI HEAD WITHOUT AND WITH CONTRAST TECHNIQUE: Multiplanar, multiecho pulse sequences of the brain and surrounding structures were obtained without and with intravenous contrast. CONTRAST:  69m MULTIHANCE GADOBENATE DIMEGLUMINE 529 MG/ML IV SOLN COMPARISON:  03/04/2017 most recent. FINDINGS: Brain: No evidence for acute infarction, hemorrhage, mass lesion, hydrocephalus, or extra-axial fluid. Mild atrophy. No significant white matter disease. No residual active metastatic deposits are evident. No abnormal postcontrast enhancement is seen. No areas of  vasogenic edema are observed. Vascular: Normal flow voids. Tiny focus of hemosiderin LEFT posterior frontal subcortical white matter. Skull and upper cervical spine: No progression of previously treated metastases. Sinuses/Orbits: No layering fluid.  Negative orbits. Other: None. IMPRESSION: Stable exam. No residual active intracranial metastatic deposits are evident. Stable treated osseous metastases. Electronically Signed   By: JStaci RighterM.D.   On: 07/11/2017 16:03   Ct Abdomen Pelvis W Contrast  Result Date: 06/15/2017 CLINICAL DATA:  51year old male with history of metastatic non-small cell lung cancer (adenocarcinoma). Undergoing chemotherapy. Followup study. EXAM: CT CHEST, ABDOMEN, AND PELVIS WITH CONTRAST TECHNIQUE: Multidetector CT imaging of the chest, abdomen and pelvis was performed following the standard protocol during bolus administration of intravenous contrast. CONTRAST:  1068mISOVUE-300 IOPAMIDOL (ISOVUE-300) INJECTION 61% COMPARISON:  CT of the chest, abdomen and pelvis 02/21/2017. FINDINGS: CT CHEST FINDINGS Cardiovascular: Heart size is normal. There is no significant pericardial fluid, thickening or pericardial calcification. No significant atherosclerotic disease, aneurysm or dissection noted in the thoracic vasculature. No coronary artery calcifications. Mediastinum/Nodes: No pathologically enlarged mediastinal or hilar lymph nodes. Esophagus is unremarkable in appearance. No axillary lymphadenopathy. Lungs/Pleura: The primary nodule in the inferior aspect of the right upper lobe is similar in size to the prior examination measuring 13 x 18 mm on today's study (axial image 72 of series 6). Several other small pulmonary nodules are noted elsewhere throughout the lungs, many of these are stable compared to the prior examination. A few nodules do appear slightly larger, most notable for a a 7 mm nodule in the superior segment of the right lower lobe (axial image 79 of series 6) which  previously measured only 4 mm on prior study 02/21/2017. In addition, there is extensive airspace consolidation throughout the periphery of the left lower lobe which is new compared to the prior study, concerning for acute infection. No pleural effusions. Musculoskeletal: Widespread predominantly sclerotic lesions are again noted throughout all aspects of the visualized axial and appendicular skeleton, indicative of widespread metastatic disease to the bones. The overall burden of metastatic disease to the bones appears very similar to prior examinations, although some lesions do appear slightly more sclerotic than the prior study (most evident in the medial aspect of the right clavicle), suggesting some healing of pre-existing lesions. CT ABDOMEN PELVIS FINDINGS Hepatobiliary: No cystic or solid hepatic lesions. No intra or extrahepatic biliary ductal dilatation. Gallbladder is normal in appearance. Pancreas: No pancreatic mass. No pancreatic ductal dilatation. No pancreatic or peripancreatic fluid or inflammatory changes. Spleen: Unremarkable. Adrenals/Urinary Tract: Subcentimeter low-attenuation lesion in the interpolar region of the right kidney is too small to definitively characterize, but is statistically likely a tiny cyst. Left kidney and  bilateral adrenal glands are normal in appearance. No hydroureteronephrosis. Urinary bladder is normal in appearance. Stomach/Bowel: Normal appearance of the stomach. No pathologic dilatation of small bowel or colon. Normal appendix. Vascular/Lymphatic: No significant atherosclerotic disease, aneurysm or dissection noted in the abdominal or pelvic vasculature. No lymphadenopathy noted in the abdomen or pelvis. Some amorphous soft tissue is seen adjacent to the proximal abdominal aorta in the region of the origin of the celiac axis, extending adjacent to the diaphragmatic crus bilaterally, nonspecific and similar to the prior study. Reproductive: Prostate gland and seminal  vesicles are unremarkable in appearance. Other: No significant volume of ascites.  No pneumoperitoneum. Musculoskeletal: Multiple sclerotic lesions are again noted throughout the visualized axial and appendicular skeleton, very similar to prior examinations, compatible with widespread metastatic disease to the bones. IMPRESSION: 1. New area of airspace consolidation in the left lower lobe concerning for left lower lobe pneumonia. 2. The primary lesion in the inferior aspect of the right upper lobe is similar in size to the prior examination. Most previously noted small pulmonary nodules are stable compared to the prior study, with a few exceptions which have shown minimal growth. Widespread metastatic disease to the bones is also similar compared to prior examinations. No new sites of metastatic disease are noted on today's study in the abdomen or pelvis. 3. Additional incidental findings, as above. Electronically Signed   By: Vinnie Langton M.D.   On: 06/15/2017 09:00   Impression/Plan: 1.  Stage IV NSCLC, adenocarcinoma of the right upper lobe with disease to the liver, brain and bone. He has had resolution of the right clavicular pain since completing palliative radiotherapy.  The patient has no evidence of progressive or recurrent brain metastases at this time on recent MRI which was reviewed at the multidisciplinary brain conference this morning. He was given a copy of the report for his records.   We will continue serial imaging with MRI in 4 months and a follow up afterwards.  He will continue close follow up with Dr. Julien Nordmann for systemic disease management and surveillance.  Recent CT C/A/P showed no evidence for disease progression.   The current plan is to continue with his current systemic treatment with Tagrisso and Delton See.     Nicholos Johns, PA-C

## 2017-07-14 ENCOUNTER — Encounter: Payer: Self-pay | Admitting: *Deleted

## 2017-07-14 ENCOUNTER — Other Ambulatory Visit: Payer: Self-pay | Admitting: Internal Medicine

## 2017-07-14 ENCOUNTER — Encounter: Payer: Self-pay | Admitting: Pulmonary Disease

## 2017-07-14 ENCOUNTER — Ambulatory Visit (INDEPENDENT_AMBULATORY_CARE_PROVIDER_SITE_OTHER)
Admission: RE | Admit: 2017-07-14 | Discharge: 2017-07-14 | Disposition: A | Discharge status: Home / Self Care | Payer: BLUE CROSS/BLUE SHIELD | Source: Ambulatory Visit | Attending: Pulmonary Disease | Admitting: Pulmonary Disease

## 2017-07-14 ENCOUNTER — Ambulatory Visit (INDEPENDENT_AMBULATORY_CARE_PROVIDER_SITE_OTHER): Payer: BLUE CROSS/BLUE SHIELD | Admitting: Pulmonary Disease

## 2017-07-14 VITALS — BP 118/76 | HR 80 | Ht 74.0 in | Wt 205.0 lb

## 2017-07-14 DIAGNOSIS — R059 Cough, unspecified: Secondary | ICD-10-CM

## 2017-07-14 DIAGNOSIS — R05 Cough: Secondary | ICD-10-CM | POA: Diagnosis not present

## 2017-07-14 DIAGNOSIS — J181 Lobar pneumonia, unspecified organism: Secondary | ICD-10-CM

## 2017-07-14 DIAGNOSIS — C3491 Malignant neoplasm of unspecified part of right bronchus or lung: Secondary | ICD-10-CM

## 2017-07-14 DIAGNOSIS — J189 Pneumonia, unspecified organism: Secondary | ICD-10-CM | POA: Insufficient documentation

## 2017-07-14 MED ORDER — METHYLPREDNISOLONE 4 MG PO TBPK: ORAL_TABLET | ORAL | 0 refills | Status: DC

## 2017-07-14 NOTE — H&P (View-Only) (Signed)
   Subjective:    Patient ID: Oscar Floyd, male    DOB: 12-30-1965, 51 y.o.   MRN: [id number]  HPI  51 year old never smoker diagnosed with metastatic poorly differentiated adenocarcinoma with positive EGFR in July 2015 when he presented with a right upper lobe mass and metastasis to liver and bone. He had good initial response to [facility] but then developed EGFR resistance mutation in April 2017 now on [place]  He presented 9/13 with nonproductive cough, sinus congestion and was initially given Z-Pak, symptoms persisted and he was then given Levaquin and a prednisone Dosepak.  He was then given doxycycline which she has just completed and another prednisone Dosepak has been prescribed. Cough is persistent, he reports mild shortness of breath on walking long distances for instance when he plays golf.   CT chest 06/14/17 showed new area of airspace consolidation in the left lower lobe.  Pulmonary nodules including the primary lesion in the inferior aspect of the right upper lobe and bone metastases were stable compared to prior imaging studies  Chest x-ray from today shows left lower lobe consolidation Labs from 10/15 were reviewed which shows mild leukopenia and thrombocytopenia with normal hemoglobin  Review of Systems    Past Medical History:  Diagnosis Date  . Bone metastases ([place]) 08/26/2015  . Encounter for antineoplastic chemotherapy 01/06/2016  . Family history of cancer   . Hypertension   . Lung cancer ([place])    RUL lung with mets to liver, bone and brain    neg for any significant sore throat, dysphagia, itching, sneezing, nasal congestion or excess/ purulent secretions, fever, chills, sweats, unintended wt loss, pleuritic or exertional cp, hempoptysis, orthopnea pnd or change in chronic leg swelling. Also denies presyncope, palpitations, heartburn, abdominal pain, nausea, vomiting, diarrhea or change in bowel or urinary habits, dysuria,hematuria, rash, arthralgias,  visual complaints, headache, numbness weakness or ataxia.     Objective:   Physical Exam   Gen. Pleasant, well-nourished, in no distress ENT - no thrush, no post nasal drip Neck: No JVD, no thyromegaly, no carotid bruits Lungs: no use of accessory muscles, no dullness to percussion, clear without rales or rhonchi  Cardiovascular: Rhythm regular, heart sounds  normal, no murmurs or gallops, no peripheral edema Musculoskeletal: No deformities, no cyanosis or clubbing         Assessment & Plan:

## 2017-07-14 NOTE — Assessment & Plan Note (Addendum)
Differential diagnosis of left lower lobe consolidation includes infectious pneumonia, drug induced pneumonitis from tagrisso or malignancy.  Curious that he has not responded to antibiotics. We will discuss with oncology whether we need to proceed with a bronchoscopy to better clarify. He has been given another dose pack that he will continue

## 2017-07-14 NOTE — Patient Instructions (Signed)
Chest x-ray today.  Complete course of prednisone Dosepak   Increase Nexium twice daily  I will discuss with Dr. Julien Nordmann and decide on best course of action if cough persists  Trial of albuterol MDI 2 puffs as needed for shortness of breath or wheezing Use Delsym 5 mL twice daily as needed for cough

## 2017-07-14 NOTE — Assessment & Plan Note (Signed)
We will treat usual suspects including GERD Increase Nexium twice daily  I will discuss with Dr. Julien Nordmann and decide on best course of action if cough persists  Trial of albuterol MDI 2 puffs as needed for shortness of breath or wheezing Use Delsym 5 mL twice daily as needed for cough

## 2017-07-14 NOTE — Progress Notes (Signed)
   Subjective:    Patient ID: Oscar Floyd, male    DOB: 02/05/1966, 51 y.o.   MRN: [id number]  HPI  51 year old never smoker diagnosed with metastatic poorly differentiated adenocarcinoma with positive EGFR in July 2015 when he presented with a right upper lobe mass and metastasis to liver and bone. He had good initial response to [facility] but then developed EGFR resistance mutation in April 2017 now on [place]  He presented 9/13 with nonproductive cough, sinus congestion and was initially given Z-Pak, symptoms persisted and he was then given Levaquin and a prednisone Dosepak.  He was then given doxycycline which she has just completed and another prednisone Dosepak has been prescribed. Cough is persistent, he reports mild shortness of breath on walking long distances for instance when he plays golf.   CT chest 06/14/17 showed new area of airspace consolidation in the left lower lobe.  Pulmonary nodules including the primary lesion in the inferior aspect of the right upper lobe and bone metastases were stable compared to prior imaging studies  Chest x-ray from today shows left lower lobe consolidation Labs from 10/15 were reviewed which shows mild leukopenia and thrombocytopenia with normal hemoglobin  Review of Systems    Past Medical History:  Diagnosis Date  . Bone metastases ([place]) 08/26/2015  . Encounter for antineoplastic chemotherapy 01/06/2016  . Family history of cancer   . Hypertension   . Lung cancer ([place])    RUL lung with mets to liver, bone and brain    neg for any significant sore throat, dysphagia, itching, sneezing, nasal congestion or excess/ purulent secretions, fever, chills, sweats, unintended wt loss, pleuritic or exertional cp, hempoptysis, orthopnea pnd or change in chronic leg swelling. Also denies presyncope, palpitations, heartburn, abdominal pain, nausea, vomiting, diarrhea or change in bowel or urinary habits, dysuria,hematuria, rash, arthralgias,  visual complaints, headache, numbness weakness or ataxia.     Objective:   Physical Exam   Gen. Pleasant, well-nourished, in no distress ENT - no thrush, no post nasal drip Neck: No JVD, no thyromegaly, no carotid bruits Lungs: no use of accessory muscles, no dullness to percussion, clear without rales or rhonchi  Cardiovascular: Rhythm regular, heart sounds  normal, no murmurs or gallops, no peripheral edema Musculoskeletal: No deformities, no cyanosis or clubbing         Assessment & Plan:

## 2017-07-14 NOTE — Progress Notes (Signed)
Oncology Nurse Navigator Documentation  Oncology Nurse Navigator Flowsheets 07/14/2017  Navigator Location [facility]  Navigator Encounter Type Telephone/I received a call from Mr. Oscar Floyd today. He states he still has a cough and he is concerned.  I noticed patient had an appt with Rad Onc yesterday and I called and spoke with Ashlyn PA.  She states he did complain of cough and has persisted through 3 different antibiotics. I updated Dr. Julien Nordmann. He ordered him medrol dose pack and asked for me to make referral to pulmonary.  I completed referral and contacted patient with an update. He was thankful for the help. I will also updated Dr. Elsworth Soho, pulmonologist who saw Mr. Oscar Floyd in the past.   Telephone Incoming Call;Outgoing Call  Treatment Phase Treatment  Barriers/Navigation Needs Coordination of Care;Education  Education Other  Interventions Coordination of Care;Education  Coordination of Care Other  Education Method Verbal  Acuity Level 2  Acuity Level 2 Educational needs;Assistance expediting appointments  Time Spent with Patient 30

## 2017-07-15 ENCOUNTER — Telehealth: Payer: Self-pay | Admitting: Pulmonary Disease

## 2017-07-15 MED ORDER — ALBUTEROL SULFATE HFA 108 (90 BASE) MCG/ACT IN AERS: 2.0000 | INHALATION_SPRAY | Freq: Four times a day (QID) | RESPIRATORY_TRACT | 6 refills | Status: DC | PRN

## 2017-07-15 NOTE — Telephone Encounter (Signed)
I have sent in the albuterol HFA and I have called the pt and lmom to make him aware.

## 2017-07-19 ENCOUNTER — Telehealth: Payer: Self-pay | Admitting: *Deleted

## 2017-07-19 ENCOUNTER — Telehealth: Payer: Self-pay | Admitting: Pulmonary Disease

## 2017-07-19 ENCOUNTER — Telehealth (HOSPITAL_COMMUNITY): Payer: Self-pay

## 2017-07-19 NOTE — Telephone Encounter (Signed)
Patient has already been scheduled for procedure. Will close this encounter.

## 2017-07-19 NOTE — Telephone Encounter (Signed)
D/w Dr Earlie Server - DD incl drug induced pneumonitis D/w pt >> proceed with bronchoscopy - will try to arrange 11/7 am - NPO after MN

## 2017-07-19 NOTE — Telephone Encounter (Signed)
Oncology Nurse Navigator Documentation  Oncology Nurse Navigator Flowsheets 07/19/2017  Navigator Location [facility]  Navigator Encounter Type Telephone/per Dr. Julien Nordmann, he asked that I call Oscar Floyd and ask him to stop oral biologic for 5 days.  At that time to call and report to me how he is feeling. I called and spoke with patient. He verbalized understanding. I will updated team.   Telephone Incoming Call  Treatment Phase Treatment  Barriers/Navigation Needs Education  Education Other  Interventions Education  Education Method Verbal  Acuity Level 2  Time Spent with Patient 30

## 2017-07-20 ENCOUNTER — Encounter (HOSPITAL_COMMUNITY): Payer: BLUE CROSS/BLUE SHIELD

## 2017-07-20 ENCOUNTER — Ambulatory Visit (HOSPITAL_COMMUNITY)
Admission: RE | Admit: 2017-07-20 | Discharge: 2017-07-20 | Disposition: A | Discharge status: Home / Self Care | Payer: BLUE CROSS/BLUE SHIELD | Source: Ambulatory Visit | Attending: Pulmonary Disease | Admitting: Pulmonary Disease

## 2017-07-20 ENCOUNTER — Encounter (HOSPITAL_COMMUNITY): Payer: Self-pay | Admitting: Respiratory Therapy

## 2017-07-20 ENCOUNTER — Encounter (HOSPITAL_COMMUNITY)
Admission: RE | Disposition: A | Discharge status: Home / Self Care | Payer: Self-pay | Source: Ambulatory Visit | Attending: Pulmonary Disease

## 2017-07-20 ENCOUNTER — Ambulatory Visit (HOSPITAL_COMMUNITY): Payer: BLUE CROSS/BLUE SHIELD

## 2017-07-20 DIAGNOSIS — C7931 Secondary malignant neoplasm of brain: Secondary | ICD-10-CM | POA: Insufficient documentation

## 2017-07-20 DIAGNOSIS — I10 Essential (primary) hypertension: Secondary | ICD-10-CM | POA: Diagnosis not present

## 2017-07-20 DIAGNOSIS — J984 Other disorders of lung: Secondary | ICD-10-CM

## 2017-07-20 DIAGNOSIS — C3411 Malignant neoplasm of upper lobe, right bronchus or lung: Secondary | ICD-10-CM | POA: Insufficient documentation

## 2017-07-20 DIAGNOSIS — C787 Secondary malignant neoplasm of liver and intrahepatic bile duct: Secondary | ICD-10-CM | POA: Diagnosis not present

## 2017-07-20 DIAGNOSIS — Z9221 Personal history of antineoplastic chemotherapy: Secondary | ICD-10-CM | POA: Insufficient documentation

## 2017-07-20 DIAGNOSIS — C7951 Secondary malignant neoplasm of bone: Secondary | ICD-10-CM | POA: Diagnosis not present

## 2017-07-20 DIAGNOSIS — J189 Pneumonia, unspecified organism: Secondary | ICD-10-CM | POA: Diagnosis not present

## 2017-07-20 DIAGNOSIS — D72819 Decreased white blood cell count, unspecified: Secondary | ICD-10-CM | POA: Diagnosis not present

## 2017-07-20 DIAGNOSIS — Z9889 Other specified postprocedural states: Secondary | ICD-10-CM

## 2017-07-20 DIAGNOSIS — Z809 Family history of malignant neoplasm, unspecified: Secondary | ICD-10-CM | POA: Insufficient documentation

## 2017-07-20 DIAGNOSIS — T50905A Adverse effect of unspecified drugs, medicaments and biological substances, initial encounter: Secondary | ICD-10-CM | POA: Diagnosis not present

## 2017-07-20 DIAGNOSIS — D696 Thrombocytopenia, unspecified: Secondary | ICD-10-CM | POA: Diagnosis not present

## 2017-07-20 DIAGNOSIS — C3490 Malignant neoplasm of unspecified part of unspecified bronchus or lung: Secondary | ICD-10-CM | POA: Diagnosis present

## 2017-07-20 HISTORY — PX: VIDEO BRONCHOSCOPY: SHX5072

## 2017-07-20 SURGERY — BRONCHOSCOPY, WITH FLUOROSCOPY
Anesthesia: Moderate Sedation | Laterality: Bilateral

## 2017-07-20 MED ORDER — PHENYLEPHRINE HCL 0.25 % NA SOLN
NASAL | Status: DC | PRN
  Administered 2017-07-20: 2 via NASAL

## 2017-07-20 MED ORDER — FENTANYL CITRATE (PF) 100 MCG/2ML IJ SOLN
100.0000 ug | Freq: Once | INTRAMUSCULAR | Status: AC
  Administered 2017-07-20: 100 ug via INTRAVENOUS

## 2017-07-20 MED ORDER — BUTAMBEN-TETRACAINE-BENZOCAINE 2-2-14 % EX AERO: 1.0000 | INHALATION_SPRAY | Freq: Once | CUTANEOUS | Status: DC

## 2017-07-20 MED ORDER — LIDOCAINE HCL 2 % EX GEL
CUTANEOUS | Status: DC | PRN
  Administered 2017-07-20: 1

## 2017-07-20 MED ORDER — SODIUM CHLORIDE 0.9 % IV SOLN
INTRAVENOUS | Status: DC
  Administered 2017-07-20: 08:00:00 via INTRAVENOUS

## 2017-07-20 MED ORDER — FENTANYL CITRATE (PF) 100 MCG/2ML IJ SOLN
INTRAMUSCULAR | Status: AC
  Filled 2017-07-20: qty 4

## 2017-07-20 MED ORDER — MIDAZOLAM HCL 10 MG/2ML IJ SOLN
INTRAMUSCULAR | Status: DC | PRN
  Administered 2017-07-20 (×4): 1 mg via INTRAVENOUS

## 2017-07-20 MED ORDER — LIDOCAINE HCL 2 % EX GEL: 1.0000 "application " | Freq: Once | CUTANEOUS | Status: DC

## 2017-07-20 MED ORDER — MIDAZOLAM HCL 5 MG/ML IJ SOLN
INTRAMUSCULAR | Status: AC
  Filled 2017-07-20: qty 2

## 2017-07-20 MED ORDER — LIDOCAINE HCL 1 % IJ SOLN
INTRAMUSCULAR | Status: DC | PRN
  Administered 2017-07-20: 6 mL via RESPIRATORY_TRACT

## 2017-07-20 MED ORDER — PHENYLEPHRINE HCL 0.25 % NA SOLN: 1.0000 | Freq: Four times a day (QID) | NASAL | Status: DC | PRN

## 2017-07-20 NOTE — Interval H&P Note (Signed)
History and Physical Interval Note:  07/20/2017 8:52 AM  Oscar Floyd  has presented today for surgery, with the diagnosis of cough  The various methods of treatment have been discussed with the patient and family. After consideration of risks, benefits and other options for treatment, the patient has consented to  Procedure(s): VIDEO BRONCHOSCOPY WITH FLUORO (Bilateral) as a surgical intervention .  The patient's history has been reviewed, patient examined, no change in status, stable for surgery.  I have reviewed the patient's chart and labs.  Questions were answered to the patient's satisfaction.     Jeanie Mccard V.

## 2017-07-20 NOTE — Discharge Instructions (Signed)
Flexible Bronchoscopy, Care After These instructions give you information on caring for yourself after your procedure. Your doctor may also give you more specific instructions. Call your doctor if you have any problems or questions after your procedure. Follow these instructions at home:  Do not eat or drink anything for 2 hours after your procedure. If you try to eat or drink before the medicine wears off, food or drink could go into your lungs. You could also burn yourself.  After 2 hours have passed and when you can cough and gag normally, you may eat soft food and drink liquids slowly.  The day after the test, you may eat your normal diet.  You may do your normal activities.  Keep all doctor visits. Get help right away if:  You get more and more short of breath.  You get light-headed.  You feel like you are going to pass out (faint).  You have chest pain.  You have new problems that worry you.  You cough up more than a little blood.  You cough up more blood than before. This information is not intended to replace advice given to you by your health care provider. Make sure you discuss any questions you have with your health care provider. Document Released: 06/27/2009 Document Revised: 02/05/2016 Document Reviewed: 05/04/2013 Elsevier Interactive Patient Education  2017 [place].  Nothing to eat or drink until    10:45    am today 07/21/2017

## 2017-07-20 NOTE — Progress Notes (Signed)
Video Bronchoscopy done  Intervention Bronchial washings Intervention Bronchial biopsy  Procedure tolerated well

## 2017-07-20 NOTE — Op Note (Signed)
Indication : Left lower lobe infiltrate in this 51 year old with metastatic adenocarcinoma of the lung Differential diagnosis being drug toxicity  Written informed consent was obtained prior to the procedure. The risks of the procedure including coughing, bleeding and the small chance of lung puncture requiring chest tube were discussed in great detail. The benefits & alternatives including serial follow up were also discussed.   4 mg versed & 100  mcg fentnayl used in divided doses during the procedure.  Conscious sedation was supervised by me.  Total time x 12 minutes  Bronchoscope entered from the right nare. Upper airway nml Vocal cords showed nml appearance & motion. Trachea & bronchial tree examined to the subsegmental level. Minimal amount of white secretions were noted. No endobronchial lesions seen. Trans bronchial biopsies x 3 were obtained from the LLL under fluoroscopy. BAL was also obtained from the LLL.  There was moderate coughing  during the procedure.  Bleeding was minimal  A CXR will be performed to r/o presence of pneumothorax.  ALVA,RAKESH V.  [phone]

## 2017-07-21 LAB — ACID FAST SMEAR (AFB, MYCOBACTERIA)

## 2017-07-21 LAB — ACID FAST SMEAR (AFB): ACID FAST SMEAR - AFSCU2: NEGATIVE

## 2017-07-22 ENCOUNTER — Encounter (HOSPITAL_COMMUNITY): Payer: Self-pay | Admitting: Pulmonary Disease

## 2017-07-22 ENCOUNTER — Telehealth: Payer: Self-pay | Admitting: *Deleted

## 2017-07-22 ENCOUNTER — Encounter: Payer: Self-pay | Admitting: *Deleted

## 2017-07-22 DIAGNOSIS — C3491 Malignant neoplasm of unspecified part of right bronchus or lung: Secondary | ICD-10-CM

## 2017-07-22 NOTE — Telephone Encounter (Signed)
Per Dr. Julien Nordmann, PET scan ordered. I called to updated patient but was unable to reach. I left vm message to call with my name and phone number.    I also requested foundation one to be sent on recent tissue.

## 2017-07-22 NOTE — Telephone Encounter (Signed)
Oncology Nurse Navigator Documentation  Oncology Nurse Navigator Flowsheets 07/22/2017  Navigator Location [facility]  Navigator Encounter Type Telephone/Oscar Floyd called and left me a vm message. I called and spoke with him. I updated him on next steps and clarified questions.   Telephone Outgoing Call;Incoming Call  Treatment Phase Treatment  Barriers/Navigation Needs Education  Education Other  Interventions Education  Education Method Verbal  Acuity Level 1  Time Spent with Patient 30

## 2017-07-22 NOTE — Progress Notes (Signed)
Oncology Nurse Navigator Documentation  Oncology Nurse Navigator Flowsheets 07/22/2017  Navigator Location [facility]  Navigator Encounter Type Letter/Fax/Email/I received an email from patient's wife. She had questions about recent updates. I clarified.   Treatment Phase Treatment  Barriers/Navigation Needs Education  Education Other  Interventions Education  Education Method Written  Acuity Level 1  Time Spent with Patient 15

## 2017-07-23 LAB — CULTURE, BAL-QUANTITATIVE W GRAM STAIN

## 2017-07-23 LAB — CULTURE, BAL-QUANTITATIVE

## 2017-07-25 ENCOUNTER — Telehealth: Payer: Self-pay | Admitting: *Deleted

## 2017-07-25 NOTE — Telephone Encounter (Signed)
Oncology Nurse Navigator Documentation  Oncology Nurse Navigator Flowsheets 07/25/2017  Navigator Location [facility]  Navigator Encounter Type Telephone/I called Oscar Floyd to see how he was feeling. I left vm message for him to call me.   Telephone Outgoing Call  Treatment Phase Treatment  Acuity Level 1  Time Spent with Patient 15

## 2017-07-25 NOTE — Telephone Encounter (Signed)
Oncology Nurse Navigator Documentation  Oncology Nurse Navigator Flowsheets 07/25/2017  Navigator Location [facility]  Navigator Encounter Type Telephone/I received a call from Dr. Hall Busing.  I asked how he was feeling today.  I updated me that he is SOB and coughing.  I listened as he explained. Per Dr. Julien Nordmann, I told him to re-start his oral oncolytic.  He verbalized understanding. He is coming to see Dr. Julien Nordmann tomorrow for a follow up.  He asked about his PET scan.  I updated him it is not authorized yet.  I contacted Darlena to help get approved.   Telephone Incoming Call  Treatment Phase Treatment  Barriers/Navigation Needs Education  Education Other  Interventions Education  Education Method Verbal  Acuity Level 1  Time Spent with Patient 15

## 2017-07-26 ENCOUNTER — Ambulatory Visit (HOSPITAL_BASED_OUTPATIENT_CLINIC_OR_DEPARTMENT_OTHER): Payer: BLUE CROSS/BLUE SHIELD | Admitting: Internal Medicine

## 2017-07-26 ENCOUNTER — Ambulatory Visit (HOSPITAL_BASED_OUTPATIENT_CLINIC_OR_DEPARTMENT_OTHER): Payer: BLUE CROSS/BLUE SHIELD

## 2017-07-26 ENCOUNTER — Telehealth: Payer: Self-pay | Admitting: *Deleted

## 2017-07-26 ENCOUNTER — Other Ambulatory Visit: Payer: Self-pay | Admitting: Medical Oncology

## 2017-07-26 ENCOUNTER — Encounter: Payer: Self-pay | Admitting: Medical Oncology

## 2017-07-26 ENCOUNTER — Encounter: Payer: Self-pay | Admitting: Internal Medicine

## 2017-07-26 ENCOUNTER — Telehealth: Payer: Self-pay | Admitting: Internal Medicine

## 2017-07-26 ENCOUNTER — Other Ambulatory Visit (HOSPITAL_BASED_OUTPATIENT_CLINIC_OR_DEPARTMENT_OTHER): Payer: BLUE CROSS/BLUE SHIELD

## 2017-07-26 VITALS — BP 113/73 | HR 96 | Temp 98.7°F | Resp 18 | Ht 74.0 in | Wt 208.4 lb

## 2017-07-26 DIAGNOSIS — C3400 Malignant neoplasm of unspecified main bronchus: Secondary | ICD-10-CM

## 2017-07-26 DIAGNOSIS — C787 Secondary malignant neoplasm of liver and intrahepatic bile duct: Secondary | ICD-10-CM | POA: Diagnosis not present

## 2017-07-26 DIAGNOSIS — C3491 Malignant neoplasm of unspecified part of right bronchus or lung: Secondary | ICD-10-CM

## 2017-07-26 DIAGNOSIS — C7951 Secondary malignant neoplasm of bone: Secondary | ICD-10-CM | POA: Diagnosis not present

## 2017-07-26 DIAGNOSIS — C7949 Secondary malignant neoplasm of other parts of nervous system: Secondary | ICD-10-CM

## 2017-07-26 DIAGNOSIS — C7802 Secondary malignant neoplasm of left lung: Secondary | ICD-10-CM | POA: Diagnosis not present

## 2017-07-26 DIAGNOSIS — C3411 Malignant neoplasm of upper lobe, right bronchus or lung: Secondary | ICD-10-CM

## 2017-07-26 DIAGNOSIS — C7931 Secondary malignant neoplasm of brain: Secondary | ICD-10-CM | POA: Diagnosis not present

## 2017-07-26 DIAGNOSIS — R05 Cough: Secondary | ICD-10-CM

## 2017-07-26 DIAGNOSIS — Z5111 Encounter for antineoplastic chemotherapy: Secondary | ICD-10-CM

## 2017-07-26 LAB — CBC WITH DIFFERENTIAL/PLATELET
BASO%: 0.2 % (ref 0.0–2.0)
BASOS ABS: 0 10*3/uL (ref 0.0–0.1)
EOS ABS: 0.1 10*3/uL (ref 0.0–0.5)
EOS%: 2.4 % (ref 0.0–7.0)
HEMATOCRIT: 44.7 % (ref 38.4–49.9)
HEMOGLOBIN: 14.9 g/dL (ref 13.0–17.1)
LYMPH#: 1.2 10*3/uL (ref 0.9–3.3)
LYMPH%: 22.6 % (ref 14.0–49.0)
MCH: 30.3 pg (ref 27.2–33.4)
MCHC: 33.3 g/dL (ref 32.0–36.0)
MCV: 90.9 fL (ref 79.3–98.0)
MONO#: 0.4 10*3/uL (ref 0.1–0.9)
MONO%: 8.3 % (ref 0.0–14.0)
NEUT#: 3.4 10*3/uL (ref 1.5–6.5)
NEUT%: 66.5 % (ref 39.0–75.0)
PLATELETS: 164 10*3/uL (ref 140–400)
RBC: 4.92 10*6/uL (ref 4.20–5.82)
RDW: 13.7 % (ref 11.0–14.6)
WBC: 5.1 10*3/uL (ref 4.0–10.3)

## 2017-07-26 LAB — COMPREHENSIVE METABOLIC PANEL
ALT: 23 U/L (ref 0–55)
ANION GAP: 7 meq/L (ref 3–11)
AST: 27 U/L (ref 5–34)
Albumin: 3.6 g/dL (ref 3.5–5.0)
Alkaline Phosphatase: 302 U/L — ABNORMAL HIGH (ref 40–150)
BUN: 13.6 mg/dL (ref 7.0–26.0)
CHLORIDE: 106 meq/L (ref 98–109)
CO2: 27 meq/L (ref 22–29)
Calcium: 9.3 mg/dL (ref 8.4–10.4)
Creatinine: 1.3 mg/dL (ref 0.7–1.3)
GLUCOSE: 118 mg/dL (ref 70–140)
Potassium: 4.4 mEq/L (ref 3.5–5.1)
SODIUM: 139 meq/L (ref 136–145)
TOTAL PROTEIN: 7 g/dL (ref 6.4–8.3)
Total Bilirubin: 0.72 mg/dL (ref 0.20–1.20)

## 2017-07-26 MED ORDER — HYDROCODONE-HOMATROPINE 5-1.5 MG PO TABS: 1.0000 | ORAL_TABLET | Freq: Four times a day (QID) | ORAL | 0 refills | Status: DC | PRN

## 2017-07-26 MED ORDER — DENOSUMAB 120 MG/1.7ML ~~LOC~~ SOLN
120.0000 mg | Freq: Once | SUBCUTANEOUS | Status: AC
  Administered 2017-07-26: 120 mg via SUBCUTANEOUS
  Filled 2017-07-26: qty 1.7

## 2017-07-26 NOTE — Telephone Encounter (Signed)
Gave avs and calendar for November  °

## 2017-07-26 NOTE — Patient Instructions (Signed)
Denosumab injection What is this medicine? DENOSUMAB (den oh sue mab) slows bone breakdown. Prolia is used to treat osteoporosis in women after menopause and in men. Xgeva is used to prevent bone fractures and other bone problems caused by cancer bone metastases. Xgeva is also used to treat giant cell tumor of the bone. This medicine may be used for other purposes; ask your health care provider or pharmacist if you have questions. COMMON BRAND NAME(S): Prolia, XGEVA What should I tell my health care provider before I take this medicine? They need to know if you have any of these conditions: -dental disease -eczema -infection or history of infections -kidney disease or on dialysis -low blood calcium or vitamin D -malabsorption syndrome -scheduled to have surgery or tooth extraction -taking medicine that contains denosumab -thyroid or parathyroid disease -an unusual reaction to denosumab, other medicines, foods, dyes, or preservatives -pregnant or trying to get pregnant -breast-feeding How should I use this medicine? This medicine is for injection under the skin. It is given by a health care professional in a hospital or clinic setting. If you are getting Prolia, a special MedGuide will be given to you by the pharmacist with each prescription and refill. Be sure to read this information carefully each time. For Prolia, talk to your pediatrician regarding the use of this medicine in children. Special care may be needed. For Xgeva, talk to your pediatrician regarding the use of this medicine in children. While this drug may be prescribed for children as young as 13 years for selected conditions, precautions do apply. Overdosage: If you think you've taken too much of this medicine contact a poison control center or emergency room at once. Overdosage: If you think you have taken too much of this medicine contact a poison control center or emergency room at once. NOTE: This medicine is only for  you. Do not share this medicine with others. What if I miss a dose? It is important not to miss your dose. Call your doctor or health care professional if you are unable to keep an appointment. What may interact with this medicine? Do not take this medicine with any of the following medications: -other medicines containing denosumab This medicine may also interact with the following medications: -medicines that suppress the immune system -medicines that treat cancer -steroid medicines like prednisone or cortisone This list may not describe all possible interactions. Give your health care provider a list of all the medicines, herbs, non-prescription drugs, or dietary supplements you use. Also tell them if you smoke, drink alcohol, or use illegal drugs. Some items may interact with your medicine. What should I watch for while using this medicine? Visit your doctor or health care professional for regular checks on your progress. Your doctor or health care professional may order blood tests and other tests to see how you are doing. Call your doctor or health care professional if you get a cold or other infection while receiving this medicine. Do not treat yourself. This medicine may decrease your body's ability to fight infection. You should make sure you get enough calcium and vitamin D while you are taking this medicine, unless your doctor tells you not to. Discuss the foods you eat and the vitamins you take with your health care professional. See your dentist regularly. Brush and floss your teeth as directed. Before you have any dental work done, tell your dentist you are receiving this medicine. Do not become pregnant while taking this medicine or for 5 months after stopping   it. Women should inform their doctor if they wish to become pregnant or think they might be pregnant. There is a potential for serious side effects to an unborn child. Talk to your health care professional or pharmacist for more  information. What side effects may I notice from receiving this medicine? Side effects that you should report to your doctor or health care professional as soon as possible: -allergic reactions like skin rash, itching or hives, swelling of the face, lips, or tongue -breathing problems -chest pain -fast, irregular heartbeat -feeling faint or lightheaded, falls -fever, chills, or any other sign of infection -muscle spasms, tightening, or twitches -numbness or tingling -skin blisters or bumps, or is dry, peels, or red -slow healing or unexplained pain in the mouth or jaw -unusual bleeding or bruising Side effects that usually do not require medical attention (Report these to your doctor or health care professional if they continue or are bothersome.): -muscle pain -stomach upset, gas This list may not describe all possible side effects. Call your doctor for medical advice about side effects. You may report side effects to FDA at 1-800-FDA-1088. Where should I keep my medicine? This medicine is only given in a clinic, doctor's office, or other health care setting and will not be stored at home. NOTE: This sheet is a summary. It may not cover all possible information. If you have questions about this medicine, talk to your doctor, pharmacist, or health care provider.  2015, Elsevier/Gold Standard. (2012-02-28 12:37:47)  

## 2017-07-26 NOTE — Telephone Encounter (Signed)
Oncology Nurse Navigator Documentation  Oncology Nurse Navigator Flowsheets 07/26/2017  Navigator Location [facility]  Navigator Encounter Type Telephone;Clinic/MDC/I spoke with Mr. Hengel today in clinic.  He needs PET scan. I gave him the phone number of central scheduling to call. Mr. Melander called me back and stated 1st available is 11/23.  I called central scheduling and was given another date. I called Mr. Rodier back and left him a vm message this the appt time and place and pre-procedure instructions. I also left my name and phone number to call if he had any questions. I reached out to authorization coordinator about getting scan authorized.   Telephone Outgoing Call;Incoming Call  Treatment Phase Treatment  Barriers/Navigation Needs Coordination of Care;Education  Education Other  Interventions Education;Coordination of Care  Coordination of Care Appts  Education Method Verbal  Acuity Level 2  Time Spent with Patient 30

## 2017-07-26 NOTE — Progress Notes (Signed)
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  He had disease progression and development of EGFR T790M resistant mutation in April 2017.  PRIOR THERAPY: 1) Status post whole brain irradiation under the care Dr. Tammi Klippel completed 04/12/2014. 2) Gilotrif 40 mg po daily - therapy beginning 04/03/2014. Status post approximately 20 months of therapy discontinued secondary to disease progression and development of EGFR T790M resistant mutation.  3) palliative radiotherapy to the lytic lesion in the right clavicle.  CURRENT THERAPY: 1) Tagrisso 80 mg by mouth daily status post 18 months of treatment. First dose was given on 01/12/2016. 2) Xgeva 120 g subcutaneously on monthly basis.  INTERVAL HISTORY: Oscar Floyd 51 y.o. male returns to the clinic today for follow-up visit.  The patient is feeling fine today with no specific complaints except for the persistent cough and shortness of breath with exertion.  He was treated for pneumonia with several courses of antibiotics as well as steroids with no significant improvement in his condition.  He was seen recently by Dr. Elsworth Soho and had a bronchoscopy performed with biopsy of the left lower lobe suspicious inflammatory lesion and the final pathology was consistent with non-small cell carcinoma, adenocarcinoma.  The patient denied having any current fever or chills.  He has no nausea, vomiting, diarrhea or constipation.  He denied having any weight loss or night sweats.  He has no chest pain or hemoptysis.  He denied having any headache or visual  changes.  He is here today for evaluation and discussion of his treatment options based on the new findings.   MEDICAL HISTORY: Past Medical History:  Diagnosis Date  . Bone metastases ([place]) 08/26/2015  . Encounter for antineoplastic chemotherapy 01/06/2016  . Family history of cancer   . Hypertension   . Lung cancer ([place])    RUL lung with mets to liver, bone and brain    ALLERGIES:  has No Known Allergies.  MEDICATIONS:  Current Outpatient Medications  Medication Sig Dispense Refill  . albuterol (PROVENTIL HFA;VENTOLIN HFA) 108 (90 Base) MCG/ACT inhaler Inhale 2 puffs into the lungs every 6 (six) hours as needed for wheezing or shortness of breath. 1 Inhaler 6  . esomeprazole (NEXIUM) 20 MG capsule Take 20 mg daily by mouth.     . methylPREDNISolone (MEDROL DOSEPAK) 4 MG TBPK tablet Use as instructed. (Patient taking differently: Use as instructed. FILLED November 2 - 6 DAY SUPPLY 21 PILL PACK) 21 tablet 0  . mirtazapine (REMERON) 30 MG tablet Take 1 tablet (30 mg total) by mouth at bedtime. (Patient taking differently: Take 30 mg at bedtime as needed by mouth. ) 30 tablet 2  . naproxen sodium (ANAPROX) 220 MG tablet Take 220 mg 2 (two) times daily as needed by mouth.     Marland Kitchen osimertinib mesylate (TAGRISSO) 80 MG tablet Take 1 tablet (80 mg total) by mouth daily. 30 tablet 2   No current facility-administered medications for this visit.     SURGICAL HISTORY:  Past Surgical History:  Procedure Laterality Date  . VASECTOMY      REVIEW OF SYSTEMS:  Constitutional: negative  Eyes: negative Ears, nose, mouth, throat, and face: negative Respiratory: positive for cough and dyspnea on exertion Cardiovascular: negative Gastrointestinal: negative Genitourinary:negative Integument/breast: negative Hematologic/lymphatic: negative Musculoskeletal:negative Neurological: negative Behavioral/Psych: negative Endocrine: negative Allergic/Immunologic: negative   PHYSICAL EXAMINATION:  General appearance: alert, cooperative, appears stated age and no distress Head: Normocephalic, without obvious abnormality, atraumatic Neck: no adenopathy, no JVD, supple, symmetrical, trachea midline and thyroid not enlarged, symmetric, no tenderness/mass/nodules Lymph nodes: Cervical, supraclavicular, and axillary nodes normal. Resp: clear to auscultation bilaterally Back: symmetric, no curvature. ROM normal. No CVA tenderness. Cardio: regular rate and rhythm, S1, S2 normal, no murmur, click, rub or gallop GI: soft, non-tender; bowel sounds normal; no masses,  no organomegaly Extremities: extremities normal, atraumatic, no cyanosis or edema Neurologic: Alert and oriented X 3, normal strength and tone. Normal symmetric reflexes. Normal coordination and gait  ECOG PERFORMANCE STATUS: 1 - Symptomatic but completely ambulatory  Blood pressure 113/73, pulse 96, temperature 98.7 F (37.1 C), temperature source Oral, resp. rate 18, height 6' 2"  (1.88 m), weight 208 lb 6.4 oz (94.5 kg), SpO2 97 %.  LABORATORY DATA: Lab Results  Component Value Date   WBC 5.1 07/26/2017   HGB 14.9 07/26/2017   HCT 44.7 07/26/2017   MCV 90.9 07/26/2017   PLT 164 07/26/2017      Chemistry      Component Value Date/Time   NA 140 06/27/2017 0843   K 4.0 06/27/2017 0843   CO2 21 (L) 06/27/2017 0843   BUN 16.8 06/27/2017 0843   CREATININE 1.1 06/27/2017 0843      Component Value Date/Time   CALCIUM 8.9 06/27/2017 0843   ALKPHOS 112 06/27/2017 0843   AST 49 (H) 06/27/2017 0843   ALT 19 06/27/2017 0843   BILITOT 0.71 06/27/2017 0843       RADIOGRAPHIC STUDIES: Dg Chest 2 View  Result Date: 07/14/2017 CLINICAL DATA:  Cough, shortness of breath. EXAM: CHEST  2 VIEW COMPARISON:  CT scan of June 14, 2017. FINDINGS: The heart size and mediastinal contours are within normal limits. No pneumothorax or pleural effusion is noted. Nodule seen anteriorly on lateral projection which most likely corresponds to  right upper lobe nodule noted on prior CT scan. Left lower lobe opacity is noted concerning for pneumonia as described on prior CT. Sclerosis is noted in thoracic spine consistent with metastatic disease. IMPRESSION: Left lower lobe opacity is noted most consistent with pneumonia as described on prior CT scan. Anterior nodule seen on lateral projection which most likely corresponds to right upper lobe nodule noted on prior CT scan. Osseous metastases are again noted. Electronically Signed   By: Marijo Conception, M.D.   On: 07/14/2017 15:44   Mr [name] OJ Contrast  Result Date: 07/11/2017 CLINICAL DATA:  Continued surveillance metastatic disease. Non-small-cell lung cancer, whole-brain radiation 04/12/2014. EXAM: MRI HEAD WITHOUT AND WITH CONTRAST TECHNIQUE: Multiplanar, multiecho pulse sequences of the brain and surrounding structures were obtained without and with intravenous contrast. CONTRAST:  20m MULTIHANCE GADOBENATE DIMEGLUMINE 529 MG/ML IV SOLN COMPARISON:  03/04/2017 most recent. FINDINGS: Brain: No evidence for acute infarction, hemorrhage, mass lesion, hydrocephalus, or extra-axial fluid. Mild atrophy. No significant white matter disease. No residual active metastatic deposits are evident. No abnormal postcontrast enhancement is seen. No areas of vasogenic edema are observed. Vascular: Normal flow voids. Tiny focus of hemosiderin LEFT posterior frontal subcortical white matter. Skull and upper cervical spine: No progression of previously treated metastases. Sinuses/Orbits: No layering fluid.  Negative orbits. Other: None. IMPRESSION:  Stable exam. No residual active intracranial metastatic deposits are evident. Stable treated osseous metastases. Electronically Signed   By: Staci Righter M.D.   On: 07/11/2017 16:03   Dg Chest Port 1 View  Result Date: 07/20/2017 CLINICAL DATA:  Coughing.  Status post bronchoscopy with biopsy. EXAM: PORTABLE CHEST 1 VIEW COMPARISON:  07/14/2017 FINDINGS: The heart  size and mediastinal contours are within normal limits. Left lower lobe airspace disease shows no significant change. No evidence of pneumothorax or pleural effusion. Right lung remains clear. IMPRESSION: Stable left lower lobe airspace disease. No evidence of pneumothorax or pleural effusion. Electronically Signed   By: Earle Gell M.D.   On: 07/20/2017 09:08   Dg C-arm Bronchoscopy  Result Date: 07/20/2017 C-ARM BRONCHOSCOPY: Fluoroscopy was utilized by the requesting physician.  No radiographic interpretation.    ASSESSMENT AND PLAN: This is a very pleasant 51 years old white male with metastatic non-small cell lung cancer, adenocarcinoma with positive EGFR mutation with deletion in the 19 status post treatment with Gilotrif for 20 months discontinued secondary to disease progression and development of EGFR T790M resistant mutation. The patient is currently on treatment with Tagrisso 80 mg by mouth daily status post 18 months. He continues to tolerate his treatment with Tagrisso fairly well. Unfortunately the patient continues to complain of cough and shortness of breath.  The left lower lobe lung opacity is suspicious for disease progression. I ordered a PET scan for further evaluation of this lesion and to rule out any other progressive disease. We also sent the tissue block of the left lower lobe biopsy for molecular studies to see if the patient develops any other resistant mutation. I also discussed with the patient several options for management of his condition and of the final imaging studies are consistent with disease progression including changing treatment to systemic chemotherapy versus local radiotherapy to the progressive disease in the left lower lobe versus adding Avastin to his current treatment with Tagrisso. We will discuss these options in more details when I see the patient back for follow-up visit in 2 weeks for reevaluation after the availability of the molecular study and  PET scan results. For the cough, I started the patient on Hycodan every 6 hours as needed. He was advised to continue on [place] for now. The patient was advised to call immediately if he has any concerning symptoms in the interval. The patient voices understanding of current disease status and treatment options and is in agreement with the current care plan. All questions were answered. The patient knows to call the clinic with any problems, questions or concerns. We can certainly see the patient much sooner if necessary.  Disclaimer: This note was dictated with voice recognition software. Similar sounding words can inadvertently be transcribed and may not be corrected upon review.

## 2017-08-02 ENCOUNTER — Telehealth: Payer: Self-pay | Admitting: *Deleted

## 2017-08-02 ENCOUNTER — Other Ambulatory Visit: Payer: Self-pay | Admitting: Internal Medicine

## 2017-08-02 ENCOUNTER — Encounter: Payer: Self-pay | Admitting: *Deleted

## 2017-08-02 ENCOUNTER — Ambulatory Visit
Admission: RE | Admit: 2017-08-02 | Discharge: 2017-08-02 | Disposition: A | Discharge status: Home / Self Care | Payer: BLUE CROSS/BLUE SHIELD | Source: Ambulatory Visit | Attending: Internal Medicine | Admitting: Internal Medicine

## 2017-08-02 DIAGNOSIS — C3491 Malignant neoplasm of unspecified part of right bronchus or lung: Secondary | ICD-10-CM | POA: Insufficient documentation

## 2017-08-02 DIAGNOSIS — C7951 Secondary malignant neoplasm of bone: Secondary | ICD-10-CM | POA: Insufficient documentation

## 2017-08-02 DIAGNOSIS — R918 Other nonspecific abnormal finding of lung field: Secondary | ICD-10-CM | POA: Diagnosis not present

## 2017-08-02 DIAGNOSIS — C7931 Secondary malignant neoplasm of brain: Secondary | ICD-10-CM

## 2017-08-02 DIAGNOSIS — C7949 Secondary malignant neoplasm of other parts of nervous system: Secondary | ICD-10-CM

## 2017-08-02 LAB — GLUCOSE, CAPILLARY: Glucose-Capillary: 81 mg/dL (ref 65–99)

## 2017-08-02 MED ORDER — FLUDEOXYGLUCOSE F - 18 (FDG) INJECTION
13.2000 | Freq: Once | INTRAVENOUS | Status: AC | PRN
  Administered 2017-08-02: 13.2 via INTRAVENOUS

## 2017-08-02 NOTE — Progress Notes (Signed)
Oncology Nurse Navigator Documentation  Oncology Nurse Navigator Flowsheets 08/02/2017  Navigator Location [facility]  Navigator Encounter Type Other/I followed up on Mr. Kenney's appt.  I notified scheduling to call and schedule him for his Xgeva on 08/09/17.   Treatment Phase Treatment  Barriers/Navigation Needs Coordination of Care  Interventions Coordination of Care  Coordination of Care Other  Acuity Level 1  Time Spent with Patient 15

## 2017-08-02 NOTE — Telephone Encounter (Signed)
Oncology Nurse Navigator Documentation  Oncology Nurse Navigator Flowsheets 08/02/2017  Navigator Location [facility]  Navigator Encounter Type Telephone  Telephone Outgoing Call/per Dr. Julien Nordmann, I called patient and updated on his PET scan.  Also, I gave him a choice of next steps per Dr. Julien Nordmann. Patient would like ct biopsy. I updated Dr. Julien Nordmann who ordered procedure.  I contacted pathology dept to help expedite.    Treatment Phase Treatment  Barriers/Navigation Needs Education;Coordination of Care  Education Other  Interventions Coordination of Care;Education  Coordination of Care Other  Education Method Verbal  Acuity Level 2  Acuity Level 2 Assistance expediting appointments;Other  Time Spent with Patient 45

## 2017-08-03 ENCOUNTER — Encounter (HOSPITAL_COMMUNITY): Payer: Self-pay

## 2017-08-05 ENCOUNTER — Encounter: Payer: Self-pay | Admitting: *Deleted

## 2017-08-05 NOTE — Progress Notes (Signed)
Oncology Nurse Navigator Documentation  Oncology Nurse Navigator Flowsheets 08/05/2017  Navigator Location [facility]  Navigator Encounter Type Other/I contacted scheduling to call Mr. Hall Busing and schedule his Xgeva appt.   Treatment Phase Treatment  Barriers/Navigation Needs Coordination of Care  Interventions Coordination of Care  Coordination of Care Other  Acuity Level 1  Time Spent with Patient 15

## 2017-08-05 NOTE — Progress Notes (Signed)
Oncology Nurse Navigator Documentation  Oncology Nurse Navigator Flowsheets 08/05/2017  Navigator Location [facility]  Navigator Encounter Type Other/I called central scheduling dept to see if Oscar Floyd could get his ct biopsy earlier than 11/29.  I was told there is no earlier times.    Treatment Phase Treatment  Barriers/Navigation Needs Coordination of Care  Interventions Coordination of Care  Coordination of Care Other  Acuity Level 1  Time Spent with Patient 15

## 2017-08-09 ENCOUNTER — Telehealth: Payer: Self-pay | Admitting: Internal Medicine

## 2017-08-09 ENCOUNTER — Telehealth: Payer: Self-pay | Admitting: *Deleted

## 2017-08-09 ENCOUNTER — Other Ambulatory Visit: Payer: BLUE CROSS/BLUE SHIELD

## 2017-08-09 ENCOUNTER — Ambulatory Visit: Payer: BLUE CROSS/BLUE SHIELD | Admitting: Oncology

## 2017-08-09 NOTE — Telephone Encounter (Signed)
Oncology Nurse Navigator Documentation  Oncology Nurse Navigator Flowsheets 08/09/2017  Navigator Location [facility]  Navigator Encounter Type Telephone/Mr. Valladares called.  He had a question about his schedule, I explained. He was thankful for the help.   Telephone Incoming Call  Treatment Phase Treatment  Barriers/Navigation Needs Education  Education Other  Interventions Education  Education Method Verbal  Acuity Level 1  Time Spent with Patient 15

## 2017-08-09 NOTE — Telephone Encounter (Signed)
Spoke with patient and scheduled inj per 11/23 sch msg.

## 2017-08-10 ENCOUNTER — Other Ambulatory Visit: Payer: Self-pay | Admitting: Radiology

## 2017-08-10 ENCOUNTER — Other Ambulatory Visit: Payer: Self-pay | Admitting: Student

## 2017-08-10 ENCOUNTER — Other Ambulatory Visit: Payer: Self-pay | Admitting: Medical Oncology

## 2017-08-10 DIAGNOSIS — C7931 Secondary malignant neoplasm of brain: Secondary | ICD-10-CM

## 2017-08-10 DIAGNOSIS — Z5111 Encounter for antineoplastic chemotherapy: Secondary | ICD-10-CM

## 2017-08-10 DIAGNOSIS — C3491 Malignant neoplasm of unspecified part of right bronchus or lung: Secondary | ICD-10-CM

## 2017-08-10 DIAGNOSIS — C7949 Secondary malignant neoplasm of other parts of nervous system: Secondary | ICD-10-CM

## 2017-08-10 DIAGNOSIS — C7951 Secondary malignant neoplasm of bone: Secondary | ICD-10-CM

## 2017-08-10 MED ORDER — OSIMERTINIB MESYLATE 80 MG PO TABS: 80.0000 mg | ORAL_TABLET | Freq: Every day | ORAL | 1 refills | Status: DC

## 2017-08-11 ENCOUNTER — Ambulatory Visit (HOSPITAL_COMMUNITY)
Admission: RE | Admit: 2017-08-11 | Discharge: 2017-08-11 | Disposition: A | Discharge status: Home / Self Care | Payer: BLUE CROSS/BLUE SHIELD | Source: Ambulatory Visit | Attending: Interventional Radiology | Admitting: Interventional Radiology

## 2017-08-11 ENCOUNTER — Ambulatory Visit (HOSPITAL_COMMUNITY)
Admission: RE | Admit: 2017-08-11 | Discharge: 2017-08-11 | Disposition: A | Discharge status: Home / Self Care | Payer: BLUE CROSS/BLUE SHIELD | Source: Ambulatory Visit | Attending: Internal Medicine | Admitting: Internal Medicine

## 2017-08-11 ENCOUNTER — Encounter (HOSPITAL_COMMUNITY): Payer: Self-pay

## 2017-08-11 DIAGNOSIS — C7949 Secondary malignant neoplasm of other parts of nervous system: Secondary | ICD-10-CM

## 2017-08-11 DIAGNOSIS — I10 Essential (primary) hypertension: Secondary | ICD-10-CM | POA: Insufficient documentation

## 2017-08-11 DIAGNOSIS — Z79899 Other long term (current) drug therapy: Secondary | ICD-10-CM | POA: Insufficient documentation

## 2017-08-11 DIAGNOSIS — Z823 Family history of stroke: Secondary | ICD-10-CM | POA: Insufficient documentation

## 2017-08-11 DIAGNOSIS — C3411 Malignant neoplasm of upper lobe, right bronchus or lung: Secondary | ICD-10-CM | POA: Insufficient documentation

## 2017-08-11 DIAGNOSIS — Z9889 Other specified postprocedural states: Secondary | ICD-10-CM

## 2017-08-11 DIAGNOSIS — C3432 Malignant neoplasm of lower lobe, left bronchus or lung: Secondary | ICD-10-CM | POA: Diagnosis not present

## 2017-08-11 DIAGNOSIS — Z801 Family history of malignant neoplasm of trachea, bronchus and lung: Secondary | ICD-10-CM | POA: Diagnosis not present

## 2017-08-11 DIAGNOSIS — Z800 Family history of malignant neoplasm of digestive organs: Secondary | ICD-10-CM | POA: Insufficient documentation

## 2017-08-11 DIAGNOSIS — C7951 Secondary malignant neoplasm of bone: Secondary | ICD-10-CM | POA: Diagnosis not present

## 2017-08-11 DIAGNOSIS — Z8249 Family history of ischemic heart disease and other diseases of the circulatory system: Secondary | ICD-10-CM | POA: Diagnosis not present

## 2017-08-11 DIAGNOSIS — C7931 Secondary malignant neoplasm of brain: Secondary | ICD-10-CM

## 2017-08-11 DIAGNOSIS — C787 Secondary malignant neoplasm of liver and intrahepatic bile duct: Secondary | ICD-10-CM | POA: Insufficient documentation

## 2017-08-11 DIAGNOSIS — Z808 Family history of malignant neoplasm of other organs or systems: Secondary | ICD-10-CM | POA: Diagnosis not present

## 2017-08-11 DIAGNOSIS — C3491 Malignant neoplasm of unspecified part of right bronchus or lung: Secondary | ICD-10-CM

## 2017-08-11 DIAGNOSIS — R918 Other nonspecific abnormal finding of lung field: Secondary | ICD-10-CM | POA: Diagnosis present

## 2017-08-11 LAB — CBC
HEMATOCRIT: 40.4 % (ref 39.0–52.0)
HEMOGLOBIN: 13.9 g/dL (ref 13.0–17.0)
MCH: 30.7 pg (ref 26.0–34.0)
MCHC: 34.4 g/dL (ref 30.0–36.0)
MCV: 89.2 fL (ref 78.0–100.0)
Platelets: 171 10*3/uL (ref 150–400)
RBC: 4.53 MIL/uL (ref 4.22–5.81)
RDW: 13.9 % (ref 11.5–15.5)
WBC: 3.6 10*3/uL — AB (ref 4.0–10.5)

## 2017-08-11 LAB — APTT: aPTT: 30 seconds (ref 24–36)

## 2017-08-11 LAB — PROTIME-INR
INR: 0.99
Prothrombin Time: 13 seconds (ref 11.4–15.2)

## 2017-08-11 MED ORDER — MIDAZOLAM HCL 2 MG/2ML IJ SOLN
INTRAMUSCULAR | Status: AC | PRN
  Administered 2017-08-11 (×3): 1 mg via INTRAVENOUS

## 2017-08-11 MED ORDER — FENTANYL CITRATE (PF) 100 MCG/2ML IJ SOLN
INTRAMUSCULAR | Status: AC | PRN
  Administered 2017-08-11 (×2): 50 ug via INTRAVENOUS

## 2017-08-11 MED ORDER — SODIUM CHLORIDE 0.9 % IV SOLN
INTRAVENOUS | Status: AC | PRN
  Administered 2017-08-11: 10 mL/h via INTRAVENOUS

## 2017-08-11 MED ORDER — LIDOCAINE HCL 1 % IJ SOLN
INTRAMUSCULAR | Status: AC
Start: 2017-08-11 — End: 2017-08-11
  Filled 2017-08-11: qty 20

## 2017-08-11 MED ORDER — FENTANYL CITRATE (PF) 100 MCG/2ML IJ SOLN
INTRAMUSCULAR | Status: AC
  Filled 2017-08-11: qty 4

## 2017-08-11 MED ORDER — MIDAZOLAM HCL 2 MG/2ML IJ SOLN
INTRAMUSCULAR | Status: AC
  Filled 2017-08-11: qty 4

## 2017-08-11 MED ORDER — SODIUM CHLORIDE 0.9 % IV SOLN: INTRAVENOUS | Status: DC

## 2017-08-11 NOTE — Discharge Instructions (Addendum)
Needle Biopsy of the Lung, Care After °This sheet gives you information about how to care for yourself after your procedure. Your health care provider may also give you more specific instructions. If you have problems or questions, contact your health care provider. °What can I expect after the procedure? °After the procedure, it is common to have: °· Soreness, pain, and tenderness where a tissue sample was taken (biopsy site). °· A cough. °· A sore throat. ° °Follow these instructions at home: °Biopsy site care °· Follow instructions from your health care provider about when to remove the bandage that was placed on the biopsy site. °· Keep the bandage dry until it has been removed. °· Check your biopsy site every day for signs of infection. Check for: °? More redness, swelling, or pain. °? More fluid or blood. °? Warmth to the touch. °? Pus or a bad smell. °General instructions °· Rest as directed by your health care provider. Ask your health care provider what activities are safe for you. °· Do not take baths, swim, or use a hot tub until your health care provider approves. °· Take over-the-counter and prescription medicines only as told by your health care provider. °· If you have airplane travel scheduled, talk with your health care provider about when it is safe for you to travel by airplane. °· It is up to you to get the results of your procedure. Ask your health care provider, or the department that is doing the procedure, when your results will be ready. °· Keep all follow-up visits as told by your health care provider. This is important. °Contact a health care provider if: °· You have more redness, swelling, or pain around your biopsy site. °· You have more fluid or blood coming from your biopsy site. °· Your biopsy site feels warm to the touch. °· You have pus or a bad smell coming from your biopsy site. °· You have a fever. °· You have pain that does not get better with medicine. °Get help right away  if: °· You have problems breathing. °· You have chest pain. °· You cough up blood. °· You faint. °· You have a fast heart rate. °Summary °· After a needle biopsy of the lung, it is common to have a cough, a sore throat, or soreness, pain, and tenderness where a tissue sample was taken (biopsy site). °· You should check your biopsy area every day for signs of infection, including pus or a bad smell, warmth, more fluid or blood, or more redness, swelling, or pain. °· You should not take baths, swim, or use a hot tub until your health care provider approves. °· It is up to you to get the results of your procedure. Ask your health care provider, or the department that is doing the procedure, when your results will be ready. °This information is not intended to replace advice given to you by your health care provider. Make sure you discuss any questions you have with your health care provider. °Document Released: 06/27/2007 Document Revised: 07/21/2016 Document Reviewed: 07/21/2016 °Elsevier Interactive Patient Education © 2017 [employer] ° °Moderate Conscious Sedation, Adult, Care After °These instructions provide you with information about caring for yourself after your procedure. Your health care provider may also give you more specific instructions. Your treatment has been planned according to current medical practices, but problems sometimes occur. Call your health care provider if you have any problems or questions after your procedure. °What can I expect after the   procedure? °After your procedure, it is common: °· To feel sleepy for several hours. °· To feel clumsy and have poor balance for several hours. °· To have poor judgment for several hours. °· To vomit if you eat too soon. ° °Follow these instructions at home: °For at least 24 hours after the procedure: ° °· Do not: °? Participate in activities where you could fall or become injured. °? Drive. °? Use heavy machinery. °? Drink alcohol. °? Take sleeping  pills or medicines that cause drowsiness. °? Make important decisions or sign legal documents. °? Take care of children on your own. °· Rest. °Eating and drinking °· Follow the diet recommended by your health care provider. °· If you vomit: °? Drink water, juice, or soup when you can drink without vomiting. °? Make sure you have little or no nausea before eating solid foods. °General instructions °· Have a responsible adult stay with you until you are awake and alert. °· Take over-the-counter and prescription medicines only as told by your health care provider. °· If you smoke, do not smoke without supervision. °· Keep all follow-up visits as told by your health care provider. This is important. °Contact a health care provider if: °· You keep feeling nauseous or you keep vomiting. °· You feel light-headed. °· You develop a rash. °· You have a fever. °Get help right away if: °· You have trouble breathing. °This information is not intended to replace advice given to you by your health care provider. Make sure you discuss any questions you have with your health care provider. °Document Released: 06/20/2013 Document Revised: 02/02/2016 Document Reviewed: 12/20/2015 °Elsevier Interactive Patient Education © 2018 [employer] ° °

## 2017-08-11 NOTE — Procedures (Signed)
Interventional Radiology Procedure Note  Procedure: CT guided biopsy of LLL masslike consolidation Complications: No immediate Recommendations: - Bedrest until CXR cleared.  Minimize talking, coughing or otherwise straining.  - Follow up 2 hr CXR pending   Signed,  Criselda Peaches, MD

## 2017-08-11 NOTE — Sedation Documentation (Signed)
Called to give report. Nurse unavailable. Will call back 

## 2017-08-11 NOTE — H&P (Signed)
Chief Complaint: Patient was seen in consultation today for lung biopsy  Referring Physician(s): Mohamed,Mohamed  Supervising Physician: Jacqulynn Cadet  Patient Status: [facility] - Out-pt  History of Present Illness: Oscar Floyd is a 51 y.o. male with past medical history of lung cancer diagnosed in 2015 now with bone, liver, and brain metastasis.  He recently underwent CT Chest for increasing shortness of breath and persistent cough.  CT Chest 06/15/17 showed: 1. New area of airspace consolidation in the left lower lobe concerning for left lower lobe pneumonia. 2. The primary lesion in the inferior aspect of the right upper lobe is similar in size to the prior examination. Most previously noted small pulmonary nodules are stable compared to the prior study, with a few exceptions which have shown minimal growth. Widespread metastatic disease to the bones is also similar compared to prior examinations. No new sites of metastatic disease are noted on today's study in the abdomen or pelvis. 3. Additional incidental findings, as above.  PET Scan 08/02/17 showed: 1. Interval enlargement of consolidative process in the LEFT lower lobe with differential including worsening pneumonia versus tumor progression. Favor lung cancer recurrence. Consider bronchoscopy. 2. Multiple small lung nodules. Some nodules have mild associated metabolic activity. 3. No evidence hypermetabolic nodal metastasis. 4. Several regions of hypermetabolic activity remain within the skeleton on the background of diffuse skeletal metastasis. Favor smoldering residual carcinoma within the skeleton.  IR consulted for lung mass biopsy at the request of Dr. Julien Nordmann.   Patient presents for procedure today in his usual state of health.  He has been NPO.  He does not take blood thinners.   Past Medical History:  Diagnosis Date  . Bone metastases ([place]) 08/26/2015  . Encounter for antineoplastic chemotherapy 01/06/2016  .  Family history of cancer   . Hypertension   . Lung cancer ([place])    RUL lung with mets to liver, bone and brain    Past Surgical History:  Procedure Laterality Date  . VASECTOMY    . VIDEO BRONCHOSCOPY Bilateral 03/20/2014   Procedure: VIDEO BRONCHOSCOPY WITH FLUORO;  Surgeon: Rigoberto Noel, MD;  Location: [facility];  Service: Cardiopulmonary;  Laterality: Bilateral;  . VIDEO BRONCHOSCOPY Bilateral 07/20/2017   Procedure: VIDEO BRONCHOSCOPY WITH FLUORO;  Surgeon: Rigoberto Noel, MD;  Location: [facility];  Service: Cardiopulmonary;  Laterality: Bilateral;    Allergies: Patient has no known allergies.  Medications: Prior to Admission medications   Medication Sig Start Date End Date Taking? Authorizing Provider  albuterol (PROVENTIL HFA;VENTOLIN HFA) 108 (90 Base) MCG/ACT inhaler Inhale 2 puffs into the lungs every 6 (six) hours as needed for wheezing or shortness of breath. 07/15/17  Yes Rigoberto Noel, MD  esomeprazole (NEXIUM) 20 MG capsule Take 20 mg daily by mouth.  04/21/12  Yes [provider]  Hydrocodone-Homatropine 5-1.5 MG TABS Take 1 tablet 4 (four) times daily as needed by mouth. 07/26/17  Yes [name], MD  mirtazapine (REMERON) 30 MG tablet Take 1 tablet (30 mg total) by mouth at bedtime. Patient taking differently: Take 30 mg at bedtime as needed by mouth.  02/07/15  Yes [name], MD  naproxen sodium (ANAPROX) 220 MG tablet Take 220 mg 2 (two) times daily as needed by mouth.    Yes [provider]  osimertinib mesylate (TAGRISSO) 80 MG tablet Take 1 tablet (80 mg total) by mouth daily. 08/10/17  Yes [name], MD     Family History  Problem Relation Age of Onset  .  Cancer Father 34       liposarcoma age 21  . Cancer Mother 23       gist  . Cancer Paternal Grandfather        lung cancer; heavy smoker; also had abdominal cancer in 40's  . Stroke Maternal Grandmother   . Heart disease Maternal Grandfather   . Cancer Other         multiple brothers of PGF with unknown cancer    Social History   Socioeconomic History  . Marital status: Married    Spouse name: None  . Number of children: 2  . Years of education: None  . Highest education level: None  Social Needs  . Financial resource strain: None  . Food insecurity - worry: None  . Food insecurity - inability: None  . Transportation needs - medical: None  . Transportation needs - non-medical: None  Occupational History  . None  Tobacco Use  . Smoking status: Never Smoker  . Smokeless tobacco: Never Used  Substance and Sexual Activity  . Alcohol use: Yes    Comment: socially  . Drug use: No  . Sexual activity: Yes  Other Topics Concern  . None  Social History Narrative  . None    Review of Systems  Constitutional: Negative for fatigue and fever.  Respiratory: Positive for cough and shortness of breath.   Cardiovascular: Negative for chest pain.  Gastrointestinal: Negative for abdominal pain.  Psychiatric/Behavioral: Negative for behavioral problems and confusion.    Vital Signs: BP 128/86   Pulse 74   Temp 98.5 F (36.9 C) (Oral)   Resp 16   Ht 6\' 2"  (1.88 m)   Wt 208 lb (94.3 kg)   SpO2 98%   BMI 26.71 kg/m   Physical Exam  Constitutional: He is oriented to person, place, and time. He appears well-developed.  Cardiovascular: Normal rate, regular rhythm and normal heart sounds.  Pulmonary/Chest: Effort normal and breath sounds normal. No respiratory distress.  Abdominal: Soft.  Neurological: He is alert and oriented to person, place, and time.  Skin: Skin is warm and dry.  Psychiatric: He has a normal mood and affect. His behavior is normal. Judgment and thought content normal.  Nursing note and vitals reviewed.   Imaging: Dg Chest 2 View  Result Date: 07/14/2017 CLINICAL DATA:  Cough, shortness of breath. EXAM: CHEST  2 VIEW COMPARISON:  CT scan of June 14, 2017. FINDINGS: The heart size and mediastinal contours are within  normal limits. No pneumothorax or pleural effusion is noted. Nodule seen anteriorly on lateral projection which most likely corresponds to right upper lobe nodule noted on prior CT scan. Left lower lobe opacity is noted concerning for pneumonia as described on prior CT. Sclerosis is noted in thoracic spine consistent with metastatic disease. IMPRESSION: Left lower lobe opacity is noted most consistent with pneumonia as described on prior CT scan. Anterior nodule seen on lateral projection which most likely corresponds to right upper lobe nodule noted on prior CT scan. Osseous metastases are again noted. Electronically Signed   By: Marijo Conception, M.D.   On: 07/14/2017 15:44   Nm Pet Image Restag (ps) Skull Base To Thigh  Result Date: 08/02/2017 CLINICAL DATA:  Subsequent treatment strategy for bronchogenic carcinoma. Non-small cell lung adenocarcinoma. EXAM: NUCLEAR MEDICINE PET SKULL BASE TO THIGH TECHNIQUE: 13.2 mCi F-18 FDG was injected intravenously. Full-ring PET imaging was performed from the skull base to thigh after the radiotracer. CT data was obtained and  used for attenuation correction and anatomic localization. FASTING BLOOD GLUCOSE:  Value: 81 mg/dl COMPARISON:  PET-CT 03/25/2014 FINDINGS: NECK No hypermetabolic lymph nodes in the neck. CHEST Peripheral consolidation in the LEFT lower lobe is increased in volume compared to CT 06/14/2017. Consolidation is intensely hypermetabolic with SUV max equals 6.8. Nodule in the RIGHT upper lobe measuring 15 mm on image 113, series 3 does not have associated metabolic activity. 6 mm RIGHT lower lobe nodule does mild associated metabolic activity. There are bilateral nodules which are worrisome for metastasis. No hypermetabolic mediastinal nodes or axillary nodes. ABDOMEN/PELVIS No abnormal radiotracer activity in the liver. Adrenal glands are normal. SKELETON There is extensive sclerotic lesions throughout the axillary and appendicular skeleton. Majority  these lesions are not metabolically active. The degree of metabolic activity is much less than comparison PET-CT scan however, there are several regions of hypermetabolic radiotracer activity within skeleton. For example in the RIGHT sacral ala with SUV max equals 6.3. Proximal RIGHT humerus with SUV max equal 3.6. T12 vertebral body body on the RIGHT with SUV max equal 4.8. IMPRESSION: 1. Interval enlargement of consolidative process in the LEFT lower lobe with differential including worsening pneumonia versus tumor progression. Favor lung cancer recurrence. Consider bronchoscopy. 2. Multiple small lung nodules. Some nodules have mild associated metabolic activity. 3. No evidence hypermetabolic nodal metastasis. 4. Several regions of hypermetabolic activity remain within the skeleton on the background of diffuse skeletal metastasis. Favor smoldering residual carcinoma within the skeleton. Electronically Signed   By: Suzy Bouchard M.D.   On: 08/02/2017 13:02   Dg Chest Port 1 View  Result Date: 07/20/2017 CLINICAL DATA:  Coughing.  Status post bronchoscopy with biopsy. EXAM: PORTABLE CHEST 1 VIEW COMPARISON:  07/14/2017 FINDINGS: The heart size and mediastinal contours are within normal limits. Left lower lobe airspace disease shows no significant change. No evidence of pneumothorax or pleural effusion. Right lung remains clear. IMPRESSION: Stable left lower lobe airspace disease. No evidence of pneumothorax or pleural effusion. Electronically Signed   By: Earle Gell M.D.   On: 07/20/2017 09:08   Dg C-arm Bronchoscopy  Result Date: 07/20/2017 C-ARM BRONCHOSCOPY: Fluoroscopy was utilized by the requesting physician.  No radiographic interpretation.    Labs:  CBC: Recent Labs    05/26/17 1336 06/27/17 0842 07/26/17 0845 08/11/17 0642  WBC 4.3 3.2* 5.1 3.6*  HGB 13.8 13.3 14.9 13.9  HCT 41.0 39.5 44.7 40.4  PLT 146 131* 164 171    COAGS: No results for input(s): INR, APTT in the last 8760  hours.  BMP: Recent Labs    04/28/17 0901 05/26/17 1336 06/27/17 0843 07/26/17 0845  NA 141 140 140 139  K 4.4 4.4 4.0 4.4  CO2 25 26 21* 27  GLUCOSE 116 90 89 118  BUN 15.2 11.7 16.8 13.6  CALCIUM 9.1 9.5 8.9 9.3  CREATININE 1.2 1.2 1.1 1.3    LIVER FUNCTION TESTS: Recent Labs    04/28/17 0901 05/26/17 1336 06/27/17 0843 07/26/17 0845  BILITOT 0.68 0.64 0.71 0.72  AST 30 27 49* 27  ALT 18 16 19 23   ALKPHOS 97 133 112 302*  PROT 6.7 7.0 6.1* 7.0  ALBUMIN 3.7 3.8 3.4* 3.6    TUMOR MARKERS: No results for input(s): AFPTM, CEA, CA199, CHROMGRNA in the last 8760 hours.  Assessment and Plan: Patient with past medical history of lung cancer associated with widespread metastatic disease presents with complaint of new lung mass.  IR consulted for lung mass biopsy at the  request of Dr. Julien Nordmann. Case reviewed by Dr. Vernard Gambles who approves patient for procedure.  Patient presents today in their usual state of health.  He has been NPO and is not currently on blood thinners.  Risks and benefits discussed with the patient including, but not limited to bleeding, hemoptysis, respiratory failure requiring intubation, infection, pneumothorax requiring chest tube placement, stroke from air embolism or even death. All of the patient's questions were answered, patient is agreeable to proceed. Consent signed and in chart.  Thank you for this interesting consult.  I greatly enjoyed meeting Oscar Floyd and look forward to participating in their care.  A copy of this report was sent to the requesting provider on this date.  Electronically Signed: Docia Barrier, PA 08/11/2017, 7:48 AM   I spent a total of  30 Minutes   in face to face in clinical consultation, greater than 50% of which was counseling/coordinating care for lung mass.

## 2017-08-12 ENCOUNTER — Encounter: Payer: Self-pay | Admitting: *Deleted

## 2017-08-12 ENCOUNTER — Ambulatory Visit: Payer: BLUE CROSS/BLUE SHIELD | Admitting: Pulmonary Disease

## 2017-08-12 LAB — ACID FAST SMEAR (AFB, MYCOBACTERIA): Acid Fast Smear: NEGATIVE

## 2017-08-12 NOTE — Progress Notes (Signed)
Oncology Nurse Navigator Documentation  Oncology Nurse Navigator Flowsheets 08/12/2017  Navigator Location [facility]  Navigator Encounter Type Other/per Dr. Worthy Flank request, I called [facility] and requested foundation one testing on recent biopsy.   Treatment Phase Treatment  Barriers/Navigation Needs Coordination of Care  Education Other  Interventions Coordination of Care  Coordination of Care Other  Acuity Level 2  Time Spent with Patient 15

## 2017-08-16 LAB — AEROBIC/ANAEROBIC CULTURE (SURGICAL/DEEP WOUND): CULTURE: NO GROWTH

## 2017-08-16 LAB — AEROBIC/ANAEROBIC CULTURE W GRAM STAIN (SURGICAL/DEEP WOUND)

## 2017-08-18 ENCOUNTER — Other Ambulatory Visit: Payer: Self-pay | Admitting: Medical Oncology

## 2017-08-18 DIAGNOSIS — C7949 Secondary malignant neoplasm of other parts of nervous system: Secondary | ICD-10-CM

## 2017-08-18 DIAGNOSIS — Z5111 Encounter for antineoplastic chemotherapy: Secondary | ICD-10-CM

## 2017-08-18 DIAGNOSIS — C3491 Malignant neoplasm of unspecified part of right bronchus or lung: Secondary | ICD-10-CM

## 2017-08-18 DIAGNOSIS — C7931 Secondary malignant neoplasm of brain: Secondary | ICD-10-CM

## 2017-08-18 DIAGNOSIS — C7951 Secondary malignant neoplasm of bone: Secondary | ICD-10-CM

## 2017-08-18 MED ORDER — OSIMERTINIB MESYLATE 80 MG PO TABS: 80.0000 mg | ORAL_TABLET | Freq: Every day | ORAL | 1 refills | Status: DC

## 2017-08-19 LAB — FUNGUS CULTURE RESULT

## 2017-08-19 LAB — FUNGUS CULTURE WITH STAIN

## 2017-08-19 LAB — FUNGAL ORGANISM REFLEX

## 2017-08-23 ENCOUNTER — Other Ambulatory Visit: Payer: Self-pay | Admitting: Internal Medicine

## 2017-08-23 ENCOUNTER — Encounter: Payer: Self-pay | Admitting: *Deleted

## 2017-08-23 MED ORDER — HYDROCODONE-HOMATROPINE 5-1.5 MG PO TABS: 1.0000 | ORAL_TABLET | Freq: Four times a day (QID) | ORAL | 0 refills | Status: DC | PRN

## 2017-08-23 NOTE — Progress Notes (Signed)
Oncology Nurse Navigator Documentation  Oncology Nurse Navigator Flowsheets 08/23/2017  Navigator Location [facility]  Navigator Encounter Type Telephone/I received a vm message from patient.  I called him back. He needed a refill on cough medication. I listened as he explained. I updated Dr. Julien Nordmann. He prescribed. I took prescription to triage nurse.  I updated patient.   Telephone Outgoing Call  Treatment Phase Treatment  Barriers/Navigation Needs Coordination of Care  Interventions Coordination of Care  Coordination of Care Other  Acuity Level 2  Time Spent with Patient 30

## 2017-08-25 ENCOUNTER — Other Ambulatory Visit: Payer: BLUE CROSS/BLUE SHIELD

## 2017-08-25 ENCOUNTER — Other Ambulatory Visit: Payer: Self-pay | Admitting: Medical Oncology

## 2017-08-25 DIAGNOSIS — C3491 Malignant neoplasm of unspecified part of right bronchus or lung: Secondary | ICD-10-CM

## 2017-08-26 ENCOUNTER — Other Ambulatory Visit (HOSPITAL_BASED_OUTPATIENT_CLINIC_OR_DEPARTMENT_OTHER): Payer: BLUE CROSS/BLUE SHIELD

## 2017-08-26 ENCOUNTER — Ambulatory Visit (HOSPITAL_BASED_OUTPATIENT_CLINIC_OR_DEPARTMENT_OTHER): Payer: BLUE CROSS/BLUE SHIELD

## 2017-08-26 VITALS — BP 124/80 | HR 87 | Temp 98.0°F

## 2017-08-26 DIAGNOSIS — C3411 Malignant neoplasm of upper lobe, right bronchus or lung: Secondary | ICD-10-CM

## 2017-08-26 DIAGNOSIS — C7951 Secondary malignant neoplasm of bone: Secondary | ICD-10-CM | POA: Diagnosis not present

## 2017-08-26 DIAGNOSIS — C3400 Malignant neoplasm of unspecified main bronchus: Secondary | ICD-10-CM

## 2017-08-26 DIAGNOSIS — C3491 Malignant neoplasm of unspecified part of right bronchus or lung: Secondary | ICD-10-CM

## 2017-08-26 LAB — CBC WITH DIFFERENTIAL/PLATELET
BASO%: 0.2 % (ref 0.0–2.0)
BASOS ABS: 0 10*3/uL (ref 0.0–0.1)
EOS%: 2.8 % (ref 0.0–7.0)
Eosinophils Absolute: 0.1 10*3/uL (ref 0.0–0.5)
HCT: 40.9 % (ref 38.4–49.9)
HGB: 13.8 g/dL (ref 13.0–17.1)
LYMPH%: 26.2 % (ref 14.0–49.0)
MCH: 29.7 pg (ref 27.2–33.4)
MCHC: 33.7 g/dL (ref 32.0–36.0)
MCV: 88.1 fL (ref 79.3–98.0)
MONO#: 0.4 10*3/uL (ref 0.1–0.9)
MONO%: 9.1 % (ref 0.0–14.0)
NEUT#: 2.6 10*3/uL (ref 1.5–6.5)
NEUT%: 61.7 % (ref 39.0–75.0)
PLATELETS: 149 10*3/uL (ref 140–400)
RBC: 4.64 10*6/uL (ref 4.20–5.82)
RDW: 13.6 % (ref 11.0–14.6)
WBC: 4.3 10*3/uL (ref 4.0–10.3)
lymph#: 1.1 10*3/uL (ref 0.9–3.3)

## 2017-08-26 LAB — COMPREHENSIVE METABOLIC PANEL
ALT: 17 U/L (ref 0–55)
ANION GAP: 11 meq/L (ref 3–11)
AST: 29 U/L (ref 5–34)
Albumin: 3.1 g/dL — ABNORMAL LOW (ref 3.5–5.0)
Alkaline Phosphatase: 339 U/L — ABNORMAL HIGH (ref 40–150)
BUN: 10.5 mg/dL (ref 7.0–26.0)
CALCIUM: 8.6 mg/dL (ref 8.4–10.4)
CHLORIDE: 110 meq/L — AB (ref 98–109)
CO2: 19 meq/L — AB (ref 22–29)
CREATININE: 1.1 mg/dL (ref 0.7–1.3)
EGFR: >60 mL/min/{1.73_m2} (ref >60)
Glucose: 114 mg/dl (ref 70–140)
POTASSIUM: 4.1 meq/L (ref 3.5–5.1)
Sodium: 140 mEq/L (ref 136–145)
Total Bilirubin: 0.62 mg/dL (ref 0.20–1.20)
Total Protein: 6.4 g/dL (ref 6.4–8.3)

## 2017-08-26 LAB — RESEARCH LABS

## 2017-08-26 MED ORDER — DENOSUMAB 120 MG/1.7ML ~~LOC~~ SOLN
SUBCUTANEOUS | Status: AC
  Filled 2017-08-26: qty 1.7

## 2017-08-26 MED ORDER — DENOSUMAB 120 MG/1.7ML ~~LOC~~ SOLN
120.0000 mg | Freq: Once | SUBCUTANEOUS | Status: AC
  Administered 2017-08-26: 120 mg via SUBCUTANEOUS

## 2017-08-29 ENCOUNTER — Encounter: Payer: Self-pay | Admitting: *Deleted

## 2017-08-29 NOTE — Progress Notes (Signed)
Oncology Nurse Navigator Documentation  Oncology Nurse Navigator Flowsheets 08/29/2017  Navigator Location [facility]  Navigator Encounter Type Letter/Fax/Email/I received a message from patient's wife. She asked if I had heard from patient. I have not and update her. She states he was not feeling well with SOB and fatigue. I asked that he call [facility] triage for assistance.   Treatment Phase Treatment  Barriers/Navigation Needs Education  Education Other  Interventions Education  Education Method Written  Acuity Level 1  Time Spent with Patient 15

## 2017-08-30 ENCOUNTER — Telehealth: Payer: Self-pay | Admitting: *Deleted

## 2017-08-30 ENCOUNTER — Telehealth: Payer: Self-pay | Admitting: Medical

## 2017-08-30 NOTE — Telephone Encounter (Signed)
Oncology Nurse Navigator Documentation  Oncology Nurse Navigator Flowsheets 08/30/2017  Navigator Location [facility]  Navigator Encounter Type Telephone/I received a vm message from patient. I called him back. I called him back.  He states he is not feeling well with SOB.  I asked if he would like to be seen today. He states he was working today but would like to be seen tomorrow. I updated Dr. Julien Nordmann and Lucianne Lei PA for [facility].  I also completed scheduling request for patient to be called with an appt   Telephone Incoming Call;Outgoing Call  Treatment Phase Treatment  Barriers/Navigation Needs Education;Coordination of Care  Education Other  Interventions Education;Coordination of Care  Coordination of Care Appts  Education Method Verbal  Acuity Level 2  Acuity Level 2 Educational needs;Assistance expediting appointments  Time Spent with Patient 58

## 2017-08-30 NOTE — Telephone Encounter (Signed)
Scheduled appt per 12/18 sch message- patient is aware of appt date and time.

## 2017-08-31 ENCOUNTER — Ambulatory Visit (HOSPITAL_BASED_OUTPATIENT_CLINIC_OR_DEPARTMENT_OTHER): Payer: BLUE CROSS/BLUE SHIELD | Admitting: Medical

## 2017-08-31 ENCOUNTER — Other Ambulatory Visit (HOSPITAL_BASED_OUTPATIENT_CLINIC_OR_DEPARTMENT_OTHER): Payer: BLUE CROSS/BLUE SHIELD

## 2017-08-31 VITALS — BP 114/67 | HR 85 | Temp 97.9°F | Resp 16 | Ht 74.0 in | Wt 214.1 lb

## 2017-08-31 DIAGNOSIS — C3411 Malignant neoplasm of upper lobe, right bronchus or lung: Secondary | ICD-10-CM | POA: Diagnosis not present

## 2017-08-31 DIAGNOSIS — C787 Secondary malignant neoplasm of liver and intrahepatic bile duct: Secondary | ICD-10-CM | POA: Diagnosis not present

## 2017-08-31 DIAGNOSIS — C7951 Secondary malignant neoplasm of bone: Secondary | ICD-10-CM

## 2017-08-31 DIAGNOSIS — C3491 Malignant neoplasm of unspecified part of right bronchus or lung: Secondary | ICD-10-CM | POA: Diagnosis not present

## 2017-08-31 DIAGNOSIS — R0602 Shortness of breath: Secondary | ICD-10-CM

## 2017-08-31 DIAGNOSIS — R05 Cough: Secondary | ICD-10-CM | POA: Diagnosis not present

## 2017-08-31 DIAGNOSIS — C7931 Secondary malignant neoplasm of brain: Secondary | ICD-10-CM

## 2017-08-31 DIAGNOSIS — R059 Cough, unspecified: Secondary | ICD-10-CM

## 2017-08-31 LAB — COMPREHENSIVE METABOLIC PANEL
ALT: 17 U/L (ref 0–55)
ANION GAP: 6 meq/L (ref 3–11)
AST: 31 U/L (ref 5–34)
Albumin: 3 g/dL — ABNORMAL LOW (ref 3.5–5.0)
Alkaline Phosphatase: 350 U/L — ABNORMAL HIGH (ref 40–150)
BILIRUBIN TOTAL: 0.5 mg/dL (ref 0.20–1.20)
BUN: 14.7 mg/dL (ref 7.0–26.0)
CO2: 26 meq/L (ref 22–29)
Calcium: 8.8 mg/dL (ref 8.4–10.4)
Chloride: 110 mEq/L — ABNORMAL HIGH (ref 98–109)
Creatinine: 1.2 mg/dL (ref 0.7–1.3)
Glucose: 106 mg/dl (ref 70–140)
Potassium: 4.4 mEq/L (ref 3.5–5.1)
Sodium: 141 mEq/L (ref 136–145)
TOTAL PROTEIN: 6.2 g/dL — AB (ref 6.4–8.3)

## 2017-08-31 MED ORDER — PREDNISONE 10 MG (48) PO TBPK: ORAL_TABLET | ORAL | 0 refills | Status: DC

## 2017-08-31 MED ORDER — FLUTICASONE PROPIONATE (INHAL) 100 MCG/BLIST IN AEPB: 2.0000 | INHALATION_SPRAY | Freq: Two times a day (BID) | RESPIRATORY_TRACT | 5 refills | Status: DC

## 2017-08-31 MED ORDER — ALBUTEROL SULFATE HFA 108 (90 BASE) MCG/ACT IN AERS: 2.0000 | INHALATION_SPRAY | Freq: Four times a day (QID) | RESPIRATORY_TRACT | 6 refills | Status: AC | PRN

## 2017-08-31 NOTE — Progress Notes (Signed)
[facility]pt[facility] Progress Note   Oscar Floyd [id number] 03-29-1966 51 y.o.  Oscar Floyd is managed by Dr. Eilleen Kempf  Actively treated with chemotherapy: yes, Tagrisso  Assessment: Plan:    Cough - Plan: albuterol (PROVENTIL HFA;VENTOLIN HFA) 108 (90 Base) MCG/ACT inhaler, predniSONE (STERAPRED UNI-PAK 48 TAB) 10 MG (48) TBPK tablet, Fluticasone Propionate, Inhal, 100 MCG/BLIST AEPB  Shortness of breath - Plan: albuterol (PROVENTIL HFA;VENTOLIN HFA) 108 (90 Base) MCG/ACT inhaler, predniSONE (STERAPRED UNI-PAK 48 TAB) 10 MG (48) TBPK tablet, Fluticasone Propionate, Inhal, 100 MCG/BLIST AEPB  Non-small cell cancer of right lung (HCC)   Cough and shortness of breath: Patient was given a prescription for fluticasone inhaler and a prednisone taper.  He was given a refill of his albuterol.  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  Molecular studies are pending.  Dr. Julien Nordmann will contact the patient when these results are available for discussion of future treatment options.  Please see After Visit Summary for patient specific instructions.  No future appointments.  No orders of the defined types were placed in this encounter.      Subjective:   Patient ID:  Oscar Floyd is a 51 y.o. (DOB [birth year]) male.  Chief Complaint: No chief complaint on file.   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  He presented with a right upper lung mass in addition to extensive liver, brain, and bone metastasis.  He had disease progression and development of EGFR T790M resistant mutation in April 2017.  The patient received whole  brain radiation which she completed on 04/12/2014.  He was treated with Gilotrif 40 mg daily beginning on 04/03/2014.  He completed approximately 20 months of therapy with therapy did not continue secondary to disease progression and development of a EGFR T790M resistant mutation.  He was treated with palliative radiotherapy therapy to a lytic lesion of the right clavicle.  He is currently treated with jTagrisso 80 mg daily and is status post 18 months of therapy.  He was first dozed on 01/12/2016.  He continues to receive Xgeva 120 mcg subcutaneously monthly.  He was last seen by Dr. Earlie Server on 07/26/2017.  He is status post a repeat lung biopsy completed on 08/11/2017 with molecular studies pending.  He presents to the clinic today with a report of progressive shortness of breath and a nonproductive cough which have been increasing in severity over the last 2-3 weeks.  He notes increasing shortness of breath with activity such as walking upstairs or walking for distances and with talking.  Medications: I have reviewed the patient's current medications.  Allergies: No Known Allergies  Past Medical History:  Diagnosis Date  . Bone metastases ([place]) 08/26/2015  . Encounter for antineoplastic chemotherapy 01/06/2016  . Family history of cancer   . Hypertension   . Lung cancer ([place])    RUL lung with mets to liver, bone and brain    Past Surgical History:  Procedure Laterality Date  . VASECTOMY    . VIDEO BRONCHOSCOPY Bilateral 03/20/2014   Procedure: VIDEO BRONCHOSCOPY WITH FLUORO;  Surgeon: Rigoberto Noel, MD;  Location: [facility];  Service: Cardiopulmonary;  Laterality: Bilateral;  . VIDEO BRONCHOSCOPY Bilateral 07/20/2017   Procedure: VIDEO BRONCHOSCOPY WITH FLUORO;  Surgeon: Rigoberto Noel, MD;  Location: [place];  Service: Cardiopulmonary;  Laterality: Bilateral;    Family History  Problem Relation Age of Onset  . Cancer Father 30       liposarcoma age 40  . Cancer Mother 79        gist  . Cancer Paternal Grandfather        lung cancer; heavy smoker; also had abdominal cancer in 54's  . Stroke Maternal Grandmother   . Heart disease Maternal Grandfather   . Cancer Other        multiple brothers of PGF with unknown cancer    Social History   Socioeconomic History  . Marital status: Married    Spouse name: Not on file  . Number of children: 2  . Years of education: Not on file  . Highest education level: Not on file  Social Needs  . Financial resource strain: Not on file  . Food insecurity - worry: Not on file  . Food insecurity - inability: Not on file  . Transportation needs - medical: Not on file  . Transportation needs - non-medical: Not on file  Occupational History  . Not on file  Tobacco Use  . Smoking status: Never Smoker  . Smokeless tobacco: Never Used  Substance and Sexual Activity  . Alcohol use: Yes    Comment: socially  . Drug use: No  . Sexual activity: Yes  Other Topics Concern  . Not on file  Social History Narrative  . Not on file    Past Medical History, Surgical history, Social history, and Family history were reviewed and updated as appropriate.   Please see review of systems for further details on the patient's review from today.   Review of Systems:  Review of Systems  Constitutional: Positive for activity change. Negative for chills, diaphoresis and fever.  HENT: Negative for congestion, rhinorrhea and trouble swallowing.   Respiratory: Positive for cough and shortness of breath. Negative for chest tightness.   Cardiovascular: Negative for chest pain and leg swelling.    Objective:   Physical Exam:  BP 114/67 (BP Location: Right Arm, Patient Position: Sitting)   Pulse 85   Temp 97.9 F (36.6 C) (Oral)   Resp 16   Ht 6' 2"  (1.88 m)   Wt 214 lb 1.6 oz (97.1 kg)   SpO2 97%   BMI 27.49 kg/m  ECOG: 1  Physical Exam  Constitutional: No distress.  HENT:  Head: Normocephalic and atraumatic.  Mouth/Throat:  Oropharynx is clear and moist. No oropharyngeal exudate.  Eyes: Right eye exhibits no discharge. Left eye exhibits no discharge. No scleral icterus.  Neck: Normal range of motion.  Cardiovascular: Normal rate and normal heart sounds. Exam reveals no gallop and no friction rub.  No murmur heard. Pulmonary/Chest: Effort normal and breath sounds normal. No respiratory distress. He has no wheezes. He has no rales.  Lymphadenopathy:    He has no cervical adenopathy.  Neurological: He is alert.  Skin: Skin is warm and dry. He is not diaphoretic.  Psychiatric: He has a normal mood and affect. His behavior is normal. Judgment and thought content normal.    Lab Review:     Component Value Date/Time   NA 141 08/31/2017 0841   K 4.4 08/31/2017 0841   CO2 26 08/31/2017 0841   GLUCOSE 106 08/31/2017 0841   BUN 14.7 08/31/2017 0841  CREATININE 1.2 08/31/2017 0841   CALCIUM 8.8 08/31/2017 0841   PROT 6.2 (L) 08/31/2017 0841   ALBUMIN 3.0 (L) 08/31/2017 0841   AST 31 08/31/2017 0841   ALT 17 08/31/2017 0841   ALKPHOS 350 (H) 08/31/2017 0841   BILITOT 0.50 08/31/2017 0841       Component Value Date/Time   WBC 4.3 08/26/2017 0816   WBC 3.6 (L) 08/11/2017 0642   RBC 4.64 08/26/2017 0816   RBC 4.53 08/11/2017 0642   HGB 13.8 08/26/2017 0816   HCT 40.9 08/26/2017 0816   PLT 149 08/26/2017 0816   MCV 88.1 08/26/2017 0816   MCH 29.7 08/26/2017 0816   MCH 30.7 08/11/2017 0642   MCHC 33.7 08/26/2017 0816   MCHC 34.4 08/11/2017 0642   RDW 13.6 08/26/2017 0816   LYMPHSABS 1.1 08/26/2017 0816   MONOABS 0.4 08/26/2017 0816   EOSABS 0.1 08/26/2017 0816   BASOSABS 0.0 08/26/2017 0816   -------------------------------  Imaging from last 24 hours (if applicable):  Radiology interpretation: Nm Pet Image Restag (ps) Skull Base To Thigh  Result Date: 08/02/2017 CLINICAL DATA:  Subsequent treatment strategy for bronchogenic carcinoma. Non-small cell lung adenocarcinoma. EXAM: NUCLEAR MEDICINE  PET SKULL BASE TO THIGH TECHNIQUE: 13.2 mCi F-18 FDG was injected intravenously. Full-ring PET imaging was performed from the skull base to thigh after the radiotracer. CT data was obtained and used for attenuation correction and anatomic localization. FASTING BLOOD GLUCOSE:  Value: 81 mg/dl COMPARISON:  PET-CT 03/25/2014 FINDINGS: NECK No hypermetabolic lymph nodes in the neck. CHEST Peripheral consolidation in the LEFT lower lobe is increased in volume compared to CT 06/14/2017. Consolidation is intensely hypermetabolic with SUV max equals 6.8. Nodule in the RIGHT upper lobe measuring 15 mm on image 113, series 3 does not have associated metabolic activity. 6 mm RIGHT lower lobe nodule does mild associated metabolic activity. There are bilateral nodules which are worrisome for metastasis. No hypermetabolic mediastinal nodes or axillary nodes. ABDOMEN/PELVIS No abnormal radiotracer activity in the liver. Adrenal glands are normal. SKELETON There is extensive sclerotic lesions throughout the axillary and appendicular skeleton. Majority these lesions are not metabolically active. The degree of metabolic activity is much less than comparison PET-CT scan however, there are several regions of hypermetabolic radiotracer activity within skeleton. For example in the RIGHT sacral ala with SUV max equals 6.3. Proximal RIGHT humerus with SUV max equal 3.6. T12 vertebral body body on the RIGHT with SUV max equal 4.8. IMPRESSION: 1. Interval enlargement of consolidative process in the LEFT lower lobe with differential including worsening pneumonia versus tumor progression. Favor lung cancer recurrence. Consider bronchoscopy. 2. Multiple small lung nodules. Some nodules have mild associated metabolic activity. 3. No evidence hypermetabolic nodal metastasis. 4. Several regions of hypermetabolic activity remain within the skeleton on the background of diffuse skeletal metastasis. Favor smoldering residual carcinoma within the  skeleton. Electronically Signed   By: Suzy Bouchard M.D.   On: 08/02/2017 13:02   Ct Biopsy  Result Date: 08/11/2017 INDICATION: 51 year old male with a history of non-small-cell cancer of the right lung with metastatic disease. He has developed new left lower lobe masslike consolidation which has persisted in demonstrates low level metabolic activity on PET-CT. This is concerning for persistent or atypical pneumonia versus malignancy. Biopsy is warranted for tissue diagnosis. EXAM: CT-guided biopsy left lower lobe pulmonary nodule Interventional Radiologist:  Criselda Peaches, MD MEDICATIONS: None. ANESTHESIA/SEDATION: Fentanyl 100 mcg IV; Versed 3 mg IV Moderate Sedation Time:  12 minutes The patient was  continuously monitored during the procedure by the interventional radiology nurse under my direct supervision. FLUOROSCOPY TIME:  Fluoroscopy Time: 0 minutes 0 seconds (0 mGy). COMPLICATIONS: None immediate. Estimated blood loss:  0 PROCEDURE: Informed written consent was obtained from the patient after a thorough discussion of the procedural risks, benefits and alternatives. All questions were addressed. Maximal Sterile Barrier Technique was utilized including caps, mask, sterile gowns, sterile gloves, sterile drape, hand hygiene and skin antiseptic. A timeout was performed prior to the initiation of the procedure. A planning axial CT scan was performed. The region of masslike consolidation in the left lower lobe was successfully identified. A suitable skin entry site was selected and marked. The region was then sterilely prepped and draped in standard fashion using Betadine skin prep. Local anesthesia was attained by infiltration with 1% lidocaine. A small dermatotomy was made. Under intermittent CT fluoroscopic guidance, a 17 gauge trocar needle was advanced into the lung and positioned at the margin of the nodule. Multiple 18 gauge core biopsies were then coaxially obtained using the BioPince  automated biopsy device. Biopsy specimens were placed in formalin and delivered to pathology for further analysis. A bio centri device was applied. The biopsy device and introducer needle were removed. Post biopsy axial CT imaging demonstrates no evidence of immediate complication. There is no pneumothorax. The patient tolerated the procedure well. IMPRESSION: Technically successful CT-guided biopsy left lower lobe masslike consolidation. Signed, Criselda Peaches, MD [facility] Rad[facility] Electronically Signed   By: Jacqulynn Cadet M.D.   On: 08/11/2017 11:28   Dg Chest Port 1 View  Result Date: 08/11/2017 CLINICAL DATA:  Post left lower lobe biopsy. EXAM: PORTABLE CHEST 1 VIEW COMPARISON:  07/20/2017. FINDINGS: Airspace disease in the left lower lobe. No pneumothorax following biopsy. Heart is upper limits normal in size. No visible effusions. IMPRESSION: Left lower lobe airspace opacity.  No pneumothorax following biopsy. Electronically Signed   By: Rolm Baptise M.D.   On: 08/11/2017 11:13 March 2014 status post    This patient was seen with Dr. Julien Nordmann with my treatment plan reviewed with him. He expressed agreement with my medical management of this patient.  ADDENDUM: Hematology/Oncology Attending: I had a face-to-face encounter with the patient.  I recommended his care plan.  This is a very pleasant 51 years old white male with metastatic non-small cell lung cancer, adenocarcinoma with positive for EGFR mutation diagnosed in July 2015 status post treatment with gentle treat if discontinued secondary to development of T790M resistant mutation and the patient was a started on treatment with Tagrisso 80 mg p.o. daily status post 18 months and has been tolerating this treatment fairly well but unfortunately the patient developed disease progression in the left lower lobe.  He has worsening shortness of breath.  He underwent CT-guided core biopsy  of the left lower lobe lesion for molecular studies which is a still pending.  The results are expected in the next few days. He presented to the symptom management clinic for the shortness of breath.  He also has dry cough. I recommended for the patient to start Medrol Dosepak for now.  It is likely that the patient will need to start systemic chemotherapy soon if he has no other actionable mutation. We will call him with the results of the molecular studies and follow-up visit for discussion of his treatment options once the results become available. He will also continue on Hycodan for the cough. The patient was  advised to call immediately if he has any concerning symptoms in the interval.  Disclaimer: This note was dictated with voice recognition software. Similar sounding words can inadvertently be transcribed and may be missed upon review. Eilleen Kempf, MD 09/03/17

## 2017-09-03 ENCOUNTER — Encounter: Payer: Self-pay | Admitting: Medical

## 2017-09-04 LAB — ACID FAST CULTURE WITH REFLEXED SENSITIVITIES: ACID FAST CULTURE - AFSCU3: NEGATIVE

## 2017-09-05 ENCOUNTER — Telehealth: Payer: Self-pay | Admitting: Internal Medicine

## 2017-09-05 ENCOUNTER — Ambulatory Visit (HOSPITAL_BASED_OUTPATIENT_CLINIC_OR_DEPARTMENT_OTHER): Payer: BLUE CROSS/BLUE SHIELD | Admitting: Internal Medicine

## 2017-09-05 ENCOUNTER — Encounter: Payer: Self-pay | Admitting: Internal Medicine

## 2017-09-05 VITALS — BP 130/83 | HR 80 | Temp 98.1°F | Resp 19 | Ht 74.0 in | Wt 213.6 lb

## 2017-09-05 DIAGNOSIS — Z5111 Encounter for antineoplastic chemotherapy: Secondary | ICD-10-CM

## 2017-09-05 DIAGNOSIS — R05 Cough: Secondary | ICD-10-CM | POA: Diagnosis not present

## 2017-09-05 DIAGNOSIS — Z5112 Encounter for antineoplastic immunotherapy: Secondary | ICD-10-CM

## 2017-09-05 DIAGNOSIS — C7802 Secondary malignant neoplasm of left lung: Secondary | ICD-10-CM

## 2017-09-05 DIAGNOSIS — Z7189 Other specified counseling: Secondary | ICD-10-CM

## 2017-09-05 DIAGNOSIS — C7951 Secondary malignant neoplasm of bone: Secondary | ICD-10-CM

## 2017-09-05 DIAGNOSIS — G620 Drug-induced polyneuropathy: Secondary | ICD-10-CM

## 2017-09-05 DIAGNOSIS — J189 Pneumonia, unspecified organism: Secondary | ICD-10-CM

## 2017-09-05 DIAGNOSIS — C3411 Malignant neoplasm of upper lobe, right bronchus or lung: Secondary | ICD-10-CM

## 2017-09-05 DIAGNOSIS — C3491 Malignant neoplasm of unspecified part of right bronchus or lung: Secondary | ICD-10-CM

## 2017-09-05 MED ORDER — DEXAMETHASONE 4 MG PO TABS: ORAL_TABLET | ORAL | 1 refills | Status: DC

## 2017-09-05 MED ORDER — PROCHLORPERAZINE MALEATE 10 MG PO TABS: 10.0000 mg | ORAL_TABLET | Freq: Four times a day (QID) | ORAL | 0 refills | Status: DC | PRN

## 2017-09-05 MED ORDER — FOLIC ACID 1 MG PO TABS: 1.0000 mg | ORAL_TABLET | Freq: Every day | ORAL | 4 refills | Status: DC

## 2017-09-05 MED ORDER — CYANOCOBALAMIN 1000 MCG/ML IJ SOLN
INTRAMUSCULAR | Status: AC
  Filled 2017-09-05: qty 1

## 2017-09-05 MED ORDER — CYANOCOBALAMIN 1000 MCG/ML IJ SOLN
1000.0000 ug | Freq: Once | INTRAMUSCULAR | Status: AC
  Administered 2017-09-05: 1000 ug via INTRAMUSCULAR

## 2017-09-05 NOTE — Progress Notes (Signed)
START ON PATHWAY REGIMEN - Non-Small Cell Lung     A cycle is every 21 days:     Pembrolizumab      Pemetrexed      Carboplatin   **Always confirm dose/schedule in your pharmacy ordering system**    Patient Characteristics: Stage IV Metastatic, Nonsquamous, Initial Chemotherapy/Immunotherapy, PS = 0, 1, PD-L1 Expression Positive 1-49% (TPS) / Negative / Not Tested / Awaiting Test Results AJCC T Category: T2a Current Disease Status: Distant Metastases AJCC N Category: N0 AJCC M Category: M1c AJCC 8 Stage Grouping: IVB Histology: Nonsquamous Cell ROS1 Rearrangement Status: Negative T790M Mutation Status: Positive Following EGFR TKI Other Mutations/Biomarkers: No Other Actionable Mutations PD-L1 Expression Status: Did Not Order Test Chemotherapy/Immunotherapy LOT: Initial Chemotherapy/Immunotherapy Molecular Targeted Therapy: Not Appropriate ALK Translocation Status: Negative Would you be surprised if this patient died  in the next year<= I would NOT be surprised if this patient died in the next year EGFR Mutation Status: Non-Sensitizing BRAF [id number] Mutation Status: Negative Performance Status: PS = 0, 1 Intent of Therapy: Non-Curative / Palliative Intent, Discussed with Patient

## 2017-09-05 NOTE — Progress Notes (Signed)
[place] [phone]   [phone]  OFFICE PROGRESS NOTE  [name], MD [address] [place] [address]  DIAGNOSIS: stage IV (T2a, N0, M1b) non-small cell lung cancer consistent with poorly differentiated adenocarcinoma with positive EGFR mutation with deletion in exon 19, diagnosed in July of 2015 and presented with right upper lobe lung mass in addition to extensive liver, brain and bone metastases.  He had disease progression and development of EGFR T790M resistant mutation in April 2017. 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  PRIOR THERAPY: 1) Status post whole brain irradiation under the care Dr. Tammi Klippel completed 04/12/2014. 2) Gilotrif 40 mg po daily - therapy beginning 04/03/2014. Status post approximately 20 months of therapy discontinued secondary to disease progression and development of EGFR T790M resistant mutation.  3) palliative radiotherapy to the lytic lesion in the right clavicle. 4) Tagrisso 80 mg by mouth daily status post 19 months of treatment. First dose was given on 01/12/2016.  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  CURRENT THERAPY: 1) systemic chemotherapy with carboplatin for AC of 5, Alimta 500 mg/M2 and Keytruda 200 mg IV every 3 weeks.  First dose September 09, 2017. 2) Xgeva 120 g subcutaneously on monthly basis.  INTERVAL HISTORY: Oscar Floyd 51 y.o. male returns to the clinic today for follow-up visit accompanied by his wife.  The patient is feeling much better today with less shortness of breath after he was a started on the Medrol Dosepak and inhalers.  He continues to have shortness of breath with exertion.  He denied having any current chest pain but has dry cough with no hemoptysis.  He is currently on Hycodan for cough.  The patient denied having any recent weight loss or night  sweats.  He has no nausea, vomiting, diarrhea or constipation.  He denied having any fever or chills.  He has no headache or visual changes.  He had repeat CT guided core biopsy of the progressive left lower lobe lung mass by interventional radiology and the final pathology was consistent with adenocarcinoma.  The tissue block was [facility] molecular studies and the final report indicated development of new resistant EGFR mutation C797S.  The patient is here today for evaluation and discussion of his treatment options in view of the new findings.   MEDICAL HISTORY: Past Medical History:  Diagnosis Date  . Bone metastases ([place]) 08/26/2015  . Encounter for antineoplastic chemotherapy 01/06/2016  . Family history of cancer   . Hypertension   . Lung cancer ([place])    RUL lung with mets to liver, bone and brain    ALLERGIES:  has No Known Allergies.  MEDICATIONS:  Current Outpatient Medications  Medication Sig Dispense Refill  . albuterol (PROVENTIL HFA;VENTOLIN HFA) 108 (90 Base) MCG/ACT inhaler Inhale 2 puffs into the lungs every 6 (six) hours as needed for wheezing or shortness of breath. 1 Inhaler 6  . ALPRAZolam (XANAX) 1 MG tablet Take 0.5-1 mg by mouth.    . diazepam (VALIUM) 10 MG tablet Take 10 mg by mouth.    . docusate sodium (COLACE) 100 MG capsule Take 100 mg by mouth daily.    Marland Kitchen esomeprazole (NEXIUM) 20 MG capsule Take 20 mg daily by mouth.     . fexofenadine (ALLEGRA) 180 MG tablet Take 180 mg by mouth.    . Fluticasone  Propionate, Inhal, 100 MCG/BLIST AEPB Inhale 2 puffs into the lungs 2 (two) times daily. 60 each 5  . Hydrocodone-Homatropine 5-1.5 MG TABS Take 1 tablet by mouth 4 (four) times daily as needed. 60 each 0  . ibuprofen (ADVIL,MOTRIN) 800 MG tablet Take by mouth.    . mirtazapine (REMERON) 30 MG tablet Take 1 tablet (30 mg total) by mouth at bedtime. (Patient taking differently: Take 30 mg at bedtime as needed by mouth. ) 30 tablet 2  . naproxen sodium  (ANAPROX) 220 MG tablet Take 220 mg 2 (two) times daily as needed by mouth.     Marland Kitchen osimertinib mesylate (TAGRISSO) 80 MG tablet Take 1 tablet (80 mg total) by mouth daily. 30 tablet 1  . predniSONE (STERAPRED UNI-PAK 48 TAB) 10 MG (48) TBPK tablet 6 tabs x 2 days, 5 tabs x 2 days, 4 tabs x 2 days, 3 tabs x 2 days, 2 tabs x 2 days, 1 tab x 2 days 42 tablet 0  . pseudoephedrine-guaifenesin (MUCINEX D) 60-600 MG 12 hr tablet Take by mouth.     No current facility-administered medications for this visit.     SURGICAL HISTORY:  Past Surgical History:  Procedure Laterality Date  . VASECTOMY    . VIDEO BRONCHOSCOPY Bilateral 03/20/2014   Procedure: VIDEO BRONCHOSCOPY WITH FLUORO;  Surgeon: Rigoberto Noel, MD;  Location: [facility];  Service: Cardiopulmonary;  Laterality: Bilateral;  . VIDEO BRONCHOSCOPY Bilateral 07/20/2017   Procedure: VIDEO BRONCHOSCOPY WITH FLUORO;  Surgeon: Rigoberto Noel, MD;  Location: [facility];  Service: Cardiopulmonary;  Laterality: Bilateral;    REVIEW OF SYSTEMS:  Constitutional: positive for fatigue Eyes: negative Ears, nose, mouth, throat, and face: negative Respiratory: positive for cough and dyspnea on exertion Cardiovascular: negative Gastrointestinal: negative Genitourinary:negative Integument/breast: negative Hematologic/lymphatic: negative Musculoskeletal:negative Neurological: negative Behavioral/Psych: negative Endocrine: negative Allergic/Immunologic: negative   PHYSICAL EXAMINATION: General appearance: alert, cooperative, appears stated age, fatigued and no distress Head: Normocephalic, without obvious abnormality, atraumatic Neck: no adenopathy, no JVD, supple, symmetrical, trachea midline and thyroid not enlarged, symmetric, no tenderness/mass/nodules Lymph nodes: Cervical, supraclavicular, and axillary nodes normal. Resp: clear to auscultation bilaterally Back: symmetric, no curvature. ROM normal. No CVA tenderness. Cardio: regular rate and  rhythm, S1, S2 normal, no murmur, click, rub or gallop GI: soft, non-tender; bowel sounds normal; no masses,  no organomegaly Extremities: extremities normal, atraumatic, no cyanosis or edema Neurologic: Alert and oriented X 3, normal strength and tone. Normal symmetric reflexes. Normal coordination and gait  ECOG PERFORMANCE STATUS: 1 - Symptomatic but completely ambulatory  Blood pressure 130/83, pulse 80, temperature 98.1 F (36.7 C), temperature source Oral, resp. rate 19, height 6' 2"  (1.88 m), weight 213 lb 9.6 oz (96.9 kg), SpO2 98 %.  LABORATORY DATA: Lab Results  Component Value Date   WBC 4.3 08/26/2017   HGB 13.8 08/26/2017   HCT 40.9 08/26/2017   MCV 88.1 08/26/2017   PLT 149 08/26/2017      Chemistry      Component Value Date/Time   NA 141 08/31/2017 0841   K 4.4 08/31/2017 0841   CO2 26 08/31/2017 0841   BUN 14.7 08/31/2017 0841   CREATININE 1.2 08/31/2017 0841      Component Value Date/Time   CALCIUM 8.8 08/31/2017 0841   ALKPHOS 350 (H) 08/31/2017 0841   AST 31 08/31/2017 0841   ALT 17 08/31/2017 0841   BILITOT 0.50 08/31/2017 0841       RADIOGRAPHIC STUDIES: Ct Biopsy  Result Date:  08/11/2017 INDICATION: 52 year old male with a history of non-small-cell cancer of the right lung with metastatic disease. He has developed new left lower lobe masslike consolidation which has persisted in demonstrates low level metabolic activity on PET-CT. This is concerning for persistent or atypical pneumonia versus malignancy. Biopsy is warranted for tissue diagnosis. EXAM: CT-guided biopsy left lower lobe pulmonary nodule Interventional Radiologist:  Criselda Peaches, MD MEDICATIONS: None. ANESTHESIA/SEDATION: Fentanyl 100 mcg IV; Versed 3 mg IV Moderate Sedation Time:  12 minutes The patient was continuously monitored during the procedure by the interventional radiology nurse under my direct supervision. FLUOROSCOPY TIME:  Fluoroscopy Time: 0 minutes 0 seconds (0 mGy).  COMPLICATIONS: None immediate. Estimated blood loss:  0 PROCEDURE: Informed written consent was obtained from the patient after a thorough discussion of the procedural risks, benefits and alternatives. All questions were addressed. Maximal Sterile Barrier Technique was utilized including caps, mask, sterile gowns, sterile gloves, sterile drape, hand hygiene and skin antiseptic. A timeout was performed prior to the initiation of the procedure. A planning axial CT scan was performed. The region of masslike consolidation in the left lower lobe was successfully identified. A suitable skin entry site was selected and marked. The region was then sterilely prepped and draped in standard fashion using Betadine skin prep. Local anesthesia was attained by infiltration with 1% lidocaine. A small dermatotomy was made. Under intermittent CT fluoroscopic guidance, a 17 gauge trocar needle was advanced into the lung and positioned at the margin of the nodule. Multiple 18 gauge core biopsies were then coaxially obtained using the BioPince automated biopsy device. Biopsy specimens were placed in formalin and delivered to pathology for further analysis. A bio centri device was applied. The biopsy device and introducer needle were removed. Post biopsy axial CT imaging demonstrates no evidence of immediate complication. There is no pneumothorax. The patient tolerated the procedure well. IMPRESSION: Technically successful CT-guided biopsy left lower lobe masslike consolidation. Signed, Criselda Peaches, MD [facility] Radiology Electronically Signed   By: Jacqulynn Cadet M.D.   On: 08/11/2017 11:28   Dg Chest Port 1 View  Result Date: 08/11/2017 CLINICAL DATA:  Post left lower lobe biopsy. EXAM: PORTABLE CHEST 1 VIEW COMPARISON:  07/20/2017. FINDINGS: Airspace disease in the left lower lobe. No pneumothorax following biopsy. Heart is upper limits normal in size. No visible  effusions. IMPRESSION: Left lower lobe airspace opacity.  No pneumothorax following biopsy. Electronically Signed   By: Rolm Baptise M.D.   On: 08/11/2017 11:01    ASSESSMENT AND PLAN: This is a very pleasant 51 years old white male with metastatic non-small cell lung cancer, adenocarcinoma with positive EGFR mutation with deletion in the 19 status post treatment with Gilotrif for 20 months discontinued secondary to disease progression and development of EGFR T790M resistant mutation. The patient is currently on treatment with Tagrisso 80 mg by mouth daily status post 19 months. The patient has been tolerating this treatment fairly well but unfortunately has evidence for disease progression in the left lower lobe that was biopsy-proven to be recurrent non-small cell lung cancer adenocarcinoma. The molecular studies showed development of new resistant EGFR mutation C797S. I had a lengthy discussion with the patient and his wife today about his current disease status and treatment options. The patient understands that he has an incurable condition and all the treatment will be of palliative nature. I discussed with him several options for treatment of his condition including the palliative care approach.  Unfortunately  the patient is not a candidate for treatment with targeted therapy in the presence of the new resistant mutation. I discussed his condition with Dr. Durenda Hurt at [facility] as well as Dr. Rebbeca Paul at [facility] to check the availability of any clinical trial or new targeted therapy for his condition.  Both of them were in agreement with my recommendation regarding starting the patient on systemic chemotherapy in the absence of any targeted therapy. I discussed with the patient 2 options for his systemic chemotherapy including systemic chemotherapy with carboplatin, Alimta and Keytruda versus treatment according to the Impower 150 clinical trial with  carboplatin, paclitaxel, Avastin and Tecentriq (Atezolizumab) which showed some benefit in patient with who failed previous EGFR mutation tyrosine kinase inhibitor. I discussed with the patient the adverse effect of post regimens. The patient is still working and very concerned about the peripheral neuropathy. He would like to proceed with systemic chemotherapy with carboplatin, Alimta and Keytruda. He will be treated with carboplatin for AC of 5, Alimta 500 mg/M2 and Keytruda 200 mg IV every 3 weeks.  I discussed with the patient the adverse effect of this treatment including but not limited to mild alopecia, myelosuppression, nausea and vomiting, liver or renal dysfunction as well as the immunotherapy adverse effect including inflammation of the lung, liver, kidney, thyroid or other endocrine dysfunction as well as a skin rash or diarrhea. He is expected to start the first cycle of this treatment later this week. The patient received vitamin B12 injection today. I will arrange for the patient to have a chemotherapy education class before the first dose of his treatment. I will also arrange for the patient to have repeat CT scan of the chest, abdomen and pelvis as a baseline before starting the first dose of his treatment. I would call his pharmacy with prescription for Compazine 10 mg p.o. every 6 hours as needed for nausea, folic acid 1 mg p.o. daily and Decadron 4 mg p.o. twice daily the day before, day of and day after chemotherapy every 3 weeks. The patient will come back for follow-up visit with the start of cycle #2. He was advised to call immediately if he has any concerning symptoms in the interval. The patient voices understanding of current disease status and treatment options and is in agreement with the current care plan. All questions were answered. The patient knows to call the clinic with any problems, questions or concerns. We can certainly see the patient much sooner if necessary. I  spent 30 minutes counseling the patient face to face. The total time spent in the appointment was 40 minutes.  Disclaimer: This note was dictated with voice recognition software. Similar sounding words can inadvertently be transcribed and may not be corrected upon review.

## 2017-09-05 NOTE — Telephone Encounter (Signed)
Gave avs and calendar for December - February waiting on 12/28

## 2017-09-07 ENCOUNTER — Encounter: Payer: Self-pay | Admitting: *Deleted

## 2017-09-08 ENCOUNTER — Encounter: Payer: Self-pay | Admitting: *Deleted

## 2017-09-08 ENCOUNTER — Encounter (HOSPITAL_COMMUNITY): Payer: Self-pay

## 2017-09-08 ENCOUNTER — Other Ambulatory Visit: Payer: BLUE CROSS/BLUE SHIELD

## 2017-09-08 NOTE — Progress Notes (Signed)
Oncology Nurse Navigator Documentation  Oncology Nurse Navigator Flowsheets 09/08/2017  Navigator Location [facility]  Navigator Encounter Type Other/I updated Dr. Delene Ruffini on Oscar Floyd's appt for treatment. He would like patient to get his first treatment on 09/09/17 or 09/10/17.  I spoke with scheduling and updated them.   Barriers/Navigation Needs Coordination of Care  Interventions Coordination of Care  Coordination of Care Appts  Acuity Level 2  Time Spent with Patient 30

## 2017-09-09 LAB — FUNGUS CULTURE WITH STAIN

## 2017-09-09 LAB — FUNGUS CULTURE RESULT

## 2017-09-09 LAB — FUNGAL ORGANISM REFLEX

## 2017-09-10 ENCOUNTER — Ambulatory Visit (HOSPITAL_BASED_OUTPATIENT_CLINIC_OR_DEPARTMENT_OTHER): Payer: BLUE CROSS/BLUE SHIELD

## 2017-09-10 VITALS — BP 127/85 | HR 89 | Temp 97.6°F | Resp 18

## 2017-09-10 DIAGNOSIS — C7951 Secondary malignant neoplasm of bone: Secondary | ICD-10-CM | POA: Diagnosis not present

## 2017-09-10 DIAGNOSIS — C3411 Malignant neoplasm of upper lobe, right bronchus or lung: Secondary | ICD-10-CM | POA: Diagnosis not present

## 2017-09-10 DIAGNOSIS — C787 Secondary malignant neoplasm of liver and intrahepatic bile duct: Secondary | ICD-10-CM | POA: Diagnosis not present

## 2017-09-10 DIAGNOSIS — Z5112 Encounter for antineoplastic immunotherapy: Secondary | ICD-10-CM | POA: Diagnosis not present

## 2017-09-10 DIAGNOSIS — C7802 Secondary malignant neoplasm of left lung: Secondary | ICD-10-CM | POA: Diagnosis not present

## 2017-09-10 DIAGNOSIS — C7931 Secondary malignant neoplasm of brain: Secondary | ICD-10-CM

## 2017-09-10 DIAGNOSIS — C3491 Malignant neoplasm of unspecified part of right bronchus or lung: Secondary | ICD-10-CM

## 2017-09-10 MED ORDER — PALONOSETRON HCL INJECTION 0.25 MG/5ML
0.2500 mg | Freq: Once | INTRAVENOUS | Status: AC
  Administered 2017-09-10: 0.25 mg via INTRAVENOUS

## 2017-09-10 MED ORDER — SODIUM CHLORIDE 0.9 % IV SOLN
Freq: Once | INTRAVENOUS | Status: AC
  Administered 2017-09-10: 10:00:00 via INTRAVENOUS
  Filled 2017-09-10: qty 5

## 2017-09-10 MED ORDER — PEMBROLIZUMAB CHEMO INJECTION 100 MG/4ML
200.0000 mg | Freq: Once | INTRAVENOUS | Status: AC
  Administered 2017-09-10: 200 mg via INTRAVENOUS
  Filled 2017-09-10: qty 8

## 2017-09-10 MED ORDER — SODIUM CHLORIDE 0.9 % IV SOLN
490.0000 mg/m2 | Freq: Once | INTRAVENOUS | Status: AC
  Administered 2017-09-10: 1100 mg via INTRAVENOUS
  Filled 2017-09-10: qty 40

## 2017-09-10 MED ORDER — PALONOSETRON HCL INJECTION 0.25 MG/5ML
INTRAVENOUS | Status: AC
  Filled 2017-09-10: qty 5

## 2017-09-10 MED ORDER — SODIUM CHLORIDE 0.9 % IV SOLN
Freq: Once | INTRAVENOUS | Status: AC
  Administered 2017-09-10: 09:00:00 via INTRAVENOUS

## 2017-09-10 MED ORDER — SODIUM CHLORIDE 0.9 % IV SOLN
635.5000 mg | Freq: Once | INTRAVENOUS | Status: AC
  Administered 2017-09-10: 640 mg via INTRAVENOUS
  Filled 2017-09-10: qty 64

## 2017-09-10 NOTE — Patient Instructions (Signed)
[facility] Discharge Instructions for Patients Receiving Chemotherapy  Today you received the following chemotherapy agent: Keytruda, Alimta, Carboplatin  To help prevent nausea and vomiting after your treatment, we encourage you to take your nausea medication as prescribed.   If you develop nausea and vomiting that is not controlled by your nausea medication, call the clinic.   BELOW ARE SYMPTOMS THAT SHOULD BE REPORTED IMMEDIATELY:  *FEVER GREATER THAN 100.5 F  *CHILLS WITH OR WITHOUT FEVER  NAUSEA AND VOMITING THAT IS NOT CONTROLLED WITH YOUR NAUSEA MEDICATION  *UNUSUAL SHORTNESS OF BREATH  *UNUSUAL BRUISING OR BLEEDING  TENDERNESS IN MOUTH AND THROAT WITH OR WITHOUT PRESENCE OF ULCERS  *URINARY PROBLEMS  *BOWEL PROBLEMS  UNUSUAL RASH Items with * indicate a potential emergency and should be followed up as soon as possible.  Feel free to call the clinic should you have any questions or concerns. The clinic phone number is [phone].  Please show the [facility] at check-in to the Emergency Department and triage nurse.    Pembrolizumab injection What is this medicine? PEMBROLIZUMAB (pem broe liz ue mab) is a monoclonal antibody. It is used to treat melanoma, head and neck cancer, Hodgkin lymphoma, non-small cell lung cancer, urothelial cancer, stomach cancer, and cancers that have a certain genetic condition. This medicine may be used for other purposes; ask your health care provider or pharmacist if you have questions. COMMON BRAND NAME(S): Keytruda What should I tell my health care provider before I take this medicine? They need to know if you have any of these conditions: -diabetes -immune system problems -inflammatory bowel disease -liver disease -lung or breathing disease -lupus -organ transplant -an unusual or allergic reaction to pembrolizumab, other medicines, foods, dyes, or preservatives -pregnant or trying to get  pregnant -breast-feeding How should I use this medicine? This medicine is for infusion into a vein. It is given by a health care professional in a hospital or clinic setting. A special MedGuide will be given to you before each treatment. Be sure to read this information carefully each time. Talk to your pediatrician regarding the use of this medicine in children. While this drug may be prescribed for selected conditions, precautions do apply. Overdosage: If you think you have taken too much of this medicine contact a poison control center or emergency room at once. NOTE: This medicine is only for you. Do not share this medicine with others. What if I miss a dose? It is important not to miss your dose. Call your doctor or health care professional if you are unable to keep an appointment. What may interact with this medicine? Interactions have not been studied. Give your health care provider a list of all the medicines, herbs, non-prescription drugs, or dietary supplements you use. Also tell them if you smoke, drink alcohol, or use illegal drugs. Some items may interact with your medicine. This list may not describe all possible interactions. Give your health care provider a list of all the medicines, herbs, non-prescription drugs, or dietary supplements you use. Also tell them if you smoke, drink alcohol, or use illegal drugs. Some items may interact with your medicine. What should I watch for while using this medicine? Your condition will be monitored carefully while you are receiving this medicine. You may need blood work done while you are taking this medicine. Do not become pregnant while taking this medicine or for 4 months after stopping it. Women should inform their doctor if they wish to become pregnant  or think they might be pregnant. There is a potential for serious side effects to an unborn child. Talk to your health care professional or pharmacist for more information. Do not breast-feed  an infant while taking this medicine or for 4 months after the last dose. What side effects may I notice from receiving this medicine? Side effects that you should report to your doctor or health care professional as soon as possible: -allergic reactions like skin rash, itching or hives, swelling of the face, lips, or tongue -bloody or black, tarry -breathing problems -changes in vision -chest pain -chills -constipation -cough -dizziness or feeling faint or lightheaded -fast or irregular heartbeat -fever -flushing -hair loss -low blood counts - this medicine may decrease the number of white blood cells, red blood cells and platelets. You may be at increased risk for infections and bleeding. -muscle pain -muscle weakness -persistent headache -signs and symptoms of high blood sugar such as dizziness; dry mouth; dry skin; fruity breath; nausea; stomach pain; increased hunger or thirst; increased urination -signs and symptoms of kidney injury like trouble passing urine or change in the amount of urine -signs and symptoms of liver injury like dark urine, light-colored stools, loss of appetite, nausea, right upper belly pain, yellowing of the eyes or skin -stomach pain -sweating -weight loss Side effects that usually do not require medical attention (report to your doctor or health care professional if they continue or are bothersome): -decreased appetite -diarrhea -tiredness This list may not describe all possible side effects. Call your doctor for medical advice about side effects. You may report side effects to FDA at 1-800-FDA-1088. Where should I keep my medicine? This drug is given in a hospital or clinic and will not be stored at home. NOTE: This sheet is a summary. It may not cover all possible information. If you have questions about this medicine, talk to your doctor, pharmacist, or health care provider.  2018 Elsevier/Gold Standard (2016-06-08 12:29:36)    Pemetrexed  injection What is this medicine? PEMETREXED (PEM e TREX ed) is a chemotherapy drug used to treat lung cancers like non-small cell lung cancer and mesothelioma. It may also be used to treat other cancers. This medicine may be used for other purposes; ask your health care provider or pharmacist if you have questions. COMMON BRAND NAME(S): Alimta What should I tell my health care provider before I take this medicine? They need to know if you have any of these conditions: -infection (especially a virus infection such as chickenpox, cold sores, or herpes) -kidney disease -low blood counts, like low white cell, platelet, or red cell counts -lung or breathing disease, like asthma -radiation therapy -an unusual or allergic reaction to pemetrexed, other medicines, foods, dyes, or preservative -pregnant or trying to get pregnant -breast-feeding How should I use this medicine? This drug is given as an infusion into a vein. It is administered in a hospital or clinic by a specially trained health care professional. Talk to your pediatrician regarding the use of this medicine in children. Special care may be needed. Overdosage: If you think you have taken too much of this medicine contact a poison control center or emergency room at once. NOTE: This medicine is only for you. Do not share this medicine with others. What if I miss a dose? It is important not to miss your dose. Call your doctor or health care professional if you are unable to keep an appointment. What may interact with this medicine? This medicine may interact with  the following medications: -Ibuprofen This list may not describe all possible interactions. Give your health care provider a list of all the medicines, herbs, non-prescription drugs, or dietary supplements you use. Also tell them if you smoke, drink alcohol, or use illegal drugs. Some items may interact with your medicine. What should I watch for while using this medicine? Visit  your doctor for checks on your progress. This drug may make you feel generally unwell. This is not uncommon, as chemotherapy can affect healthy cells as well as cancer cells. Report any side effects. Continue your course of treatment even though you feel ill unless your doctor tells you to stop. In some cases, you may be given additional medicines to help with side effects. Follow all directions for their use. Call your doctor or health care professional for advice if you get a fever, chills or sore throat, or other symptoms of a cold or flu. Do not treat yourself. This drug decreases your body's ability to fight infections. Try to avoid being around people who are sick. This medicine may increase your risk to bruise or bleed. Call your doctor or health care professional if you notice any unusual bleeding. Be careful brushing and flossing your teeth or using a toothpick because you may get an infection or bleed more easily. If you have any dental work done, tell your dentist you are receiving this medicine. Avoid taking products that contain aspirin, acetaminophen, ibuprofen, naproxen, or ketoprofen unless instructed by your doctor. These medicines may hide a fever. Call your doctor or health care professional if you get diarrhea or mouth sores. Do not treat yourself. To protect your kidneys, drink water or other fluids as directed while you are taking this medicine. Do not become pregnant while taking this medicine or for 6 months after stopping it. Women should inform their doctor if they wish to become pregnant or think they might be pregnant. Men should not father a child while taking this medicine and for 3 months after stopping it. This may interfere with the ability to father a child. You should talk to your doctor or health care professional if you are concerned about your fertility. There is a potential for serious side effects to an unborn child. Talk to your health care professional or pharmacist  for more information. Do not breast-feed an infant while taking this medicine or for 1 week after stopping it. What side effects may I notice from receiving this medicine? Side effects that you should report to your doctor or health care professional as soon as possible: -allergic reactions like skin rash, itching or hives, swelling of the face, lips, or tongue -breathing problems -redness, blistering, peeling or loosening of the skin, including inside the mouth -signs and symptoms of bleeding such as bloody or black, tarry stools; red or dark-brown urine; spitting up blood or brown material that looks like coffee grounds; red spots on the skin; unusual bruising or bleeding from the eye, gums, or nose -signs and symptoms of infection like fever or chills; cough; sore throat; pain or trouble passing urine -signs and symptoms of kidney injury like trouble passing urine or change in the amount of urine -signs and symptoms of liver injury like dark yellow or brown urine; general ill feeling or flu-like symptoms; light-colored stools; loss of appetite; nausea; right upper belly pain; unusually weak or tired; yellowing of the eyes or skin Side effects that usually do not require medical attention (report to your doctor or health care professional if  they continue or are bothersome): -constipation -dizziness -mouth sores -nausea, vomiting -pain, tingling, numbness in the hands or feet -unusually weak or tired This list may not describe all possible side effects. Call your doctor for medical advice about side effects. You may report side effects to FDA at 1-800-FDA-1088. Where should I keep my medicine? This drug is given in a hospital or clinic and will not be stored at home. NOTE: This sheet is a summary. It may not cover all possible information. If you have questions about this medicine, talk to your doctor, pharmacist, or health care provider.  2018 Elsevier/Gold Standard (2016-06-29  18:51:46)    Carboplatin injection What is this medicine? CARBOPLATIN (KAR boe pla tin) is a chemotherapy drug. It targets fast dividing cells, like cancer cells, and causes these cells to die. This medicine is used to treat ovarian cancer and many other cancers. This medicine may be used for other purposes; ask your health care provider or pharmacist if you have questions. COMMON BRAND NAME(S): Paraplatin What should I tell my health care provider before I take this medicine? They need to know if you have any of these conditions: -blood disorders -hearing problems -kidney disease -recent or ongoing radiation therapy -an unusual or allergic reaction to carboplatin, cisplatin, other chemotherapy, other medicines, foods, dyes, or preservatives -pregnant or trying to get pregnant -breast-feeding How should I use this medicine? This drug is usually given as an infusion into a vein. It is administered in a hospital or clinic by a specially trained health care professional. Talk to your pediatrician regarding the use of this medicine in children. Special care may be needed. Overdosage: If you think you have taken too much of this medicine contact a poison control center or emergency room at once. NOTE: This medicine is only for you. Do not share this medicine with others. What if I miss a dose? It is important not to miss a dose. Call your doctor or health care professional if you are unable to keep an appointment. What may interact with this medicine? -medicines for seizures -medicines to increase blood counts like filgrastim, pegfilgrastim, sargramostim -some antibiotics like amikacin, gentamicin, neomycin, streptomycin, tobramycin -vaccines Talk to your doctor or health care professional before taking any of these medicines: -acetaminophen -aspirin -ibuprofen -ketoprofen -naproxen This list may not describe all possible interactions. Give your health care provider a list of all the  medicines, herbs, non-prescription drugs, or dietary supplements you use. Also tell them if you smoke, drink alcohol, or use illegal drugs. Some items may interact with your medicine. What should I watch for while using this medicine? Your condition will be monitored carefully while you are receiving this medicine. You will need important blood work done while you are taking this medicine. This drug may make you feel generally unwell. This is not uncommon, as chemotherapy can affect healthy cells as well as cancer cells. Report any side effects. Continue your course of treatment even though you feel ill unless your doctor tells you to stop. In some cases, you may be given additional medicines to help with side effects. Follow all directions for their use. Call your doctor or health care professional for advice if you get a fever, chills or sore throat, or other symptoms of a cold or flu. Do not treat yourself. This drug decreases your body's ability to fight infections. Try to avoid being around people who are sick. This medicine may increase your risk to bruise or bleed. Call your doctor or  health care professional if you notice any unusual bleeding. Be careful brushing and flossing your teeth or using a toothpick because you may get an infection or bleed more easily. If you have any dental work done, tell your dentist you are receiving this medicine. Avoid taking products that contain aspirin, acetaminophen, ibuprofen, naproxen, or ketoprofen unless instructed by your doctor. These medicines may hide a fever. Do not become pregnant while taking this medicine. Women should inform their doctor if they wish to become pregnant or think they might be pregnant. There is a potential for serious side effects to an unborn child. Talk to your health care professional or pharmacist for more information. Do not breast-feed an infant while taking this medicine. What side effects may I notice from receiving this  medicine? Side effects that you should report to your doctor or health care professional as soon as possible: -allergic reactions like skin rash, itching or hives, swelling of the face, lips, or tongue -signs of infection - fever or chills, cough, sore throat, pain or difficulty passing urine -signs of decreased platelets or bleeding - bruising, pinpoint red spots on the skin, black, tarry stools, nosebleeds -signs of decreased red blood cells - unusually weak or tired, fainting spells, lightheadedness -breathing problems -changes in hearing -changes in vision -chest pain -high blood pressure -low blood counts - This drug may decrease the number of white blood cells, red blood cells and platelets. You may be at increased risk for infections and bleeding. -nausea and vomiting -pain, swelling, redness or irritation at the injection site -pain, tingling, numbness in the hands or feet -problems with balance, talking, walking -trouble passing urine or change in the amount of urine Side effects that usually do not require medical attention (report to your doctor or health care professional if they continue or are bothersome): -hair loss -loss of appetite -metallic taste in the mouth or changes in taste This list may not describe all possible side effects. Call your doctor for medical advice about side effects. You may report side effects to FDA at 1-800-FDA-1088. Where should I keep my medicine? This drug is given in a hospital or clinic and will not be stored at home. NOTE: This sheet is a summary. It may not cover all possible information. If you have questions about this medicine, talk to your doctor, pharmacist, or health care provider.  2018 [employer]/Gold Standard (2007-12-05 14:38:05)

## 2017-09-14 ENCOUNTER — Telehealth: Payer: Self-pay | Admitting: Medical Oncology

## 2017-09-14 NOTE — Telephone Encounter (Signed)
States he feels tired. He does not have nausea "I am taking the compazine every 6 hours." . I instructed him to only use compazine as needed if he feels nauseous or queasy and not ATC . He says his Left arm is sore and red where IV was inserted. I told him to use warm compresses 3 times a day and to call for increasing redness at the site. He voiced understanding.

## 2017-09-15 ENCOUNTER — Telehealth: Payer: Self-pay | Admitting: Medical Oncology

## 2017-09-15 NOTE — Telephone Encounter (Addendum)
Reports 1 episode of blood on toilet paper and a small amount in toilet water . "Most of it on toilet paper".Had 3 watery stools in past 2 days. He feels better today since he stopped compazine atc. He is maintaining his hydration.

## 2017-09-15 NOTE — Telephone Encounter (Signed)
Per Julien Nordmann I told pt to monitor for now and to call if he has continues to have BRB on toilet paper or water.

## 2017-09-19 ENCOUNTER — Telehealth: Payer: Self-pay | Admitting: Internal Medicine

## 2017-09-19 NOTE — Telephone Encounter (Signed)
Scheduled appt per 1/4 sch message - unable to reach patient.

## 2017-09-21 ENCOUNTER — Ambulatory Visit (HOSPITAL_COMMUNITY)
Admission: RE | Admit: 2017-09-21 | Discharge: 2017-09-21 | Disposition: A | Discharge status: Home / Self Care | Payer: BLUE CROSS/BLUE SHIELD | Source: Ambulatory Visit | Attending: Internal Medicine | Admitting: Internal Medicine

## 2017-09-21 ENCOUNTER — Encounter (HOSPITAL_COMMUNITY): Payer: Self-pay

## 2017-09-21 DIAGNOSIS — I313 Pericardial effusion (noninflammatory): Secondary | ICD-10-CM | POA: Diagnosis not present

## 2017-09-21 DIAGNOSIS — C7800 Secondary malignant neoplasm of unspecified lung: Secondary | ICD-10-CM | POA: Insufficient documentation

## 2017-09-21 DIAGNOSIS — C3491 Malignant neoplasm of unspecified part of right bronchus or lung: Secondary | ICD-10-CM | POA: Insufficient documentation

## 2017-09-21 DIAGNOSIS — J90 Pleural effusion, not elsewhere classified: Secondary | ICD-10-CM | POA: Diagnosis not present

## 2017-09-21 DIAGNOSIS — K449 Diaphragmatic hernia without obstruction or gangrene: Secondary | ICD-10-CM | POA: Insufficient documentation

## 2017-09-21 MED ORDER — IOPAMIDOL (ISOVUE-300) INJECTION 61%
30.0000 mL | Freq: Once | INTRAVENOUS | Status: DC | PRN
  Administered 2017-09-21: 30 mL via ORAL
  Filled 2017-09-21: qty 30

## 2017-09-21 MED ORDER — IOPAMIDOL (ISOVUE-300) INJECTION 61%
INTRAVENOUS | Status: AC
  Filled 2017-09-21: qty 30

## 2017-09-21 MED ORDER — IOPAMIDOL (ISOVUE-300) INJECTION 61%
INTRAVENOUS | Status: AC
  Filled 2017-09-21: qty 100

## 2017-09-21 MED ORDER — IOPAMIDOL (ISOVUE-300) INJECTION 61%
100.0000 mL | Freq: Once | INTRAVENOUS | Status: AC | PRN
  Administered 2017-09-21: 100 mL via INTRAVENOUS

## 2017-09-23 ENCOUNTER — Other Ambulatory Visit: Payer: Self-pay | Admitting: Radiation Therapy

## 2017-09-23 DIAGNOSIS — C7949 Secondary malignant neoplasm of other parts of nervous system: Principal | ICD-10-CM

## 2017-09-23 DIAGNOSIS — C7931 Secondary malignant neoplasm of brain: Secondary | ICD-10-CM

## 2017-09-24 LAB — ACID FAST CULTURE WITH REFLEXED SENSITIVITIES

## 2017-09-24 LAB — ACID FAST CULTURE WITH REFLEXED SENSITIVITIES (MYCOBACTERIA): Acid Fast Culture: NEGATIVE

## 2017-09-28 ENCOUNTER — Other Ambulatory Visit: Payer: Self-pay | Admitting: Medical Oncology

## 2017-09-28 DIAGNOSIS — C3491 Malignant neoplasm of unspecified part of right bronchus or lung: Secondary | ICD-10-CM

## 2017-09-29 ENCOUNTER — Inpatient Hospital Stay (HOSPITAL_BASED_OUTPATIENT_CLINIC_OR_DEPARTMENT_OTHER): Payer: BLUE CROSS/BLUE SHIELD | Admitting: Internal Medicine

## 2017-09-29 ENCOUNTER — Encounter: Payer: Self-pay | Admitting: Internal Medicine

## 2017-09-29 ENCOUNTER — Inpatient Hospital Stay: Payer: BLUE CROSS/BLUE SHIELD

## 2017-09-29 ENCOUNTER — Telehealth: Payer: Self-pay | Admitting: Internal Medicine

## 2017-09-29 ENCOUNTER — Inpatient Hospital Stay: Payer: BLUE CROSS/BLUE SHIELD | Attending: Internal Medicine

## 2017-09-29 VITALS — BP 134/81 | HR 86 | Temp 98.3°F | Resp 18 | Ht 74.0 in | Wt 214.9 lb

## 2017-09-29 DIAGNOSIS — C7951 Secondary malignant neoplasm of bone: Secondary | ICD-10-CM | POA: Diagnosis not present

## 2017-09-29 DIAGNOSIS — K219 Gastro-esophageal reflux disease without esophagitis: Secondary | ICD-10-CM | POA: Insufficient documentation

## 2017-09-29 DIAGNOSIS — Z5112 Encounter for antineoplastic immunotherapy: Secondary | ICD-10-CM | POA: Insufficient documentation

## 2017-09-29 DIAGNOSIS — I82890 Acute embolism and thrombosis of other specified veins: Secondary | ICD-10-CM | POA: Insufficient documentation

## 2017-09-29 DIAGNOSIS — D701 Agranulocytosis secondary to cancer chemotherapy: Secondary | ICD-10-CM | POA: Insufficient documentation

## 2017-09-29 DIAGNOSIS — C3400 Malignant neoplasm of unspecified main bronchus: Secondary | ICD-10-CM

## 2017-09-29 DIAGNOSIS — R0602 Shortness of breath: Secondary | ICD-10-CM | POA: Diagnosis not present

## 2017-09-29 DIAGNOSIS — C7931 Secondary malignant neoplasm of brain: Secondary | ICD-10-CM | POA: Diagnosis not present

## 2017-09-29 DIAGNOSIS — I808 Phlebitis and thrombophlebitis of other sites: Secondary | ICD-10-CM | POA: Insufficient documentation

## 2017-09-29 DIAGNOSIS — C7949 Secondary malignant neoplasm of other parts of nervous system: Secondary | ICD-10-CM

## 2017-09-29 DIAGNOSIS — Z85118 Personal history of other malignant neoplasm of bronchus and lung: Secondary | ICD-10-CM | POA: Insufficient documentation

## 2017-09-29 DIAGNOSIS — C3432 Malignant neoplasm of lower lobe, left bronchus or lung: Secondary | ICD-10-CM

## 2017-09-29 DIAGNOSIS — Z5111 Encounter for antineoplastic chemotherapy: Secondary | ICD-10-CM

## 2017-09-29 DIAGNOSIS — C3491 Malignant neoplasm of unspecified part of right bronchus or lung: Secondary | ICD-10-CM

## 2017-09-29 LAB — CBC WITH DIFFERENTIAL (CANCER CENTER ONLY)
BASOS ABS: 0 10*3/uL (ref 0.0–0.1)
BASOS PCT: 0 %
EOS ABS: 0.1 10*3/uL (ref 0.0–0.5)
EOS PCT: 3 %
HCT: 35.8 % — ABNORMAL LOW (ref 38.4–49.9)
HEMOGLOBIN: 11.9 g/dL — AB (ref 13.0–17.1)
Lymphocytes Relative: 27 %
Lymphs Abs: 0.7 10*3/uL — ABNORMAL LOW (ref 0.9–3.3)
MCH: 29.3 pg (ref 27.2–33.4)
MCHC: 33.2 g/dL (ref 32.0–36.0)
MCV: 88.2 fL (ref 79.3–98.0)
Monocytes Absolute: 0.5 10*3/uL (ref 0.1–0.9)
Monocytes Relative: 19 %
NEUTROS PCT: 51 %
Neutro Abs: 1.3 10*3/uL — ABNORMAL LOW (ref 1.5–6.5)
PLATELETS: 238 10*3/uL (ref 140–400)
RBC: 4.06 MIL/uL — AB (ref 4.20–5.82)
RDW: 14.1 % (ref 11.0–15.6)
WBC: 2.5 10*3/uL — AB (ref 4.0–10.3)

## 2017-09-29 LAB — CMP (CANCER CENTER ONLY)
ALBUMIN: 3.1 g/dL — AB (ref 3.5–5.0)
ALK PHOS: 428 U/L — AB (ref 40–150)
ALT: 36 U/L (ref 0–55)
AST: 41 U/L — AB (ref 5–34)
Anion gap: 8 (ref 3–11)
BUN: 13 mg/dL (ref 7–26)
CALCIUM: 8.8 mg/dL (ref 8.4–10.4)
CHLORIDE: 111 mmol/L — AB (ref 98–109)
CO2: 21 mmol/L — AB (ref 22–29)
CREATININE: 1.12 mg/dL (ref 0.70–1.30)
GFR, Est AFR Am: >60 mL/min (ref >60)
GFR, Estimated: >60 mL/min (ref >60)
GLUCOSE: 90 mg/dL (ref 70–140)
Potassium: 3.9 mmol/L (ref 3.5–5.1)
SODIUM: 140 mmol/L (ref 136–145)
Total Bilirubin: 0.3 mg/dL (ref 0.2–1.2)
Total Protein: 6.5 g/dL (ref 6.4–8.3)

## 2017-09-29 LAB — TSH: TSH: 0.887 u[IU]/mL (ref 0.320–4.118)

## 2017-09-29 MED ORDER — DENOSUMAB 120 MG/1.7ML ~~LOC~~ SOLN
120.0000 mg | Freq: Once | SUBCUTANEOUS | Status: AC
  Administered 2017-09-29: 120 mg via SUBCUTANEOUS

## 2017-09-29 MED ORDER — PALONOSETRON HCL INJECTION 0.25 MG/5ML
INTRAVENOUS | Status: AC
Start: 2017-09-29 — End: 2017-09-29
  Filled 2017-09-29: qty 5

## 2017-09-29 MED ORDER — SODIUM CHLORIDE 0.9 % IV SOLN
640.0000 mg | Freq: Once | INTRAVENOUS | Status: AC
  Administered 2017-09-29: 640 mg via INTRAVENOUS
  Filled 2017-09-29: qty 64

## 2017-09-29 MED ORDER — DENOSUMAB 120 MG/1.7ML ~~LOC~~ SOLN
SUBCUTANEOUS | Status: AC
  Filled 2017-09-29: qty 1.7

## 2017-09-29 MED ORDER — FOSAPREPITANT DIMEGLUMINE INJECTION 150 MG
Freq: Once | INTRAVENOUS | Status: AC
  Administered 2017-09-29: 15:00:00 via INTRAVENOUS
  Filled 2017-09-29: qty 5

## 2017-09-29 MED ORDER — PALONOSETRON HCL INJECTION 0.25 MG/5ML
0.2500 mg | Freq: Once | INTRAVENOUS | Status: AC
  Administered 2017-09-29: 0.25 mg via INTRAVENOUS

## 2017-09-29 MED ORDER — SODIUM CHLORIDE 0.9 % IV SOLN
490.0000 mg/m2 | Freq: Once | INTRAVENOUS | Status: AC
  Administered 2017-09-29: 1100 mg via INTRAVENOUS
  Filled 2017-09-29: qty 40

## 2017-09-29 MED ORDER — SODIUM CHLORIDE 0.9 % IV SOLN
200.0000 mg | Freq: Once | INTRAVENOUS | Status: AC
  Administered 2017-09-29: 200 mg via INTRAVENOUS
  Filled 2017-09-29: qty 8

## 2017-09-29 MED ORDER — SODIUM CHLORIDE 0.9 % IV SOLN
Freq: Once | INTRAVENOUS | Status: AC
  Administered 2017-09-29: 14:00:00 via INTRAVENOUS

## 2017-09-29 NOTE — Telephone Encounter (Signed)
Scheduled appt per 1/17 los - pt aware of appts - my chart active.

## 2017-09-29 NOTE — Progress Notes (Signed)
[facility] [phone]   [phone]  OFFICE PROGRESS NOTE  [name], MD [address] [place] [address]  DIAGNOSIS: stage IV (T2a, N0, M1b) non-small cell lung cancer consistent with poorly differentiated adenocarcinoma with positive EGFR mutation with deletion in exon 19, diagnosed in July of 2015 and presented with right upper lobe lung mass in addition to extensive liver, brain and bone metastases.  He had disease progression and development of EGFR T790M resistant mutation in April 2017. 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  PRIOR THERAPY: 1) Status post whole brain irradiation under the care Dr. Tammi Klippel completed 04/12/2014. 2) Gilotrif 40 mg po daily - therapy beginning 04/03/2014. Status post approximately 20 months of therapy discontinued secondary to disease progression and development of EGFR T790M resistant mutation.  3) palliative radiotherapy to the lytic lesion in the right clavicle. 4) Tagrisso 80 mg by mouth daily status post 19 months of treatment. First dose was given on 01/12/2016.  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  CURRENT THERAPY: 1) systemic chemotherapy with carboplatin for AC of 5, Alimta 500 mg/M2 and Keytruda 200 mg IV every 3 weeks.  First dose September 09, 2017.  Status post 1 cycle. 2) Xgeva 120 g subcutaneously on monthly basis.  INTERVAL HISTORY: Oscar Floyd 52 y.o. male returns to the clinic today for follow-up visit.  The patient is feeling fine today with no specific complaints except for the persistent shortness of breath that has been getting worse over the last few weeks.  He tolerated the first cycle of his systemic chemotherapy with carboplatin, Alimta and Keytruda fairly well.  He had 1 or 2 days of mild nausea.  He denied having any significant weight loss or night  sweats.  He has no fever or chills.  The patient denied having any headache or visual changes.  He had repeat CT scan of the chest, abdomen and pelvis last week as a baseline for his chemotherapy.  He is here today for evaluation before starting cycle #2.  MEDICAL HISTORY: Past Medical History:  Diagnosis Date  . Bone metastases ([place]) 08/26/2015  . Encounter for antineoplastic chemotherapy 01/06/2016  . Family history of cancer   . Hypertension   . Lung cancer ([place])    RUL lung with mets to liver, bone and brain    ALLERGIES:  has No Known Allergies.  MEDICATIONS:  Current Outpatient Medications  Medication Sig Dispense Refill  . albuterol (PROVENTIL HFA;VENTOLIN HFA) 108 (90 Base) MCG/ACT inhaler Inhale 2 puffs into the lungs every 6 (six) hours as needed for wheezing or shortness of breath. 1 Inhaler 6  . ALPRAZolam (XANAX) 1 MG tablet Take 0.5-1 mg by mouth.    . dexamethasone (DECADRON) 4 MG tablet 4 mg p.o. twice daily the day before, day of and day after chemotherapy every 3 weeks 40 tablet 1  . diazepam (VALIUM) 10 MG tablet Take 10 mg by mouth.    . docusate sodium (COLACE) 100 MG capsule Take 100 mg by mouth daily.    Marland Kitchen esomeprazole (NEXIUM) 20 MG capsule Take 20 mg daily by mouth.     . fexofenadine (ALLEGRA) 180 MG tablet Take 180 mg by mouth.    . Fluticasone Propionate, Inhal, 100 MCG/BLIST AEPB Inhale 2 puffs into the lungs 2 (two) times daily. 60 each 5  . folic acid (FOLVITE) 1  MG tablet Take 1 tablet (1 mg total) by mouth daily. 30 tablet 4  . Hydrocodone-Homatropine 5-1.5 MG TABS Take 1 tablet by mouth 4 (four) times daily as needed. 60 each 0  . ibuprofen (ADVIL,MOTRIN) 800 MG tablet Take by mouth.    . mirtazapine (REMERON) 30 MG tablet Take 1 tablet (30 mg total) by mouth at bedtime. (Patient taking differently: Take 30 mg at bedtime as needed by mouth. ) 30 tablet 2  . naproxen sodium (ANAPROX) 220 MG tablet Take 220 mg 2 (two) times daily as needed by mouth.     Marland Kitchen  osimertinib mesylate (TAGRISSO) 80 MG tablet Take 1 tablet (80 mg total) by mouth daily. 30 tablet 1  . predniSONE (STERAPRED UNI-PAK 48 TAB) 10 MG (48) TBPK tablet 6 tabs x 2 days, 5 tabs x 2 days, 4 tabs x 2 days, 3 tabs x 2 days, 2 tabs x 2 days, 1 tab x 2 days 42 tablet 0  . prochlorperazine (COMPAZINE) 10 MG tablet Take 1 tablet (10 mg total) by mouth every 6 (six) hours as needed for nausea or vomiting. 30 tablet 0  . pseudoephedrine-guaifenesin (MUCINEX D) 60-600 MG 12 hr tablet Take by mouth.     No current facility-administered medications for this visit.     SURGICAL HISTORY:  Past Surgical History:  Procedure Laterality Date  . VASECTOMY    . VIDEO BRONCHOSCOPY Bilateral 03/20/2014   Procedure: VIDEO BRONCHOSCOPY WITH FLUORO;  Surgeon: Rigoberto Noel, MD;  Location: [facility];  Service: Cardiopulmonary;  Laterality: Bilateral;  . VIDEO BRONCHOSCOPY Bilateral 07/20/2017   Procedure: VIDEO BRONCHOSCOPY WITH FLUORO;  Surgeon: Rigoberto Noel, MD;  Location: [facility];  Service: Cardiopulmonary;  Laterality: Bilateral;    REVIEW OF SYSTEMS:  Constitutional: positive for fatigue Eyes: negative Ears, nose, mouth, throat, and face: negative Respiratory: positive for cough and dyspnea on exertion Cardiovascular: negative Gastrointestinal: negative Genitourinary:negative Integument/breast: negative Hematologic/lymphatic: negative Musculoskeletal:negative Neurological: negative Behavioral/Psych: negative Endocrine: negative Allergic/Immunologic: negative   PHYSICAL EXAMINATION: General appearance: alert, cooperative, appears stated age, fatigued and no distress Head: Normocephalic, without obvious abnormality, atraumatic Neck: no adenopathy, no JVD, supple, symmetrical, trachea midline and thyroid not enlarged, symmetric, no tenderness/mass/nodules Lymph nodes: Cervical, supraclavicular, and axillary nodes normal. Resp: clear to auscultation bilaterally Back: symmetric, no  curvature. ROM normal. No CVA tenderness. Cardio: regular rate and rhythm, S1, S2 normal, no murmur, click, rub or gallop GI: soft, non-tender; bowel sounds normal; no masses,  no organomegaly Extremities: extremities normal, atraumatic, no cyanosis or edema Neurologic: Alert and oriented X 3, normal strength and tone. Normal symmetric reflexes. Normal coordination and gait  ECOG PERFORMANCE STATUS: 1 - Symptomatic but completely ambulatory  Blood pressure 134/81, pulse 86, temperature 98.3 F (36.8 C), temperature source Oral, resp. rate 18, height 6' 2"  (1.88 m), weight 214 lb 14.4 oz (97.5 kg), SpO2 99 %.  LABORATORY DATA: Lab Results  Component Value Date   WBC 2.5 (L) 09/29/2017   HGB 13.8 08/26/2017   HCT 35.8 (L) 09/29/2017   MCV 88.2 09/29/2017   PLT 238 09/29/2017      Chemistry      Component Value Date/Time   NA 140 09/29/2017 1114   NA 141 08/31/2017 0841   K 3.9 09/29/2017 1114   K 4.4 08/31/2017 0841   CL 111 (H) 09/29/2017 1114   CO2 21 (L) 09/29/2017 1114   CO2 26 08/31/2017 0841   BUN 13 09/29/2017 1114   BUN 14.7 08/31/2017 0841  CREATININE 1.2 08/31/2017 0841      Component Value Date/Time   CALCIUM 8.8 09/29/2017 1114   CALCIUM 8.8 08/31/2017 0841   ALKPHOS 428 (H) 09/29/2017 1114   ALKPHOS 350 (H) 08/31/2017 0841   AST 41 (H) 09/29/2017 1114   AST 31 08/31/2017 0841   ALT 36 09/29/2017 1114   ALT 17 08/31/2017 0841   BILITOT 0.3 09/29/2017 1114   BILITOT 0.50 08/31/2017 0841       RADIOGRAPHIC STUDIES: Ct Chest W Contrast  Result Date: 09/21/2017 CLINICAL DATA:  Stage IV poorly differentiated right upper lobe lung adenocarcinoma diagnosed 2015 with disease progression in April 2017 and progressive biopsy-proven left lower lobe lung malignancy and November 2018. Interval chemotherapy. Restaging. EXAM: CT CHEST, ABDOMEN, AND PELVIS WITH CONTRAST TECHNIQUE: Multidetector CT imaging of the chest, abdomen and pelvis was performed following the  standard protocol during bolus administration of intravenous contrast. CONTRAST:  168m ISOVUE-300 IOPAMIDOL (ISOVUE-300) INJECTION 61% COMPARISON:  08/02/2017 PET-CT. 06/14/2017 CT chest, abdomen and pelvis. FINDINGS: CT CHEST FINDINGS Cardiovascular: Normal heart size. Stable small pericardial effusion/thickening. Great vessels are normal in course and caliber. No central pulmonary emboli. Mediastinum/Nodes: No discrete thyroid nodules. Unremarkable esophagus. No pathologically enlarged axillary, mediastinal or hilar lymph nodes. Lungs/Pleura: No pneumothorax. Small dependent left pleural effusion is minimally increased. No right pleural effusion. Poorly marginated 10.3 x 4.9 cm left lower lobe lung mass (series 6/image 91), previously 9.9 x 4.3 cm on 08/02/2017 using similar measurement technique, increased. Patchy confluent poorly marginated subsolid micronodularity throughout the right lower lobe, right middle lobe and posterior upper lobes bilaterally is increased. Irregular 1.9 cm central right upper lobe pulmonary nodule (series 6/image 69), not appreciably changed. Diffuse interlobular septal thickening is mildly increased. Musculoskeletal: Widespread patchy confluent sclerotic osseous metastatic disease throughout the thoracic skeleton has not appreciably changed. CT ABDOMEN PELVIS FINDINGS Hepatobiliary: Normal liver size. No discrete liver lesions. Normal gallbladder with no radiopaque cholelithiasis. No biliary ductal dilatation. Pancreas: No discrete pancreatic mass. Stable contour irregularity of the superior pancreatic body. No pancreatic duct dilation. Spleen: Normal size. No mass. Adrenals/Urinary Tract: Normal adrenals. Stable subcentimeter hypodense renal cortical lesion in the upper right kidney, considered benign. No new renal lesions. No hydronephrosis. Normal bladder. Stomach/Bowel: Small hiatal hernia. Otherwise normal nondistended stomach. Normal caliber small bowel with no small bowel wall  thickening. Normal appendix. Normal large bowel with no diverticulosis, large bowel wall thickening or pericolonic fat stranding. Vascular/Lymphatic: Normal caliber abdominal aorta. Patent portal, splenic, hepatic and renal veins. No pathologically enlarged lymph nodes in the abdomen or pelvis. Reproductive: Normal size prostate. Other: No pneumoperitoneum, ascites or focal fluid collection. Musculoskeletal: No appreciable interval change in widespread patchy confluent sclerotic osseous metastatic disease throughout the lumbar spine, sacrum and bilateral pelvic girdle. Mild lumbar spondylosis. IMPRESSION: 1. Interval progression of widespread pulmonary metastatic disease. Interval growth of the dominant large poorly marginated left lower lobe lung mass. Increased diffuse interlobular septal thickening and poorly marginated subsolid pulmonary micronodularity in both lungs, most prominent in the right lower lobe and posterior upper lobes. 2. Minimally increased small dependent left pleural effusion. Stable small pericardial effusion/thickening. 3. No change in widespread patchy confluent sclerotic osseous metastatic disease throughout the axial and proximal appendicular skeleton. 4. Small hiatal hernia. Electronically Signed   By: JIlona SorrelM.D.   On: 09/21/2017 13:20   Ct Abdomen Pelvis W Contrast  Result Date: 09/21/2017 CLINICAL DATA:  Stage IV poorly differentiated right upper lobe lung adenocarcinoma diagnosed 2015 with disease progression  in April 2017 and progressive biopsy-proven left lower lobe lung malignancy and November 2018. Interval chemotherapy. Restaging. EXAM: CT CHEST, ABDOMEN, AND PELVIS WITH CONTRAST TECHNIQUE: Multidetector CT imaging of the chest, abdomen and pelvis was performed following the standard protocol during bolus administration of intravenous contrast. CONTRAST:  115m ISOVUE-300 IOPAMIDOL (ISOVUE-300) INJECTION 61% COMPARISON:  08/02/2017 PET-CT. 06/14/2017 CT chest, abdomen and  pelvis. FINDINGS: CT CHEST FINDINGS Cardiovascular: Normal heart size. Stable small pericardial effusion/thickening. Great vessels are normal in course and caliber. No central pulmonary emboli. Mediastinum/Nodes: No discrete thyroid nodules. Unremarkable esophagus. No pathologically enlarged axillary, mediastinal or hilar lymph nodes. Lungs/Pleura: No pneumothorax. Small dependent left pleural effusion is minimally increased. No right pleural effusion. Poorly marginated 10.3 x 4.9 cm left lower lobe lung mass (series 6/image 91), previously 9.9 x 4.3 cm on 08/02/2017 using similar measurement technique, increased. Patchy confluent poorly marginated subsolid micronodularity throughout the right lower lobe, right middle lobe and posterior upper lobes bilaterally is increased. Irregular 1.9 cm central right upper lobe pulmonary nodule (series 6/image 69), not appreciably changed. Diffuse interlobular septal thickening is mildly increased. Musculoskeletal: Widespread patchy confluent sclerotic osseous metastatic disease throughout the thoracic skeleton has not appreciably changed. CT ABDOMEN PELVIS FINDINGS Hepatobiliary: Normal liver size. No discrete liver lesions. Normal gallbladder with no radiopaque cholelithiasis. No biliary ductal dilatation. Pancreas: No discrete pancreatic mass. Stable contour irregularity of the superior pancreatic body. No pancreatic duct dilation. Spleen: Normal size. No mass. Adrenals/Urinary Tract: Normal adrenals. Stable subcentimeter hypodense renal cortical lesion in the upper right kidney, considered benign. No new renal lesions. No hydronephrosis. Normal bladder. Stomach/Bowel: Small hiatal hernia. Otherwise normal nondistended stomach. Normal caliber small bowel with no small bowel wall thickening. Normal appendix. Normal large bowel with no diverticulosis, large bowel wall thickening or pericolonic fat stranding. Vascular/Lymphatic: Normal caliber abdominal aorta. Patent portal,  splenic, hepatic and renal veins. No pathologically enlarged lymph nodes in the abdomen or pelvis. Reproductive: Normal size prostate. Other: No pneumoperitoneum, ascites or focal fluid collection. Musculoskeletal: No appreciable interval change in widespread patchy confluent sclerotic osseous metastatic disease throughout the lumbar spine, sacrum and bilateral pelvic girdle. Mild lumbar spondylosis. IMPRESSION: 1. Interval progression of widespread pulmonary metastatic disease. Interval growth of the dominant large poorly marginated left lower lobe lung mass. Increased diffuse interlobular septal thickening and poorly marginated subsolid pulmonary micronodularity in both lungs, most prominent in the right lower lobe and posterior upper lobes. 2. Minimally increased small dependent left pleural effusion. Stable small pericardial effusion/thickening. 3. No change in widespread patchy confluent sclerotic osseous metastatic disease throughout the axial and proximal appendicular skeleton. 4. Small hiatal hernia. Electronically Signed   By: JIlona SorrelM.D.   On: 09/21/2017 13:20    ASSESSMENT AND PLAN: This is a very pleasant 52years old white male with metastatic non-small cell lung cancer, adenocarcinoma with positive EGFR mutation with deletion in the 19 status post treatment with Gilotrif for 20 months discontinued secondary to disease progression and development of EGFR T790M resistant mutation. The patient is currently on treatment with Tagrisso 80 mg by mouth daily status post 19 months. The patient has been tolerating this treatment fairly well but unfortunately has evidence for disease progression in the left lower lobe that was biopsy-proven to be recurrent non-small cell lung cancer adenocarcinoma. The molecular studies showed development of new resistant EGFR mutation C797S. The patient is currently on systemic chemotherapy with carboplatin, Alimta and Keytruda status post 1 cycle.  He tolerated  the first cycle of  this treatment fairly well. Repeat CT scan of the chest, abdomen and pelvis performed recently showed further increase and progression of his disease.  But this will be used as a baseline for his systemic chemotherapy. I personally and independently reviewed the scans and discussed the results with the patient today. I recommended for him to proceed with cycle #2 today as a scheduled.  His absolute neutrophil count is a slightly low today, we will continue to monitor his blood count on weekly basis and consider the patient for treatment with growth factor if needed. He will come back for follow-up visit in 3 weeks for evaluation before the next cycle of his treatment. The patient was advised to call immediately if he has any concerning symptoms in the interval. The patient voices understanding of current disease status and treatment options and is in agreement with the current care plan. All questions were answered. The patient knows to call the clinic with any problems, questions or concerns. We can certainly see the patient much sooner if necessary.  Disclaimer: This note was dictated with voice recognition software. Similar sounding words can inadvertently be transcribed and may not be corrected upon review.

## 2017-09-29 NOTE — Progress Notes (Signed)
Per Dr Julien Nordmann , It is okay to treat pt today with carbplatin,alimta , and keytruda with low WBC.

## 2017-09-29 NOTE — Patient Instructions (Addendum)
[facility] Discharge Instructions for Patients Receiving Chemotherapy  Today you received the following chemotherapy agents: Pembrolizumab (Keytruda), Pemetrexed (Alimta), and Carboplatin (Paraplatin).  To help prevent nausea and vomiting after your treatment, we encourage you to take your nausea medication as directed   If you develop nausea and vomiting that is not controlled by your nausea medication, call the clinic.   BELOW ARE SYMPTOMS THAT SHOULD BE REPORTED IMMEDIATELY:  *FEVER GREATER THAN 100.5 F  *CHILLS WITH OR WITHOUT FEVER  NAUSEA AND VOMITING THAT IS NOT CONTROLLED WITH YOUR NAUSEA MEDICATION  *UNUSUAL SHORTNESS OF BREATH  *UNUSUAL BRUISING OR BLEEDING  TENDERNESS IN MOUTH AND THROAT WITH OR WITHOUT PRESENCE OF ULCERS  *URINARY PROBLEMS  *BOWEL PROBLEMS  UNUSUAL RASH Items with * indicate a potential emergency and should be followed up as soon as possible.  Feel free to call the clinic should you have any questions or concerns. The clinic phone number is [phone].  Please show the [facility] at check-in to the Emergency Department and triage nurse.  Denosumab injection What is this medicine? DENOSUMAB (den oh sue mab) slows bone breakdown. Prolia is used to treat osteoporosis in women after menopause and in men. Delton See is used to treat a high calcium level due to cancer and to prevent bone fractures and other bone problems caused by multiple myeloma or cancer bone metastases. Delton See is also used to treat giant cell tumor of the bone. This medicine may be used for other purposes; ask your health care provider or pharmacist if you have questions. COMMON BRAND NAME(S): Prolia, XGEVA What should I tell my health care provider before I take this medicine? They need to know if you have any of these conditions: -dental disease -having surgery or tooth extraction -infection -kidney disease -low levels of calcium or Vitamin D in the  blood -malnutrition -on hemodialysis -skin conditions or sensitivity -thyroid or parathyroid disease -an unusual reaction to denosumab, other medicines, foods, dyes, or preservatives -pregnant or trying to get pregnant -breast-feeding How should I use this medicine? This medicine is for injection under the skin. It is given by a health care professional in a hospital or clinic setting. If you are getting Prolia, a special MedGuide will be given to you by the pharmacist with each prescription and refill. Be sure to read this information carefully each time. For Prolia, talk to your pediatrician regarding the use of this medicine in children. Special care may be needed. For Delton See, talk to your pediatrician regarding the use of this medicine in children. While this drug may be prescribed for children as young as 13 years for selected conditions, precautions do apply. Overdosage: If you think you have taken too much of this medicine contact a poison control center or emergency room at once. NOTE: This medicine is only for you. Do not share this medicine with others. What if I miss a dose? It is important not to miss your dose. Call your doctor or health care professional if you are unable to keep an appointment. What may interact with this medicine? Do not take this medicine with any of the following medications: -other medicines containing denosumab This medicine may also interact with the following medications: -medicines that lower your chance of fighting infection -steroid medicines like prednisone or cortisone This list may not describe all possible interactions. Give your health care provider a list of all the medicines, herbs, non-prescription drugs, or dietary supplements you use. Also tell them if you  smoke, drink alcohol, or use illegal drugs. Some items may interact with your medicine. What should I watch for while using this medicine? Visit your doctor or health care professional for  regular checks on your progress. Your doctor or health care professional may order blood tests and other tests to see how you are doing. Call your doctor or health care professional for advice if you get a fever, chills or sore throat, or other symptoms of a cold or flu. Do not treat yourself. This drug may decrease your body's ability to fight infection. Try to avoid being around people who are sick. You should make sure you get enough calcium and vitamin D while you are taking this medicine, unless your doctor tells you not to. Discuss the foods you eat and the vitamins you take with your health care professional. See your dentist regularly. Brush and floss your teeth as directed. Before you have any dental work done, tell your dentist you are receiving this medicine. Do not become pregnant while taking this medicine or for 5 months after stopping it. Talk with your doctor or health care professional about your birth control options while taking this medicine. Women should inform their doctor if they wish to become pregnant or think they might be pregnant. There is a potential for serious side effects to an unborn child. Talk to your health care professional or pharmacist for more information. What side effects may I notice from receiving this medicine? Side effects that you should report to your doctor or health care professional as soon as possible: -allergic reactions like skin rash, itching or hives, swelling of the face, lips, or tongue -bone pain -breathing problems -dizziness -jaw pain, especially after dental work -redness, blistering, peeling of the skin -signs and symptoms of infection like fever or chills; cough; sore throat; pain or trouble passing urine -signs of low calcium like fast heartbeat, muscle cramps or muscle pain; pain, tingling, numbness in the hands or feet; seizures -unusual bleeding or bruising -unusually weak or tired Side effects that usually do not require medical  attention (report to your doctor or health care professional if they continue or are bothersome): -constipation -diarrhea -headache -joint pain -loss of appetite -muscle pain -runny nose -tiredness -upset stomach This list may not describe all possible side effects. Call your doctor for medical advice about side effects. You may report side effects to FDA at 1-800-FDA-1088. Where should I keep my medicine? This medicine is only given in a clinic, doctor's office, or other health care setting and will not be stored at home. NOTE: This sheet is a summary. It may not cover all possible information. If you have questions about this medicine, talk to your doctor, pharmacist, or health care provider.  2018 Elsevier/Gold Standard (2016-09-21 19:17:21)

## 2017-10-05 ENCOUNTER — Telehealth: Payer: Self-pay | Admitting: *Deleted

## 2017-10-05 ENCOUNTER — Other Ambulatory Visit: Payer: Self-pay | Admitting: Internal Medicine

## 2017-10-05 ENCOUNTER — Other Ambulatory Visit: Payer: Self-pay | Admitting: Medical Oncology

## 2017-10-05 DIAGNOSIS — R0602 Shortness of breath: Secondary | ICD-10-CM

## 2017-10-05 DIAGNOSIS — R05 Cough: Secondary | ICD-10-CM

## 2017-10-05 DIAGNOSIS — C3491 Malignant neoplasm of unspecified part of right bronchus or lung: Secondary | ICD-10-CM

## 2017-10-05 DIAGNOSIS — R059 Cough, unspecified: Secondary | ICD-10-CM

## 2017-10-05 NOTE — Telephone Encounter (Signed)
Oncology Nurse Navigator Documentation  Oncology Nurse Navigator Flowsheets 10/05/2017  Navigator Location [facility]  Navigator Encounter Type Telephone/I received a call from Mr. Hall Busing.  He needed clarification on medications.  I spoke with Dr. Julien Nordmann and updated patient on what he needs to take. He also needs refill of inhaler. I updated him that he has 5 refills and to call pharmacy.  He was thankful for the help.   Telephone Outgoing Call;Incoming Call  Treatment Phase Treatment  Barriers/Navigation Needs Education  Education Other  Interventions Education  Education Method Verbal  Acuity Level 2  Acuity Level 2 Educational needs  Time Spent with Patient 30

## 2017-10-06 ENCOUNTER — Inpatient Hospital Stay: Payer: BLUE CROSS/BLUE SHIELD

## 2017-10-06 ENCOUNTER — Other Ambulatory Visit: Payer: Self-pay | Admitting: Internal Medicine

## 2017-10-06 ENCOUNTER — Encounter (HOSPITAL_COMMUNITY): Payer: Self-pay

## 2017-10-06 DIAGNOSIS — C3432 Malignant neoplasm of lower lobe, left bronchus or lung: Secondary | ICD-10-CM | POA: Diagnosis not present

## 2017-10-06 DIAGNOSIS — C3491 Malignant neoplasm of unspecified part of right bronchus or lung: Secondary | ICD-10-CM

## 2017-10-06 LAB — CBC WITH DIFFERENTIAL (CANCER CENTER ONLY)
Basophils Absolute: 0 10*3/uL (ref 0.0–0.1)
Basophils Relative: 0 %
Eosinophils Absolute: 0 10*3/uL (ref 0.0–0.5)
Eosinophils Relative: 1 %
HEMATOCRIT: 35.1 % — AB (ref 38.4–49.9)
HEMOGLOBIN: 12.1 g/dL — AB (ref 13.0–17.1)
LYMPHS ABS: 0.7 10*3/uL — AB (ref 0.9–3.3)
LYMPHS PCT: 55 %
MCH: 29.5 pg (ref 27.2–33.4)
MCHC: 34.5 g/dL (ref 32.0–36.0)
MCV: 85.6 fL (ref 79.3–98.0)
MONOS PCT: 3 %
Monocytes Absolute: 0 10*3/uL — ABNORMAL LOW (ref 0.1–0.9)
NEUTROS ABS: 0.5 10*3/uL — AB (ref 1.5–6.5)
NEUTROS PCT: 41 %
Platelet Count: 94 10*3/uL — ABNORMAL LOW (ref 140–400)
RBC: 4.1 MIL/uL — ABNORMAL LOW (ref 4.20–5.82)
RDW: 13.3 % (ref 11.0–15.6)
WBC Count: 1.3 10*3/uL — ABNORMAL LOW (ref 4.0–10.3)

## 2017-10-06 LAB — CMP (CANCER CENTER ONLY)
ALK PHOS: 396 U/L — AB (ref 40–150)
ALT: 49 U/L (ref 0–55)
ANION GAP: 10 (ref 3–11)
AST: 43 U/L — ABNORMAL HIGH (ref 5–34)
Albumin: 3.3 g/dL — ABNORMAL LOW (ref 3.5–5.0)
BILIRUBIN TOTAL: 0.6 mg/dL (ref 0.2–1.2)
BUN: 19 mg/dL (ref 7–26)
CALCIUM: 8.6 mg/dL (ref 8.4–10.4)
CO2: 25 mmol/L (ref 22–29)
CREATININE: 1.31 mg/dL — AB (ref 0.70–1.30)
Chloride: 102 mmol/L (ref 98–109)
GFR, Estimated: >60 mL/min (ref >60)
Glucose, Bld: 89 mg/dL (ref 70–140)
Potassium: 4.2 mmol/L (ref 3.5–5.1)
Sodium: 137 mmol/L (ref 136–145)
TOTAL PROTEIN: 6.8 g/dL (ref 6.4–8.3)

## 2017-10-07 ENCOUNTER — Encounter: Payer: Self-pay | Admitting: Medical Oncology

## 2017-10-07 ENCOUNTER — Telehealth: Payer: Self-pay | Admitting: Medical

## 2017-10-07 ENCOUNTER — Ambulatory Visit (HOSPITAL_COMMUNITY)
Admission: RE | Admit: 2017-10-07 | Discharge: 2017-10-07 | Disposition: A | Discharge status: Home / Self Care | Payer: BLUE CROSS/BLUE SHIELD | Source: Ambulatory Visit | Attending: Medical | Admitting: Medical

## 2017-10-07 ENCOUNTER — Inpatient Hospital Stay (HOSPITAL_BASED_OUTPATIENT_CLINIC_OR_DEPARTMENT_OTHER): Payer: BLUE CROSS/BLUE SHIELD | Admitting: Medical

## 2017-10-07 ENCOUNTER — Telehealth: Payer: Self-pay | Admitting: Internal Medicine

## 2017-10-07 VITALS — BP 114/77 | HR 89 | Temp 97.5°F | Resp 17 | Ht 74.0 in | Wt 206.0 lb

## 2017-10-07 DIAGNOSIS — M25421 Effusion, right elbow: Secondary | ICD-10-CM | POA: Insufficient documentation

## 2017-10-07 DIAGNOSIS — C3432 Malignant neoplasm of lower lobe, left bronchus or lung: Secondary | ICD-10-CM | POA: Diagnosis not present

## 2017-10-07 DIAGNOSIS — I82890 Acute embolism and thrombosis of other specified veins: Secondary | ICD-10-CM

## 2017-10-07 DIAGNOSIS — T801XXA Vascular complications following infusion, transfusion and therapeutic injection, initial encounter: Secondary | ICD-10-CM

## 2017-10-07 DIAGNOSIS — I808 Phlebitis and thrombophlebitis of other sites: Secondary | ICD-10-CM

## 2017-10-07 DIAGNOSIS — I82613 Acute embolism and thrombosis of superficial veins of upper extremity, bilateral: Secondary | ICD-10-CM | POA: Insufficient documentation

## 2017-10-07 DIAGNOSIS — I809 Phlebitis and thrombophlebitis of unspecified site: Principal | ICD-10-CM

## 2017-10-07 DIAGNOSIS — I82611 Acute embolism and thrombosis of superficial veins of right upper extremity: Secondary | ICD-10-CM | POA: Insufficient documentation

## 2017-10-07 DIAGNOSIS — D701 Agranulocytosis secondary to cancer chemotherapy: Secondary | ICD-10-CM

## 2017-10-07 DIAGNOSIS — T451X5A Adverse effect of antineoplastic and immunosuppressive drugs, initial encounter: Secondary | ICD-10-CM

## 2017-10-07 DIAGNOSIS — K219 Gastro-esophageal reflux disease without esophagitis: Secondary | ICD-10-CM | POA: Diagnosis not present

## 2017-10-07 MED ORDER — ENOXAPARIN SODIUM 100 MG/ML ~~LOC~~ SOLN
100.0000 mg | Freq: Every day | SUBCUTANEOUS | Status: DC
  Administered 2017-10-07: 100 mg via SUBCUTANEOUS
  Filled 2017-10-07: qty 1

## 2017-10-07 MED ORDER — ESOMEPRAZOLE MAGNESIUM 40 MG PO CPDR: 40.0000 mg | DELAYED_RELEASE_CAPSULE | Freq: Every day | ORAL | 5 refills | Status: AC

## 2017-10-07 MED ORDER — TBO-FILGRASTIM 300 MCG/0.5ML ~~LOC~~ SOSY
300.0000 ug | PREFILLED_SYRINGE | Freq: Every day | SUBCUTANEOUS | Status: DC
  Administered 2017-10-07: 300 ug via SUBCUTANEOUS

## 2017-10-07 MED ORDER — ENOXAPARIN SODIUM 100 MG/ML ~~LOC~~ SOLN: 95.0000 mg | Freq: Every day | SUBCUTANEOUS | Status: DC

## 2017-10-07 MED ORDER — TBO-FILGRASTIM 300 MCG/0.5ML ~~LOC~~ SOSY
PREFILLED_SYRINGE | SUBCUTANEOUS | Status: AC
  Filled 2017-10-07: qty 0.5

## 2017-10-07 MED ORDER — ENOXAPARIN SODIUM 100 MG/ML ~~LOC~~ SOLN: 1.0000 mg/kg | SUBCUTANEOUS | 0 refills | Status: DC

## 2017-10-07 MED ORDER — HYDROCODONE-ACETAMINOPHEN 5-325 MG PO TABS
1.0000 | ORAL_TABLET | Freq: Four times a day (QID) | ORAL | 0 refills | Status: DC | PRN
Start: 2017-10-07 — End: 2017-12-13

## 2017-10-07 MED ORDER — LEVOFLOXACIN 500 MG PO TABS: 500.0000 mg | ORAL_TABLET | Freq: Every day | ORAL | 0 refills | Status: DC

## 2017-10-07 NOTE — Progress Notes (Signed)
Symptoms Management Clinic Progress Note   Oscar Floyd [id number] 01-Jun-1966 52 y.o.  Rue Tinnel is managed by Dr. Eilleen Kempf  Actively treated with chemotherapy: yes  Current Therapy: Carboplatin, Alimta, and Keytruda  Last Treated: 09/29/2017 (cycle 2, day 1)  Assessment: Plan:    Phlebitis after infusion, initial encounter - Plan: levofloxacin (LEVAQUIN) 500 MG tablet, HYDROcodone-acetaminophen (NORCO/VICODIN) 5-325 MG tablet, enoxaparin (LOVENOX) 100 MG/ML injection, enoxaparin (LOVENOX) injection 100 mg, DISCONTINUED: enoxaparin (LOVENOX) 100 MG/ML injection, DISCONTINUED: enoxaparin (LOVENOX) injection 95 mg  Swelling of joint of upper arm, right - Plan: UE VENOUS DUPLEX  Gastroesophageal reflux disease, esophagitis presence not specified - Plan: esomeprazole (NEXIUM) 40 MG capsule  Chemotherapy-induced neutropenia ([place]) - Plan: Tbo-Filgrastim (GRANIX) injection 300 mcg   Phlebitis after infusion was swelling of the right upper arm: The patient was seen and discussed with Dr. Ladell Pier.  The patient was referred for an ultrasound of his right upper extremity which showed no evidence of a DVT but did show an extensive superficial thrombus.  The patient was given a prescription for Levaquin 500 mg p.o. daily times 7 days, Vicodin 5-3 25 every 6 hours as needed for pain, and Lovenox 95 mg subcu times 10 days.  The patient was given a dose of Lovenox today and will return to clinic tomorrow for a second dose.  His aunt is a Marine scientist and is agreed to give him the remaining doses of Lovenox at home.  A referral for a port placement will be made.  Gastroesophageal reflux disease: Patient was given a prescription for Nexium 40 mg once daily.  Chemo-induced neutropenia: The patient's labs from 10/06/2017 returned with a WBC of 1.3 and ANC of 0.5.  Patient was given Granix 300 mcg today with a repeat dose of 300 mcg tomorrow.  He was given a prescription for Levaquin 500 mg  p.o. daily times 7 days.  Please see After Visit Summary for patient specific instructions.  Future Appointments  Date Time Provider [place]  10/08/2017 10:15 AM [facility] INJ NURSE [facility] None  10/13/2017  4:00 PM [facility] LAB 1 [facility] None  10/20/2017  9:00 AM [facility] LAB 2 [facility] None  10/20/2017  9:30 AM [name], MD [facility] None  10/20/2017 10:30 AM [facility] D12 [facility] None  10/27/2017  4:00 PM [facility] LAB 2 [facility] None  11/03/2017  4:00 PM [facility] LAB 1 [facility] None  11/09/2017 10:50 AM [facility]315 MR 3 [facility]315MRI [facility] [address]  11/10/2017  9:00 AM [facility] LAB 5 [facility] None  11/10/2017  9:30 AM [name], MD [facility] None  11/10/2017 10:30 AM [facility] B5 [facility] None  11/15/2017  3:30 PM Bruning, Ashlyn, PA-C [facility] None  11/17/2017  4:00 PM [facility] LAB 1 [facility] None  11/24/2017  4:00 PM [facility] LAB 1 [facility] None  12/01/2017  9:15 AM [facility] LAB 1 [facility] None  12/01/2017  9:45 AM [name], MD [facility] None  12/01/2017 10:45 AM [facility] E16 [facility] None    No orders of the defined types were placed in this encounter.      Subjective:   Patient ID:  Oscar Floyd is a 52 y.o. (DOB April 30, 1966) male.  Chief Complaint:  Chief Complaint  Patient presents with  . 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  The patient originally presented with a right upper lobe lung mass, extensive liver, brain, and bone metastasis.  He was recently transitioned to carboplatin, Alimta, and Keytruda.  He does not have a port.  His first dose of this regiment was given in the left upper extremity with the patient developing a small area of phlebitis along his left distal  lateral forearm.  He has developed an area of induration at that site.  He presents today with an area of erythema, warmth, tenderness, and induration extending from his wrist to his anterior biceps on his right arm.  He also reports having some ongoing GERD and asked for a prescription for Nexium rather than purchasing it over-the-counter.  He acknowledges mild stomach upset and occasional dizziness since Tuesday.  He denies shortness of breath, fevers, chills, or sweats.  His labs from yesterday showed a WBC of 1.3 and an ANC of 0.5.  Dr. Ladell Pier was asked to see the patient with me today as I want his opinion as to whether the patient could be managed as an outpatient for should be admitted for IV antibiotics.  Medications: I have reviewed the patient's current medications.  Allergies: No Known Allergies  Past Medical History:  Diagnosis Date  . Bone metastases ([place]) 08/26/2015  . Encounter for antineoplastic chemotherapy 01/06/2016  . Family history of cancer   . Hypertension   . Lung cancer ([place])    RUL lung with mets to liver, bone and brain    Past Surgical History:  Procedure Laterality Date  . VASECTOMY    . VIDEO BRONCHOSCOPY Bilateral 03/20/2014   Procedure: VIDEO BRONCHOSCOPY WITH FLUORO;  Surgeon: Rigoberto Noel, MD;  Location: [facility];  Service: Cardiopulmonary;  Laterality: Bilateral;  . VIDEO BRONCHOSCOPY Bilateral 07/20/2017   Procedure: VIDEO BRONCHOSCOPY WITH FLUORO;  Surgeon: Rigoberto Noel, MD;  Location: [facility];  Service: Cardiopulmonary;  Laterality: Bilateral;    Family History  Problem Relation Age of Onset  . Cancer Father 48       liposarcoma age 73  . Cancer Mother 63       gist  . Cancer Paternal Grandfather        lung cancer; heavy smoker; also had abdominal cancer in 78's  . Stroke Maternal Grandmother   . Heart disease Maternal Grandfather   . Cancer Other        multiple brothers of PGF with unknown cancer    Social History    Socioeconomic History  . Marital status: Married    Spouse name: Not on file  . Number of children: 2  . Years of education: Not on file  . Highest education level: Not on file  Social Needs  . Financial resource strain: Not on file  . Food insecurity - worry: Not on file  . Food insecurity - inability: Not on file  . Transportation needs - medical: Not on file  . Transportation needs - non-medical: Not on file  Occupational History  . Not on file  Tobacco Use  . Smoking status: Never Smoker  . Smokeless tobacco: Never Used  Substance and Sexual Activity  . Alcohol use: Yes    Comment: socially  . Drug use: No  . Sexual activity: Yes  Other Topics Concern  . Not on file  Social History Narrative  . Not on file    Past Medical History, Surgical history, Social history, and Family history were reviewed and updated as appropriate.   Please see review  of systems for further details on the patient's review from today.   Review of Systems:  Review of Systems  Constitutional: Negative for activity change, chills, diaphoresis and fever.  Respiratory: Negative for cough, chest tightness and shortness of breath.   Cardiovascular: Negative for chest pain and palpitations.  Gastrointestinal:       GERD  Neurological: Positive for dizziness (Occasional).    Objective:   Physical Exam:  BP 114/77 (BP Location: Left Arm, Patient Position: Sitting)   Pulse 89   Temp (!) 97.5 F (36.4 C) (Oral)   Resp 17   Ht 6\' 2"  (1.88 m)   Wt 206 lb (93.4 kg)   SpO2 100%   BMI 26.45 kg/m  ECOG:   Physical Exam  Constitutional: No distress.  HENT:  Head: Normocephalic and atraumatic.  Cardiovascular: Normal rate, regular rhythm and normal heart sounds. Exam reveals no gallop and no friction rub.  No murmur heard. Pulmonary/Chest: Effort normal and breath sounds normal. No respiratory distress. He has no wheezes. He has no rales.  Lymphadenopathy:    He has axillary adenopathy.        Right axillary: Lateral adenopathy present.       Right: No epitrochlear adenopathy present.  Neurological: He is alert. Coordination normal.  Skin: He is not diaphoretic.         Lab Review:     Component Value Date/Time   NA 137 10/06/2017 1551   NA 141 08/31/2017 0841   K 4.2 10/06/2017 1551   K 4.4 08/31/2017 0841   CL 102 10/06/2017 1551   CO2 25 10/06/2017 1551   CO2 26 08/31/2017 0841   GLUCOSE 89 10/06/2017 1551   GLUCOSE 106 08/31/2017 0841   BUN 19 10/06/2017 1551   BUN 14.7 08/31/2017 0841   CREATININE 1.2 08/31/2017 0841   CALCIUM 8.6 10/06/2017 1551   CALCIUM 8.8 08/31/2017 0841   PROT 6.8 10/06/2017 1551   PROT 6.2 (L) 08/31/2017 0841   ALBUMIN 3.3 (L) 10/06/2017 1551   ALBUMIN 3.0 (L) 08/31/2017 0841   AST 43 (H) 10/06/2017 1551   AST 31 08/31/2017 0841   ALT 49 10/06/2017 1551   ALT 17 08/31/2017 0841   ALKPHOS 396 (H) 10/06/2017 1551   ALKPHOS 350 (H) 08/31/2017 0841   BILITOT 0.6 10/06/2017 1551   BILITOT 0.50 08/31/2017 0841   GFRNONAA >60 10/06/2017 1551   GFRAA >60 10/06/2017 1551       Component Value Date/Time   WBC 1.3 (L) 10/06/2017 1551   WBC 4.3 08/26/2017 0816   WBC 3.6 (L) 08/11/2017 0642   RBC 4.10 (L) 10/06/2017 1551   HGB 13.8 08/26/2017 0816   HCT 35.1 (L) 10/06/2017 1551   HCT 40.9 08/26/2017 0816   PLT 94 (L) 10/06/2017 1551   PLT 149 08/26/2017 0816   MCV 85.6 10/06/2017 1551   MCV 88.1 08/26/2017 0816   MCH 29.5 10/06/2017 1551   MCHC 34.5 10/06/2017 1551   RDW 13.3 10/06/2017 1551   RDW 13.6 08/26/2017 0816   LYMPHSABS 0.7 (L) 10/06/2017 1551   LYMPHSABS 1.1 08/26/2017 0816   MONOABS 0.0 (L) 10/06/2017 1551   MONOABS 0.4 08/26/2017 0816   EOSABS 0.0 10/06/2017 1551   EOSABS 0.1 08/26/2017 0816   BASOSABS 0.0 10/06/2017 1551   BASOSABS 0.0 08/26/2017 0816   -------------------------------  Imaging from last 24 hours (if applicable):  Radiology interpretation: Ct Chest W Contrast  Result Date:  09/21/2017 CLINICAL DATA:  Stage IV poorly differentiated  right upper lobe lung adenocarcinoma diagnosed 2015 with disease progression in April 2017 and progressive biopsy-proven left lower lobe lung malignancy and November 2018. Interval chemotherapy. Restaging. EXAM: CT CHEST, ABDOMEN, AND PELVIS WITH CONTRAST TECHNIQUE: Multidetector CT imaging of the chest, abdomen and pelvis was performed following the standard protocol during bolus administration of intravenous contrast. CONTRAST:  156mL ISOVUE-300 IOPAMIDOL (ISOVUE-300) INJECTION 61% COMPARISON:  08/02/2017 PET-CT. 06/14/2017 CT chest, abdomen and pelvis. FINDINGS: CT CHEST FINDINGS Cardiovascular: Normal heart size. Stable small pericardial effusion/thickening. Great vessels are normal in course and caliber. No central pulmonary emboli. Mediastinum/Nodes: No discrete thyroid nodules. Unremarkable esophagus. No pathologically enlarged axillary, mediastinal or hilar lymph nodes. Lungs/Pleura: No pneumothorax. Small dependent left pleural effusion is minimally increased. No right pleural effusion. Poorly marginated 10.3 x 4.9 cm left lower lobe lung mass (series 6/image 91), previously 9.9 x 4.3 cm on 08/02/2017 using similar measurement technique, increased. Patchy confluent poorly marginated subsolid micronodularity throughout the right lower lobe, right middle lobe and posterior upper lobes bilaterally is increased. Irregular 1.9 cm central right upper lobe pulmonary nodule (series 6/image 69), not appreciably changed. Diffuse interlobular septal thickening is mildly increased. Musculoskeletal: Widespread patchy confluent sclerotic osseous metastatic disease throughout the thoracic skeleton has not appreciably changed. CT ABDOMEN PELVIS FINDINGS Hepatobiliary: Normal liver size. No discrete liver lesions. Normal gallbladder with no radiopaque cholelithiasis. No biliary ductal dilatation. Pancreas: No discrete pancreatic mass. Stable contour irregularity of  the superior pancreatic body. No pancreatic duct dilation. Spleen: Normal size. No mass. Adrenals/Urinary Tract: Normal adrenals. Stable subcentimeter hypodense renal cortical lesion in the upper right kidney, considered benign. No new renal lesions. No hydronephrosis. Normal bladder. Stomach/Bowel: Small hiatal hernia. Otherwise normal nondistended stomach. Normal caliber small bowel with no small bowel wall thickening. Normal appendix. Normal large bowel with no diverticulosis, large bowel wall thickening or pericolonic fat stranding. Vascular/Lymphatic: Normal caliber abdominal aorta. Patent portal, splenic, hepatic and renal veins. No pathologically enlarged lymph nodes in the abdomen or pelvis. Reproductive: Normal size prostate. Other: No pneumoperitoneum, ascites or focal fluid collection. Musculoskeletal: No appreciable interval change in widespread patchy confluent sclerotic osseous metastatic disease throughout the lumbar spine, sacrum and bilateral pelvic girdle. Mild lumbar spondylosis. IMPRESSION: 1. Interval progression of widespread pulmonary metastatic disease. Interval growth of the dominant large poorly marginated left lower lobe lung mass. Increased diffuse interlobular septal thickening and poorly marginated subsolid pulmonary micronodularity in both lungs, most prominent in the right lower lobe and posterior upper lobes. 2. Minimally increased small dependent left pleural effusion. Stable small pericardial effusion/thickening. 3. No change in widespread patchy confluent sclerotic osseous metastatic disease throughout the axial and proximal appendicular skeleton. 4. Small hiatal hernia. Electronically Signed   By: Ilona Sorrel M.D.   On: 09/21/2017 13:20   Ct Abdomen Pelvis W Contrast  Result Date: 09/21/2017 CLINICAL DATA:  Stage IV poorly differentiated right upper lobe lung adenocarcinoma diagnosed 2015 with disease progression in April 2017 and progressive biopsy-proven left lower lobe  lung malignancy and November 2018. Interval chemotherapy. Restaging. EXAM: CT CHEST, ABDOMEN, AND PELVIS WITH CONTRAST TECHNIQUE: Multidetector CT imaging of the chest, abdomen and pelvis was performed following the standard protocol during bolus administration of intravenous contrast. CONTRAST:  134mL ISOVUE-300 IOPAMIDOL (ISOVUE-300) INJECTION 61% COMPARISON:  08/02/2017 PET-CT. 06/14/2017 CT chest, abdomen and pelvis. FINDINGS: CT CHEST FINDINGS Cardiovascular: Normal heart size. Stable small pericardial effusion/thickening. Great vessels are normal in course and caliber. No central pulmonary emboli. Mediastinum/Nodes: No discrete thyroid nodules. Unremarkable esophagus. No pathologically  enlarged axillary, mediastinal or hilar lymph nodes. Lungs/Pleura: No pneumothorax. Small dependent left pleural effusion is minimally increased. No right pleural effusion. Poorly marginated 10.3 x 4.9 cm left lower lobe lung mass (series 6/image 91), previously 9.9 x 4.3 cm on 08/02/2017 using similar measurement technique, increased. Patchy confluent poorly marginated subsolid micronodularity throughout the right lower lobe, right middle lobe and posterior upper lobes bilaterally is increased. Irregular 1.9 cm central right upper lobe pulmonary nodule (series 6/image 69), not appreciably changed. Diffuse interlobular septal thickening is mildly increased. Musculoskeletal: Widespread patchy confluent sclerotic osseous metastatic disease throughout the thoracic skeleton has not appreciably changed. CT ABDOMEN PELVIS FINDINGS Hepatobiliary: Normal liver size. No discrete liver lesions. Normal gallbladder with no radiopaque cholelithiasis. No biliary ductal dilatation. Pancreas: No discrete pancreatic mass. Stable contour irregularity of the superior pancreatic body. No pancreatic duct dilation. Spleen: Normal size. No mass. Adrenals/Urinary Tract: Normal adrenals. Stable subcentimeter hypodense renal cortical lesion in the upper  right kidney, considered benign. No new renal lesions. No hydronephrosis. Normal bladder. Stomach/Bowel: Small hiatal hernia. Otherwise normal nondistended stomach. Normal caliber small bowel with no small bowel wall thickening. Normal appendix. Normal large bowel with no diverticulosis, large bowel wall thickening or pericolonic fat stranding. Vascular/Lymphatic: Normal caliber abdominal aorta. Patent portal, splenic, hepatic and renal veins. No pathologically enlarged lymph nodes in the abdomen or pelvis. Reproductive: Normal size prostate. Other: No pneumoperitoneum, ascites or focal fluid collection. Musculoskeletal: No appreciable interval change in widespread patchy confluent sclerotic osseous metastatic disease throughout the lumbar spine, sacrum and bilateral pelvic girdle. Mild lumbar spondylosis. IMPRESSION: 1. Interval progression of widespread pulmonary metastatic disease. Interval growth of the dominant large poorly marginated left lower lobe lung mass. Increased diffuse interlobular septal thickening and poorly marginated subsolid pulmonary micronodularity in both lungs, most prominent in the right lower lobe and posterior upper lobes. 2. Minimally increased small dependent left pleural effusion. Stable small pericardial effusion/thickening. 3. No change in widespread patchy confluent sclerotic osseous metastatic disease throughout the axial and proximal appendicular skeleton. 4. Small hiatal hernia. Electronically Signed   By: Ilona Sorrel M.D.   On: 09/21/2017 13:20        This patient was seen with Dr. Benay Spice with my treatment plan reviewed with him. He expressed agreement with my medical management of this patient.  This was a shared visit with Sandi Mealy.  Mr. Ashe was interviewed and examined. He appears to have extensive superficial thrombophlebitis of the rt. Arm . He will be referred for a doppler to r/o DVT.  He will start antibiotics and lovenox.  He will return for an office visit  1/28.  Julieanne Manson, MD

## 2017-10-07 NOTE — Patient Instructions (Signed)
Enoxaparin injection What is this medicine? ENOXAPARIN (ee nox a PA rin) is used after knee, hip, or abdominal surgeries to prevent blood clotting. It is also used to treat existing blood clots in the lungs or in the veins. This medicine may be used for other purposes; ask your health care provider or pharmacist if you have questions. COMMON BRAND NAME(S): Lovenox What should I tell my health care provider before I take this medicine? They need to know if you have any of these conditions: -bleeding disorders, hemorrhage, or hemophilia -infection of the heart or heart valves -kidney or liver disease -previous stroke -prosthetic heart valve -recent surgery or delivery of a baby -ulcer in the stomach or intestine, diverticulitis, or other bowel disease -an unusual or allergic reaction to enoxaparin, heparin, pork or pork products, other medicines, foods, dyes, or preservatives -pregnant or trying to get pregnant -breast-feeding How should I use this medicine? This medicine is for injection under the skin. It is usually given by a health-care professional. You or a family member may be trained on how to give the injections. If you are to give yourself injections, make sure you understand how to use the syringe, measure the dose if necessary, and give the injection. To avoid bruising, do not rub the site where this medicine has been injected. Do not take your medicine more often than directed. Do not stop taking except on the advice of your doctor or health care professional. Make sure you receive a puncture-resistant container to dispose of the needles and syringes once you have finished with them. Do not reuse these items. Return the container to your doctor or health care professional for proper disposal. Talk to your pediatrician regarding the use of this medicine in children. Special care may be needed. Overdosage: If you think you have taken too much of this medicine contact a poison control  center or emergency room at once. NOTE: This medicine is only for you. Do not share this medicine with others. What if I miss a dose? If you miss a dose, take it as soon as you can. If it is almost time for your next dose, take only that dose. Do not take double or extra doses. What may interact with this medicine? -aspirin and aspirin-like medicines -certain medicines that treat or prevent blood clots -dipyridamole -NSAIDs, medicines for pain and inflammation, like ibuprofen or naproxen This list may not describe all possible interactions. Give your health care provider a list of all the medicines, herbs, non-prescription drugs, or dietary supplements you use. Also tell them if you smoke, drink alcohol, or use illegal drugs. Some items may interact with your medicine. What should I watch for while using this medicine? Visit your doctor or health care professional for regular checks on your progress. Your condition will be monitored carefully while you are receiving this medicine. Notify your doctor or health care professional and seek emergency treatment if you develop breathing problems; changes in vision; chest pain; severe, sudden headache; pain, swelling, warmth in the leg; trouble speaking; sudden numbness or weakness of the face, arm, or leg. These can be signs that your condition has gotten worse. If you are going to have surgery, tell your doctor or health care professional that you are taking this medicine. Do not stop taking this medicine without first talking to your doctor. Be sure to refill your prescription before you run out of medicine. Avoid sports and activities that might cause injury while you are using this medicine. Severe  falls or injuries can cause unseen bleeding. Be careful when using sharp tools or knives. Consider using an Copy. Take special care brushing or flossing your teeth. Report any injuries, bruising, or red spots on the skin to your doctor or health care  professional. What side effects may I notice from receiving this medicine? Side effects that you should report to your doctor or health care professional as soon as possible: -allergic reactions like skin rash, itching or hives, swelling of the face, lips, or tongue -feeling faint or lightheaded, falls -signs and symptoms of bleeding such as bloody or black, tarry stools; red or dark-brown urine; spitting up blood or brown material that looks like coffee grounds; red spots on the skin; unusual bruising or bleeding from the eye, gums, or nose Side effects that usually do not require medical attention (report to your doctor or health care professional if they continue or are bothersome): -pain, redness, or irritation at site where injected This list may not describe all possible side effects. Call your doctor for medical advice about side effects. You may report side effects to FDA at 1-800-FDA-1088. Where should I keep my medicine? Keep out of the reach of children. Store at room temperature between 15 and 30 degrees C (59 and 86 degrees F). Do not freeze. If your injections have been specially prepared, you may need to store them in the refrigerator. Ask your pharmacist. Throw away any unused medicine after the expiration date. NOTE: This sheet is a summary. It may not cover all possible information. If you have questions about this medicine, talk to your doctor, pharmacist, or health care provider.  2018 [employer] Standard (2014-01-01 16:06:21)

## 2017-10-07 NOTE — Telephone Encounter (Signed)
Called patient regarding [facility] appt per 1/25 sch msg - spoke with patient regarding appts that were added.

## 2017-10-07 NOTE — Progress Notes (Signed)
Right upper extremity venous duplex has been completed. Negative for DVT. There is evidence of acute superficial vein thrombosis involving the basilic, and cephalic veins of the right upper extremities. There is also evidence of acute superficial vein thrombosis involving the cephalic vein of the left upper extremity.  Results were given to Sandi Mealy PA.  10/07/17 1:22 PM Carlos Levering RVT

## 2017-10-07 NOTE — Telephone Encounter (Signed)
No 1/25 los.  

## 2017-10-08 ENCOUNTER — Inpatient Hospital Stay: Payer: BLUE CROSS/BLUE SHIELD

## 2017-10-08 VITALS — BP 112/82 | HR 106 | Temp 97.8°F | Resp 20

## 2017-10-08 DIAGNOSIS — C3432 Malignant neoplasm of lower lobe, left bronchus or lung: Secondary | ICD-10-CM | POA: Diagnosis not present

## 2017-10-08 DIAGNOSIS — C3491 Malignant neoplasm of unspecified part of right bronchus or lung: Secondary | ICD-10-CM

## 2017-10-08 DIAGNOSIS — C3400 Malignant neoplasm of unspecified main bronchus: Secondary | ICD-10-CM

## 2017-10-08 DIAGNOSIS — C7951 Secondary malignant neoplasm of bone: Secondary | ICD-10-CM

## 2017-10-08 MED ORDER — TBO-FILGRASTIM 300 MCG/0.5ML ~~LOC~~ SOSY
300.0000 ug | PREFILLED_SYRINGE | Freq: Once | SUBCUTANEOUS | Status: AC
  Administered 2017-10-08: 300 ug via SUBCUTANEOUS

## 2017-10-08 MED ORDER — TBO-FILGRASTIM 300 MCG/0.5ML ~~LOC~~ SOSY
PREFILLED_SYRINGE | SUBCUTANEOUS | Status: AC
  Filled 2017-10-08: qty 0.5

## 2017-10-08 MED ORDER — ENOXAPARIN SODIUM 100 MG/ML ~~LOC~~ SOLN
100.0000 mg | Freq: Once | SUBCUTANEOUS | Status: AC
  Administered 2017-10-08: 100 mg via SUBCUTANEOUS

## 2017-10-08 NOTE — Patient Instructions (Signed)
Tbo-Filgrastim injection What is this medicine? TBO-FILGRASTIM (T B O fil GRA stim) is a granulocyte colony-stimulating factor that stimulates the growth of neutrophils, a type of white blood cell important in the body's fight against infection. It is used to reduce the incidence of fever and infection in patients with certain types of cancer who are receiving chemotherapy that affects the bone marrow. This medicine may be used for other purposes; ask your health care provider or pharmacist if you have questions. COMMON BRAND NAME(S): Granix What should I tell my health care provider before I take this medicine? They need to know if you have any of these conditions: -bone scan or tests planned -kidney disease -sickle cell anemia -an unusual or allergic reaction to tbo-filgrastim, filgrastim, pegfilgrastim, other medicines, foods, dyes, or preservatives -pregnant or trying to get pregnant -breast-feeding How should I use this medicine? This medicine is for injection under the skin. If you get this medicine at home, you will be taught how to prepare and give this medicine. Refer to the Instructions for Use that come with your medication packaging. Use exactly as directed. Take your medicine at regular intervals. Do not take your medicine more often than directed. It is important that you put your used needles and syringes in a special sharps container. Do not put them in a trash can. If you do not have a sharps container, call your pharmacist or healthcare provider to get one. Talk to your pediatrician regarding the use of this medicine in children. Special care may be needed. Overdosage: If you think you have taken too much of this medicine contact a poison control center or emergency room at once. NOTE: This medicine is only for you. Do not share this medicine with others. What if I miss a dose? It is important not to miss your dose. Call your doctor or health care professional if you miss a  dose. What may interact with this medicine? This medicine may interact with the following medications: -medicines that may cause a release of neutrophils, such as lithium This list may not describe all possible interactions. Give your health care provider a list of all the medicines, herbs, non-prescription drugs, or dietary supplements you use. Also tell them if you smoke, drink alcohol, or use illegal drugs. Some items may interact with your medicine. What should I watch for while using this medicine? You may need blood work done while you are taking this medicine. What side effects may I notice from receiving this medicine? Side effects that you should report to your doctor or health care professional as soon as possible: -allergic reactions like skin rash, itching or hives, swelling of the face, lips, or tongue -blood in the urine -dark urine -dizziness -fast heartbeat -feeling faint -shortness of breath or breathing problems -signs and symptoms of infection like fever or chills; cough; or sore throat -signs and symptoms of kidney injury like trouble passing urine or change in the amount of urine -stomach or side pain, or pain at the shoulder -sweating -swelling of the legs, ankles, or abdomen -tiredness Side effects that usually do not require medical attention (report to your doctor or health care professional if they continue or are bothersome): -bone pain -headache -muscle pain -vomiting This list may not describe all possible side effects. Call your doctor for medical advice about side effects. You may report side effects to FDA at 1-800-FDA-1088. Where should I keep my medicine? Keep out of the reach of children. Store in a refrigerator between   2 and 8 degrees C (36 and 46 degrees F). Keep in carton to protect from light. Throw away this medicine if it is left out of the refrigerator for more than 5 consecutive days. Throw away any unused medicine after the expiration  date. NOTE: This sheet is a summary. It may not cover all possible information. If you have questions about this medicine, talk to your doctor, pharmacist, or health care provider.  2018 Elsevier/Gold Standard (2015-10-20 19:07:04) Enoxaparin injection What is this medicine? ENOXAPARIN (ee nox a PA rin) is used after knee, hip, or abdominal surgeries to prevent blood clotting. It is also used to treat existing blood clots in the lungs or in the veins. This medicine may be used for other purposes; ask your health care provider or pharmacist if you have questions. COMMON BRAND NAME(S): Lovenox What should I tell my health care provider before I take this medicine? They need to know if you have any of these conditions: -bleeding disorders, hemorrhage, or hemophilia -infection of the heart or heart valves -kidney or liver disease -previous stroke -prosthetic heart valve -recent surgery or delivery of a baby -ulcer in the stomach or intestine, diverticulitis, or other bowel disease -an unusual or allergic reaction to enoxaparin, heparin, pork or pork products, other medicines, foods, dyes, or preservatives -pregnant or trying to get pregnant -breast-feeding How should I use this medicine? This medicine is for injection under the skin. It is usually given by a health-care professional. You or a family member may be trained on how to give the injections. If you are to give yourself injections, make sure you understand how to use the syringe, measure the dose if necessary, and give the injection. To avoid bruising, do not rub the site where this medicine has been injected. Do not take your medicine more often than directed. Do not stop taking except on the advice of your doctor or health care professional. Make sure you receive a puncture-resistant container to dispose of the needles and syringes once you have finished with them. Do not reuse these items. Return the container to your doctor or health  care professional for proper disposal. Talk to your pediatrician regarding the use of this medicine in children. Special care may be needed. Overdosage: If you think you have taken too much of this medicine contact a poison control center or emergency room at once. NOTE: This medicine is only for you. Do not share this medicine with others. What if I miss a dose? If you miss a dose, take it as soon as you can. If it is almost time for your next dose, take only that dose. Do not take double or extra doses. What may interact with this medicine? -aspirin and aspirin-like medicines -certain medicines that treat or prevent blood clots -dipyridamole -NSAIDs, medicines for pain and inflammation, like ibuprofen or naproxen This list may not describe all possible interactions. Give your health care provider a list of all the medicines, herbs, non-prescription drugs, or dietary supplements you use. Also tell them if you smoke, drink alcohol, or use illegal drugs. Some items may interact with your medicine. What should I watch for while using this medicine? Visit your doctor or health care professional for regular checks on your progress. Your condition will be monitored carefully while you are receiving this medicine. Notify your doctor or health care professional and seek emergency treatment if you develop breathing problems; changes in vision; chest pain; severe, sudden headache; pain, swelling, warmth in the leg; trouble  speaking; sudden numbness or weakness of the face, arm, or leg. These can be signs that your condition has gotten worse. If you are going to have surgery, tell your doctor or health care professional that you are taking this medicine. Do not stop taking this medicine without first talking to your doctor. Be sure to refill your prescription before you run out of medicine. Avoid sports and activities that might cause injury while you are using this medicine. Severe falls or injuries can  cause unseen bleeding. Be careful when using sharp tools or knives. Consider using an Copy. Take special care brushing or flossing your teeth. Report any injuries, bruising, or red spots on the skin to your doctor or health care professional. What side effects may I notice from receiving this medicine? Side effects that you should report to your doctor or health care professional as soon as possible: -allergic reactions like skin rash, itching or hives, swelling of the face, lips, or tongue -feeling faint or lightheaded, falls -signs and symptoms of bleeding such as bloody or black, tarry stools; red or dark-brown urine; spitting up blood or brown material that looks like coffee grounds; red spots on the skin; unusual bruising or bleeding from the eye, gums, or nose Side effects that usually do not require medical attention (report to your doctor or health care professional if they continue or are bothersome): -pain, redness, or irritation at site where injected This list may not describe all possible side effects. Call your doctor for medical advice about side effects. You may report side effects to FDA at 1-800-FDA-1088. Where should I keep my medicine? Keep out of the reach of children. Store at room temperature between 15 and 30 degrees C (59 and 86 degrees F). Do not freeze. If your injections have been specially prepared, you may need to store them in the refrigerator. Ask your pharmacist. Throw away any unused medicine after the expiration date. NOTE: This sheet is a summary. It may not cover all possible information. If you have questions about this medicine, talk to your doctor, pharmacist, or health care provider.  2018 Elsevier/Gold Standard (2014-01-01 16:06:21)

## 2017-10-10 ENCOUNTER — Telehealth: Payer: Self-pay | Admitting: *Deleted

## 2017-10-10 ENCOUNTER — Encounter: Payer: Self-pay | Admitting: Internal Medicine

## 2017-10-10 ENCOUNTER — Other Ambulatory Visit: Payer: Self-pay | Admitting: Medical

## 2017-10-10 DIAGNOSIS — C3491 Malignant neoplasm of unspecified part of right bronchus or lung: Secondary | ICD-10-CM

## 2017-10-10 NOTE — Progress Notes (Signed)
Pt is approved w/ [employer] for [employer] from 09/13/17 to 09/12/18 for $25,000.

## 2017-10-10 NOTE — Telephone Encounter (Signed)
Oncology Nurse Navigator Documentation  Oncology Nurse Navigator Flowsheets 10/10/2017  Navigator Location [facility]  Navigator Encounter Type Telephone/I received a message from patient. I called him back. I clarified his question regarding follow up.  I updated him he will be getting port placed. I updated Dr. Julien Nordmann on follow up and his arm.  No new orders received.   Telephone Incoming Call;Outgoing Call  Treatment Phase Treatment  Barriers/Navigation Needs Education  Education Other  Interventions Education  Education Method Verbal  Acuity Level 2  Acuity Level 2 Educational needs  Time Spent with Patient 30

## 2017-10-12 ENCOUNTER — Telehealth: Payer: Self-pay | Admitting: *Deleted

## 2017-10-12 NOTE — Telephone Encounter (Signed)
Oncology Nurse Navigator Documentation  Oncology Nurse Navigator Flowsheets 10/12/2017  Navigator Location [facility]  Navigator Encounter Type Telephone/patient called with complaints of watery nose and eyes and took a Zyrtec.  I updated Dr. Julien Nordmann. No new orders received. I updated patient that this can happen with the chemo he is on.  He was thankful for the update.   Telephone Outgoing Call;Incoming Call  Treatment Phase Treatment  Barriers/Navigation Needs Education  Education Other  Interventions Education  Education Method Verbal  Acuity Level 2  Time Spent with Patient 30

## 2017-10-13 ENCOUNTER — Inpatient Hospital Stay: Payer: BLUE CROSS/BLUE SHIELD

## 2017-10-13 DIAGNOSIS — C3432 Malignant neoplasm of lower lobe, left bronchus or lung: Secondary | ICD-10-CM | POA: Diagnosis not present

## 2017-10-13 DIAGNOSIS — C3491 Malignant neoplasm of unspecified part of right bronchus or lung: Secondary | ICD-10-CM

## 2017-10-13 LAB — CMP (CANCER CENTER ONLY)
ALBUMIN: 3.2 g/dL — AB (ref 3.5–5.0)
ALK PHOS: 526 U/L — AB (ref 40–150)
ALT: 87 U/L — ABNORMAL HIGH (ref 0–55)
ANION GAP: 10 (ref 3–11)
AST: 87 U/L — ABNORMAL HIGH (ref 5–34)
BUN: 13 mg/dL (ref 7–26)
CO2: 25 mmol/L (ref 22–29)
Calcium: 8.7 mg/dL (ref 8.4–10.4)
Chloride: 106 mmol/L (ref 98–109)
Creatinine: 1.34 mg/dL — ABNORMAL HIGH (ref 0.70–1.30)
GFR, Est AFR Am: >60 mL/min (ref >60)
GFR, Estimated: >60 mL/min (ref >60)
GLUCOSE: 99 mg/dL (ref 70–140)
Potassium: 4 mmol/L (ref 3.5–5.1)
SODIUM: 141 mmol/L (ref 136–145)
Total Bilirubin: 0.4 mg/dL (ref 0.2–1.2)
Total Protein: 6.6 g/dL (ref 6.4–8.3)

## 2017-10-13 LAB — CBC WITH DIFFERENTIAL (CANCER CENTER ONLY)
BASOS ABS: 0.1 10*3/uL (ref 0.0–0.1)
BASOS PCT: 2 %
EOS ABS: 0.1 10*3/uL (ref 0.0–0.5)
Eosinophils Relative: 2 %
HCT: 31.2 % — ABNORMAL LOW (ref 38.4–49.9)
HEMOGLOBIN: 10.4 g/dL — AB (ref 13.0–17.1)
Lymphocytes Relative: 12 %
Lymphs Abs: 0.5 10*3/uL — ABNORMAL LOW (ref 0.9–3.3)
MCH: 29.2 pg (ref 27.2–33.4)
MCHC: 33.3 g/dL (ref 32.0–36.0)
MCV: 87.6 fL (ref 79.3–98.0)
MONOS PCT: 18 %
Monocytes Absolute: 0.7 10*3/uL (ref 0.1–0.9)
NEUTROS PCT: 66 %
Neutro Abs: 2.7 10*3/uL (ref 1.5–6.5)
Platelet Count: 79 10*3/uL — ABNORMAL LOW (ref 140–400)
RBC: 3.56 MIL/uL — ABNORMAL LOW (ref 4.20–5.82)
RDW: 14.6 % (ref 11.0–14.6)
WBC Count: 4 10*3/uL (ref 4.0–10.3)

## 2017-10-14 ENCOUNTER — Other Ambulatory Visit: Payer: Self-pay | Admitting: Internal Medicine

## 2017-10-14 ENCOUNTER — Telehealth: Payer: Self-pay | Admitting: *Deleted

## 2017-10-14 DIAGNOSIS — C3491 Malignant neoplasm of unspecified part of right bronchus or lung: Secondary | ICD-10-CM

## 2017-10-14 NOTE — Telephone Encounter (Signed)
Oncology Nurse Navigator Documentation  Oncology Nurse Navigator Flowsheets 10/14/2017  Navigator Location [facility]  Navigator Encounter Type Telephone/Mr. Muzquiz called me today asking how his lab work was. I updated Dr. Julien Nordmann. He stated to call patient back and update on labs and of referral to [facility]. I called Mr. Hall Busing and updated. He was thankful for the call.   Telephone Incoming Call;Outgoing Call  Treatment Phase Treatment  Barriers/Navigation Needs Coordination of Care;Education  Education Other  Interventions Education;Coordination of Care  Coordination of Care Other  Education Method Verbal  Acuity Level 2  Time Spent with Patient 59

## 2017-10-17 ENCOUNTER — Telehealth: Payer: Self-pay | Admitting: Internal Medicine

## 2017-10-17 NOTE — Telephone Encounter (Signed)
Faxed record to Dr. Vertell Limber office. The new pt scheduler will call pt with appt

## 2017-10-18 ENCOUNTER — Other Ambulatory Visit: Payer: Self-pay | Admitting: Radiology

## 2017-10-19 ENCOUNTER — Encounter (HOSPITAL_COMMUNITY): Payer: Self-pay

## 2017-10-19 ENCOUNTER — Ambulatory Visit (HOSPITAL_COMMUNITY)
Admission: RE | Admit: 2017-10-19 | Discharge: 2017-10-19 | Disposition: A | Discharge status: Home / Self Care | Payer: BLUE CROSS/BLUE SHIELD | Source: Ambulatory Visit | Attending: Medical | Admitting: Medical

## 2017-10-19 ENCOUNTER — Other Ambulatory Visit: Payer: Self-pay | Admitting: Medical

## 2017-10-19 DIAGNOSIS — Z791 Long term (current) use of non-steroidal anti-inflammatories (NSAID): Secondary | ICD-10-CM | POA: Diagnosis not present

## 2017-10-19 DIAGNOSIS — Z79899 Other long term (current) drug therapy: Secondary | ICD-10-CM | POA: Insufficient documentation

## 2017-10-19 DIAGNOSIS — I10 Essential (primary) hypertension: Secondary | ICD-10-CM | POA: Diagnosis not present

## 2017-10-19 DIAGNOSIS — C3491 Malignant neoplasm of unspecified part of right bronchus or lung: Secondary | ICD-10-CM

## 2017-10-19 DIAGNOSIS — Z809 Family history of malignant neoplasm, unspecified: Secondary | ICD-10-CM | POA: Insufficient documentation

## 2017-10-19 DIAGNOSIS — C3492 Malignant neoplasm of unspecified part of left bronchus or lung: Secondary | ICD-10-CM | POA: Insufficient documentation

## 2017-10-19 DIAGNOSIS — Z7951 Long term (current) use of inhaled steroids: Secondary | ICD-10-CM | POA: Insufficient documentation

## 2017-10-19 HISTORY — PX: IR FLUORO GUIDE PORT INSERTION RIGHT: IMG5741

## 2017-10-19 HISTORY — PX: IR US GUIDE VASC ACCESS RIGHT: IMG2390

## 2017-10-19 LAB — CBC WITH DIFFERENTIAL/PLATELET
BASOS PCT: 0 %
Basophils Absolute: 0 10*3/uL (ref 0.0–0.1)
EOS ABS: 0 10*3/uL (ref 0.0–0.7)
EOS PCT: 0 %
HCT: 30.5 % — ABNORMAL LOW (ref 39.0–52.0)
Hemoglobin: 10.3 g/dL — ABNORMAL LOW (ref 13.0–17.0)
Lymphocytes Relative: 8 %
Lymphs Abs: 0.8 10*3/uL (ref 0.7–4.0)
MCH: 29.3 pg (ref 26.0–34.0)
MCHC: 33.8 g/dL (ref 30.0–36.0)
MCV: 86.6 fL (ref 78.0–100.0)
MONO ABS: 0.4 10*3/uL (ref 0.1–1.0)
Monocytes Relative: 4 %
NEUTROS PCT: 88 %
Neutro Abs: 8.4 10*3/uL — ABNORMAL HIGH (ref 1.7–7.7)
PLATELETS: 166 10*3/uL (ref 150–400)
RBC: 3.52 MIL/uL — ABNORMAL LOW (ref 4.22–5.81)
RDW: 15.6 % — AB (ref 11.5–15.5)
WBC: 9.7 10*3/uL (ref 4.0–10.5)

## 2017-10-19 LAB — PROTIME-INR
INR: 0.99
PROTHROMBIN TIME: 13 s (ref 11.4–15.2)

## 2017-10-19 MED ORDER — FENTANYL CITRATE (PF) 100 MCG/2ML IJ SOLN
INTRAMUSCULAR | Status: AC | PRN
  Administered 2017-10-19: 50 ug via INTRAVENOUS
  Administered 2017-10-19: 25 ug via INTRAVENOUS
  Administered 2017-10-19: 50 ug via INTRAVENOUS

## 2017-10-19 MED ORDER — FENTANYL CITRATE (PF) 100 MCG/2ML IJ SOLN
INTRAMUSCULAR | Status: AC
  Filled 2017-10-19: qty 4

## 2017-10-19 MED ORDER — CEFAZOLIN SODIUM-DEXTROSE 2-4 GM/100ML-% IV SOLN
2.0000 g | INTRAVENOUS | Status: AC
  Administered 2017-10-19: 2 g via INTRAVENOUS

## 2017-10-19 MED ORDER — LIDOCAINE HCL 1 % IJ SOLN
INTRAMUSCULAR | Status: AC
  Filled 2017-10-19: qty 20

## 2017-10-19 MED ORDER — MIDAZOLAM HCL 2 MG/2ML IJ SOLN
INTRAMUSCULAR | Status: AC | PRN
  Administered 2017-10-19 (×4): 1 mg via INTRAVENOUS

## 2017-10-19 MED ORDER — CEFAZOLIN SODIUM-DEXTROSE 2-4 GM/100ML-% IV SOLN
INTRAVENOUS | Status: DC
Start: 2017-10-19 — End: 2017-10-20
  Filled 2017-10-19: qty 100

## 2017-10-19 MED ORDER — SODIUM CHLORIDE 0.9 % IV SOLN
INTRAVENOUS | Status: DC
  Administered 2017-10-19: 12:00:00 via INTRAVENOUS

## 2017-10-19 MED ORDER — HEPARIN SOD (PORK) LOCK FLUSH 100 UNIT/ML IV SOLN
INTRAVENOUS | Status: AC | PRN
  Administered 2017-10-19: 500 [IU]

## 2017-10-19 MED ORDER — LIDOCAINE-EPINEPHRINE (PF) 2 %-1:200000 IJ SOLN
INTRAMUSCULAR | Status: AC
  Filled 2017-10-19: qty 20

## 2017-10-19 MED ORDER — LIDOCAINE HCL 1 % IJ SOLN
INTRAMUSCULAR | Status: AC | PRN
  Administered 2017-10-19: 5 mL

## 2017-10-19 MED ORDER — MIDAZOLAM HCL 2 MG/2ML IJ SOLN
INTRAMUSCULAR | Status: AC
  Filled 2017-10-19: qty 6

## 2017-10-19 MED ORDER — LIDOCAINE-EPINEPHRINE (PF) 2 %-1:200000 IJ SOLN
INTRAMUSCULAR | Status: AC | PRN
  Administered 2017-10-19: 10 mL

## 2017-10-19 MED ORDER — HEPARIN SOD (PORK) LOCK FLUSH 100 UNIT/ML IV SOLN
INTRAVENOUS | Status: AC
  Filled 2017-10-19: qty 5

## 2017-10-19 NOTE — Sedation Documentation (Signed)
Patient denies pain and is resting comfortably.  

## 2017-10-19 NOTE — Sedation Documentation (Signed)
Patient is resting comfortably. 

## 2017-10-19 NOTE — Procedures (Signed)
Interventional Radiology Procedure Note  Procedure: Placement of a right IJ approach single lumen PowerPort.  Tip is positioned at the superior cavoatrial junction and catheter is ready for immediate use.  Complications: No immediate Recommendations:  - Ok to shower tomorrow - Do not submerge for 7 days - Routine line care   Signed,  Nishan Ovens K. Deana Krock, MD   

## 2017-10-19 NOTE — H&P (Signed)
Referring Physician(s): Mohamed,M  Supervising Physician: Jacqulynn Cadet  Patient Status:  [facility]  Chief Complaint:  "I'm getting a port"  Subjective: Patient familiar to IR service from prior left lung nodule biopsy on 08/11/17 which revealed adenocarcinoma.  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  Recent imaging shows interval progression of widespread pulmonary disease.  He presents today for Port-A-Cath placement for additional chemotherapy.  He currently denies fever, headache, chest pain, nausea, vomiting or bleeding.  He does have dyspnea, occasional cough, some mild right abdominal soreness as well as back pain. Past Medical History:  Diagnosis Date  . Bone metastases ([place]) 08/26/2015  . Encounter for antineoplastic chemotherapy 01/06/2016  . Family history of cancer   . Hypertension   . Lung cancer ([place])    RUL lung with mets to liver, bone and brain   Past Surgical History:  Procedure Laterality Date  . VASECTOMY    . VIDEO BRONCHOSCOPY Bilateral 03/20/2014   Procedure: VIDEO BRONCHOSCOPY WITH FLUORO;  Surgeon: Rigoberto Noel, MD;  Location: [facility];  Service: Cardiopulmonary;  Laterality: Bilateral;  . VIDEO BRONCHOSCOPY Bilateral 07/20/2017   Procedure: VIDEO BRONCHOSCOPY WITH FLUORO;  Surgeon: Rigoberto Noel, MD;  Location: [facility];  Service: Cardiopulmonary;  Laterality: Bilateral;      Allergies: Patient has no known allergies.  Medications: Prior to Admission medications   Medication Sig Start Date End Date Taking? Authorizing Provider  albuterol (PROVENTIL HFA;VENTOLIN HFA) 108 (90 Base) MCG/ACT inhaler Inhale 2 puffs into the lungs every 6 (six) hours as needed for wheezing or shortness of breath. 08/31/17   Tanner, Lyndon Code., PA-C  ALPRAZolam Duanne Moron) 1 MG tablet Take 0.5-1 mg by mouth. 04/16/14   [provider]  dexamethasone (DECADRON) 4 MG tablet 4 mg p.o. twice daily the day  before, day of and day after chemotherapy every 3 weeks 09/05/17   [name], MD  diazepam (VALIUM) 10 MG tablet Take 10 mg by mouth. 11/06/15   [provider]  docusate sodium (COLACE) 100 MG capsule Take 100 mg by mouth daily.    [provider]  enoxaparin (LOVENOX) 100 MG/ML injection Inject 0.95 mLs (95 mg total) into the skin daily for 7 days. 10/09/17 10/16/17  Tanner, Lyndon Code., PA-C  esomeprazole (NEXIUM) 40 MG capsule Take 1 capsule (40 mg total) by mouth daily at 12 noon. 10/07/17   Tanner, Lyndon Code., PA-C  Fluticasone Propionate, Inhal, 100 MCG/BLIST AEPB Inhale 2 puffs into the lungs 2 (two) times daily. 08/31/17   Tanner, Lyndon Code., PA-C  folic acid (FOLVITE) 1 MG tablet Take 1 tablet (1 mg total) by mouth daily. 09/05/17   [name], MD  HYDROcodone-acetaminophen (NORCO/VICODIN) 5-325 MG tablet Take 1 tablet by mouth every 6 (six) hours as needed for moderate pain. 10/07/17   Tanner, Lyndon Code., PA-C  Hydrocodone-Homatropine 5-1.5 MG TABS Take 1 tablet by mouth 4 (four) times daily as needed. Patient not taking: Reported on 10/07/2017 08/23/17   [name], MD  ibuprofen (ADVIL,MOTRIN) 800 MG tablet Take by mouth. 11/28/13   [provider]  levofloxacin (LEVAQUIN) 500 MG tablet Take 1 tablet (500 mg total) by mouth daily. 10/07/17   Tanner, Lyndon Code., PA-C  MELATONIN PO Take 20 mg by mouth.    [provider]  mirtazapine (REMERON) 30 MG tablet Take 1 tablet (30 mg total) by mouth at bedtime. Patient not taking: Reported on 09/29/2017 02/07/15  [name], MD  naproxen sodium (ANAPROX) 220 MG tablet Take 220 mg 2 (two) times daily as needed by mouth.     [provider]  prochlorperazine (COMPAZINE) 10 MG tablet Take 1 tablet (10 mg total) by mouth every 6 (six) hours as needed for nausea or vomiting. 09/05/17   [name], MD     Vital Signs: BP (!) 142/97 (BP Location: Right Arm)   Pulse 82   Temp 97.7 F (36.5 C) (Oral)    Resp 18   SpO2 100%   Physical Exam awake, alert.  Chest with slightly diminished breath sounds left base, right clear.  Heart with regular rate and rhythm.  Abdomen soft, positive bowel sounds, some mild right lower quadrant tenderness to palpation.  No lower extremity edema.  Imaging: No results found.  Labs:  CBC: Recent Labs    06/27/17 0842 07/26/17 0845 08/11/17 0642 08/26/17 0816 09/29/17 1114 10/06/17 1551 10/13/17 1556  WBC 3.2* 5.1 3.6* 4.3 2.5* 1.3* 4.0  HGB 13.3 14.9 13.9 13.8  --   --   --   HCT 39.5 44.7 40.4 40.9 35.8* 35.1* 31.2*  PLT 131* 164 171 149 238 94* 79*    COAGS: Recent Labs    08/11/17 0642  INR 0.99  APTT 30    BMP: Recent Labs    08/31/17 0841 09/29/17 1114 10/06/17 1551 10/13/17 1556  NA 141 140 137 141  K 4.4 3.9 4.2 4.0  CL  --  111* 102 106  CO2 26 21* 25 25  GLUCOSE 106 90 89 99  BUN 14.7 13 19 13   CALCIUM 8.8 8.8 8.6 8.7  CREATININE 1.2 1.12 1.31* 1.34*  GFRNONAA  --  >60 >60 >60  GFRAA  --  >60 >60 >60    LIVER FUNCTION TESTS: Recent Labs    08/31/17 0841 09/29/17 1114 10/06/17 1551 10/13/17 1556  BILITOT 0.50 0.3 0.6 0.4  AST 31 41* 43* 87*  ALT 17 36 49 87*  ALKPHOS 350* 428* 396* 526*  PROT 6.2* 6.5 6.8 6.6  ALBUMIN 3.0* 3.1* 3.3* 3.2*    Assessment and Plan:  Pt with known history of 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 S/p left lung mass bx 08/11/17 revealing adenocarcinoma as well.  Recent imaging shows interval progression of widespread pulmonary disease.  He presents today for Port-A-Cath placement for additional chemotherapy. Risks and benefits discussed with the patient including, but not limited to bleeding, infection, pneumothorax, or fibrin sheath development and need for additional procedures.All of the patient's questions were answered, patient is agreeable to proceed.Consent signed and in chart.Labs pend.     Electronically Signed: D. Rowe Robert,  PA-C 10/19/2017, 11:56 AM   I spent a total of 20 minutes at the the patient's bedside AND on the patient's hospital floor or unit, greater than 50% of which was counseling/coordinating care for Port-A-Cath placement

## 2017-10-19 NOTE — Discharge Instructions (Addendum)
Implanted Port Insertion, Care After °This sheet gives you information about how to care for yourself after your procedure. Your health care provider may also give you more specific instructions. If you have problems or questions, contact your health care provider. °What can I expect after the procedure? °After your procedure, it is common to have: °· Discomfort at the port insertion site. °· Bruising on the skin over the port. This should improve over 3-4 days. ° °Follow these instructions at home: °Port care °· After your port is placed, you will get a manufacturer's information card. The card has information about your port. Keep this card with you at all times. °· Take care of the port as told by your health care provider. Ask your health care provider if you or a family member can get training for taking care of the port at home. A home health care nurse may also take care of the port. °· Make sure to remember what type of port you have. °Incision care °· Follow instructions from your health care provider about how to take care of your port insertion site. Make sure you: °? Wash your hands with soap and water before you change your bandage (dressing). If soap and water are not available, use hand sanitizer. °? Change your dressing as told by your health care provider. °? Leave stitches (sutures), skin glue, or adhesive strips in place. These skin closures may need to stay in place for 2 weeks or longer. If adhesive strip edges start to loosen and curl up, you may trim the loose edges. Do not remove adhesive strips completely unless your health care provider tells you to do that. °· Check your port insertion site every day for signs of infection. Check for: °? More redness, swelling, or pain. °? More fluid or blood. °? Warmth. °? Pus or a bad smell. °General instructions °· Do not take baths, swim, or use a hot tub until your health care provider approves. °· Do not lift anything that is heavier than 10 lb (4.5  kg) for a week, or as told by your health care provider. °· Ask your health care provider when it is okay to: °? Return to work or school. °? Resume usual physical activities or sports. °· Do not drive for 24 hours if you were given a medicine to help you relax (sedative). °· Take over-the-counter and prescription medicines only as told by your health care provider. °· Wear a medical alert bracelet in case of an emergency. This will tell any health care providers that you have a port. °· Keep all follow-up visits as told by your health care provider. This is important. °Contact a health care provider if: °· You cannot flush your port with saline as directed, or you cannot draw blood from the port. °· You have a fever or chills. °· You have more redness, swelling, or pain around your port insertion site. °· You have more fluid or blood coming from your port insertion site. °· Your port insertion site feels warm to the touch. °· You have pus or a bad smell coming from the port insertion site. °Get help right away if: °· You have chest pain or shortness of breath. °· You have bleeding from your port that you cannot control. °Summary °· Take care of the port as told by your health care provider. °· Change your dressing as told by your health care provider. °· Keep all follow-up visits as told by your health care provider. °  This information is not intended to replace advice given to you by your health care provider. Make sure you discuss any questions you have with your health care provider. Document Released: 06/20/2013 Document Revised: 07/21/2016 Document Reviewed: 07/21/2016 Elsevier Interactive Patient Education  2017 [place] Insertion Implanted port insertion is a procedure to put in a port and catheter. The port is a device with an injectable disk that can be accessed by your health care provider. The port is connected to a vein in the chest or neck by a small flexible tube (catheter).  There are different types of ports. The implanted port may be used as a long-term IV access for:  Medicines, such as chemotherapy.  Fluids.  Liquid nutrition, such as total parenteral nutrition (TPN).  Blood samples.  Having a port means that your health care provider will not need to use the veins in your arms for these procedures. Tell a health care provider about:  Any allergies you have.  All medicines you are taking, especially blood thinners, as well as any vitamins, herbs, eye drops, creams, over-the-counter medicines, and steroids.  Any problems you or family members have had with anesthetic medicines.  Any blood disorders you have.  Any surgeries you have had.  Any medical conditions you have, including diabetes or kidney problems.  Whether you are pregnant or may be pregnant. What are the risks? Generally, this is a safe procedure. However, problems may occur, including:  Allergic reactions to medicines or dyes.  Damage to other structures or organs.  Infection.  Damage to the blood vessel, bruising, or bleeding at the puncture site.  Blood clot.  Breakdown of the skin over the port.  A collection of air in the chest that can cause one of the lungs to collapse (pneumothorax). This is rare.  What happens before the procedure? Staying hydrated Follow instructions from your health care provider about hydration, which may include:  Up to 2 hours before the procedure - you may continue to drink clear liquids, such as water, clear fruit juice, black coffee, and plain tea.  Eating and drinking restrictions  Follow instructions from your health care provider about eating and drinking, which may include: ? 8 hours before the procedure - stop eating heavy meals or foods such as meat, fried foods, or fatty foods. ? 6 hours before the procedure - stop eating light meals or foods, such as toast or cereal. ? 6 hours before the procedure - stop drinking milk or drinks  that contain milk. ? 2 hours before the procedure - stop drinking clear liquids. Medicines  Ask your health care provider about: ? Changing or stopping your regular medicines. This is especially important if you are taking diabetes medicines or blood thinners. ? Taking medicines such as aspirin and ibuprofen. These medicines can thin your blood. Do not take these medicines before your procedure if your health care provider instructs you not to.  You may be given antibiotic medicine to help prevent infection. General instructions  Plan to have someone take you home from the hospital or clinic.  If you will be going home right after the procedure, plan to have someone with you for 24 hours.  You may have blood tests.  You may be asked to shower with a germ-killing soap. What happens during the procedure?  To lower your risk of infection: ? Your health care team will wash or sanitize their hands. ? Your skin will be washed with soap. ? Hair  may be removed from the surgical area.  An IV tube will be inserted into one of your veins.  You will be given one or more of the following: ? A medicine to help you relax (sedative). ? A medicine to numb the area (local anesthetic).  Two small cuts (incisions) will be made to insert the port. ? One incision will be made in your neck to get access to the vein where the catheter will lie. ? The other incision will be made in the upper chest. This is where the port will lie.  The procedure may be done using continuous X-ray (fluoroscopy) or other imaging tools for guidance.  The port and catheter will be placed. There may be a small, raised area where the port is.  The port will be flushed with a salt solution (saline), and blood will be drawn to make sure that it is working correctly.  The incisions will be closed.  Bandages (dressings) may be placed over the incisions. The procedure may vary among health care providers and hospitals. What  happens after the procedure?  Your blood pressure, heart rate, breathing rate, and blood oxygen level will be monitored until the medicines you were given have worn off.  Do not drive for 24 hours if you were given a sedative.  You will be given a manufacturer's information card for the type of port that you have. Keep this with you.  Your port will need to be flushed and checked as told by your health care provider, usually every few weeks.  A chest X-ray will be done to: ? Check the placement of the port. ? Make sure there is no injury to your lung. Summary  Implanted port insertion is a procedure to put in a port and catheter.  The implanted port is used as a long-term IV access.  The port will need to be flushed and checked as told by your health care provider, usually every few weeks.  Keep your manufacturer's information card with you at all times. This information is not intended to replace advice given to you by your health care provider. Make sure you discuss any questions you have with your health care provider. Document Released: 06/20/2013 Document Revised: 07/21/2016 Document Reviewed: 07/21/2016 Elsevier Interactive Patient Education  2017 [place]. Moderate Conscious Sedation, Adult, Care After These instructions provide you with information about caring for yourself after your procedure. Your health care provider may also give you more specific instructions. Your treatment has been planned according to current medical practices, but problems sometimes occur. Call your health care provider if you have any problems or questions after your procedure. What can I expect after the procedure? After your procedure, it is common:  To feel sleepy for several hours.  To feel clumsy and have poor balance for several hours.  To have poor judgment for several hours.  To vomit if you eat too soon.  Follow these instructions at home: For at least 24 hours after the  procedure:   Do not: ? Participate in activities where you could fall or become injured. ? Drive. ? Use heavy machinery. ? Drink alcohol. ? Take sleeping pills or medicines that cause drowsiness. ? Make important decisions or sign legal documents. ? Take care of children on your own.  Rest. Eating and drinking  Follow the diet recommended by your health care provider.  If you vomit: ? Drink water, juice, or soup when you can drink without vomiting. ? Make sure you  have little or no nausea before eating solid foods. General instructions  Have a responsible adult stay with you until you are awake and alert.  Take over-the-counter and prescription medicines only as told by your health care provider.  If you smoke, do not smoke without supervision.  Keep all follow-up visits as told by your health care provider. This is important. Contact a health care provider if:  You keep feeling nauseous or you keep vomiting.  You feel light-headed.  You develop a rash.  You have a fever. Get help right away if:  You have trouble breathing. This information is not intended to replace advice given to you by your health care provider. Make sure you discuss any questions you have with your health care provider. Document Released: 06/20/2013 Document Revised: 02/02/2016 Document Reviewed: 12/20/2015 Elsevier Interactive Patient Education  [employer].

## 2017-10-20 ENCOUNTER — Inpatient Hospital Stay: Payer: BLUE CROSS/BLUE SHIELD | Attending: Internal Medicine | Admitting: Internal Medicine

## 2017-10-20 ENCOUNTER — Encounter: Payer: Self-pay | Admitting: Internal Medicine

## 2017-10-20 ENCOUNTER — Telehealth: Payer: Self-pay | Admitting: Internal Medicine

## 2017-10-20 ENCOUNTER — Inpatient Hospital Stay: Payer: BLUE CROSS/BLUE SHIELD

## 2017-10-20 VITALS — BP 124/83 | HR 85 | Temp 98.3°F | Resp 18 | Ht 74.0 in | Wt 208.4 lb

## 2017-10-20 DIAGNOSIS — Z5112 Encounter for antineoplastic immunotherapy: Secondary | ICD-10-CM | POA: Insufficient documentation

## 2017-10-20 DIAGNOSIS — C7951 Secondary malignant neoplasm of bone: Secondary | ICD-10-CM | POA: Insufficient documentation

## 2017-10-20 DIAGNOSIS — C3432 Malignant neoplasm of lower lobe, left bronchus or lung: Secondary | ICD-10-CM

## 2017-10-20 DIAGNOSIS — R5383 Other fatigue: Secondary | ICD-10-CM | POA: Diagnosis not present

## 2017-10-20 DIAGNOSIS — R0789 Other chest pain: Secondary | ICD-10-CM | POA: Insufficient documentation

## 2017-10-20 DIAGNOSIS — Z5111 Encounter for antineoplastic chemotherapy: Secondary | ICD-10-CM | POA: Insufficient documentation

## 2017-10-20 DIAGNOSIS — R1031 Right lower quadrant pain: Secondary | ICD-10-CM

## 2017-10-20 DIAGNOSIS — Z5189 Encounter for other specified aftercare: Secondary | ICD-10-CM | POA: Insufficient documentation

## 2017-10-20 DIAGNOSIS — C7931 Secondary malignant neoplasm of brain: Secondary | ICD-10-CM | POA: Insufficient documentation

## 2017-10-20 DIAGNOSIS — C3491 Malignant neoplasm of unspecified part of right bronchus or lung: Secondary | ICD-10-CM

## 2017-10-20 DIAGNOSIS — I809 Phlebitis and thrombophlebitis of unspecified site: Secondary | ICD-10-CM

## 2017-10-20 DIAGNOSIS — C787 Secondary malignant neoplasm of liver and intrahepatic bile duct: Secondary | ICD-10-CM | POA: Diagnosis not present

## 2017-10-20 DIAGNOSIS — M545 Low back pain: Secondary | ICD-10-CM | POA: Diagnosis not present

## 2017-10-20 DIAGNOSIS — R0602 Shortness of breath: Secondary | ICD-10-CM | POA: Diagnosis not present

## 2017-10-20 DIAGNOSIS — T801XXA Vascular complications following infusion, transfusion and therapeutic injection, initial encounter: Secondary | ICD-10-CM

## 2017-10-20 LAB — CMP (CANCER CENTER ONLY)
ALT: 44 U/L (ref 0–55)
AST: 60 U/L — AB (ref 5–34)
Albumin: 3.1 g/dL — ABNORMAL LOW (ref 3.5–5.0)
Alkaline Phosphatase: 919 U/L — ABNORMAL HIGH (ref 40–150)
Anion gap: 11 (ref 3–11)
BUN: 14 mg/dL (ref 7–26)
CHLORIDE: 109 mmol/L (ref 98–109)
CO2: 21 mmol/L — AB (ref 22–29)
CREATININE: 1.19 mg/dL (ref 0.70–1.30)
Calcium: 8.9 mg/dL (ref 8.4–10.4)
GFR, Est AFR Am: >60 mL/min (ref >60)
GFR, Estimated: >60 mL/min (ref >60)
Glucose, Bld: 193 mg/dL — ABNORMAL HIGH (ref 70–140)
POTASSIUM: 4.2 mmol/L (ref 3.5–5.1)
SODIUM: 141 mmol/L (ref 136–145)
Total Bilirubin: 0.5 mg/dL (ref 0.2–1.2)
Total Protein: 6.3 g/dL — ABNORMAL LOW (ref 6.4–8.3)

## 2017-10-20 LAB — CBC WITH DIFFERENTIAL (CANCER CENTER ONLY)
BASOS ABS: 0.1 10*3/uL (ref 0.0–0.1)
BASOS PCT: 1 %
EOS ABS: 0 10*3/uL (ref 0.0–0.5)
EOS PCT: 0 %
HCT: 31.3 % — ABNORMAL LOW (ref 38.4–49.9)
Hemoglobin: 10.4 g/dL — ABNORMAL LOW (ref 13.0–17.1)
Lymphocytes Relative: 4 %
Lymphs Abs: 0.4 10*3/uL — ABNORMAL LOW (ref 0.9–3.3)
MCH: 29.5 pg (ref 27.2–33.4)
MCHC: 33.2 g/dL (ref 32.0–36.0)
MCV: 88.7 fL (ref 79.3–98.0)
Monocytes Absolute: 0.5 10*3/uL (ref 0.1–0.9)
Monocytes Relative: 4 %
Neutro Abs: 11.5 10*3/uL — ABNORMAL HIGH (ref 1.5–6.5)
Neutrophils Relative %: 91 %
PLATELETS: 177 10*3/uL (ref 140–400)
RBC: 3.53 MIL/uL — AB (ref 4.20–5.82)
RDW: 15.8 % — ABNORMAL HIGH (ref 11.0–14.6)
WBC: 12.5 10*3/uL — AB (ref 4.0–10.3)

## 2017-10-20 MED ORDER — HEPARIN SOD (PORK) LOCK FLUSH 100 UNIT/ML IV SOLN
500.0000 [IU] | Freq: Once | INTRAVENOUS | Status: AC | PRN
  Administered 2017-10-20: 500 [IU]
  Filled 2017-10-20: qty 5

## 2017-10-20 MED ORDER — CYANOCOBALAMIN 1000 MCG/ML IJ SOLN
INTRAMUSCULAR | Status: AC
  Filled 2017-10-20: qty 1

## 2017-10-20 MED ORDER — SODIUM CHLORIDE 0.9 % IV SOLN
Freq: Once | INTRAVENOUS | Status: AC
  Administered 2017-10-20: 12:00:00 via INTRAVENOUS
  Filled 2017-10-20: qty 5

## 2017-10-20 MED ORDER — CARBOPLATIN CHEMO INJECTION 600 MG/60ML
643.0000 mg | Freq: Once | INTRAVENOUS | Status: AC
  Administered 2017-10-20: 640 mg via INTRAVENOUS
  Filled 2017-10-20: qty 64

## 2017-10-20 MED ORDER — LIDOCAINE-PRILOCAINE 2.5-2.5 % EX CREA
TOPICAL_CREAM | CUTANEOUS | Status: AC
  Filled 2017-10-20: qty 5

## 2017-10-20 MED ORDER — SODIUM CHLORIDE 0.9 % IV SOLN
Freq: Once | INTRAVENOUS | Status: AC
  Administered 2017-10-20: 11:00:00 via INTRAVENOUS

## 2017-10-20 MED ORDER — PALONOSETRON HCL INJECTION 0.25 MG/5ML
INTRAVENOUS | Status: AC
  Filled 2017-10-20: qty 5

## 2017-10-20 MED ORDER — CYANOCOBALAMIN 1000 MCG/ML IJ SOLN
1000.0000 ug | Freq: Once | INTRAMUSCULAR | Status: AC
  Administered 2017-10-20: 1000 ug via INTRAMUSCULAR

## 2017-10-20 MED ORDER — SODIUM CHLORIDE 0.9 % IV SOLN
1100.0000 mg | Freq: Once | INTRAVENOUS | Status: AC
  Administered 2017-10-20: 1100 mg via INTRAVENOUS
  Filled 2017-10-20: qty 40

## 2017-10-20 MED ORDER — PALONOSETRON HCL INJECTION 0.25 MG/5ML
0.2500 mg | Freq: Once | INTRAVENOUS | Status: AC
  Administered 2017-10-20: 0.25 mg via INTRAVENOUS

## 2017-10-20 MED ORDER — SODIUM CHLORIDE 0.9% FLUSH
10.0000 mL | INTRAVENOUS | Status: DC | PRN
  Administered 2017-10-20: 10 mL
  Filled 2017-10-20: qty 10

## 2017-10-20 MED ORDER — SODIUM CHLORIDE 0.9 % IV SOLN
200.0000 mg | Freq: Once | INTRAVENOUS | Status: AC
  Administered 2017-10-20: 200 mg via INTRAVENOUS
  Filled 2017-10-20: qty 8

## 2017-10-20 NOTE — Telephone Encounter (Signed)
Per 2/7 los already scheduled

## 2017-10-20 NOTE — Progress Notes (Signed)
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  He had disease progression and development of EGFR T790M resistant mutation in April 2017. 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  PRIOR THERAPY: 1) Status post whole brain irradiation under the care Dr. Tammi Klippel completed 04/12/2014. 2) Gilotrif 40 mg po daily - therapy beginning 04/03/2014. Status post approximately 20 months of therapy discontinued secondary to disease progression and development of EGFR T790M resistant mutation.  3) palliative radiotherapy to the lytic lesion in the right clavicle. 4) Tagrisso 80 mg by mouth daily status post 19 months of treatment. First dose was given on 01/12/2016.  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  CURRENT THERAPY: 1) systemic chemotherapy with carboplatin for AC of 5, Alimta 500 mg/M2 and Keytruda 200 mg IV every 3 weeks.  First dose September 09, 2017.  Status post 1 cycle. 2) Xgeva 120 g subcutaneously on monthly basis.  INTERVAL HISTORY: Oscar Floyd 52 y.o. male returns to the clinic today for follow-up visit.  The patient tolerated the last cycle of his treatment fairly well except for increasing fatigue.  He is complaining of increasing shortness of breath even with mild exertion.  He denied having any significant chest pain but has pain in the lower back towards the right.  He denied having any current fever or chills.  He has no nausea, vomiting, diarrhea or  constipation but has occasional right lower quadrant pain.  He denied having any significant weight loss or night sweats.  He is here today for evaluation before starting cycle #3 of his treatment.   MEDICAL HISTORY: Past Medical History:  Diagnosis Date  . Bone metastases ([place]) 08/26/2015  . Encounter for antineoplastic chemotherapy 01/06/2016  . Family history of cancer   . Hypertension   . Lung cancer ([place])    RUL lung with mets to liver, bone and brain    ALLERGIES:  has No Known Allergies.  MEDICATIONS:  Current Outpatient Medications  Medication Sig Dispense Refill  . albuterol (PROVENTIL HFA;VENTOLIN HFA) 108 (90 Base) MCG/ACT inhaler Inhale 2 puffs into the lungs every 6 (six) hours as needed for wheezing or shortness of breath. 1 Inhaler 6  . ALPRAZolam (XANAX) 1 MG tablet Take 0.5-1 mg by mouth.    . dexamethasone (DECADRON) 4 MG tablet 4 mg p.o. twice daily the day before, day of and day after chemotherapy every 3 weeks 40 tablet 1  . diazepam (VALIUM) 10 MG tablet Take 10 mg by mouth.    . docusate sodium (COLACE) 100 MG capsule Take 100 mg by mouth daily.    Marland Kitchen esomeprazole (NEXIUM) 40 MG capsule Take 1 capsule (40 mg total) by mouth daily at 12 noon. 30 capsule 5  . Fluticasone Propionate, Inhal, 100 MCG/BLIST AEPB Inhale 2 puffs into the lungs 2 (two) times daily. 60 each 5  . folic acid (FOLVITE) 1 MG tablet Take 1 tablet (1 mg total) by mouth daily. 30 tablet 4  . HYDROcodone-acetaminophen (NORCO/VICODIN) 5-325 MG tablet Take 1 tablet  by mouth every 6 (six) hours as needed for moderate pain. 30 tablet 0  . Hydrocodone-Homatropine 5-1.5 MG TABS Take 1 tablet by mouth 4 (four) times daily as needed. 60 each 0  . ibuprofen (ADVIL,MOTRIN) 800 MG tablet Take by mouth.    . levofloxacin (LEVAQUIN) 500 MG tablet Take 1 tablet (500 mg total) by mouth daily. 7 tablet 0  . MELATONIN PO Take 20 mg by mouth.    . mirtazapine (REMERON) 30 MG tablet Take 1 tablet (30 mg total) by  mouth at bedtime. 30 tablet 2  . naproxen sodium (ANAPROX) 220 MG tablet Take 220 mg 2 (two) times daily as needed by mouth.     . prochlorperazine (COMPAZINE) 10 MG tablet Take 1 tablet (10 mg total) by mouth every 6 (six) hours as needed for nausea or vomiting. 30 tablet 0  . enoxaparin (LOVENOX) 100 MG/ML injection Inject 0.95 mLs (95 mg total) into the skin daily for 7 days. 7 Syringe 0   No current facility-administered medications for this visit.     SURGICAL HISTORY:  Past Surgical History:  Procedure Laterality Date  . IR FLUORO GUIDE PORT INSERTION RIGHT  10/19/2017  . IR US GUIDE VASC ACCESS RIGHT  10/19/2017  . VASECTOMY    . VIDEO BRONCHOSCOPY Bilateral 03/20/2014   Procedure: VIDEO BRONCHOSCOPY WITH FLUORO;  Surgeon: Rigoberto Noel, MD;  Location: [facility];  Service: Cardiopulmonary;  Laterality: Bilateral;  . VIDEO BRONCHOSCOPY Bilateral 07/20/2017   Procedure: VIDEO BRONCHOSCOPY WITH FLUORO;  Surgeon: Rigoberto Noel, MD;  Location: [facility];  Service: Cardiopulmonary;  Laterality: Bilateral;    REVIEW OF SYSTEMS:  Constitutional: positive for fatigue Eyes: negative Ears, nose, mouth, throat, and face: negative Respiratory: positive for cough and dyspnea on exertion Cardiovascular: negative Gastrointestinal: positive for abdominal pain Genitourinary:negative Integument/breast: negative Hematologic/lymphatic: negative Musculoskeletal:positive for back pain and bone pain Neurological: negative Behavioral/Psych: negative Endocrine: negative Allergic/Immunologic: negative   PHYSICAL EXAMINATION: General appearance: alert, cooperative, appears stated age, fatigued and no distress Head: Normocephalic, without obvious abnormality, atraumatic Neck: no adenopathy, no JVD, supple, symmetrical, trachea midline and thyroid not enlarged, symmetric, no tenderness/mass/nodules Lymph nodes: Cervical, supraclavicular, and axillary nodes normal. Resp: clear to auscultation  bilaterally Back: symmetric, no curvature. ROM normal. No CVA tenderness. Cardio: regular rate and rhythm, S1, S2 normal, no murmur, click, rub or gallop GI: soft, non-tender; bowel sounds normal; no masses,  no organomegaly Extremities: extremities normal, atraumatic, no cyanosis or edema Neurologic: Alert and oriented X 3, normal strength and tone. Normal symmetric reflexes. Normal coordination and gait  ECOG PERFORMANCE STATUS: 1 - Symptomatic but completely ambulatory  Blood pressure 124/83, pulse 85, temperature 98.3 F (36.8 C), temperature source Oral, resp. rate 18, height 6' 2"  (1.88 m), weight 208 lb 6.4 oz (94.5 kg), SpO2 98 %.  LABORATORY DATA: Lab Results  Component Value Date   WBC 12.5 (H) 10/20/2017   HGB 10.3 (L) 10/19/2017   HCT 31.3 (L) 10/20/2017   MCV 88.7 10/20/2017   PLT 177 10/20/2017      Chemistry      Component Value Date/Time   NA 141 10/20/2017 0933   NA 141 08/31/2017 0841   K 4.2 10/20/2017 0933   K 4.4 08/31/2017 0841   CL 109 10/20/2017 0933   CO2 21 (L) 10/20/2017 0933   CO2 26 08/31/2017 0841   BUN 14 10/20/2017 0933   BUN 14.7 08/31/2017 0841   CREATININE 1.19 10/20/2017 0933   CREATININE 1.2 08/31/2017  2536      Component Value Date/Time   CALCIUM 8.9 10/20/2017 0933   CALCIUM 8.8 08/31/2017 0841   ALKPHOS 919 (H) 10/20/2017 0933   ALKPHOS 350 (H) 08/31/2017 0841   AST 60 (H) 10/20/2017 0933   AST 31 08/31/2017 0841   ALT 44 10/20/2017 0933   ALT 17 08/31/2017 0841   BILITOT 0.5 10/20/2017 0933   BILITOT 0.50 08/31/2017 0841       RADIOGRAPHIC STUDIES:   ASSESSMENT AND PLAN: This is a very pleasant 52 years old white male with metastatic non-small cell lung cancer, adenocarcinoma with positive EGFR mutation with deletion in the 19 status post treatment with Gilotrif for 20 months discontinued secondary to disease progression and development of EGFR T790M resistant mutation. The patient is currently on treatment with  Tagrisso 80 mg by mouth daily status post 19 months. The patient has been tolerating this treatment fairly well but unfortunately has evidence for disease progression in the left lower lobe that was biopsy-proven to be recurrent non-small cell lung cancer adenocarcinoma. The molecular studies showed development of new resistant EGFR mutation C797S. The patient is currently on systemic chemotherapy with carboplatin, Alimta and Keytruda status post 2 cycles.   He continues to tolerate the treatment well except for increasing fatigue.  I recommended for him to proceed with cycle #3 today as a scheduled. The patient also complained of significant shortness of breath that was getting worse over the last few weeks.  For the shortness of breath, I would order repeat CT scan of the chest for reevaluation of his condition and to rule out any other abnormalities or disease progression. For the abdominal pain as well as low back pain, I will order CT scan of the abdomen and pelvis to rule out any progression of his disease or any other explanation for his persistent back pain and elevated alkaline phosphatase. The patient will come back for follow-up visit in 3 weeks for evaluation before the next cycle of his treatment. He was also referred to Dr. Durenda Hurt at [facility] for a second opinion and the availability of a clinical trial for the resistant EGFR mutation C797S. He was advised to call immediately if he has any concerning symptoms in the interval. The patient voices understanding of current disease status and treatment options and is in agreement with the current care plan. All questions were answered. The patient knows to call the clinic with any problems, questions or concerns. We can certainly see the patient much sooner if necessary.  Disclaimer: This note was dictated with voice recognition software. Similar sounding words can inadvertently be transcribed and may not be corrected upon  review.

## 2017-10-20 NOTE — Progress Notes (Signed)
These preliminary result these preliminary results were noted.  Awaiting final report.

## 2017-10-20 NOTE — Patient Instructions (Signed)
[facility] Discharge Instructions for Patients Receiving Chemotherapy  Today you received the following chemotherapy agents Keytruda, Alimta, and Carboplatin  To help prevent nausea and vomiting after your treatment, we encourage you to take your nausea medication as directed.  If you develop nausea and vomiting that is not controlled by your nausea medication, call the clinic.   BELOW ARE SYMPTOMS THAT SHOULD BE REPORTED IMMEDIATELY:  *FEVER GREATER THAN 100.5 F  *CHILLS WITH OR WITHOUT FEVER  NAUSEA AND VOMITING THAT IS NOT CONTROLLED WITH YOUR NAUSEA MEDICATION  *UNUSUAL SHORTNESS OF BREATH  *UNUSUAL BRUISING OR BLEEDING  TENDERNESS IN MOUTH AND THROAT WITH OR WITHOUT PRESENCE OF ULCERS  *URINARY PROBLEMS  *BOWEL PROBLEMS  UNUSUAL RASH Items with * indicate a potential emergency and should be followed up as soon as possible.  Feel free to call the clinic should you have any questions or concerns. The clinic phone number is [phone].  Please show the CHEMO ALERT CARD at check-in to the Emergency Department and triage nurse.   

## 2017-10-20 NOTE — Telephone Encounter (Signed)
Patient notified of appointment for injection per 2/7 schd msg

## 2017-10-21 ENCOUNTER — Inpatient Hospital Stay (HOSPITAL_BASED_OUTPATIENT_CLINIC_OR_DEPARTMENT_OTHER): Payer: BLUE CROSS/BLUE SHIELD | Admitting: Medical

## 2017-10-21 ENCOUNTER — Ambulatory Visit (HOSPITAL_COMMUNITY)
Admission: RE | Admit: 2017-10-21 | Discharge: 2017-10-21 | Disposition: A | Discharge status: Home / Self Care | Payer: BLUE CROSS/BLUE SHIELD | Source: Ambulatory Visit | Attending: Internal Medicine | Admitting: Internal Medicine

## 2017-10-21 ENCOUNTER — Telehealth: Payer: Self-pay | Admitting: *Deleted

## 2017-10-21 ENCOUNTER — Telehealth: Payer: Self-pay | Admitting: Medical Oncology

## 2017-10-21 VITALS — BP 133/77 | HR 64 | Temp 97.5°F | Resp 18 | Ht 74.0 in | Wt 211.0 lb

## 2017-10-21 DIAGNOSIS — C7951 Secondary malignant neoplasm of bone: Secondary | ICD-10-CM | POA: Insufficient documentation

## 2017-10-21 DIAGNOSIS — C3491 Malignant neoplasm of unspecified part of right bronchus or lung: Secondary | ICD-10-CM | POA: Diagnosis present

## 2017-10-21 DIAGNOSIS — T148XXA Other injury of unspecified body region, initial encounter: Secondary | ICD-10-CM

## 2017-10-21 DIAGNOSIS — R0602 Shortness of breath: Secondary | ICD-10-CM | POA: Diagnosis not present

## 2017-10-21 MED ORDER — IOPAMIDOL (ISOVUE-300) INJECTION 61%
30.0000 mL | Freq: Once | INTRAVENOUS | Status: AC | PRN
  Administered 2017-10-21: 30 mL via ORAL

## 2017-10-21 MED ORDER — SODIUM CHLORIDE 0.9 % IJ SOLN
INTRAMUSCULAR | Status: AC
  Filled 2017-10-21: qty 50

## 2017-10-21 MED ORDER — IOPAMIDOL (ISOVUE-300) INJECTION 61%
INTRAVENOUS | Status: AC
  Filled 2017-10-21: qty 100

## 2017-10-21 MED ORDER — IOPAMIDOL (ISOVUE-300) INJECTION 61%
INTRAVENOUS | Status: AC
  Filled 2017-10-21: qty 30

## 2017-10-21 MED ORDER — IOPAMIDOL (ISOVUE-300) INJECTION 61%
100.0000 mL | Freq: Once | INTRAVENOUS | Status: AC | PRN
  Administered 2017-10-21: 100 mL via INTRAVENOUS

## 2017-10-21 NOTE — Telephone Encounter (Signed)
Tired to call pt back. He reports discoloration along his arm. Seeing [facility] today.

## 2017-10-21 NOTE — Progress Notes (Signed)
Pt called, asking to be seen in [facility] as he noticed bruising on his left hand with 'black streaks". Examined hand. Pt had an IV in his left hand, he thinks during port insertion.  Advised him that since he had been on lovenox recently (ended on 10/16/17) that this bruising is not unexpected.  The bruising appears to be resolving. Denies pain or discomfort. No swelling or heat at the site. Black streaks were simply residual adhesive from the dressing on this IV site. The black streaks were easily removed with adhesive remover.  Pt felt reassured.  No other interventions needed.  RN visit only

## 2017-10-22 ENCOUNTER — Inpatient Hospital Stay: Payer: BLUE CROSS/BLUE SHIELD

## 2017-10-22 VITALS — BP 111/70 | HR 68 | Temp 97.9°F | Resp 16

## 2017-10-22 DIAGNOSIS — C3491 Malignant neoplasm of unspecified part of right bronchus or lung: Secondary | ICD-10-CM

## 2017-10-22 DIAGNOSIS — C3432 Malignant neoplasm of lower lobe, left bronchus or lung: Secondary | ICD-10-CM | POA: Diagnosis not present

## 2017-10-22 MED ORDER — PEGFILGRASTIM INJECTION 6 MG/0.6ML ~~LOC~~
PREFILLED_SYRINGE | SUBCUTANEOUS | Status: AC
  Filled 2017-10-22: qty 0.6

## 2017-10-22 MED ORDER — PEGFILGRASTIM-CBQV 6 MG/0.6ML ~~LOC~~ SOSY
6.0000 mg | PREFILLED_SYRINGE | Freq: Once | SUBCUTANEOUS | Status: AC
  Administered 2017-10-22: 6 mg via SUBCUTANEOUS

## 2017-10-22 NOTE — Patient Instructions (Signed)
Pegfilgrastim injection What is this medicine? PEGFILGRASTIM (PEG fil gra stim) is a long-acting granulocyte colony-stimulating factor that stimulates the growth of neutrophils, a type of white blood cell important in the body's fight against infection. It is used to reduce the incidence of fever and infection in patients with certain types of cancer who are receiving chemotherapy that affects the bone marrow, and to increase survival after being exposed to high doses of radiation. This medicine may be used for other purposes; ask your health care provider or pharmacist if you have questions. COMMON BRAND NAME(S): Neulasta What should I tell my health care provider before I take this medicine? They need to know if you have any of these conditions: -kidney disease -latex allergy -ongoing radiation therapy -sickle cell disease -skin reactions to acrylic adhesives (On-Body Injector only) -an unusual or allergic reaction to pegfilgrastim, filgrastim, other medicines, foods, dyes, or preservatives -pregnant or trying to get pregnant -breast-feeding How should I use this medicine? This medicine is for injection under the skin. If you get this medicine at home, you will be taught how to prepare and give the pre-filled syringe or how to use the On-body Injector. Refer to the patient Instructions for Use for detailed instructions. Use exactly as directed. Tell your healthcare provider immediately if you suspect that the On-body Injector may not have performed as intended or if you suspect the use of the On-body Injector resulted in a missed or partial dose. It is important that you put your used needles and syringes in a special sharps container. Do not put them in a trash can. If you do not have a sharps container, call your pharmacist or healthcare provider to get one. Talk to your pediatrician regarding the use of this medicine in children. While this drug may be prescribed for selected conditions,  precautions do apply. Overdosage: If you think you have taken too much of this medicine contact a poison control center or emergency room at once. NOTE: This medicine is only for you. Do not share this medicine with others. What if I miss a dose? It is important not to miss your dose. Call your doctor or health care professional if you miss your dose. If you miss a dose due to an On-body Injector failure or leakage, a new dose should be administered as soon as possible using a single prefilled syringe for manual use. What may interact with this medicine? Interactions have not been studied. Give your health care provider a list of all the medicines, herbs, non-prescription drugs, or dietary supplements you use. Also tell them if you smoke, drink alcohol, or use illegal drugs. Some items may interact with your medicine. This list may not describe all possible interactions. Give your health care provider a list of all the medicines, herbs, non-prescription drugs, or dietary supplements you use. Also tell them if you smoke, drink alcohol, or use illegal drugs. Some items may interact with your medicine. What should I watch for while using this medicine? You may need blood work done while you are taking this medicine. If you are going to need a MRI, CT scan, or other procedure, tell your doctor that you are using this medicine (On-Body Injector only). What side effects may I notice from receiving this medicine? Side effects that you should report to your doctor or health care professional as soon as possible: -allergic reactions like skin rash, itching or hives, swelling of the face, lips, or tongue -dizziness -fever -pain, redness, or irritation at site   where injected -pinpoint red spots on the skin -red or dark-brown urine -shortness of breath or breathing problems -stomach or side pain, or pain at the shoulder -swelling -tiredness -trouble passing urine or change in the amount of urine Side  effects that usually do not require medical attention (report to your doctor or health care professional if they continue or are bothersome): -bone pain -muscle pain This list may not describe all possible side effects. Call your doctor for medical advice about side effects. You may report side effects to FDA at 1-800-FDA-1088. Where should I keep my medicine? Keep out of the reach of children. Store pre-filled syringes in a refrigerator between 2 and 8 degrees C (36 and 46 degrees F). Do not freeze. Keep in carton to protect from light. Throw away this medicine if it is left out of the refrigerator for more than 48 hours. Throw away any unused medicine after the expiration date. NOTE: This sheet is a summary. It may not cover all possible information. If you have questions about this medicine, talk to your doctor, pharmacist, or health care provider.  2018 Elsevier/Gold Standard (2016-08-26 12:58:03)  

## 2017-10-24 ENCOUNTER — Telehealth: Payer: Self-pay | Admitting: *Deleted

## 2017-10-24 NOTE — Telephone Encounter (Signed)
Oncology Nurse Navigator Documentation  Oncology Nurse Navigator Flowsheets 10/24/2017  Navigator Location [facility]  Navigator Encounter Type Telephone/Mr. Kotowski called me and left vm message to call. I updated Dr. Julien Nordmann. He stated to update patient on his ct results. I did and educated him.  Patient was thankful for the call.   Telephone Incoming Call;Outgoing Call  Treatment Phase Treatment  Barriers/Navigation Needs Education  Education Other  Interventions Education  Coordination of Care Other  Education Method Verbal  Acuity Level 2  Time Spent with Patient 30

## 2017-10-26 ENCOUNTER — Encounter: Payer: Self-pay | Admitting: *Deleted

## 2017-10-26 ENCOUNTER — Inpatient Hospital Stay: Payer: BLUE CROSS/BLUE SHIELD

## 2017-10-26 ENCOUNTER — Inpatient Hospital Stay (HOSPITAL_BASED_OUTPATIENT_CLINIC_OR_DEPARTMENT_OTHER): Payer: BLUE CROSS/BLUE SHIELD | Admitting: Medical

## 2017-10-26 VITALS — BP 118/71 | HR 98 | Temp 98.0°F | Resp 28 | Ht 74.0 in | Wt 202.5 lb

## 2017-10-26 DIAGNOSIS — R0602 Shortness of breath: Secondary | ICD-10-CM

## 2017-10-26 DIAGNOSIS — C3491 Malignant neoplasm of unspecified part of right bronchus or lung: Secondary | ICD-10-CM

## 2017-10-26 DIAGNOSIS — C3432 Malignant neoplasm of lower lobe, left bronchus or lung: Secondary | ICD-10-CM | POA: Diagnosis not present

## 2017-10-26 DIAGNOSIS — C3411 Malignant neoplasm of upper lobe, right bronchus or lung: Secondary | ICD-10-CM | POA: Diagnosis not present

## 2017-10-26 DIAGNOSIS — C787 Secondary malignant neoplasm of liver and intrahepatic bile duct: Secondary | ICD-10-CM

## 2017-10-26 DIAGNOSIS — R0789 Other chest pain: Secondary | ICD-10-CM

## 2017-10-26 DIAGNOSIS — C7951 Secondary malignant neoplasm of bone: Secondary | ICD-10-CM

## 2017-10-26 DIAGNOSIS — C7931 Secondary malignant neoplasm of brain: Secondary | ICD-10-CM | POA: Diagnosis not present

## 2017-10-26 LAB — CMP (CANCER CENTER ONLY)
ALBUMIN: 3.3 g/dL — AB (ref 3.5–5.0)
ALK PHOS: 885 U/L — AB (ref 40–150)
ALT: 45 U/L (ref 0–55)
ANION GAP: 12 — AB (ref 3–11)
AST: 43 U/L — ABNORMAL HIGH (ref 5–34)
BUN: 17 mg/dL (ref 7–26)
CHLORIDE: 103 mmol/L (ref 98–109)
CO2: 22 mmol/L (ref 22–29)
Calcium: 7.8 mg/dL — ABNORMAL LOW (ref 8.4–10.4)
Creatinine: 1.08 mg/dL (ref 0.70–1.30)
GFR, Est AFR Am: >60 mL/min (ref >60)
GFR, Estimated: >60 mL/min (ref >60)
GLUCOSE: 124 mg/dL (ref 70–140)
POTASSIUM: 3.9 mmol/L (ref 3.5–5.1)
SODIUM: 137 mmol/L (ref 136–145)
Total Bilirubin: 0.9 mg/dL (ref 0.2–1.2)
Total Protein: 6.4 g/dL (ref 6.4–8.3)

## 2017-10-26 LAB — CBC WITH DIFFERENTIAL (CANCER CENTER ONLY)
BASOS PCT: 0 %
Basophils Absolute: 0 10*3/uL (ref 0.0–0.1)
Eosinophils Absolute: 0.1 10*3/uL (ref 0.0–0.5)
Eosinophils Relative: 2 %
HCT: 31 % — ABNORMAL LOW (ref 38.4–49.9)
HEMOGLOBIN: 10.6 g/dL — AB (ref 13.0–17.1)
Lymphocytes Relative: 15 %
Lymphs Abs: 0.6 10*3/uL — ABNORMAL LOW (ref 0.9–3.3)
MCH: 29.4 pg (ref 27.2–33.4)
MCHC: 34.2 g/dL (ref 32.0–36.0)
MCV: 86.1 fL (ref 79.3–98.0)
MONO ABS: 0.2 10*3/uL (ref 0.1–0.9)
Monocytes Relative: 5 %
NEUTROS ABS: 3.1 10*3/uL (ref 1.5–6.5)
Neutrophils Relative %: 78 %
Platelet Count: 25 10*3/uL — ABNORMAL LOW (ref 140–400)
RBC: 3.6 MIL/uL — ABNORMAL LOW (ref 4.20–5.82)
RDW: 15.2 % — AB (ref 11.0–14.6)
WBC Count: 4 10*3/uL (ref 4.0–10.3)
nRBC: 0 /100 WBC

## 2017-10-26 MED ORDER — PREDNISONE 10 MG PO TABS: ORAL_TABLET | ORAL | 0 refills | Status: DC

## 2017-10-26 NOTE — Progress Notes (Signed)
Oncology Nurse Navigator Documentation  Oncology Nurse Navigator Flowsheets 10/26/2017  Navigator Location [facility]  Navigator Encounter Type Telephone/patient called with complaints of not feeling well and SOB when walking around. I updated symptom management nurse. Patient will be seen with Lucianne Lei, [place] today for evaluation.   Telephone Outgoing Call;Incoming Call  Treatment Phase Treatment  Barriers/Navigation Needs Coordination of Care  Interventions Coordination of Care  Coordination of Care Appts  Acuity Level 2  Time Spent with Patient 30

## 2017-10-27 ENCOUNTER — Encounter: Payer: Self-pay | Admitting: Medical

## 2017-10-27 ENCOUNTER — Other Ambulatory Visit: Payer: BLUE CROSS/BLUE SHIELD

## 2017-10-27 ENCOUNTER — Encounter: Payer: Self-pay | Admitting: *Deleted

## 2017-10-27 NOTE — Progress Notes (Signed)
Oncology Nurse Navigator Documentation  Oncology Nurse Navigator Flowsheets 10/27/2017  Navigator Location [facility]  Navigator Encounter Type Telephone/I spoke with Oscar Floyd today. They called me on the way home from [facility].  He states the doctor agreed with Dr. Julien Nordmann to continue chemo. The trial the doctor spoke about is closed and may not reopen due to patients having hypertension.  There may be another clinical trial in [place] but that can be for a later time.  Patient will continue on chemo and has appts. I will update Dr. Julien Nordmann.   Telephone Outgoing Call;Incoming Call  Treatment Phase Treatment  Barriers/Navigation Needs Education  Education Other  Interventions Education  Education Method Verbal  Acuity Level 1  Time Spent with Patient 15

## 2017-10-27 NOTE — Progress Notes (Signed)
Symptoms Management Clinic Progress Note   Oscar Floyd [id number] [birth year] 52 y.o.  Oscar Floyd is managed by Dr. Eilleen Kempf  Actively treated with chemotherapy: yes  Current Therapy: carboplatin, Alimta, and Keytruda  Last Treated: 10/20/2017 (cycle 3, day 1)  Assessment: Plan:    Shortness of breath - Plan: predniSONE (DELTASONE) 10 MG tablet  Non-small cell cancer of right lung (HCC)   Shortness of breath: Oscar Floyd was seen with Dr. Julien Nordmann.  Dr. Julien Nordmann expressed his belief that Oscar Floyd's shortness of breath is  related to his disease.  I discussed with Oscar Floyd that he could increase his Flovent to 1-4 puffs twice daily and increase his albuterol to 2 puffs every 4 hours as needed.  Additionally he has been given a prednisone taper which he will begin tomorrow.  Metastatic non-small cell lung cancer: Oscar Floyd will be seen at [facility] tomorrow for a consult to determine if there are other treatment options available.  Dr. Julien Nordmann did relay to Oscar Floyd and his wife that he had spoken with Dr. Tammi Klippel and that radiation to his left lower lung may be an option pending his consult tomorrow.  Dr. Julien Nordmann mentioned that another option may be to go back to one of the chemotherapeutic regimens that were used early in the patient's treatment.  Please see After Visit Summary for patient specific instructions.  Future Appointments  Date Time Provider [place]  11/03/2017  4:00 PM [facility] 1 [facility] None  11/09/2017 10:50 AM [facility] MR 3 [facility]315MRI [facility] [address]  11/10/2017  9:00 AM [facility] LAB 5 [facility] None  11/10/2017  9:30 AM [name], MD [facility] None  11/10/2017 10:30 AM [facility] B5 [facility] None  11/15/2017  3:30 PM Bruning, Ashlyn, PA-C [facility] None  11/17/2017  4:00 PM [facility] 1 [facility] None  11/24/2017  4:00 PM [facility] 1 [facility] None  12/01/2017  9:15 AM [facility] 1 [facility]  None  12/01/2017  9:45 AM [name], MD [facility] None  12/01/2017 10:45 AM [facility] E16 [facility] None    No orders of the defined types were placed in this encounter.      Subjective:   Patient ID:  Oscar Floyd is a 52 y.o. (DOB 03-02-1966) male.  Chief Complaint:  Chief Complaint  Patient presents with  . Shortness of Breath    HPI Oscar Floyd is a 52 year old male with a diagnosis of a stage IV (T2a, N0, M1b) non-small cell lung cancer consistent with a poorly differentiated adenocarcinoma> He was originally diagnosed in July 2015 when he presented with a right upper lobe lung mass, extensive liver, brain, and bone metastasis.  He is status post cycle 3, day 1 of carboplatin, Alimta, and Keytruda which was dosed on 10/20/2017. His last restaging CT scans from 10/21/2017 demonstrated generally stable widespread metastatic disease in the lungs bilaterally and throughout the skeleton.  No definite progression was noted.  There were no hepatic metastasis noted. Oscar Floyd presents to the clinic today with a report of progressive shortness of breath.  Oscar Floyd presents with his wife.  He acknowledges that he is having increasing shortness of breath with activity.  He has noted this when walking or talking.  He is also having increased tightness in his chest.  He is okay when he is sitting.  When he is supine he is okay when he is lying on his right side however his shortness of breath and chest tightness increased  when he is lying on his left side.  He continues to use Flovent and albuterol.  He is using his albuterol inhaler several times a day but has not noted any benefit as yet.  He is being seen at [facility] tomorrow for a consultation to determine if they are new treatment options available.  Medications: I have reviewed the patient's current medications.  Allergies: No Known Allergies  Past Medical History:  Diagnosis Date  . Bone metastases ([place])  08/26/2015  . Encounter for antineoplastic chemotherapy 01/06/2016  . Family history of cancer   . Hypertension   . Lung cancer ([place])    RUL lung with mets to liver, bone and brain    Past Surgical History:  Procedure Laterality Date  . IR FLUORO GUIDE PORT INSERTION RIGHT  10/19/2017  . IR US GUIDE VASC ACCESS RIGHT  10/19/2017  . VASECTOMY    . VIDEO BRONCHOSCOPY Bilateral 03/20/2014   Procedure: VIDEO BRONCHOSCOPY WITH FLUORO;  Surgeon: Rigoberto Noel, MD;  Location: [facility];  Service: Cardiopulmonary;  Laterality: Bilateral;  . VIDEO BRONCHOSCOPY Bilateral 07/20/2017   Procedure: VIDEO BRONCHOSCOPY WITH FLUORO;  Surgeon: Rigoberto Noel, MD;  Location: [facility];  Service: Cardiopulmonary;  Laterality: Bilateral;    Family History  Problem Relation Age of Onset  . Cancer Father 58       liposarcoma age 53  . Cancer Mother 13       gist  . Cancer Paternal Grandfather        lung cancer; heavy smoker; also had abdominal cancer in 30's  . Stroke Maternal Grandmother   . Heart disease Maternal Grandfather   . Cancer Other        multiple brothers of PGF with unknown cancer    Social History   Socioeconomic History  . Marital status: Married    Spouse name: Not on file  . Number of children: 2  . Years of education: Not on file  . Highest education level: Not on file  Social Needs  . Financial resource strain: Not on file  . Food insecurity - worry: Not on file  . Food insecurity - inability: Not on file  . Transportation needs - medical: Not on file  . Transportation needs - non-medical: Not on file  Occupational History  . Not on file  Tobacco Use  . Smoking status: Never Smoker  . Smokeless tobacco: Never Used  Substance and Sexual Activity  . Alcohol use: Yes    Comment: socially  . Drug use: No  . Sexual activity: Yes  Other Topics Concern  . Not on file  Social History Narrative  . Not on file    Past Medical History, Surgical history, Social history,  and Family history were reviewed and updated as appropriate.   Please see review of systems for further details on the patient's review from today.   Review of Systems:  Review of Systems  Constitutional: Positive for activity change. Negative for chills, diaphoresis and fever.  Respiratory: Positive for chest tightness and shortness of breath. Negative for cough and wheezing.     Objective:   Physical Exam:  BP 118/71 (BP Location: Left Arm, Patient Position: Sitting)   Pulse 98   Temp 98 F (36.7 C) (Oral)   Resp (!) 28   Ht 6' 2"  (1.88 m)   Wt 202 lb 8 oz (91.9 kg)   SpO2 98%   BMI 26.00 kg/m  ECOG: 1  Respirations at  rest: 28, pulse at rest: 98, oxygen saturation at rest on room air: 98% Pulse with activity on room air: 125, oxygen saturation with activity on room air: 93%  Physical Exam  Constitutional: No distress.  HENT:  Head: Normocephalic and atraumatic.  Mouth/Throat: Oropharynx is clear and moist. No oropharyngeal exudate.  Cardiovascular: Normal rate, regular rhythm and normal heart sounds. Exam reveals no gallop and no friction rub.  No murmur heard. Pulmonary/Chest:      Mr. Daigle is short of breath with activity but is breathing normally without any evidence of distress while at rest.  Neurological: He is alert. Coordination normal.  Skin: Skin is warm and dry. No rash noted. He is not diaphoretic. No erythema.  Psychiatric: He has a normal mood and affect. His behavior is normal. Judgment and thought content normal.    Lab Review:     Component Value Date/Time   NA 137 10/26/2017 1444   NA 141 08/31/2017 0841   K 3.9 10/26/2017 1444   K 4.4 08/31/2017 0841   CL 103 10/26/2017 1444   CO2 22 10/26/2017 1444   CO2 26 08/31/2017 0841   GLUCOSE 124 10/26/2017 1444   GLUCOSE 106 08/31/2017 0841   BUN 17 10/26/2017 1444   BUN 14.7 08/31/2017 0841   CREATININE 1.08 10/26/2017 1444   CREATININE 1.2 08/31/2017 0841   CALCIUM 7.8 (L) 10/26/2017 1444     CALCIUM 8.8 08/31/2017 0841   PROT 6.4 10/26/2017 1444   PROT 6.2 (L) 08/31/2017 0841   ALBUMIN 3.3 (L) 10/26/2017 1444   ALBUMIN 3.0 (L) 08/31/2017 0841   AST 43 (H) 10/26/2017 1444   AST 31 08/31/2017 0841   ALT 45 10/26/2017 1444   ALT 17 08/31/2017 0841   ALKPHOS 885 (H) 10/26/2017 1444   ALKPHOS 350 (H) 08/31/2017 0841   BILITOT 0.9 10/26/2017 1444   BILITOT 0.50 08/31/2017 0841   GFRNONAA >60 10/26/2017 1444   GFRAA >60 10/26/2017 1444       Component Value Date/Time   WBC 4.0 10/26/2017 1444   WBC 9.7 10/19/2017 1202   RBC 3.60 (L) 10/26/2017 1444   HGB 10.3 (L) 10/19/2017 1202   HGB 13.8 08/26/2017 0816   HCT 31.0 (L) 10/26/2017 1444   HCT 40.9 08/26/2017 0816   PLT 25 (L) 10/26/2017 1444   PLT 149 08/26/2017 0816   MCV 86.1 10/26/2017 1444   MCV 88.1 08/26/2017 0816   MCH 29.4 10/26/2017 1444   MCHC 34.2 10/26/2017 1444   RDW 15.2 (H) 10/26/2017 1444   RDW 13.6 08/26/2017 0816   LYMPHSABS 0.6 (L) 10/26/2017 1444   LYMPHSABS 1.1 08/26/2017 0816   MONOABS 0.2 10/26/2017 1444   MONOABS 0.4 08/26/2017 0816   EOSABS 0.1 10/26/2017 1444   EOSABS 0.1 08/26/2017 0816   BASOSABS 0.0 10/26/2017 1444   BASOSABS 0.0 08/26/2017 0816   This patient was seen with Dr. Julien Nordmann with my treatment plan reviewed with him. He expressed agreement with my medical management of this patient.  ADDENDUM: Hematology/Oncology Attending: I had a face-to-face encounter with the patient.  I recommended his care plan.  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  The patient was a started on treatment with systemic  chemotherapy with carboplatin, Alimta and Keytruda every 3 weeks status post 2 cycles.  He continues to have significant shortness of breath.  I ordered CT scan of the chest, abdomen and pelvis recently that showed a stable disease and no evidence of pulmonary embolism.  He continues to have subjective dyspnea even with his oxygen saturation of 98% on room air. He is scheduled to see Dr. Durenda Hurt tomorrow for a second opinion and recommendation regarding his condition. I would start the patient on Medrol Dosepak as well as increase the frequency of his inhaler.  His shortness of breath is likely secondary to his disease. I also discussed with the patient consideration of repeat targeted therapy with first generation EGFR tyrosine kinase inhibitor like Tarceva plus/minus Avastin. I would wait to hear from Dr. Durenda Hurt regarding his condition and if they have any clinical trials that could benefit the patient at this point. He will come back for follow-up visit as previously scheduled. The patient was advised to call immediately if he has any concerning symptoms in the interval.  Disclaimer: This note was dictated with voice recognition software. Similar sounding words can inadvertently be transcribed and may be missed upon review. Eilleen Kempf, MD 10/27/17

## 2017-10-28 ENCOUNTER — Encounter: Payer: Self-pay | Admitting: Adult Health

## 2017-10-28 ENCOUNTER — Ambulatory Visit (INDEPENDENT_AMBULATORY_CARE_PROVIDER_SITE_OTHER): Payer: BLUE CROSS/BLUE SHIELD | Admitting: Adult Health

## 2017-10-28 VITALS — BP 128/80 | HR 95 | Ht 74.0 in | Wt 201.8 lb

## 2017-10-28 DIAGNOSIS — R0609 Other forms of dyspnea: Secondary | ICD-10-CM | POA: Diagnosis not present

## 2017-10-28 DIAGNOSIS — R0602 Shortness of breath: Secondary | ICD-10-CM

## 2017-10-28 DIAGNOSIS — J984 Other disorders of lung: Secondary | ICD-10-CM | POA: Diagnosis not present

## 2017-10-28 DIAGNOSIS — R0600 Dyspnea, unspecified: Secondary | ICD-10-CM | POA: Insufficient documentation

## 2017-10-28 DIAGNOSIS — J4531 Mild persistent asthma with (acute) exacerbation: Secondary | ICD-10-CM

## 2017-10-28 DIAGNOSIS — C3491 Malignant neoplasm of unspecified part of right bronchus or lung: Secondary | ICD-10-CM

## 2017-10-28 MED ORDER — BUDESONIDE-FORMOTEROL FUMARATE 80-4.5 MCG/ACT IN AERO: 2.0000 | INHALATION_SPRAY | Freq: Two times a day (BID) | RESPIRATORY_TRACT | 0 refills | Status: AC

## 2017-10-28 MED ORDER — ALBUTEROL SULFATE (2.5 MG/3ML) 0.083% IN NEBU: 2.5000 mg | INHALATION_SOLUTION | Freq: Four times a day (QID) | RESPIRATORY_TRACT | 5 refills | Status: AC | PRN

## 2017-10-28 MED ORDER — LEVALBUTEROL HCL 0.63 MG/3ML IN NEBU
0.6300 mg | INHALATION_SOLUTION | Freq: Once | RESPIRATORY_TRACT | Status: AC
  Administered 2017-10-28: 0.63 mg via RESPIRATORY_TRACT

## 2017-10-28 NOTE — Assessment & Plan Note (Signed)
Metastatic lung cancer   patient is continue to follow-up with oncology as planned.

## 2017-10-28 NOTE — Assessment & Plan Note (Signed)
Progressive dyspnea on exertion with minimal activity.  Suspect this is multifactorial with associated metastatic lung cancer.  Patient has significant consolidation in the left lower lobe that may be contributing to his restrictive process he also has some small airways disease on spirometry.  Patient has significant clinical improvement after albuterol nebulizer treatment.  He will be changed from Flovent to Symbicort.  May use albuterol inhaler and nebulizer as needed. Patient has had extensive chemo treatments over the last few year.  Will check a 2D echo for possible underlying issues.  Plan  Patient Instructions  Stop Flovent Begin Symbicort 80 2 puffs twice daily, rinse after use  Finish prednisone taper May use Ventolin inhaler every 4-6 hours as needed for wheezing or shortness of breath May use albuterol nebulizer every 4-6 hours as needed for wheezing or shortness of breath follow up Dr. Elsworth Soho  In 2-3 weeks and As needed   Please contact office for sooner follow up if symptoms do not improve or worsen or seek emergency care

## 2017-10-28 NOTE — Addendum Note (Signed)
Addended by: Parke Poisson E on: 10/28/2017 02:20 PM   Modules accepted: Orders

## 2017-10-28 NOTE — Progress Notes (Signed)
@Patient  ID: Oscar Floyd, male    DOB: 26-Feb-1966, 52 y.o.   MRN: [id number]  Chief Complaint  Patient presents with  . 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 but then developed EGFR resistance mutation in April 2017. Dz progression 12/2015 and 07/2017 dz progression (s/p FOB with +adenocarcinoma LLL consolidation)   Oncology notes:  Status post whole brain irradiation under the care Dr. Tammi Klippel completed 04/12/2014. 2) Gilotrif 40 mg po daily - therapy beginning 04/03/2014. Status post approximately 20 months of therapy discontinued secondary to disease progression and development of EGFR T790M resistant mutation.  3) palliative radiotherapy to the lytic lesion in the right clavicle. 4) Tagrisso 80 mg by mouth daily status post 19 months of treatment. First dose was given on 01/12/2016.  Discontinued secondary to disease progression and development of EGFR resistant mutation [id number]  CURRENT THERAPY: 1) systemic chemotherapy with carboplatin for AC of 5, Alimta 500 mg/M2 and Keytruda 200 mg IV every 3 weeks.  First dose September 09, 2017.  Status post 1 cycle. 2) Xgeva 120 g subcutaneously on monthly basis.  10/28/2017 Follow up : Lung Cancer , Dyspnea  Patient returns for a follow-up.  Patient has known metastatic lung cancer.  He has been undergoing treatment since 2015.  He is followed by oncology.  See above for previous and current treatment regimens.  Currently he is on systemic chemotherapy with carboplatin for AC of 5, Alimta 500 mg/M2 and Keytruda 200 mg IV every 3 weeks. Patient had recurrent disease progression noted in October 2018.  Patient been treated with multiple courses of  antibiotics for suspected pneumonia.  Biopsy showed positive adenocarcinoma.  Patient says since then his breathing has progressively worsened he is more short of breath with minimum activity.  Patient has associated fatigue.  He says he can tolerate the fatigue but he wears out with breath with minimal activity.  He is currently on Flovent twice daily.  This was recently increased to 4 puffs twice daily.  And patient was given a prednisone taper this week to see if this would help his breathing.  Patient has not noticed much improvement.  He does have intermittent cough.  No significant congestion.  No chest pain.  No hemoptysis, fever abdominal pain vomiting.  No leg swelling or calf pain. Patient was given albuterol nebulizer treatment in the office.  He says this has helped quite a bit.  Spirometry was done after his nebulizer and showed severe restriction with an FVC at 38% FEV1 40% and a ratio of 81.  Mid flows were decreased at 44%.      Patient has evidence of disease progression in the left lower lobe that has been biopsy proven to be recurrent non-small cell lung cancer adenocarcinoma.  CT chest with contrast done February 8 showed stable disease-widespread metastatic disease in the lungs bilaterally and throughout the skeleton.  No hepatic metastasis noted. compared to CT chest done in January 2019 There is a large masslike consolidation in the left lower lobe measuring max diameter at 9.9 cm.  Diffuse nodular thickening and scattered areas of micro and macro nodularity throughout the lungs noted bilaterally.  Right upper lobe nodule measuring 18 mm.  No pathologically enlarged mediastinal or hilar lymph nodes noted.  Patient was seen at [facility] oncology yesterday for second opinion and possible clinical study trials.  No Known Allergies  Immunization History  Administered Date(s) Administered  . Influenza Split 07/15/2011, 06/13/2012, 06/13/2013  . Influenza,inj,Quad PF,6+ Mos 08/11/2016     Past Medical History:  Diagnosis Date  . Bone metastases ([place]) 08/26/2015  . Encounter for antineoplastic chemotherapy 01/06/2016  . Family history of cancer   . Hypertension   . Lung cancer ([place])    RUL lung with mets to liver, bone and brain    Tobacco History: Social History   Tobacco Use  Smoking Status Never Smoker  Smokeless Tobacco Never Used   Counseling given: Not Answered   Outpatient Encounter Medications as of 10/28/2017  Medication Sig  . albuterol (PROVENTIL HFA;VENTOLIN HFA) 108 (90 Base) MCG/ACT inhaler Inhale 2 puffs into the lungs every 6 (six) hours as needed for wheezing or shortness of breath.  . ALPRAZolam (XANAX) 1 MG tablet Take 0.5-1 mg by mouth.  . dexamethasone (DECADRON) 4 MG tablet 4 mg p.o. twice daily the day before, day of and day after chemotherapy every 3 weeks  . diazepam (VALIUM) 10 MG tablet Take 10 mg by mouth. When having a brain MRI  . docusate sodium (COLACE) 100 MG capsule Take 100 mg by mouth daily.  Marland Kitchen esomeprazole (NEXIUM) 40 MG capsule Take 1 capsule (40 mg total) by mouth daily at 12 noon.  . Fluticasone Propionate, Inhal, 100 MCG/BLIST AEPB Inhale 2 puffs into the lungs 2 (two) times daily.  . folic acid (FOLVITE) 1 MG tablet Take 1 tablet (1 mg total) by mouth daily.  Marland Kitchen HYDROcodone-acetaminophen (NORCO/VICODIN) 5-325 MG tablet Take 1 tablet by mouth every 6 (six) hours as needed for moderate pain.  Marland Kitchen ibuprofen (ADVIL,MOTRIN) 800 MG tablet Take by mouth.  . MELATONIN PO Take 20 mg by mouth.  . naproxen sodium (ANAPROX) 220 MG tablet Take 220 mg 2 (two) times daily as needed by mouth.   . predniSONE (DELTASONE) 10 MG tablet 5 tab x 2 days, 4 tab x 2 days, 3 tab x 2 days, 2 tab x 2 days, 1 tab x 2 days, 1/2 tab x 2 days  . albuterol (PROVENTIL) (2.5 MG/3ML) 0.083% nebulizer solution Take 3 mLs (2.5 mg total) by nebulization every 6 (six) hours as needed for wheezing or shortness of breath. Dx: J45.31  . budesonide-formoterol  (SYMBICORT) 80-4.5 MCG/ACT inhaler Inhale 2 puffs into the lungs 2 (two) times daily.  . Hydrocodone-Homatropine 5-1.5 MG TABS Take 1 tablet by mouth 4 (four) times daily as needed. (Patient not taking: Reported on 10/28/2017)  . mirtazapine (REMERON) 30 MG tablet Take 1 tablet (30 mg total) by mouth at bedtime. (Patient not taking: Reported on 10/26/2017)  . prochlorperazine (COMPAZINE) 10 MG tablet Take 1 tablet (10 mg total) by mouth every 6 (six) hours as needed for nausea or vomiting. (Patient not taking: Reported on 10/28/2017)  . [EXPIRED] levalbuterol (XOPENEX) nebulizer solution 0.63 mg    No facility-administered encounter medications on file as of 10/28/2017.      Review of Systems  Constitutional:   No  weight loss, night sweats,  Fevers, chills, + fatigue, or  lassitude.  HEENT:   No headaches,  Difficulty swallowing,  Tooth/dental problems, or  Sore throat,                No sneezing, itching, ear ache, nasal congestion, post nasal drip,  CV:  No chest pain,  Orthopnea, PND, swelling in lower extremities, anasarca, dizziness, palpitations, syncope.   GI  No heartburn, indigestion, abdominal pain, nausea, vomiting, diarrhea, change in bowel habits, loss of appetite, bloody stools.   Resp:    No wheezing.  No chest wall deformity  Skin: no rash or lesions.  GU: no dysuria, change in color of urine, no urgency or frequency.  No flank pain, no hematuria   MS:  No joint pain or swelling.  No decreased range of motion.  No back pain.    Physical Exam  BP 128/80 (BP Location: Left Arm, Cuff Size: Normal)   Pulse 95   Ht 6' 2"  (1.88 m)   Wt 201 lb 12.8 oz (91.5 kg)   SpO2 97%   BMI 25.91 kg/m   GEN: A/Ox3; pleasant , NAD, thin chronically ill-appearing   HEENT:  [place]/AT,  EACs-clear, TMs-wnl, NOSE-clear, THROAT-clear, no lesions, no postnasal drip or exudate noted.   NECK:  Supple w/ fair ROM; no JVD; normal carotid impulses w/o bruits; no thyromegaly or nodules  palpated; no lymphadenopathy.    RESP  Decreased BS in bases ,  no accessory muscle use, no dullness to percussion  CARD:  RRR, no m/r/g, no peripheral edema, pulses intact, no cyanosis or clubbing.  GI:   Soft & nt; nml bowel sounds; no organomegaly or masses detected.   Musco: Warm bil, no deformities or joint swelling noted.   Neuro: alert, no focal deficits noted.    Skin: Warm, no lesions or rashes    Lab Results:  CBC    Component Value Date/Time   WBC 4.0 10/26/2017 1444   WBC 9.7 10/19/2017 1202   RBC 3.60 (L) 10/26/2017 1444   HGB 10.3 (L) 10/19/2017 1202   HGB 13.8 08/26/2017 0816   HCT 31.0 (L) 10/26/2017 1444   HCT 40.9 08/26/2017 0816   PLT 25 (L) 10/26/2017 1444   PLT 149 08/26/2017 0816   MCV 86.1 10/26/2017 1444   MCV 88.1 08/26/2017 0816   MCH 29.4 10/26/2017 1444   MCHC 34.2 10/26/2017 1444   RDW 15.2 (H) 10/26/2017 1444   RDW 13.6 08/26/2017 0816   LYMPHSABS 0.6 (L) 10/26/2017 1444   LYMPHSABS 1.1 08/26/2017 0816   MONOABS 0.2 10/26/2017 1444   MONOABS 0.4 08/26/2017 0816   EOSABS 0.1 10/26/2017 1444   EOSABS 0.1 08/26/2017 0816   BASOSABS 0.0 10/26/2017 1444   BASOSABS 0.0 08/26/2017 0816    BMET    Component Value Date/Time   NA 137 10/26/2017 1444   NA 141 08/31/2017 0841   K 3.9 10/26/2017 1444   K 4.4 08/31/2017 0841   CL 103 10/26/2017 1444   CO2 22 10/26/2017 1444   CO2 26 08/31/2017 0841   GLUCOSE 124 10/26/2017 1444   GLUCOSE 106 08/31/2017 0841   BUN 17 10/26/2017 1444   BUN 14.7 08/31/2017 0841   CREATININE 1.08 10/26/2017 1444   CREATININE 1.2 08/31/2017 0841   CALCIUM 7.8 (L) 10/26/2017 1444   CALCIUM 8.8 08/31/2017 0841   GFRNONAA >60 10/26/2017 1444   GFRAA >60 10/26/2017 1444    BNP No results found for: BNP  ProBNP No results found for: PROBNP  Imaging:    Assessment & Plan:   Dyspnea Progressive dyspnea on exertion with minimal activity.  Suspect this is multifactorial with associated metastatic lung  cancer.  Patient has significant consolidation in the left lower lobe that may be contributing to his restrictive process he  also has some small airways disease on spirometry.  Patient has significant clinical improvement after albuterol nebulizer treatment.  He will be changed from Flovent to Symbicort.  May use albuterol inhaler and nebulizer as needed. Patient has had extensive chemo treatments over the last few year.  Will check a 2D echo for possible underlying issues.  Plan  Patient Instructions  Stop Flovent Begin Symbicort 80 2 puffs twice daily, rinse after use  Finish prednisone taper May use Ventolin inhaler every 4-6 hours as needed for wheezing or shortness of breath May use albuterol nebulizer every 4-6 hours as needed for wheezing or shortness of breath follow up Dr. Elsworth Soho  In 2-3 weeks and As needed   Please contact office for sooner follow up if symptoms do not improve or worsen or seek emergency care       Non-small cell cancer of right lung [facility]) Metastatic lung cancer   patient is continue to follow-up with oncology as planned.     Rexene Edison, NP 10/28/2017

## 2017-10-28 NOTE — Patient Instructions (Addendum)
Stop Flovent Begin Symbicort 80 2 puffs twice daily, rinse after use  Finish prednisone taper May use Ventolin inhaler every 4-6 hours as needed for wheezing or shortness of breath May use albuterol nebulizer every 4-6 hours as needed for wheezing or shortness of breath follow up Dr. Elsworth Soho  In 2-3 weeks and As needed   Please contact office for sooner follow up if symptoms do not improve or worsen or seek emergency care

## 2017-10-31 ENCOUNTER — Telehealth: Payer: Self-pay | Admitting: *Deleted

## 2017-10-31 NOTE — Telephone Encounter (Signed)
Oncology Nurse Navigator Documentation  Oncology Nurse Navigator Flowsheets 10/31/2017  Navigator Location [facility]  Navigator Encounter Type Telephone/I received a call from Mr. Oscar Floyd. I called him back and spoke with him. He would like to be evaluated by rad onc for possible XRT.  I updated Dr. Julien Nordmann. He states ok for him to be seen with them.  I updated patient. I will updated Rad Onc scheduling team.   Telephone Incoming Call;Outgoing Call  Treatment Phase Treatment  Barriers/Navigation Needs Coordination of Care;Education  Education Other  Interventions Coordination of Care;Education  Coordination of Care Other  Education Method Verbal  Acuity Level 2  Time Spent with Patient 30

## 2017-10-31 NOTE — Telephone Encounter (Signed)
Oncology Nurse Navigator Documentation  Oncology Nurse Navigator Flowsheets 10/31/2017  Navigator Location [facility]  Navigator Encounter Type Telephone/I received a call from Mr. Oscar Floyd today.  I called him back but was unable to reach him.  I did leave vm message for him to call if needed.   Telephone Outgoing Call;Incoming Call  Treatment Phase Treatment  Barriers/Navigation Needs Education  Education Other  Interventions Education  Education Method Verbal  Acuity Level 1  Time Spent with Patient 15

## 2017-11-01 ENCOUNTER — Other Ambulatory Visit: Payer: Self-pay

## 2017-11-01 ENCOUNTER — Emergency Department (HOSPITAL_COMMUNITY): Payer: BLUE CROSS/BLUE SHIELD

## 2017-11-01 ENCOUNTER — Inpatient Hospital Stay (HOSPITAL_COMMUNITY)
Admission: EM | Admit: 2017-11-01 | Discharge: 2017-11-13 | DRG: 175 | Disposition: A | Discharge status: Home Health | Payer: BLUE CROSS/BLUE SHIELD | Attending: Internal Medicine | Admitting: Internal Medicine

## 2017-11-01 ENCOUNTER — Telehealth: Payer: Self-pay | Admitting: Pharmacist

## 2017-11-01 ENCOUNTER — Encounter (HOSPITAL_COMMUNITY): Payer: Self-pay

## 2017-11-01 ENCOUNTER — Telehealth: Payer: Self-pay | Admitting: Pharmacy Technician

## 2017-11-01 ENCOUNTER — Telehealth: Payer: Self-pay | Admitting: Radiation Oncology

## 2017-11-01 DIAGNOSIS — R748 Abnormal levels of other serum enzymes: Secondary | ICD-10-CM | POA: Diagnosis present

## 2017-11-01 DIAGNOSIS — Z79899 Other long term (current) drug therapy: Secondary | ICD-10-CM

## 2017-11-01 DIAGNOSIS — I951 Orthostatic hypotension: Secondary | ICD-10-CM | POA: Diagnosis not present

## 2017-11-01 DIAGNOSIS — R042 Hemoptysis: Secondary | ICD-10-CM | POA: Diagnosis not present

## 2017-11-01 DIAGNOSIS — D6481 Anemia due to antineoplastic chemotherapy: Secondary | ICD-10-CM

## 2017-11-01 DIAGNOSIS — D696 Thrombocytopenia, unspecified: Secondary | ICD-10-CM

## 2017-11-01 DIAGNOSIS — C799 Secondary malignant neoplasm of unspecified site: Secondary | ICD-10-CM | POA: Diagnosis present

## 2017-11-01 DIAGNOSIS — T451X5A Adverse effect of antineoplastic and immunosuppressive drugs, initial encounter: Secondary | ICD-10-CM | POA: Diagnosis present

## 2017-11-01 DIAGNOSIS — Z8249 Family history of ischemic heart disease and other diseases of the circulatory system: Secondary | ICD-10-CM | POA: Diagnosis not present

## 2017-11-01 DIAGNOSIS — T502X5A Adverse effect of carbonic-anhydrase inhibitors, benzothiadiazides and other diuretics, initial encounter: Secondary | ICD-10-CM | POA: Diagnosis not present

## 2017-11-01 DIAGNOSIS — Z923 Personal history of irradiation: Secondary | ICD-10-CM | POA: Diagnosis not present

## 2017-11-01 DIAGNOSIS — C3411 Malignant neoplasm of upper lobe, right bronchus or lung: Secondary | ICD-10-CM | POA: Diagnosis present

## 2017-11-01 DIAGNOSIS — C7931 Secondary malignant neoplasm of brain: Secondary | ICD-10-CM | POA: Diagnosis present

## 2017-11-01 DIAGNOSIS — C7951 Secondary malignant neoplasm of bone: Secondary | ICD-10-CM | POA: Diagnosis present

## 2017-11-01 DIAGNOSIS — R059 Cough, unspecified: Secondary | ICD-10-CM

## 2017-11-01 DIAGNOSIS — R945 Abnormal results of liver function studies: Secondary | ICD-10-CM | POA: Diagnosis present

## 2017-11-01 DIAGNOSIS — D649 Anemia, unspecified: Secondary | ICD-10-CM

## 2017-11-01 DIAGNOSIS — Z801 Family history of malignant neoplasm of trachea, bronchus and lung: Secondary | ICD-10-CM

## 2017-11-01 DIAGNOSIS — Z7951 Long term (current) use of inhaled steroids: Secondary | ICD-10-CM | POA: Diagnosis not present

## 2017-11-01 DIAGNOSIS — C787 Secondary malignant neoplasm of liver and intrahepatic bile duct: Secondary | ICD-10-CM | POA: Diagnosis present

## 2017-11-01 DIAGNOSIS — I2699 Other pulmonary embolism without acute cor pulmonale: Principal | ICD-10-CM | POA: Diagnosis present

## 2017-11-01 DIAGNOSIS — R05 Cough: Secondary | ICD-10-CM

## 2017-11-01 DIAGNOSIS — R0600 Dyspnea, unspecified: Secondary | ICD-10-CM

## 2017-11-01 DIAGNOSIS — D61818 Other pancytopenia: Secondary | ICD-10-CM | POA: Diagnosis present

## 2017-11-01 DIAGNOSIS — R0602 Shortness of breath: Secondary | ICD-10-CM | POA: Diagnosis not present

## 2017-11-01 DIAGNOSIS — R0902 Hypoxemia: Secondary | ICD-10-CM | POA: Diagnosis present

## 2017-11-01 DIAGNOSIS — J189 Pneumonia, unspecified organism: Secondary | ICD-10-CM | POA: Diagnosis present

## 2017-11-01 DIAGNOSIS — I2690 Septic pulmonary embolism without acute cor pulmonale: Secondary | ICD-10-CM

## 2017-11-01 DIAGNOSIS — C3490 Malignant neoplasm of unspecified part of unspecified bronchus or lung: Secondary | ICD-10-CM

## 2017-11-01 DIAGNOSIS — R7989 Other specified abnormal findings of blood chemistry: Secondary | ICD-10-CM

## 2017-11-01 DIAGNOSIS — R0609 Other forms of dyspnea: Secondary | ICD-10-CM | POA: Diagnosis not present

## 2017-11-01 DIAGNOSIS — I10 Essential (primary) hypertension: Secondary | ICD-10-CM | POA: Diagnosis present

## 2017-11-01 LAB — CBC WITH DIFFERENTIAL/PLATELET
BASOS PCT: 1 %
Basophils Absolute: 0.2 10*3/uL — ABNORMAL HIGH (ref 0.0–0.1)
Eosinophils Absolute: 0.4 10*3/uL (ref 0.0–0.7)
Eosinophils Relative: 2 %
HEMATOCRIT: 22.7 % — AB (ref 39.0–52.0)
Hemoglobin: 7.8 g/dL — ABNORMAL LOW (ref 13.0–17.0)
Lymphocytes Relative: 7 %
Lymphs Abs: 1.5 10*3/uL (ref 0.7–4.0)
MCH: 29.9 pg (ref 26.0–34.0)
MCHC: 34.4 g/dL (ref 30.0–36.0)
MCV: 87 fL (ref 78.0–100.0)
MONO ABS: 2.5 10*3/uL — AB (ref 0.1–1.0)
Monocytes Relative: 12 %
NEUTROS PCT: 78 %
Neutro Abs: 16.3 10*3/uL — ABNORMAL HIGH (ref 1.7–7.7)
Platelets: 19 10*3/uL — CL (ref 150–400)
RBC: 2.61 MIL/uL — ABNORMAL LOW (ref 4.22–5.81)
RDW: 15.8 % — AB (ref 11.5–15.5)
WBC: 20.9 10*3/uL — ABNORMAL HIGH (ref 4.0–10.5)
nRBC: 1 /100 WBC — ABNORMAL HIGH

## 2017-11-01 LAB — TROPONIN I
TROPONIN I: 0.76 ng/mL — AB (ref <0.03)
Troponin I: 0.96 ng/mL (ref <0.03)

## 2017-11-01 LAB — COMPREHENSIVE METABOLIC PANEL
ALK PHOS: 811 U/L — AB (ref 38–126)
ALT: 50 U/L (ref 17–63)
AST: 45 U/L — ABNORMAL HIGH (ref 15–41)
Albumin: 3.3 g/dL — ABNORMAL LOW (ref 3.5–5.0)
Anion gap: 11 (ref 5–15)
BILIRUBIN TOTAL: 0.6 mg/dL (ref 0.3–1.2)
BUN: 16 mg/dL (ref 6–20)
CALCIUM: 7.9 mg/dL — AB (ref 8.9–10.3)
CO2: 23 mmol/L (ref 22–32)
CREATININE: 1.08 mg/dL (ref 0.61–1.24)
Chloride: 106 mmol/L (ref 101–111)
GFR calc non Af Amer: >60 mL/min (ref >60)
Glucose, Bld: 98 mg/dL (ref 65–99)
Potassium: 4 mmol/L (ref 3.5–5.1)
SODIUM: 140 mmol/L (ref 135–145)
Total Protein: 5.8 g/dL — ABNORMAL LOW (ref 6.5–8.1)

## 2017-11-01 LAB — I-STAT TROPONIN, ED: Troponin i, poc: 0.27 ng/mL (ref 0.00–0.08)

## 2017-11-01 MED ORDER — PREDNISONE 20 MG PO TABS: 20.0000 mg | ORAL_TABLET | Freq: Every day | ORAL | Status: DC

## 2017-11-01 MED ORDER — DOCUSATE SODIUM 100 MG PO CAPS
100.0000 mg | ORAL_CAPSULE | Freq: Every day | ORAL | Status: DC
  Administered 2017-11-02 – 2017-11-13 (×10): 100 mg via ORAL
  Filled 2017-11-01 (×13): qty 1

## 2017-11-01 MED ORDER — ACETAMINOPHEN 325 MG PO TABS: 650.0000 mg | ORAL_TABLET | Freq: Four times a day (QID) | ORAL | Status: DC | PRN

## 2017-11-01 MED ORDER — ALBUTEROL SULFATE (2.5 MG/3ML) 0.083% IN NEBU
2.5000 mg | INHALATION_SOLUTION | Freq: Three times a day (TID) | RESPIRATORY_TRACT | Status: DC
  Administered 2017-11-02 – 2017-11-09 (×23): 2.5 mg via RESPIRATORY_TRACT
  Filled 2017-11-01 (×23): qty 3

## 2017-11-01 MED ORDER — MOMETASONE FURO-FORMOTEROL FUM 100-5 MCG/ACT IN AERO
2.0000 | INHALATION_SPRAY | Freq: Two times a day (BID) | RESPIRATORY_TRACT | Status: DC
  Administered 2017-11-01 – 2017-11-13 (×24): 2 via RESPIRATORY_TRACT
  Filled 2017-11-01 (×2): qty 8.8

## 2017-11-01 MED ORDER — HYDROCODONE-ACETAMINOPHEN 5-325 MG PO TABS
1.0000 | ORAL_TABLET | ORAL | Status: DC | PRN
  Administered 2017-11-03 – 2017-11-05 (×4): 1 via ORAL
  Administered 2017-11-09 – 2017-11-12 (×5): 2 via ORAL
  Filled 2017-11-01: qty 2
  Filled 2017-11-01: qty 1
  Filled 2017-11-01 (×4): qty 2
  Filled 2017-11-01 (×3): qty 1

## 2017-11-01 MED ORDER — SENNOSIDES-DOCUSATE SODIUM 8.6-50 MG PO TABS
1.0000 | ORAL_TABLET | Freq: Every evening | ORAL | Status: DC | PRN
Start: 2017-11-01 — End: 2017-11-13

## 2017-11-01 MED ORDER — PREDNISONE 5 MG PO TABS
10.0000 mg | ORAL_TABLET | Freq: Every day | ORAL | Status: DC
  Administered 2017-11-04: 10 mg via ORAL
  Filled 2017-11-01: qty 2

## 2017-11-01 MED ORDER — ALBUTEROL SULFATE (2.5 MG/3ML) 0.083% IN NEBU
INHALATION_SOLUTION | RESPIRATORY_TRACT | Status: AC
  Filled 2017-11-01: qty 3

## 2017-11-01 MED ORDER — ONDANSETRON HCL 4 MG PO TABS: 4.0000 mg | ORAL_TABLET | Freq: Four times a day (QID) | ORAL | Status: DC | PRN

## 2017-11-01 MED ORDER — ONDANSETRON HCL 4 MG/2ML IJ SOLN: 4.0000 mg | Freq: Four times a day (QID) | INTRAMUSCULAR | Status: DC | PRN

## 2017-11-01 MED ORDER — BISACODYL 5 MG PO TBEC: 5.0000 mg | DELAYED_RELEASE_TABLET | Freq: Every day | ORAL | Status: DC | PRN

## 2017-11-01 MED ORDER — IOPAMIDOL (ISOVUE-370) INJECTION 76%
INTRAVENOUS | Status: AC
  Filled 2017-11-01: qty 100

## 2017-11-01 MED ORDER — PREDNISONE 5 MG PO TABS: 5.0000 mg | ORAL_TABLET | Freq: Every day | ORAL | Status: DC

## 2017-11-01 MED ORDER — FOLIC ACID 1 MG PO TABS
1.0000 mg | ORAL_TABLET | Freq: Every day | ORAL | Status: DC
  Administered 2017-11-01 – 2017-11-13 (×13): 1 mg via ORAL
  Filled 2017-11-01 (×13): qty 1

## 2017-11-01 MED ORDER — ALPRAZOLAM 0.5 MG PO TABS
0.5000 mg | ORAL_TABLET | Freq: Three times a day (TID) | ORAL | Status: DC | PRN
  Administered 2017-11-01 – 2017-11-12 (×13): 0.5 mg via ORAL
  Filled 2017-11-01 (×14): qty 1

## 2017-11-01 MED ORDER — HYDROCODONE-ACETAMINOPHEN 5-325 MG PO TABS: 1.0000 | ORAL_TABLET | Freq: Four times a day (QID) | ORAL | Status: DC | PRN

## 2017-11-01 MED ORDER — MIRTAZAPINE 15 MG PO TABS
30.0000 mg | ORAL_TABLET | Freq: Every day | ORAL | Status: DC
  Administered 2017-11-01 – 2017-11-12 (×12): 30 mg via ORAL
  Filled 2017-11-01 (×12): qty 2

## 2017-11-01 MED ORDER — PREDNISONE 10 MG PO TABS
30.0000 mg | ORAL_TABLET | Freq: Every day | ORAL | Status: AC
  Administered 2017-11-01: 30 mg via ORAL
  Filled 2017-11-01: qty 1

## 2017-11-01 MED ORDER — SODIUM CHLORIDE 0.9% FLUSH
10.0000 mL | INTRAVENOUS | Status: DC | PRN
  Administered 2017-11-01 – 2017-11-02 (×2): 20 mL
  Administered 2017-11-05 – 2017-11-10 (×3): 10 mL
  Filled 2017-11-01 (×4): qty 40

## 2017-11-01 MED ORDER — FLUTICASONE PROPIONATE (INHAL) 100 MCG/BLIST IN AEPB: 2.0000 | INHALATION_SPRAY | Freq: Two times a day (BID) | RESPIRATORY_TRACT | Status: DC

## 2017-11-01 MED ORDER — SODIUM CHLORIDE 0.9 % IV SOLN
INTRAVENOUS | Status: DC
  Administered 2017-11-01 – 2017-11-07 (×8): via INTRAVENOUS

## 2017-11-01 MED ORDER — ALBUTEROL SULFATE (2.5 MG/3ML) 0.083% IN NEBU: 2.5000 mg | INHALATION_SOLUTION | Freq: Four times a day (QID) | RESPIRATORY_TRACT | Status: DC | PRN

## 2017-11-01 MED ORDER — IOPAMIDOL (ISOVUE-370) INJECTION 76%
100.0000 mL | Freq: Once | INTRAVENOUS | Status: AC | PRN
  Administered 2017-11-01: 67 mL via INTRAVENOUS

## 2017-11-01 MED ORDER — PREDNISONE 20 MG PO TABS
20.0000 mg | ORAL_TABLET | Freq: Every day | ORAL | Status: AC
  Administered 2017-11-02 – 2017-11-03 (×2): 20 mg via ORAL
  Filled 2017-11-01 (×2): qty 1

## 2017-11-01 MED ORDER — ALBUTEROL SULFATE (2.5 MG/3ML) 0.083% IN NEBU
2.5000 mg | INHALATION_SOLUTION | Freq: Four times a day (QID) | RESPIRATORY_TRACT | Status: DC
  Administered 2017-11-01 (×2): 2.5 mg via RESPIRATORY_TRACT
  Filled 2017-11-01: qty 3

## 2017-11-01 MED ORDER — SODIUM CHLORIDE 0.9 % IV BOLUS (SEPSIS)
500.0000 mL | Freq: Once | INTRAVENOUS | Status: AC
  Administered 2017-11-01: 500 mL via INTRAVENOUS

## 2017-11-01 MED ORDER — PROCHLORPERAZINE MALEATE 10 MG PO TABS
10.0000 mg | ORAL_TABLET | Freq: Four times a day (QID) | ORAL | Status: DC | PRN
  Filled 2017-11-01: qty 1

## 2017-11-01 MED ORDER — ENSURE ENLIVE PO LIQD
237.0000 mL | Freq: Two times a day (BID) | ORAL | Status: DC
  Administered 2017-11-02 (×2): 237 mL via ORAL
  Filled 2017-11-01 (×2): qty 237

## 2017-11-01 MED ORDER — ALBUTEROL SULFATE (2.5 MG/3ML) 0.083% IN NEBU
5.0000 mg | INHALATION_SOLUTION | Freq: Once | RESPIRATORY_TRACT | Status: AC
  Administered 2017-11-01: 5 mg via RESPIRATORY_TRACT
  Filled 2017-11-01: qty 6

## 2017-11-01 MED ORDER — ACETAMINOPHEN 650 MG RE SUPP: 650.0000 mg | Freq: Four times a day (QID) | RECTAL | Status: DC | PRN

## 2017-11-01 MED ORDER — LORAZEPAM 2 MG/ML IJ SOLN
1.0000 mg | Freq: Once | INTRAMUSCULAR | Status: AC
  Administered 2017-11-01: 1 mg via INTRAVENOUS
  Filled 2017-11-01: qty 1

## 2017-11-01 MED ORDER — PANTOPRAZOLE SODIUM 40 MG PO TBEC
40.0000 mg | DELAYED_RELEASE_TABLET | Freq: Every day | ORAL | Status: DC
  Administered 2017-11-01 – 2017-11-13 (×13): 40 mg via ORAL
  Filled 2017-11-01 (×13): qty 1

## 2017-11-01 NOTE — Telephone Encounter (Signed)
Oscar Floyd called me to update me she had called 911.  I will check on them later today. Thanks Hinton Dyer

## 2017-11-01 NOTE — H&P (Addendum)
History and Physical    Oscar Floyd XBM:[id number] DOB: Feb 03, 1966 DOA: 11/01/2017  PCP: [name], MD Patient coming from: Home  Chief Complaint: Shortness of breath  HPI: Oscar Floyd is a 52 y.o. male with medical history significant of [name] on chemotherapy status post brain radiation in 2015, thrombocytopenia comes to the hospital with complains of shortness of breath.  Patient states he has had progressive shortness of breath over the course of past 2 weeks which at first was thought due to progression of his lung cancer.  Currently he received his last chemotherapy about 10 weeks ago and is supposed to be getting referred to [facility] for a second opinion due to some resistant mutation of his cancer.  But due to progression of the symptoms over the last 3 days more so he decided to come to the ER for further evaluation. In the ER patient was noted to have severe thrombocytopenia with platelet count of 19,000, hemoglobin of 7.8 and WBC of 20.9.  His troponin was elevated 0.27.  CTA of the chest was done which showed acute pulmonary embolism.  There is no signs of bleeding.  He was saturating about 95% on room air. After an extensive discussion with Dr. Earlie Server from oncology and Dr. Lake Bells from critical care it was determined for now will closely monitor the patient until his platelets recover to start him on anticoagulation.  In the meantime if it becomes necessary we will change plan.  Monitor him closely in the hospital before his symptoms.   Review of Systems: As per HPI otherwise 10 point review of systems negative.   Past Medical History:  Diagnosis Date  . Bone metastases ([place]) 08/26/2015  . Encounter for antineoplastic chemotherapy 01/06/2016  . Family history of cancer   . Hypertension   . Lung cancer ([place])    RUL lung with mets to liver, bone and brain    Past Surgical History:  Procedure Laterality Date  . IR FLUORO GUIDE PORT  INSERTION RIGHT  10/19/2017  . IR US GUIDE VASC ACCESS RIGHT  10/19/2017  . VASECTOMY    . VIDEO BRONCHOSCOPY Bilateral 03/20/2014   Procedure: VIDEO BRONCHOSCOPY WITH FLUORO;  Surgeon: Rigoberto Noel, MD;  Location: [facility];  Service: Cardiopulmonary;  Laterality: Bilateral;  . VIDEO BRONCHOSCOPY Bilateral 07/20/2017   Procedure: VIDEO BRONCHOSCOPY WITH FLUORO;  Surgeon: Rigoberto Noel, MD;  Location: [place];  Service: Cardiopulmonary;  Laterality: Bilateral;     reports that  has never smoked. he has never used smokeless tobacco. He reports that he drinks alcohol. He reports that he does not use drugs.  No Known Allergies  Family History  Problem Relation Age of Onset  . Cancer Father 71       liposarcoma age 60  . Cancer Mother 70       gist  . Cancer Paternal Grandfather        lung cancer; heavy smoker; also had abdominal cancer in 16's  . Stroke Maternal Grandmother   . Heart disease Maternal Grandfather   . Cancer Other        multiple brothers of PGF with unknown cancer     Prior to Admission medications   Medication Sig Start Date End Date Taking? Authorizing Provider  albuterol (PROVENTIL HFA;VENTOLIN HFA) 108 (90 Base) MCG/ACT inhaler Inhale 2 puffs into the lungs every 6 (six) hours as needed for wheezing or shortness of breath. 08/31/17  Yes Harle Stanford., PA-C  albuterol (PROVENTIL) (2.5 MG/3ML) 0.083% nebulizer solution Take 3 mLs (2.5 mg total) by nebulization every 6 (six) hours as needed for wheezing or shortness of breath. Dx: J45.31 10/28/17  Yes Parrett, Fonnie Mu, NP  ALPRAZolam (XANAX) 1 MG tablet Take 0.5-1 mg by mouth. 04/16/14  Yes [provider]  budesonide-formoterol (SYMBICORT) 80-4.5 MCG/ACT inhaler Inhale 2 puffs into the lungs 2 (two) times daily. 10/28/17  Yes Parrett, Tammy S, NP  diazepam (VALIUM) 10 MG tablet Take 10 mg by mouth. When having a brain MRI 11/06/15  Yes [provider]  docusate sodium (COLACE) 100 MG capsule Take  100 mg by mouth daily.   Yes [provider]  esomeprazole (NEXIUM) 40 MG capsule Take 1 capsule (40 mg total) by mouth daily at 12 noon. 10/07/17  Yes Tanner, Lyndon Code., PA-C  Fluticasone Propionate, Inhal, 100 MCG/BLIST AEPB Inhale 2 puffs into the lungs 2 (two) times daily. 08/31/17  Yes Tanner, Lyndon Code., PA-C  folic acid (FOLVITE) 1 MG tablet Take 1 tablet (1 mg total) by mouth daily. 09/05/17  Yes [name], MD  HYDROcodone-acetaminophen (NORCO/VICODIN) 5-325 MG tablet Take 1 tablet by mouth every 6 (six) hours as needed for moderate pain. 10/07/17  Yes Tanner, Lyndon Code., PA-C  ibuprofen (ADVIL,MOTRIN) 800 MG tablet Take by mouth. 11/28/13  Yes [provider]  MELATONIN PO Take 20 mg by mouth.   Yes [provider]  naproxen sodium (ANAPROX) 220 MG tablet Take 220 mg 2 (two) times daily as needed by mouth.    Yes [provider]  predniSONE (DELTASONE) 10 MG tablet 5 tab x 2 days, 4 tab x 2 days, 3 tab x 2 days, 2 tab x 2 days, 1 tab x 2 days, 1/2 tab x 2 days 10/26/17  Yes Tanner, Lyndon Code., PA-C  dexamethasone (DECADRON) 4 MG tablet 4 mg p.o. twice daily the day before, day of and day after chemotherapy every 3 weeks Patient not taking: Reported on 11/01/2017 09/05/17   [name], MD  Hydrocodone-Homatropine 5-1.5 MG TABS Take 1 tablet by mouth 4 (four) times daily as needed. Patient not taking: Reported on 10/28/2017 08/23/17   [name], MD  mirtazapine (REMERON) 30 MG tablet Take 1 tablet (30 mg total) by mouth at bedtime. Patient not taking: Reported on 10/26/2017 02/07/15   [name], MD  prochlorperazine (COMPAZINE) 10 MG tablet Take 1 tablet (10 mg total) by mouth every 6 (six) hours as needed for nausea or vomiting. Patient not taking: Reported on 10/28/2017 09/05/17   [name], MD    Physical Exam: Vitals:   11/01/17 1018  BP: 113/73  Pulse: 82  Resp: 20  Temp: 98.3 F (36.8 C)  TempSrc: Oral  SpO2: 100%       Constitutional: NAD, calm, comfortable Vitals:   11/01/17 1018  BP: 113/73  Pulse: 82  Resp: 20  Temp: 98.3 F (36.8 C)  TempSrc: Oral  SpO2: 100%   Eyes: PERRL, lids and conjunctivae normal ENMT: Mucous membranes are moist. Posterior pharynx clear of any exudate or lesions.Normal dentition.  Neck: normal, supple, no masses, no thyromegaly Respiratory: clear to auscultation bilaterally, no wheezing, no crackles. Normal respiratory effort. No accessory muscle use.  Cardiovascular: Regular rate and rhythm, no murmurs / rubs / gallops. No extremity edema. 2+ pedal pulses. No carotid bruits.  Abdomen: no tenderness, no masses palpated. No hepatosplenomegaly. Bowel sounds positive.  Musculoskeletal: no clubbing / cyanosis. No joint deformity upper and lower extremities. Good  ROM, no contractures. Normal muscle tone.  Skin: no rashes, lesions, ulcers. No induration Neurologic: CN 2-12 grossly intact. Sensation intact, DTR normal. Strength 5/5 in all 4.  Psychiatric: Normal judgment and insight. Alert and oriented x 3. Normal mood.     Labs on Admission: I have personally reviewed following labs and imaging studies  CBC: Recent Labs  Lab 10/26/17 1444 11/01/17 1043  WBC 4.0 20.9*  NEUTROABS 3.1 16.3*  HGB  --  7.8*  HCT 31.0* 22.7*  MCV 86.1 87.0  PLT 25* 19*   Basic Metabolic Panel: Recent Labs  Lab 10/26/17 1444 11/01/17 1043  NA 137 140  K 3.9 4.0  CL 103 106  CO2 22 23  GLUCOSE 124 98  BUN 17 16  CREATININE 1.08 1.08  CALCIUM 7.8* 7.9*   GFR: Estimated Creatinine Clearance: 96.2 mL/min (by C-G formula based on SCr of 1.08 mg/dL). Liver Function Tests: Recent Labs  Lab 10/26/17 1444 11/01/17 1043  AST 43* 45*  ALT 45 50  ALKPHOS 885* 811*  BILITOT 0.9 0.6  PROT 6.4 5.8*  ALBUMIN 3.3* 3.3*   No results for input(s): LIPASE, AMYLASE in the last 168 hours. No results for input(s): AMMONIA in the last 168 hours. Coagulation Profile: No results  for input(s): INR, PROTIME in the last 168 hours. Cardiac Enzymes: No results for input(s): CKTOTAL, CKMB, CKMBINDEX, TROPONINI in the last 168 hours. BNP (last 3 results) No results for input(s): PROBNP in the last 8760 hours. HbA1C: No results for input(s): HGBA1C in the last 72 hours. CBG: No results for input(s): GLUCAP in the last 168 hours. Lipid Profile: No results for input(s): CHOL, HDL, LDLCALC, TRIG, CHOLHDL, LDLDIRECT in the last 72 hours. Thyroid Function Tests: No results for input(s): TSH, T4TOTAL, FREET4, T3FREE, THYROIDAB in the last 72 hours. Anemia Panel: No results for input(s): VITAMINB12, FOLATE, FERRITIN, TIBC, IRON, RETICCTPCT in the last 72 hours. Urine analysis: No results found for: COLORURINE, APPEARANCEUR, LABSPEC, PHURINE, GLUCOSEU, HGBUR, BILIRUBINUR, KETONESUR, PROTEINUR, UROBILINOGEN, NITRITE, LEUKOCYTESUR Sepsis Labs: !!!!!!!!!!!!!!!!!!!!!!!!!!!!!!!!!!!!!!!!!!!! @LABRCNTIP (procalcitonin:4,lacticidven:4) )No results found for this or any previous visit (from the past 240 hour(s)).   Radiological Exams on Admission: Dg Chest 2 View  Result Date: 11/01/2017 CLINICAL DATA:  Shortness of breath.  History of lung cancer EXAM: CHEST  2 VIEW COMPARISON:  10/21/2017 FINDINGS: There is generalized interstitial and airspace opacity. Much of this opacity is from alveolar and lymphangitic tumor based on prior CT. There is newly increased density especially on the right which could reflect superimposed infection. No visible effusion or Kerley lines. Normal heart size and mediastinal contours. Generalized bony metastases. IMPRESSION: Interstitial and alveolar tumor bilaterally based on recent staging chest CT. There is superimposed acute opacity, especially on the right, which may reflect pneumonia or asymmetric edema. Electronically Signed   By: Monte Fantasia M.D.   On: 11/01/2017 11:31   Ct Angio Chest Pe W And/or Wo Contrast  Result Date: 11/01/2017 CLINICAL DATA:   PE, high pretest probability. Lung cancer with shortness of breath. EXAM: CT ANGIOGRAPHY CHEST WITH CONTRAST TECHNIQUE: Multidetector CT imaging of the chest was performed using the standard protocol during bolus administration of intravenous contrast. Multiplanar CT image reconstructions and MIPs were obtained to evaluate the vascular anatomy. CONTRAST:  27mL ISOVUE-370 IOPAMIDOL (ISOVUE-370) INJECTION 76% COMPARISON:  Chest CT 10/21/2017 FINDINGS: Cardiovascular: Normal heart size. Small pericardial effusion without nodularity or increased density. Porta catheter on the right with tip at the right atrium. There is satisfactory opacification of the  pulmonary arterial tree but still significant limitation by patient motion. There is a interlobar pulmonary artery clot that is nonobstructive. More peripheral clot could be present but not seen due to the degree of motion. Given the small volume of clot there is no suspected right heart strain. Accuracy of RV to LV measurement is diminished by degree of cardiac motion. Mediastinum/Nodes: Negative for adenopathy Lungs/Pleura: There is generalized interstitial nodularity with masslike consolidation in the left lower lobe and patchy nodular densities in the right lung. There is superimposed asymmetric airspace disease in the right upper lung. Interlobular septal thickening seen bilaterally at the apex. Trace pleural fluid on the left, also seen previously. Upper Abdomen: No acute finding Musculoskeletal: Diffuse osteoblastic metastatic disease. No acute finding. Critical Value/emergent results were called by telephone at the time of interpretation on 11/01/2017 at 12:16 pm to Dr. Davonna Belling , who verbally acknowledged these results. Review of the MIP images confirms the above findings. IMPRESSION: 1. Acute pulmonary embolism at the interlobar pulmonary artery. There could be distal pulmonary emboli obscured by degree of motion artifact. 2. Acute airspace opacity  asymmetric to the right. This is primarily concerning for infection in this patient with leukocytosis. Asymmetric edema or drug reaction are additional considerations. 3. Known metastatic lung cancer most recently staged 10/21/17. Electronically Signed   By: Monte Fantasia M.D.   On: 11/01/2017 12:21    EKG: Independently reviewed. Non acute changes  Assessment/Plan Active Problems:   Pulmonary embolism (HCC)    Acute interlobular pulmonary embolism Mild acute respiratory distress - Admit the patient to the hospital for closer monitoring -Platelet count currently is 19,000 without any evidence of bleeding.  Spoke with Dr. Julien Nordmann from oncology who recommends holding off on anticoagulation at this time until his platelets recover greater than 50,000.  In the meantime will closely monitor him in the hospital in case if he needs transfusion/starting emergently on anticoagulation.  Also spoke with Dr. Lake Bells from critical care/pulmonary who agrees with this. -Continue provide supplemental oxygen at this time and closely monitor his oxygen requirement -Provide supportive care -Nebulizer treatments as needed  Elevated troponin -Likely secondary to pulmonary embolism.  He is chest pain-free at this time - We will trend cardiac enzymes at this time, EKG without any acute ST-T changes  Leukocytosis Severe thrombocytopenia without any bleeding Anemia -Thrombocytopenia and anemia secondary to him receiving chemotherapy about 10 days ago.  No active signs of bleeding.  Leukocytosis most likely appears to be reactive in nature plus he is on outpatient steroids.  We will keep a close eye on this.  Low threshold for performing blood cultures and starting him on antibiotics if necessary.  [name], poorly differentiated adenocarcinoma -Currently getting outpatient chemotherapy with Dr. Earlie Server, status post whole brain radiation in 2015.  Plans to refer him to [facility]  for second opinion due to concerns of resistant mutation      DVT prophylaxis: None due to thrombocytopenia Code Status: Full code Family Communication: Wife at bedside Disposition Plan: To be determined Consults called: Hematology oncology-Dr. Earlie Server, spoke with Dr Lake Bells from [facility] over the phone Admission status: Inpatient   Nitin Mckowen Arsenio Loader MD [facility] Pager [phone]  If 7PM-7AM, please contact night-coverage [email or web address] Password [email or web address]  11/01/2017, 1:55 PM

## 2017-11-01 NOTE — ED Notes (Signed)
ED TO INPATIENT HANDOFF REPORT  Name/Age/Gender Oscar Floyd 52 y.o. male  Code Status    Code Status Orders  (From admission, onward)        Start     Ordered   11/01/17 1353  Full code  Continuous     11/01/17 1354    Code Status History    Date Active Date Inactive Code Status Order ID Comments User Context   This patient has a current code status but no historical code status.    Advance Directive Documentation     Most Recent Value  Type of Advance Directive  Healthcare Power of Attorney  Pre-existing out of facility DNR order (yellow form or pink MOST form)  No data  "MOST" Form in Place?  No data      Home/SNF/Other Home  Chief Complaint SOB  Level of Care/Admitting Diagnosis ED Disposition    ED Disposition Condition Comment   Admit  Hospital Area: [facility] [[id number]]  Level of Care: Telemetry [5]  Admit to tele based on following criteria: Monitor QTC interval  Diagnosis: Pulmonary embolism [facility]) [[id number]]  Admitting Physician: Gerlean Ren [facility] [[id number]]  Attending Physician: Gerlean Ren [facility] [[id number]]  Estimated length of stay: past midnight tomorrow  Certification:: I certify this patient will need inpatient services for at least 2 midnights  PT Class (Do Not Modify): Inpatient [101]  PT Acc Code (Do Not Modify): Private [1]       Medical History Past Medical History:  Diagnosis Date  . Bone metastases ([place]) 08/26/2015  . Encounter for antineoplastic chemotherapy 01/06/2016  . Family history of cancer   . Hypertension   . Lung cancer ([place])    RUL lung with mets to liver, bone and brain    Allergies No Known Allergies  IV Location/Drains/Wounds Patient Lines/Drains/Airways Status   Active Line/Drains/Airways    Name:   Placement date:   Placement time:   Site:   Days:   Implanted Port Right Chest   -    -    Chest             Labs/Imaging Results for orders placed or performed during the hospital encounter  of 11/01/17 (from the past 48 hour(s))  Comprehensive metabolic panel     Status: Abnormal   Collection Time: 11/01/17 10:43 AM  Result Value Ref Range   Sodium 140 135 - 145 mmol/L   Potassium 4.0 3.5 - 5.1 mmol/L   Chloride 106 101 - 111 mmol/L   CO2 23 22 - 32 mmol/L   Glucose, Bld 98 65 - 99 mg/dL   BUN 16 6 - 20 mg/dL   Creatinine, Ser 1.08 0.61 - 1.24 mg/dL   Calcium 7.9 (L) 8.9 - 10.3 mg/dL   Total Protein 5.8 (L) 6.5 - 8.1 g/dL   Albumin 3.3 (L) 3.5 - 5.0 g/dL   AST 45 (H) 15 - 41 U/L   ALT 50 17 - 63 U/L   Alkaline Phosphatase 811 (H) 38 - 126 U/L   Total Bilirubin 0.6 0.3 - 1.2 mg/dL   GFR calc non Af Amer >60 >60 mL/min   GFR calc Af Amer >60 >60 mL/min    Comment: (NOTE) The eGFR has been calculated using the CKD EPI equation. This calculation has not been validated in all clinical situations. eGFR's persistently <60 mL/min signify possible Chronic Kidney Disease.    Anion gap 11 5 - 15    Comment: Performed at [facility], [place] [address] [place], [place] [address]  CBC with Differential     Status: Abnormal   Collection Time: 11/01/17 10:43 AM  Result Value Ref Range   WBC 20.9 (H) 4.0 - 10.5 K/uL    Comment: ADJUSTED FOR NUCLEATED RBC'S   RBC 2.61 (L) 4.22 - 5.81 MIL/uL   Hemoglobin 7.8 (L) 13.0 - 17.0 g/dL   HCT 22.7 (L) 39.0 - 52.0 %   MCV 87.0 78.0 - 100.0 fL   MCH 29.9 26.0 - 34.0 pg   MCHC 34.4 30.0 - 36.0 g/dL   RDW 15.8 (H) 11.5 - 15.5 %   Platelets 19 (LL) 150 - 400 K/uL    Comment: REPEATED TO VERIFY SPECIMEN CHECKED FOR CLOTS PLATELET COUNT CONFIRMED BY SMEAR CRITICAL RESULT CALLED TO, READ BACK BY AND VERIFIED WITH: QUICK,M @ 1131 ON [id number] BY POTEAT,S    Neutrophils Relative % 78 %   Lymphocytes Relative 7 %   Monocytes Relative 12 %   Eosinophils Relative 2 %   Basophils Relative 1 %   nRBC 1 (H) 0 /100 WBC   Neutro Abs 16.3 (H) 1.7 - 7.7 K/uL   Lymphs Abs 1.5 0.7 - 4.0 K/uL   Monocytes Absolute 2.5 (H) 0.1 - 1.0  K/uL   Eosinophils Absolute 0.4 0.0 - 0.7 K/uL   Basophils Absolute 0.2 (H) 0.0 - 0.1 K/uL   RBC Morphology POLYCHROMASIA PRESENT     Comment: RARE NRBCs   WBC Morphology TOXIC GRANULATION     Comment: DOHLE BODIES MODERATE LEFT SHIFT (>5% METAS AND MYELOS,OCC PRO NOTED) Performed at [facility] [address], [place], [place] [address]   I-stat troponin, ED     Status: Abnormal   Collection Time: 11/01/17 10:58 AM  Result Value Ref Range   Troponin i, poc 0.27 (HH) 0.00 - 0.08 ng/mL   Comment NOTIFIED PHYSICIAN    Comment 3            Comment: Due to the release kinetics of cTnI, a negative result within the first hours of the onset of symptoms does not rule out myocardial infarction with certainty. If myocardial infarction is still suspected, repeat the test at appropriate intervals.   Troponin I     Status: Abnormal   Collection Time: 11/01/17  5:06 PM  Result Value Ref Range   Troponin I 0.76 (HH) <0.03 ng/mL    Comment: CRITICAL RESULT CALLED TO, READ BACK BY AND VERIFIED WITH: LEONARD,S. RN @1845  ON 02.19.19 BY COHEN,K Performed at [facility], [place] [address], [place], [place] [address]    Dg Chest 2 View  Result Date: 11/01/2017 CLINICAL DATA:  Shortness of breath.  History of lung cancer EXAM: CHEST  2 VIEW COMPARISON:  10/21/2017 FINDINGS: There is generalized interstitial and airspace opacity. Much of this opacity is from alveolar and lymphangitic tumor based on prior CT. There is newly increased density especially on the right which could reflect superimposed infection. No visible effusion or Kerley lines. Normal heart size and mediastinal contours. Generalized bony metastases. IMPRESSION: Interstitial and alveolar tumor bilaterally based on recent staging chest CT. There is superimposed acute opacity, especially on the right, which may reflect pneumonia or asymmetric edema. Electronically Signed   By: Monte Fantasia M.D.   On:  11/01/2017 11:31   Ct Angio Chest Pe W And/or Wo Contrast  Result Date: 11/01/2017 CLINICAL DATA:  PE, high pretest probability. Lung cancer with shortness of breath. EXAM: CT ANGIOGRAPHY  CHEST WITH CONTRAST TECHNIQUE: Multidetector CT imaging of the chest was performed using the standard protocol during bolus administration of intravenous contrast. Multiplanar CT image reconstructions and MIPs were obtained to evaluate the vascular anatomy. CONTRAST:  64m ISOVUE-370 IOPAMIDOL (ISOVUE-370) INJECTION 76% COMPARISON:  Chest CT 10/21/2017 FINDINGS: Cardiovascular: Normal heart size. Small pericardial effusion without nodularity or increased density. Porta catheter on the right with tip at the right atrium. There is satisfactory opacification of the pulmonary arterial tree but still significant limitation by patient motion. There is a interlobar pulmonary artery clot that is nonobstructive. More peripheral clot could be present but not seen due to the degree of motion. Given the small volume of clot there is no suspected right heart strain. Accuracy of RV to LV measurement is diminished by degree of cardiac motion. Mediastinum/Nodes: Negative for adenopathy Lungs/Pleura: There is generalized interstitial nodularity with masslike consolidation in the left lower lobe and patchy nodular densities in the right lung. There is superimposed asymmetric airspace disease in the right upper lung. Interlobular septal thickening seen bilaterally at the apex. Trace pleural fluid on the left, also seen previously. Upper Abdomen: No acute finding Musculoskeletal: Diffuse osteoblastic metastatic disease. No acute finding. Critical Value/emergent results were called by telephone at the time of interpretation on 11/01/2017 at 12:16 pm to Dr. NDavonna Belling, who verbally acknowledged these results. Review of the MIP images confirms the above findings. IMPRESSION: 1. Acute pulmonary embolism at the interlobar pulmonary artery. There  could be distal pulmonary emboli obscured by degree of motion artifact. 2. Acute airspace opacity asymmetric to the right. This is primarily concerning for infection in this patient with leukocytosis. Asymmetric edema or drug reaction are additional considerations. 3. Known metastatic lung cancer most recently staged 10/21/17. Electronically Signed   By: JMonte FantasiaM.D.   On: 11/01/2017 12:21    Pending Labs Unresulted Labs (From admission, onward)   Start     Ordered   11/02/17 0500  Comprehensive metabolic panel  Tomorrow morning,   R     11/01/17 1354   11/02/17 0500  CBC  Daily,   R     11/01/17 1354   11/02/17 0500  Protime-INR  Tomorrow morning,   R     11/01/17 1354   11/02/17 0500  APTT  Tomorrow morning,   R     11/01/17 1354   11/01/17 1354  Troponin I  Now then every 6 hours,   R     11/01/17 1354   11/01/17 1351  HIV antibody (Routine Testing)  Once,   R     11/01/17 1354      Vitals/Pain Today's Vitals   11/01/17 1500 11/01/17 1506 11/01/17 1600 11/01/17 1856  BP: 106/72  108/69 116/76  Pulse: 87  91 92  Resp: (!) 26  (!) 27 20  Temp:      TempSrc:      SpO2: 97% 100% 94% 94%  PainSc:        Isolation Precautions No active isolations  Medications Medications  iopamidol (ISOVUE-370) 76 % injection (not administered)  mirtazapine (REMERON) tablet 30 mg (not administered)  docusate sodium (COLACE) capsule 100 mg (not administered)  albuterol (PROVENTIL) (2.5 MG/3ML) 0.083% nebulizer solution 2.5 mg (not administered)  ALPRAZolam (XANAX) tablet 0.5 mg (not administered)  folic acid (FOLVITE) tablet 1 mg (not administered)  prochlorperazine (COMPAZINE) tablet 10 mg (not administered)  pantoprazole (PROTONIX) EC tablet 40 mg (not administered)  mometasone-formoterol (DULERA) 100-5 MCG/ACT inhaler 2 puff (  not administered)  albuterol (PROVENTIL) (2.5 MG/3ML) 0.083% nebulizer solution 2.5 mg (2.5 mg Nebulization Given 11/01/17 1502)  0.9 %  sodium chloride  infusion (not administered)  acetaminophen (TYLENOL) tablet 650 mg (not administered)    Or  acetaminophen (TYLENOL) suppository 650 mg (not administered)  HYDROcodone-acetaminophen (NORCO/VICODIN) 5-325 MG per tablet 1-2 tablet (not administered)  senna-docusate (Senokot-S) tablet 1 tablet (not administered)  bisacodyl (DULCOLAX) EC tablet 5 mg (not administered)  ondansetron (ZOFRAN) tablet 4 mg (not administered)    Or  ondansetron (ZOFRAN) injection 4 mg (not administered)  albuterol (PROVENTIL) (2.5 MG/3ML) 0.083% nebulizer solution (  Not Given 11/01/17 1502)  predniSONE (DELTASONE) tablet 30 mg (not administered)    Followed by  predniSONE (DELTASONE) tablet 20 mg (not administered)    Followed by  predniSONE (DELTASONE) tablet 10 mg (not administered)    Followed by  predniSONE (DELTASONE) tablet 5 mg (not administered)  albuterol (PROVENTIL) (2.5 MG/3ML) 0.083% nebulizer solution 5 mg (5 mg Nebulization Given 11/01/17 1036)  sodium chloride 0.9 % bolus 500 mL (0 mLs Intravenous Stopped 11/01/17 1140)  iopamidol (ISOVUE-370) 76 % injection 100 mL (67 mLs Intravenous Contrast Given 11/01/17 1149)  LORazepam (ATIVAN) injection 1 mg (1 mg Intravenous Given 11/01/17 1507)    Mobility non-ambulatory at this time due to PE/weakness

## 2017-11-01 NOTE — ED Notes (Signed)
MD notified of pt's platelet count of 19

## 2017-11-01 NOTE — ED Provider Notes (Signed)
[facility] DEPT Provider Note   CSN: [id number] Arrival date & time: 11/01/17  9326     History   Chief Complaint No chief complaint on file.   HPI Oscar Floyd is a 52 y.o. male.  HPI Patient has stage IV lung cancer.  Chronic dyspnea that is worsened over the last 3 weeks.  States it is been worse since his last chemotherapy.  Worse last few days.  Seen by pulmonology and had some adjustment of his breathing treatments recently but since then has had improvement but then worsened again.  It  is continued to worsen.  Has had a cough but that is really unchanged.  No fevers.  No swelling in his legs.  No chest pain.  States he has been feeling more weak all over also. Past Medical History:  Diagnosis Date  . Bone metastases ([place]) 08/26/2015  . Encounter for antineoplastic chemotherapy 01/06/2016  . Family history of cancer   . Hypertension   . Lung cancer ([place])    RUL lung with mets to liver, bone and brain    Patient Active Problem List   Diagnosis Date Noted  . Dyspnea 10/28/2017  . Pneumonitis   . Community acquired pneumonia 07/14/2017  . Bronchitis 05/26/2017  . Sinusitis 12/21/2016  . Genetic testing 07/07/2016  . Family history of cancer   . Sinus congestion 05/24/2016  . Pharyngitis 01/13/2016  . Encounter for antineoplastic chemotherapy 01/06/2016  . Bone metastases ([place]) 08/26/2015  . Weight loss, abnormal 07/04/2014  . Drug-induced diarrhea 07/04/2014  . Drug-induced skin rash 07/04/2014  . Paronychia of finger 07/04/2014  . 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 Non-small cell cancer of right lung ([place]) 03/25/2014  . Cough 03/11/2014  . Acid reflux 11/28/2013  . Blood pressure elevated 10/20/2012    Past Surgical History:  Procedure Laterality Date  . IR FLUORO GUIDE PORT INSERTION RIGHT  10/19/2017  . IR US GUIDE VASC ACCESS RIGHT  10/19/2017  . VASECTOMY    . VIDEO BRONCHOSCOPY Bilateral 03/20/2014   Procedure: VIDEO BRONCHOSCOPY WITH FLUORO;  Surgeon: Rigoberto Noel, MD;  Location: [facility];  Service: Cardiopulmonary;  Laterality: Bilateral;  . VIDEO BRONCHOSCOPY Bilateral 07/20/2017   Procedure: VIDEO BRONCHOSCOPY WITH FLUORO;  Surgeon: Rigoberto Noel, MD;  Location: [facility];  Service: Cardiopulmonary;  Laterality: Bilateral;       Home Medications    Prior to Admission medications   Medication Sig Start Date End Date Taking? Authorizing Provider  albuterol (PROVENTIL HFA;VENTOLIN HFA) 108 (90 Base) MCG/ACT inhaler Inhale 2 puffs into the lungs every 6 (six) hours as needed for wheezing or shortness of breath. 08/31/17  Yes Tanner, Lyndon Code., PA-C  albuterol (PROVENTIL) (2.5 MG/3ML) 0.083% nebulizer solution Take 3 mLs (2.5 mg total) by nebulization every 6 (six) hours as needed for wheezing or shortness of breath. Dx: J45.31 10/28/17  Yes Parrett, Fonnie Mu, NP  ALPRAZolam (XANAX) 1 MG tablet Take 0.5-1 mg by mouth. 04/16/14  Yes [provider]  budesonide-formoterol (SYMBICORT) 80-4.5 MCG/ACT inhaler Inhale 2 puffs into the lungs 2 (two) times daily. 10/28/17  Yes Parrett, Tammy S, NP  diazepam (VALIUM) 10 MG tablet Take 10 mg by mouth. When having a brain MRI 11/06/15  Yes [provider]  docusate sodium (COLACE) 100 MG capsule Take 100 mg by mouth daily.   Yes [provider]  esomeprazole (NEXIUM) 40 MG capsule Take 1 capsule (40 mg total) by mouth daily at 12 noon.  10/07/17  Yes Tanner, Lyndon Code., PA-C  Fluticasone Propionate, Inhal, 100 MCG/BLIST AEPB Inhale 2 puffs into the lungs 2 (two) times daily. 08/31/17  Yes Tanner, Lyndon Code., PA-C  folic acid (FOLVITE) 1 MG tablet Take 1 tablet (1 mg total) by mouth daily. 09/05/17  Yes [name], MD  HYDROcodone-acetaminophen (NORCO/VICODIN) 5-325 MG tablet Take 1 tablet by mouth every 6 (six) hours as needed for moderate pain. 10/07/17  Yes Tanner, Lyndon Code., PA-C  ibuprofen (ADVIL,MOTRIN) 800 MG tablet Take by  mouth. 11/28/13  Yes [provider]  MELATONIN PO Take 20 mg by mouth.   Yes [provider]  naproxen sodium (ANAPROX) 220 MG tablet Take 220 mg 2 (two) times daily as needed by mouth.    Yes [provider]  predniSONE (DELTASONE) 10 MG tablet 5 tab x 2 days, 4 tab x 2 days, 3 tab x 2 days, 2 tab x 2 days, 1 tab x 2 days, 1/2 tab x 2 days 10/26/17  Yes Tanner, Lyndon Code., PA-C  dexamethasone (DECADRON) 4 MG tablet 4 mg p.o. twice daily the day before, day of and day after chemotherapy every 3 weeks Patient not taking: Reported on 11/01/2017 09/05/17   [name], MD  Hydrocodone-Homatropine 5-1.5 MG TABS Take 1 tablet by mouth 4 (four) times daily as needed. Patient not taking: Reported on 10/28/2017 08/23/17   [name], MD  mirtazapine (REMERON) 30 MG tablet Take 1 tablet (30 mg total) by mouth at bedtime. Patient not taking: Reported on 10/26/2017 02/07/15   [name], MD  prochlorperazine (COMPAZINE) 10 MG tablet Take 1 tablet (10 mg total) by mouth every 6 (six) hours as needed for nausea or vomiting. Patient not taking: Reported on 10/28/2017 09/05/17   [name], MD    Family History Family History  Problem Relation Age of Onset  . Cancer Father 44       liposarcoma age 35  . Cancer Mother 75       gist  . Cancer Paternal Grandfather        lung cancer; heavy smoker; also had abdominal cancer in 34's  . Stroke Maternal Grandmother   . Heart disease Maternal Grandfather   . Cancer Other        multiple brothers of PGF with unknown cancer    Social History Social History   Tobacco Use  . Smoking status: Never Smoker  . Smokeless tobacco: Never Used  Substance Use Topics  . Alcohol use: Yes    Comment: socially  . Drug use: No     Allergies   Patient has no known allergies.   Review of Systems Review of Systems  Constitutional: Positive for fatigue. Negative for appetite change and fever.  HENT: Negative for  congestion.   Respiratory: Positive for cough and shortness of breath.   Cardiovascular: Negative for chest pain and leg swelling.  Gastrointestinal: Negative for abdominal distention.  Genitourinary: Negative for difficulty urinating.  Musculoskeletal: Negative for back pain.  Skin: Negative for rash.  Neurological: Positive for weakness.  Psychiatric/Behavioral: Negative for confusion.     Physical Exam Updated Vital Signs BP 113/73 (BP Location: Left Arm)   Pulse 82   Temp 98.3 F (36.8 C) (Oral)   Resp 20   SpO2 100%   Physical Exam  Constitutional: He appears well-developed.  Patient appears chronically ill.  HENT:  Head: Normocephalic.  Eyes: Pupils are equal, round, and reactive to light.  Neck: Neck supple.  Cardiovascular: Normal  rate.  Pulmonary/Chest: He has no wheezes. He has no rales.  Abdominal: There is no tenderness.  Musculoskeletal: He exhibits no edema.  Neurological: He is alert.  Skin: Skin is warm. Capillary refill takes less than 2 seconds.  Psychiatric: He has a normal mood and affect.     ED Treatments / Results  Labs (all labs ordered are listed, but only abnormal results are displayed) Labs Reviewed  COMPREHENSIVE METABOLIC PANEL - Abnormal; Notable for the following components:      Result Value   Calcium 7.9 (*)    Total Protein 5.8 (*)    Albumin 3.3 (*)    AST 45 (*)    Alkaline Phosphatase 811 (*)    All other components within normal limits  CBC WITH DIFFERENTIAL/PLATELET - Abnormal; Notable for the following components:   WBC 20.9 (*)    RBC 2.61 (*)    Hemoglobin 7.8 (*)    HCT 22.7 (*)    RDW 15.8 (*)    Platelets 19 (*)    nRBC 1 (*)    Neutro Abs 16.3 (*)    Monocytes Absolute 2.5 (*)    Basophils Absolute 0.2 (*)    All other components within normal limits  I-STAT TROPONIN, ED - Abnormal; Notable for the following components:   Troponin i, poc 0.27 (*)    All other components within normal limits    EKG  EKG  Interpretation  Date/Time:  Tuesday November 01 2017 10:27:40 EST Ventricular Rate:  85 PR Interval:    QRS Duration: 94 QT Interval:  361 QTC Calculation: 430 R Axis:   27 Text Interpretation:  Sinus rhythm Borderline short PR interval ST elev, probable normal early repol pattern Confirmed by Davonna Belling [phone]) on 11/01/2017 11:09:31 AM       Radiology Dg Chest 2 View  Result Date: 11/01/2017 CLINICAL DATA:  Shortness of breath.  History of lung cancer EXAM: CHEST  2 VIEW COMPARISON:  10/21/2017 FINDINGS: There is generalized interstitial and airspace opacity. Much of this opacity is from alveolar and lymphangitic tumor based on prior CT. There is newly increased density especially on the right which could reflect superimposed infection. No visible effusion or Kerley lines. Normal heart size and mediastinal contours. Generalized bony metastases. IMPRESSION: Interstitial and alveolar tumor bilaterally based on recent staging chest CT. There is superimposed acute opacity, especially on the right, which may reflect pneumonia or asymmetric edema. Electronically Signed   By: Monte Fantasia M.D.   On: 11/01/2017 11:31   Ct Angio Chest Pe W And/or Wo Contrast  Result Date: 11/01/2017 CLINICAL DATA:  PE, high pretest probability. Lung cancer with shortness of breath. EXAM: CT ANGIOGRAPHY CHEST WITH CONTRAST TECHNIQUE: Multidetector CT imaging of the chest was performed using the standard protocol during bolus administration of intravenous contrast. Multiplanar CT image reconstructions and MIPs were obtained to evaluate the vascular anatomy. CONTRAST:  42mL ISOVUE-370 IOPAMIDOL (ISOVUE-370) INJECTION 76% COMPARISON:  Chest CT 10/21/2017 FINDINGS: Cardiovascular: Normal heart size. Small pericardial effusion without nodularity or increased density. Porta catheter on the right with tip at the right atrium. There is satisfactory opacification of the pulmonary arterial tree but still significant  limitation by patient motion. There is a interlobar pulmonary artery clot that is nonobstructive. More peripheral clot could be present but not seen due to the degree of motion. Given the small volume of clot there is no suspected right heart strain. Accuracy of RV to LV measurement is diminished by degree of  cardiac motion. Mediastinum/Nodes: Negative for adenopathy Lungs/Pleura: There is generalized interstitial nodularity with masslike consolidation in the left lower lobe and patchy nodular densities in the right lung. There is superimposed asymmetric airspace disease in the right upper lung. Interlobular septal thickening seen bilaterally at the apex. Trace pleural fluid on the left, also seen previously. Upper Abdomen: No acute finding Musculoskeletal: Diffuse osteoblastic metastatic disease. No acute finding. Critical Value/emergent results were called by telephone at the time of interpretation on 11/01/2017 at 12:16 pm to Dr. Davonna Belling , who verbally acknowledged these results. Review of the MIP images confirms the above findings. IMPRESSION: 1. Acute pulmonary embolism at the interlobar pulmonary artery. There could be distal pulmonary emboli obscured by degree of motion artifact. 2. Acute airspace opacity asymmetric to the right. This is primarily concerning for infection in this patient with leukocytosis. Asymmetric edema or drug reaction are additional considerations. 3. Known metastatic lung cancer most recently staged 10/21/17. Electronically Signed   By: Monte Fantasia M.D.   On: 11/01/2017 12:21    Procedures Procedures (including critical care time)  Medications Ordered in ED Medications  iopamidol (ISOVUE-370) 76 % injection (not administered)  albuterol (PROVENTIL) (2.5 MG/3ML) 0.083% nebulizer solution 5 mg (5 mg Nebulization Given 11/01/17 1036)  sodium chloride 0.9 % bolus 500 mL (0 mLs Intravenous Stopped 11/01/17 1140)  iopamidol (ISOVUE-370) 76 % injection 100 mL (67 mLs  Intravenous Contrast Given 11/01/17 1149)     Initial Impression / Assessment and Plan / ED Course  I have reviewed the triage vital signs and the nursing notes.  Pertinent labs & imaging results that were available during my care of the patient were reviewed by me and considered in my medical decision making (see chart for details).     Patient presents with shortness of breath over the last 3 weeks.  Not much change in his cough..  History of lung cancer.  Has some pulmonary embolisms on CT scan but also potential pneumonia.  White count is elevated.  Last chemotherapy was around 2 weeks ago.  No bleeding although he is more anemic than previous.  He has a thrombocytopenia with platelets of 19.  Will treat for pneumonia and pulmonary embolism.  Will admit to hospitalist.  Final Clinical Impressions(s) / ED Diagnoses   Final diagnoses:  Other acute pulmonary embolism without acute cor pulmonale ([place])  Recurrent pneumonia  Metastatic cancer ([place])  Anemia, unspecified type  Thrombocytopenia [facility])    ED Discharge Orders    None       Davonna Belling, MD 11/01/17 1257

## 2017-11-01 NOTE — Telephone Encounter (Signed)
Oral Oncology Patient Advocate Encounter  A test claim processed at [facility] indicated that a prior authorization for Tarceva was required.    PA submitted via phone and subsequently approved Case ID: [id number] Dates of approval: 10/02/2017 through 10/31/2020  Oral Oncology Clinic will continue to follow.  Oscar Floyd. Melynda Keller, [place] Patient [place] [phone] 11/01/2017 12:41 PM

## 2017-11-01 NOTE — ED Triage Notes (Incomplete)
Patient coming from home with c/o SOB. Pt have stage 4 lung CA. Pt last treatment was a week ago at the cancer center. Patient having hard time breathing with any kind of exertion.

## 2017-11-01 NOTE — ED Notes (Signed)
Madina RN made aware critical Troponin 0.76.

## 2017-11-01 NOTE — Telephone Encounter (Signed)
Phoned patient's home. Patient's wife, [name], answered. Sarah sounds in distress. Inquired. She reports the ambulance is at her home now and preparing to take her husband to [facility] emergency room. She reports her husband isn't feeling well and struggling to breath. Explained I will notify Dr. Tammi Klippel and the team. She verbalized understanding and appreciation.

## 2017-11-01 NOTE — Telephone Encounter (Signed)
Oral Oncology Pharmacist Encounter  Received referral for possible new Tarceva (erlotinib) start for the treatment of metastatic NSCLC, EGFR mutation positive, progressed on multiple lines of therapy, possibly in conjunction with infusional Avastin, planned duration until disease progression or unacceptable toxicity.  Labs from Epic assessed, will continue to be monitored. Noted patient in ED today, pltc=19k, no anticoagulant agents noted on medication list yet, hoevver CT angio shows acute pulmonary embolism, being admitted for treatment of PE and possible pneumonia Alk Phos remains markedly elevated  Current medication list in Epic reviewed, major DDI with Tarceva and Nexium identified:  Tarceva solubility inversely related to gastric pH and prolonged acid suppression associated with PPI usage has been shown to decrease Tarceva Cmax and AUC ~50%  Patient recently started on Nexium for GERD. Patient can use H2RA (ranitidine, famotidine) with Tarceva given 2 hours before or 10 hours after H2RA administration. Tarceva administered with ~8oz soda can increase Tarceva Cmax and AUC 30% if PPI therapy cannot be discontinued.  This will discussed with patient if it is decided he will start on therapy.  Prior authorization has been approved. Test claim revealed copayment $50 for 1 month supply that can be reduced to $0 with copay card from [place]. Will need 24hr notice to have medication ordered at [facility]  Oral Oncology Clinic will continue to follow for possible prescription, initial counseling and start date.  Johny Drilling, PharmD, BCPS, BCOP 11/01/2017 12:10 PM Oral Oncology Clinic [phone]

## 2017-11-02 ENCOUNTER — Encounter: Payer: Self-pay | Admitting: *Deleted

## 2017-11-02 DIAGNOSIS — C3411 Malignant neoplasm of upper lobe, right bronchus or lung: Secondary | ICD-10-CM

## 2017-11-02 DIAGNOSIS — C7931 Secondary malignant neoplasm of brain: Secondary | ICD-10-CM

## 2017-11-02 DIAGNOSIS — C787 Secondary malignant neoplasm of liver and intrahepatic bile duct: Secondary | ICD-10-CM

## 2017-11-02 DIAGNOSIS — C7951 Secondary malignant neoplasm of bone: Secondary | ICD-10-CM

## 2017-11-02 DIAGNOSIS — D649 Anemia, unspecified: Secondary | ICD-10-CM

## 2017-11-02 DIAGNOSIS — I2699 Other pulmonary embolism without acute cor pulmonale: Principal | ICD-10-CM

## 2017-11-02 LAB — COMPREHENSIVE METABOLIC PANEL
ALT: 44 U/L (ref 17–63)
AST: 42 U/L — AB (ref 15–41)
Albumin: 2.9 g/dL — ABNORMAL LOW (ref 3.5–5.0)
Alkaline Phosphatase: 640 U/L — ABNORMAL HIGH (ref 38–126)
Anion gap: 8 (ref 5–15)
BILIRUBIN TOTAL: 0.7 mg/dL (ref 0.3–1.2)
BUN: 15 mg/dL (ref 6–20)
CALCIUM: 7.4 mg/dL — AB (ref 8.9–10.3)
CHLORIDE: 110 mmol/L (ref 101–111)
CO2: 22 mmol/L (ref 22–32)
CREATININE: 1.04 mg/dL (ref 0.61–1.24)
GFR calc non Af Amer: >60 mL/min (ref >60)
Glucose, Bld: 113 mg/dL — ABNORMAL HIGH (ref 65–99)
Potassium: 4.8 mmol/L (ref 3.5–5.1)
Sodium: 140 mmol/L (ref 135–145)
Total Protein: 5.6 g/dL — ABNORMAL LOW (ref 6.5–8.1)

## 2017-11-02 LAB — PROTIME-INR
INR: 1.03
PROTHROMBIN TIME: 13.5 s (ref 11.4–15.2)

## 2017-11-02 LAB — CBC
HCT: 15.1 % — ABNORMAL LOW (ref 39.0–52.0)
Hemoglobin: 5.1 g/dL — CL (ref 13.0–17.0)
MCH: 30 pg (ref 26.0–34.0)
MCHC: 33.8 g/dL (ref 30.0–36.0)
MCV: 88.8 fL (ref 78.0–100.0)
PLATELETS: 26 10*3/uL — AB (ref 150–400)
RBC: 1.7 MIL/uL — AB (ref 4.22–5.81)
RDW: 16.2 % — AB (ref 11.5–15.5)
WBC: 32.1 10*3/uL — AB (ref 4.0–10.5)

## 2017-11-02 LAB — PREPARE RBC (CROSSMATCH)

## 2017-11-02 LAB — HEMOGLOBIN AND HEMATOCRIT, BLOOD
HCT: 21.9 % — ABNORMAL LOW (ref 39.0–52.0)
HCT: 27.9 % — ABNORMAL LOW (ref 39.0–52.0)
HEMOGLOBIN: 7.3 g/dL — AB (ref 13.0–17.0)
Hemoglobin: 9.4 g/dL — ABNORMAL LOW (ref 13.0–17.0)

## 2017-11-02 LAB — APTT: APTT: 25 s (ref 24–36)

## 2017-11-02 LAB — HIV ANTIBODY (ROUTINE TESTING W REFLEX): HIV Screen 4th Generation wRfx: NONREACTIVE

## 2017-11-02 LAB — TROPONIN I: TROPONIN I: 0.89 ng/mL — AB (ref <0.03)

## 2017-11-02 LAB — ABO/RH: ABO/RH(D): O POS

## 2017-11-02 MED ORDER — DIPHENHYDRAMINE HCL 25 MG PO CAPS
25.0000 mg | ORAL_CAPSULE | Freq: Once | ORAL | Status: AC
  Administered 2017-11-02: 25 mg via ORAL
  Filled 2017-11-02: qty 1

## 2017-11-02 MED ORDER — SODIUM CHLORIDE 0.9 % IV SOLN
Freq: Once | INTRAVENOUS | Status: AC
  Administered 2017-11-02: 11:00:00 via INTRAVENOUS

## 2017-11-02 MED ORDER — ACETAMINOPHEN 325 MG PO TABS
650.0000 mg | ORAL_TABLET | Freq: Once | ORAL | Status: AC
  Administered 2017-11-02: 650 mg via ORAL
  Filled 2017-11-02: qty 2

## 2017-11-02 NOTE — Progress Notes (Signed)
[facility] PROGRESS NOTE  Oscar Floyd DZH:[id number] DOB: 10-28-1965 DOA: 11/01/2017  PCP: [name], MD  Brief History/Interval Summary: 52 year old Caucasian male with a past medical history of [name] on chemotherapy, status post radiation to brain in 2015, thrombocytopenia presented with shortness of breath.  Evaluation revealed acute pulmonary embolism.  Due to his severe thrombocytopenia anticoagulation was not initiated after discussions with patient's oncologist as well as pulmonology.  Reason for Visit: Acute pulmonary embolism  Consultants: Oncology.  Phone discussion with pulmonology.  Procedures: None  Antibiotics: None  Subjective/Interval History: Patient mentions that he still feels short of breath.  Denies any chest pain.  No nausea vomiting.  ROS: Denies any headaches.  Objective:  Vital Signs  Vitals:   11/02/17 0800 11/02/17 0809 11/02/17 1112 11/02/17 1126  BP: 114/69  120/76 113/78  Pulse: 89  98 91  Resp: 20  20 20   Temp: 98.8 F (37.1 C)  97.9 F (36.6 C) 97.7 F (36.5 C)  TempSrc: Oral  Oral Oral  SpO2: 98% 93% 96% 100%  Weight:      Height:        Intake/Output Summary (Last 24 hours) at 11/02/2017 1223 Last data filed at 11/02/2017 0900 Gross per 24 hour  Intake 957.5 ml  Output 1600 ml  Net -642.5 ml   Filed Weights   11/01/17 2039 11/02/17 0627  Weight: 91.6 kg (201 lb 15.1 oz) 92.4 kg (203 lb 11.3 oz)    General appearance: alert, cooperative, appears stated age and no distress Head: Normocephalic, without obvious abnormality, atraumatic Resp: Diminished air entry bilaterally especially at the bases.  No rhonchi.  Few scattered wheezes and crackles at the bases. Cardio: regular rate and rhythm, S1, S2 normal, no murmur, click, rub or gallop GI: soft, non-tender; bowel sounds normal; no masses,  no organomegaly Extremities: extremities normal, atraumatic, no cyanosis or edema Pulses: 2+ and  symmetric Neurologic: No obvious focal neurological deficits  Lab Results:  Data Reviewed: I have personally reviewed following labs and imaging studies  CBC: Recent Labs  Lab 10/26/17 1444 11/01/17 1043 11/02/17 0500 11/02/17 0524  WBC 4.0 20.9* 32.1*  --   NEUTROABS 3.1 16.3*  --   --   HGB  --  7.8* 5.1* 7.3*  HCT 31.0* 22.7* 15.1* 21.9*  MCV 86.1 87.0 88.8  --   PLT 25* 19* 26*  --     Basic Metabolic Panel: Recent Labs  Lab 10/26/17 1444 11/01/17 1043 11/02/17 0500  NA 137 140 140  K 3.9 4.0 4.8  CL 103 106 110  CO2 22 23 22   GLUCOSE 124 98 113*  BUN 17 16 15   CREATININE 1.08 1.08 1.04  CALCIUM 7.8* 7.9* 7.4*    GFR: Estimated Creatinine Clearance: 99.9 mL/min (by C-G formula based on SCr of 1.04 mg/dL).  Liver Function Tests: Recent Labs  Lab 10/26/17 1444 11/01/17 1043 11/02/17 0500  AST 43* 45* 42*  ALT 45 50 44  ALKPHOS 885* 811* 640*  BILITOT 0.9 0.6 0.7  PROT 6.4 5.8* 5.6*  ALBUMIN 3.3* 3.3* 2.9*    Coagulation Profile: Recent Labs  Lab 11/02/17 0500  INR 1.03    Cardiac Enzymes: Recent Labs  Lab 11/01/17 1706 11/01/17 2257 11/02/17 0500  TROPONINI 0.76* 0.96* 0.89*     Radiology Studies: Dg Chest 2 View  Result Date: 11/01/2017 CLINICAL DATA:  Shortness of breath.  History of lung cancer EXAM: CHEST  2 VIEW COMPARISON:  10/21/2017 FINDINGS: There is generalized interstitial and airspace opacity. Much of this opacity is from alveolar and lymphangitic tumor based on prior CT. There is newly increased density especially on the right which could reflect superimposed infection. No visible effusion or Kerley lines. Normal heart size and mediastinal contours. Generalized bony metastases. IMPRESSION: Interstitial and alveolar tumor bilaterally based on recent staging chest CT. There is superimposed acute opacity, especially on the right, which may reflect pneumonia or asymmetric edema. Electronically Signed   By: Monte Fantasia M.D.    On: 11/01/2017 11:31   Ct Angio Chest Pe W And/or Wo Contrast  Result Date: 11/01/2017 CLINICAL DATA:  PE, high pretest probability. Lung cancer with shortness of breath. EXAM: CT ANGIOGRAPHY CHEST WITH CONTRAST TECHNIQUE: Multidetector CT imaging of the chest was performed using the standard protocol during bolus administration of intravenous contrast. Multiplanar CT image reconstructions and MIPs were obtained to evaluate the vascular anatomy. CONTRAST:  39mL ISOVUE-370 IOPAMIDOL (ISOVUE-370) INJECTION 76% COMPARISON:  Chest CT 10/21/2017 FINDINGS: Cardiovascular: Normal heart size. Small pericardial effusion without nodularity or increased density. Porta catheter on the right with tip at the right atrium. There is satisfactory opacification of the pulmonary arterial tree but still significant limitation by patient motion. There is a interlobar pulmonary artery clot that is nonobstructive. More peripheral clot could be present but not seen due to the degree of motion. Given the small volume of clot there is no suspected right heart strain. Accuracy of RV to LV measurement is diminished by degree of cardiac motion. Mediastinum/Nodes: Negative for adenopathy Lungs/Pleura: There is generalized interstitial nodularity with masslike consolidation in the left lower lobe and patchy nodular densities in the right lung. There is superimposed asymmetric airspace disease in the right upper lung. Interlobular septal thickening seen bilaterally at the apex. Trace pleural fluid on the left, also seen previously. Upper Abdomen: No acute finding Musculoskeletal: Diffuse osteoblastic metastatic disease. No acute finding. Critical Value/emergent results were called by telephone at the time of interpretation on 11/01/2017 at 12:16 pm to Dr. Davonna Belling , who verbally acknowledged these results. Review of the MIP images confirms the above findings. IMPRESSION: 1. Acute pulmonary embolism at the interlobar pulmonary artery.  There could be distal pulmonary emboli obscured by degree of motion artifact. 2. Acute airspace opacity asymmetric to the right. This is primarily concerning for infection in this patient with leukocytosis. Asymmetric edema or drug reaction are additional considerations. 3. Known metastatic lung cancer most recently staged 10/21/17. Electronically Signed   By: Monte Fantasia M.D.   On: 11/01/2017 12:21     Medications:  Scheduled: . albuterol  2.5 mg Nebulization TID  . docusate sodium  100 mg Oral Daily  . feeding supplement (ENSURE ENLIVE)  237 mL Oral BID BM  . folic acid  1 mg Oral Daily  . mirtazapine  30 mg Oral QHS  . mometasone-formoterol  2 puff Inhalation BID  . pantoprazole  40 mg Oral Daily  . predniSONE  20 mg Oral Q breakfast   Followed by  . [START ON 11/04/2017] predniSONE  10 mg Oral Q breakfast   Followed by  . [START ON 11/06/2017] predniSONE  5 mg Oral Q breakfast   Continuous: . sodium chloride 75 mL/hr at 11/02/17 1448   JEH:UDJSHFWYOVZCH **OR** acetaminophen, albuterol, ALPRAZolam, bisacodyl, HYDROcodone-acetaminophen, ondansetron **OR** ondansetron (ZOFRAN) IV, prochlorperazine, senna-docusate, sodium chloride flush  Assessment/Plan:  Active Problems:   Pulmonary embolism (HCC)    Acute pulmonary embolism Patient with history of malignancy  and likely has hypercoagulable state as a result.  Presented with shortness of breath and CT angiogram suggested pulmonary embolism.  However due to severe thrombocytopenia risk of bleeding thought to be quite high.  Oncology has recommended holding off on anticoagulation until his platelet counts become greater than 50,000.  Continue to monitor closely.  He remains hemodynamically stable.  Mildly elevated troponin most likely due to pulmonary embolism.  Patient denies any chest pain.  Continue supplemental oxygen.  Elevated troponin Most likely due to pulmonary embolism.  He is chest pain-free currently.  EKG did not show  any acute changes.  Normocytic anemia No evidence of overt bleeding.  Hemoglobin is stable.  Oncology plans to transfuse 2 units.  Thrombocytopenia Probably due to chemotherapy.  Await improvement in platelet counts.  No evidence for bleeding.  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  Status post brain Radiation in 2015.  Plan is for referral to [facility] for second opinion.  Noted to be on steroids which will be continued.  DVT Prophylaxis: SCDs    Code Status: Full Code Family Communication: Discussed with the patient Disposition Plan: Management as outlined above.    LOS: 1 day   [place] [facility] Pager [phone] 11/02/2017, 12:23 PM  If 7PM-7AM, please contact night-coverage at [email or web address], password [facility]

## 2017-11-02 NOTE — Progress Notes (Signed)
CRITICAL VALUE ALERT  Critical Value: HGB 5.1  Date & Time Notied:  11/02/17 @ 0521  Provider Notified: Baltazar Najjar, NP  Orders Received/Actions taken: Recollect to rule out dilution.

## 2017-11-02 NOTE — Progress Notes (Signed)
DIAGNOSIS: stage IV (T2a, N0, M1b) non-small cell lung cancer consistent with poorly differentiated adenocarcinoma with positive EGFR mutation with deletion in exon 19, diagnosed in July of 2015 and presented with right upper lobe lung mass in addition to extensive liver, brain and bone metastases. He had disease progression and development of EGFR T790M resistant mutation in April 2017. 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  PRIOR THERAPY: 1) Status post whole brain irradiation under the care Dr. Tammi Klippel completed 04/12/2014. 2) Gilotrif 40 mg po daily - therapy beginning 04/03/2014. Status post approximately 20 months of therapy discontinued secondary to disease progression and development of EGFR T790M resistant mutation.  3) palliative radiotherapy to the lytic lesion in the right clavicle. 4) Tagrisso 80 mg by mouth daily status post 19 months of treatment. First dose was given on 01/12/2016.  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  CURRENT THERAPY: 1) systemic chemotherapy with carboplatin for AC of 5, Alimta 500 mg/M2 and Keytruda 200 mg IV every 3 weeks.  First dose September 09, 2017.  Status post 2 cycles. 2) Xgeva 120 g subcutaneously on monthly basis.  Subjective: The patient is seen and examined today.  He continues to complain of increasing fatigue and weakness as well as shortness of breath with minimal exertion.  He was admitted yesterday for evaluation of the worsening dyspnea and CT angiogram of the chest showed acute pulmonary embolism at the enter lobar pulmonary artery with questionable distal pulmonary emboli obscured by the degree of motion artifact.  The scan showed no concerning findings for disease progression but there was acute airspace opacity as symmetric to the right concerning for infection versus asymmetric edema versus drug reaction.  He has no fever  or chills.  He has no nausea or vomiting.  Objective: Vital signs in last 24 hours: Temp:  [98.1 F (36.7 C)-98.8 F (37.1 C)] 98.8 F (37.1 C) (02/20 0800) Pulse Rate:  [82-100] 89 (02/20 0800) Resp:  [16-28] 20 (02/20 0800) BP: (106-140)/(68-76) 114/69 (02/20 0800) SpO2:  [93 %-100 %] 93 % (02/20 0809) Weight:  [201 lb 15.1 oz (91.6 kg)-203 lb 11.3 oz (92.4 kg)] 203 lb 11.3 oz (92.4 kg) (02/20 0627)  Intake/Output from previous day: 02/19 0701 - 02/20 0700 In: 1457.5 [P.O.:360; I.V.:597.5; IV Piggyback:500] Out: 775 [Urine:775] Intake/Output this shift: Total I/O In: -  Out: 275 [Urine:275]  General appearance: alert, cooperative, fatigued and mild distress Resp: rales bilaterally Cardio: regular rate and rhythm, S1, S2 normal, no murmur, click, rub or gallop GI: soft, non-tender; bowel sounds normal; no masses,  no organomegaly Extremities: extremities normal, atraumatic, no cyanosis or edema  Lab Results:  Recent Labs    11/01/17 1043 11/02/17 0500 11/02/17 0524  WBC 20.9* 32.1*  --   HGB 7.8* 5.1* 7.3*  HCT 22.7* 15.1* 21.9*  PLT 19* 26*  --    BMET Recent Labs    11/01/17 1043 11/02/17 0500  NA 140 140  K 4.0 4.8  CL 106 110  CO2 23 22  GLUCOSE 98 113*  BUN 16 15  CREATININE 1.08 1.04  CALCIUM 7.9* 7.4*    Studies/Results: Dg Chest 2 View  Result Date: 11/01/2017 CLINICAL DATA:  Shortness of breath.  History of lung cancer EXAM: CHEST  2 VIEW COMPARISON:  10/21/2017 FINDINGS: There is generalized interstitial and airspace opacity. Much of this opacity is from alveolar and lymphangitic tumor based on prior CT.  There is newly increased density especially on the right which could reflect superimposed infection. No visible effusion or Kerley lines. Normal heart size and mediastinal contours. Generalized bony metastases. IMPRESSION: Interstitial and alveolar tumor bilaterally based on recent staging chest CT. There is superimposed acute opacity, especially  on the right, which may reflect pneumonia or asymmetric edema. Electronically Signed   By: Monte Fantasia M.D.   On: 11/01/2017 11:31   Ct Angio Chest Pe W And/or Wo Contrast  Result Date: 11/01/2017 CLINICAL DATA:  PE, high pretest probability. Lung cancer with shortness of breath. EXAM: CT ANGIOGRAPHY CHEST WITH CONTRAST TECHNIQUE: Multidetector CT imaging of the chest was performed using the standard protocol during bolus administration of intravenous contrast. Multiplanar CT image reconstructions and MIPs were obtained to evaluate the vascular anatomy. CONTRAST:  29m ISOVUE-370 IOPAMIDOL (ISOVUE-370) INJECTION 76% COMPARISON:  Chest CT 10/21/2017 FINDINGS: Cardiovascular: Normal heart size. Small pericardial effusion without nodularity or increased density. Porta catheter on the right with tip at the right atrium. There is satisfactory opacification of the pulmonary arterial tree but still significant limitation by patient motion. There is a interlobar pulmonary artery clot that is nonobstructive. More peripheral clot could be present but not seen due to the degree of motion. Given the small volume of clot there is no suspected right heart strain. Accuracy of RV to LV measurement is diminished by degree of cardiac motion. Mediastinum/Nodes: Negative for adenopathy Lungs/Pleura: There is generalized interstitial nodularity with masslike consolidation in the left lower lobe and patchy nodular densities in the right lung. There is superimposed asymmetric airspace disease in the right upper lung. Interlobular septal thickening seen bilaterally at the apex. Trace pleural fluid on the left, also seen previously. Upper Abdomen: No acute finding Musculoskeletal: Diffuse osteoblastic metastatic disease. No acute finding. Critical Value/emergent results were called by telephone at the time of interpretation on 11/01/2017 at 12:16 pm to Dr. NDavonna Belling, who verbally acknowledged these results. Review of the MIP  images confirms the above findings. IMPRESSION: 1. Acute pulmonary embolism at the interlobar pulmonary artery. There could be distal pulmonary emboli obscured by degree of motion artifact. 2. Acute airspace opacity asymmetric to the right. This is primarily concerning for infection in this patient with leukocytosis. Asymmetric edema or drug reaction are additional considerations. 3. Known metastatic lung cancer most recently staged 10/21/17. Electronically Signed   By: JMonte FantasiaM.D.   On: 11/01/2017 12:21    Medications: I have reviewed the patient's current medications.  Assessment/Plan: This is a very pleasant 52years old white male with metastatic non-small cell lung cancer, adenocarcinoma with positive EGFR mutation with deletion in exon 127in July 2015.  He is status post several treatment with targeted therapy including GILOTRIF as well as Tagrisso. Unfortunately the patient had evidence for disease progression recently with development of another EGFR resistant mutation, C797S. He was a started on systemic chemotherapy with carboplatin, Alimta and Keytruda. He continues to have significant shortness of breath.  Most recent imaging studies including CT scan of the chest, abdomen and pelvis last week showed no concerning for disease progression. The patient had a repeat CT angiogram of the chest yesterday that showed the evidence for pulmonary embolism in addition to airspace disease questionable for pneumonitis versus infection. I had a lengthy discussion with the patient today.  He is not tolerating the systemic chemotherapy fairly well with significant pancytopenia and questionable immunotherapy pneumonitis.  We will continue the patient on a taper dose of prednisone.  I may consider him for treatment with first generation EGFR tyrosine kinase inhibitor like Tarceva in addition to Avastin. For the pulmonary embolism, we will consider starting the patient on subcutaneous Lovenox once his  platelets count is over 50,000. For the chemotherapy-induced anemia, I will arrange for the patient to receive 2 units of PRBCs transfusion. For pain management, continue Vicodin. Thank you so much for taking good care of Mr. Menter, I would continue to follow-up the patient with you and assist in his management on as-needed basis.   LOS: 1 day    Eilleen Kempf 11/02/2017

## 2017-11-02 NOTE — Progress Notes (Signed)
Oscar Najjar, NP notified of increase in troponin. Pt in no distress at this time, NSR on telemetry, no c/o pain. Will continue to monitor.

## 2017-11-02 NOTE — Plan of Care (Signed)
  Education: Knowledge of General Education information will improve 11/02/2017 2156 - Progressing by Ashley Murrain, RN   Activity: Risk for activity intolerance will decrease 11/02/2017 2156 - Progressing by Ashley Murrain, RN   Safety: Ability to remain free from injury will improve 11/02/2017 2156 - Progressing by [employer], Sherryll Burger, RN

## 2017-11-02 NOTE — Progress Notes (Signed)
Oncology Nurse Navigator Documentation  Oncology Nurse Navigator Flowsheets 11/02/2017  Navigator Location [facility]  Navigator Encounter Type Other/I spoke with patient this am in the hospital.  I helped explain next steps with current treatment.   I also spoke to him and his wife yesterday in the ED.  I updated them on treatment plan.   Patient Visit Type Inpatient  Treatment Phase Treatment  Barriers/Navigation Needs Education  Education Other  Interventions Education  Education Method Verbal  Acuity Level 2  Time Spent with Patient 30

## 2017-11-02 NOTE — Progress Notes (Signed)
RN paged rising troponin to NP. NP reviewed chart. Per chart, anticoagulation for PE/troponin is being held due to pt's thrombocytopenia. Attending discussed with oncology and critical care docs. Goal plts are over 50000, now 19000. Pt is having no chest pain, is NSR on tele. Rising troponin is likely due to demand ischemia associated with PE. Will continue to monitor and trend platelets.  KJKG, NP [facility]

## 2017-11-03 ENCOUNTER — Inpatient Hospital Stay: Payer: BLUE CROSS/BLUE SHIELD

## 2017-11-03 ENCOUNTER — Encounter: Payer: Self-pay | Admitting: *Deleted

## 2017-11-03 DIAGNOSIS — T451X5A Adverse effect of antineoplastic and immunosuppressive drugs, initial encounter: Secondary | ICD-10-CM

## 2017-11-03 DIAGNOSIS — D6481 Anemia due to antineoplastic chemotherapy: Secondary | ICD-10-CM

## 2017-11-03 LAB — TYPE AND SCREEN
ABO/RH(D): O POS
ANTIBODY SCREEN: NEGATIVE
UNIT DIVISION: 0
Unit division: 0

## 2017-11-03 LAB — BPAM RBC
BLOOD PRODUCT EXPIRATION DATE: 201903212359
BLOOD PRODUCT EXPIRATION DATE: 201903212359
ISSUE DATE / TIME: 201902201049
ISSUE DATE / TIME: 201902201324
UNIT TYPE AND RH: 5100
Unit Type and Rh: 5100

## 2017-11-03 LAB — CBC
HCT: 27.4 % — ABNORMAL LOW (ref 39.0–52.0)
Hemoglobin: 9.4 g/dL — ABNORMAL LOW (ref 13.0–17.0)
MCH: 30.9 pg (ref 26.0–34.0)
MCHC: 34.3 g/dL (ref 30.0–36.0)
MCV: 90.1 fL (ref 78.0–100.0)
PLATELETS: 26 10*3/uL — AB (ref 150–400)
RBC: 3.04 MIL/uL — ABNORMAL LOW (ref 4.22–5.81)
RDW: 15.6 % — AB (ref 11.5–15.5)
WBC: 30.2 10*3/uL — AB (ref 4.0–10.5)

## 2017-11-03 MED ORDER — PREMIER PROTEIN SHAKE
11.0000 [oz_av] | ORAL | Status: DC
  Administered 2017-11-05 – 2017-11-13 (×7): 11 [oz_av] via ORAL
  Filled 2017-11-03 (×13): qty 325.31

## 2017-11-03 MED ORDER — MORPHINE SULFATE (PF) 4 MG/ML IV SOLN
2.0000 mg | INTRAVENOUS | Status: DC | PRN
  Administered 2017-11-04 – 2017-11-06 (×2): 2 mg via INTRAVENOUS
  Filled 2017-11-03 (×2): qty 1

## 2017-11-03 NOTE — Progress Notes (Signed)
RN in room with patient, patient c/o right sided chest pain rated at a 5 that he feels is due to coughing all through the night.  RN administered Xanax and Norco which were prn orders as MD contacted regarding this pain.  Patient is clearly in no distress, oxygen saturation 97% on 3 liters.  MD returned nurses page, he plans to come and evaluate patient.  No new orders received at this time.

## 2017-11-03 NOTE — Progress Notes (Signed)
Oncology Nurse Navigator Documentation  Oncology Nurse Navigator Flowsheets 11/03/2017  Navigator Location [facility]  Navigator Encounter Type Other/spoke with patient today. He is complaining of chest discomfort.  His nurse is at the bedside contacting doctor about chest discomfort.  I called ICU charge RN/[facility] to see if they can check on Oscar Floyd. They will check on patient. I updated Dr. Julien Nordmann.   Patient Visit Type Inpatient  Treatment Phase Treatment  Barriers/Navigation Needs Coordination of Care;Education  Education Other  Interventions Coordination of Care;Education  Coordination of Care Other  Education Method Verbal  Acuity Level 3  Time Spent with Patient 33

## 2017-11-03 NOTE — Progress Notes (Signed)
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  Evaluation revealed acute pulmonary embolism.  Due to his severe thrombocytopenia anticoagulation was not initiated after discussions with patient's oncologist as well as pulmonology.  Reason for Visit: Acute pulmonary embolism  Consultants: Oncology.  Phone discussion with pulmonology.  Procedures: None  Antibiotics: None  Subjective/Interval History: Patient complains of right-sided chest pain.  Patient was given pain medications with improvement in symptoms.  Shortness of breath about the same, no worse.  Denies any dizziness or lightheadedness.    ROS: Denies any nausea or vomiting.  Objective:  Vital Signs  Vitals:   11/02/17 2120 11/02/17 2123 11/03/17 0557 11/03/17 0910  BP:   124/83   Pulse:   82   Resp:   20   Temp:   98.3 F (36.8 C)   TempSrc:   Oral   SpO2: 91% 91% 95% 95%  Weight:   91.2 kg (201 lb 1 oz)   Height:        Intake/Output Summary (Last 24 hours) at 11/03/2017 1005 Last data filed at 11/03/2017 0659 Gross per 24 hour  Intake 3347 ml  Output 1000 ml  Net 2347 ml   Filed Weights   11/01/17 2039 11/02/17 0627 11/03/17 0557  Weight: 91.6 kg (201 lb 15.1 oz) 92.4 kg (203 lb 11.3 oz) 91.2 kg (201 lb 1 oz)    General appearance: Awake alert.  In no distress. Resp: Diminished air entry at the bases but no wheezing rales or rhonchi appreciated.  Mildly tachypneic at rest.  Cardio: S1-S2 is normal.  Regular.  No S3-S4.  No rubs murmurs or bruit. GI: Soft.  Nontender nondistended.  No masses organomegaly. Extremities: No significant edema Neurologic: No obvious focal neurological deficits  Lab  Results:  Data Reviewed: I have personally reviewed following labs and imaging studies  CBC: Recent Labs  Lab 11/01/17 1043 11/02/17 0500 11/02/17 0524 11/02/17 1804 11/03/17 0400  WBC 20.9* 32.1*  --   --  30.2*  NEUTROABS 16.3*  --   --   --   --   HGB 7.8* 5.1* 7.3* 9.4* 9.4*  HCT 22.7* 15.1* 21.9* 27.9* 27.4*  MCV 87.0 88.8  --   --  90.1  PLT 19* 26*  --   --  26*    Basic Metabolic Panel: Recent Labs  Lab 11/01/17 1043 11/02/17 0500  NA 140 140  K 4.0 4.8  CL 106 110  CO2 23 22  GLUCOSE 98 113*  BUN 16 15  CREATININE 1.08 1.04  CALCIUM 7.9* 7.4*    GFR: Estimated Creatinine Clearance: 99.9 mL/min (by C-G formula based on SCr of 1.04 mg/dL).  Liver Function Tests: Recent Labs  Lab 11/01/17 1043 11/02/17 0500  AST 45* 42*  ALT 50 44  ALKPHOS 811* 640*  BILITOT 0.6 0.7  PROT 5.8* 5.6*  ALBUMIN 3.3* 2.9*    Coagulation Profile: Recent Labs  Lab 11/02/17 0500  INR 1.03    Cardiac Enzymes: Recent Labs  Lab 11/01/17 1706 11/01/17 2257 11/02/17 0500  TROPONINI 0.76* 0.96* 0.89*     Radiology Studies: Dg Chest 2 View  Result Date: 11/01/2017 CLINICAL DATA:  Shortness of breath.  History of lung cancer EXAM: CHEST  2 VIEW COMPARISON:  10/21/2017 FINDINGS: There is generalized interstitial and airspace opacity. Much of this opacity is from alveolar and lymphangitic tumor based on prior CT. There is newly increased density especially on the right which could reflect superimposed infection. No visible effusion or Kerley lines. Normal heart size and mediastinal contours. Generalized bony metastases. IMPRESSION: Interstitial and alveolar tumor bilaterally based on recent staging chest CT. There is superimposed acute opacity, especially on the right, which may reflect pneumonia or asymmetric edema. Electronically Signed   By: Monte Fantasia M.D.   On: 11/01/2017 11:31   Ct Angio Chest Pe W And/or Wo Contrast  Result Date: 11/01/2017 CLINICAL DATA:   PE, high pretest probability. Lung cancer with shortness of breath. EXAM: CT ANGIOGRAPHY CHEST WITH CONTRAST TECHNIQUE: Multidetector CT imaging of the chest was performed using the standard protocol during bolus administration of intravenous contrast. Multiplanar CT image reconstructions and MIPs were obtained to evaluate the vascular anatomy. CONTRAST:  75mL ISOVUE-370 IOPAMIDOL (ISOVUE-370) INJECTION 76% COMPARISON:  Chest CT 10/21/2017 FINDINGS: Cardiovascular: Normal heart size. Small pericardial effusion without nodularity or increased density. Porta catheter on the right with tip at the right atrium. There is satisfactory opacification of the pulmonary arterial tree but still significant limitation by patient motion. There is a interlobar pulmonary artery clot that is nonobstructive. More peripheral clot could be present but not seen due to the degree of motion. Given the small volume of clot there is no suspected right heart strain. Accuracy of RV to LV measurement is diminished by degree of cardiac motion. Mediastinum/Nodes: Negative for adenopathy Lungs/Pleura: There is generalized interstitial nodularity with masslike consolidation in the left lower lobe and patchy nodular densities in the right lung. There is superimposed asymmetric airspace disease in the right upper lung. Interlobular septal thickening seen bilaterally at the apex. Trace pleural fluid on the left, also seen previously. Upper Abdomen: No acute finding Musculoskeletal: Diffuse osteoblastic metastatic disease. No acute finding. Critical Value/emergent results were called by telephone at the time of interpretation on 11/01/2017 at 12:16 pm to Dr. Davonna Belling , who verbally acknowledged these results. Review of the MIP images confirms the above findings. IMPRESSION: 1. Acute pulmonary embolism at the interlobar pulmonary artery. There could be distal pulmonary emboli obscured by degree of motion artifact. 2. Acute airspace opacity  asymmetric to the right. This is primarily concerning for infection in this patient with leukocytosis. Asymmetric edema or drug reaction are additional considerations. 3. Known metastatic lung cancer most recently staged 10/21/17. Electronically Signed   By: Monte Fantasia M.D.   On: 11/01/2017 12:21     Medications:  Scheduled: . albuterol  2.5 mg Nebulization TID  . docusate sodium  100 mg Oral Daily  . feeding supplement (ENSURE ENLIVE)  237 mL Oral BID BM  . folic acid  1 mg Oral Daily  . mirtazapine  30 mg Oral QHS  . mometasone-formoterol  2 puff Inhalation BID  . pantoprazole  40 mg Oral Daily  . [START ON 11/04/2017] predniSONE  10 mg Oral Q breakfast   Followed by  . [START ON 11/06/2017] predniSONE  5 mg Oral Q breakfast   Continuous: . sodium chloride 75 mL/hr at 11/03/17 1660   YTK:[id number] **OR** acetaminophen, albuterol, ALPRAZolam, bisacodyl, HYDROcodone-acetaminophen, morphine injection, ondansetron **OR** ondansetron (ZOFRAN) IV, prochlorperazine, senna-docusate, sodium chloride flush  Assessment/Plan:  Active Problems:   Pulmonary embolism (HCC)    Acute pulmonary embolism with hypoxia Patient with history of malignancy and likely has hypercoagulable state  as a result.  Presented with shortness of breath and CT angiogram suggested pulmonary embolism.  However due to severe thrombocytopenia risk of bleeding thought to be quite high.  Oncology has recommended holding off on anticoagulation until his platelet counts become greater than 50,000.  Chest pain most likely due to PE.  Treat with narcotics for now.  Elevated troponin most likely due to pulmonary embolism.  He does not have any evidence for ischemia.  Continue supplemental oxygen.  Wait for platelet counts to improve.  Once counts are greater than 50,000 oncology recommends Lovenox.    Elevated troponin Most likely due to pulmonary embolism.  He is chest pain-free currently.  EKG did not show any acute  changes.  Normocytic anemia No evidence of overt bleeding. Hemoglobin has improved posttransfusion.  Leukocytosis most likely to PEGfilgrastim given on 2/9.   Thrombocytopenia Probably due to chemotherapy.  Await improvement in platelet counts.  No evidence for bleeding.  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  Status post brain radiation in 2015.  Plan is for referral to [facility] for second opinion.  Noted to be on steroids which will be continued.  DVT Prophylaxis: SCDs    Code Status: Full Code Family Communication: Discussed with the patient Disposition Plan: Management as outlined above.  Mobilize as tolerated.    LOS: 2 days   [place] [facility] Pager [phone] 11/03/2017, 10:05 AM  If 7PM-7AM, please contact night-coverage at [email or web address], password [facility]

## 2017-11-03 NOTE — Progress Notes (Signed)
Initial Nutrition Assessment  DOCUMENTATION CODES:   (Will assess for malnutrition at follow-up)  INTERVENTION:  - Will order Premier Protein once/day, this supplement provides 160 kcal and 30 grams of protein.  - Continue to encourage PO intakes.   NUTRITION DIAGNOSIS:   Increased nutrient needs related to catabolic illness, cancer and cancer related treatments as evidenced by estimated needs.  GOAL:   Patient will meet greater than or equal to 90% of their needs  MONITOR:   PO intake, Supplement acceptance, Weight trends, Labs  REASON FOR ASSESSMENT:   Malnutrition Screening Tool  ASSESSMENT:   52 year old Caucasian male with a past medical history of [name] on chemotherapy, status post radiation to brain in 2015, thrombocytopenia presented with shortness of breath.  BMI indicates overweight status. No intakes documented since admission. Pt sleeping at time of visit and wife was at bedside; she provided all information. Pt did not eat well for dinner last night because the food did not taste good. He was able to eat 100% of pancakes and other breakfast items and a friend is bringing in lunch for pt and his wife.   Pt has had a good appetite now and did PTA. Despite this, he had not been eating as much as usual in the two weeks PTA d/t increasing SOB and difficulty breathing while trying to eat. His last chemo was on 2/7 and SOB has ben progressively worsening since that time. He has not had any taste changes while undergoing chemo.  His wife reports that he lost ~10 lbs since last chemo treatment. Per chart review, pt has lost 7 lbs (3.4% body weight) in the past 2 weeks. This is significant for time frame.   Medications reviewed; 100 mg Colace/day, 1 mg oral folic acid/day, prednisone taper. Labs reviewed; Ca: 7.4 mg/dL, Alk Phos elevated.  IVF: NS @ 75 mL/hr.        NUTRITION - FOCUSED PHYSICAL EXAM:  Unable to complete/assess at this  time.   Diet Order:  Diet regular Room service appropriate? Yes; Fluid consistency: Thin  EDUCATION NEEDS:   No education needs have been identified at this time  Skin:  Skin Assessment: Reviewed RN Assessment  Last BM:  2/20  Height:   Ht Readings from Last 1 Encounters:  11/01/17 _0  (1.88 m)    Weight:   Wt Readings from Last 1 Encounters:  11/03/17 201 lb 1 oz (91.2 kg)    Ideal Body Weight:  86.36 kg  BMI:  Body mass index is 25.81 kg/m.  Estimated Nutritional Needs:   Kcal:  2536-6440 (27-29 kcal/kg)  Protein:  130-140 grams  Fluid:  >/= 2.2 L/day      Jarome Matin, MS, RD, LDN, [facility] Inpatient Clinical Dietitian Pager # [phone] After hours/weekend pager # [phone]

## 2017-11-03 NOTE — Plan of Care (Signed)
Patient stable during 7 a to 7 p shift, no further complaints of chest pain after receiving Norco x 1 early in shift.  Wife and various family members at bedside.  Remains on bedrest at this time until can be anticoagulated.

## 2017-11-04 ENCOUNTER — Other Ambulatory Visit (HOSPITAL_COMMUNITY): Payer: BLUE CROSS/BLUE SHIELD

## 2017-11-04 ENCOUNTER — Other Ambulatory Visit: Payer: Self-pay

## 2017-11-04 ENCOUNTER — Encounter: Payer: Self-pay | Admitting: *Deleted

## 2017-11-04 DIAGNOSIS — R05 Cough: Secondary | ICD-10-CM

## 2017-11-04 LAB — CBC
HCT: 26.4 % — ABNORMAL LOW (ref 39.0–52.0)
Hemoglobin: 8.9 g/dL — ABNORMAL LOW (ref 13.0–17.0)
MCH: 30.9 pg (ref 26.0–34.0)
MCHC: 33.7 g/dL (ref 30.0–36.0)
MCV: 91.7 fL (ref 78.0–100.0)
Platelets: 27 10*3/uL — CL (ref 150–400)
RBC: 2.88 MIL/uL — ABNORMAL LOW (ref 4.22–5.81)
RDW: 16.2 % — AB (ref 11.5–15.5)
WBC: 22.3 10*3/uL — AB (ref 4.0–10.5)

## 2017-11-04 LAB — BASIC METABOLIC PANEL
ANION GAP: 11 (ref 5–15)
BUN: 14 mg/dL (ref 6–20)
CALCIUM: 7.4 mg/dL — AB (ref 8.9–10.3)
CO2: 21 mmol/L — ABNORMAL LOW (ref 22–32)
Chloride: 112 mmol/L — ABNORMAL HIGH (ref 101–111)
Creatinine, Ser: 1.09 mg/dL (ref 0.61–1.24)
Glucose, Bld: 96 mg/dL (ref 65–99)
Potassium: 3.9 mmol/L (ref 3.5–5.1)
SODIUM: 144 mmol/L (ref 135–145)

## 2017-11-04 MED ORDER — PREDNISONE 5 MG PO TABS: 10.0000 mg | ORAL_TABLET | Freq: Every day | ORAL | Status: DC

## 2017-11-04 MED ORDER — PREDNISONE 5 MG PO TABS: 5.0000 mg | ORAL_TABLET | Freq: Every day | ORAL | Status: DC

## 2017-11-04 MED ORDER — PREDNISONE 20 MG PO TABS: 80.0000 mg | ORAL_TABLET | Freq: Every day | ORAL | Status: DC

## 2017-11-04 MED ORDER — PREDNISONE 20 MG PO TABS
60.0000 mg | ORAL_TABLET | Freq: Every day | ORAL | Status: AC
  Administered 2017-11-09 – 2017-11-12 (×4): 60 mg via ORAL
  Filled 2017-11-04 (×4): qty 3

## 2017-11-04 MED ORDER — PREDNISONE 20 MG PO TABS: 20.0000 mg | ORAL_TABLET | Freq: Every day | ORAL | Status: DC

## 2017-11-04 MED ORDER — PREDNISONE 20 MG PO TABS
40.0000 mg | ORAL_TABLET | Freq: Every day | ORAL | Status: DC
  Administered 2017-11-13: 40 mg via ORAL
  Filled 2017-11-04: qty 2

## 2017-11-04 MED ORDER — HYDROCODONE-HOMATROPINE 5-1.5 MG/5ML PO SYRP
5.0000 mL | ORAL_SOLUTION | Freq: Four times a day (QID) | ORAL | Status: DC | PRN
  Administered 2017-11-04: 5 mL via ORAL
  Filled 2017-11-04: qty 5

## 2017-11-04 MED ORDER — PREDNISONE 20 MG PO TABS
80.0000 mg | ORAL_TABLET | Freq: Every day | ORAL | Status: AC
  Administered 2017-11-05 – 2017-11-08 (×4): 80 mg via ORAL
  Filled 2017-11-04 (×4): qty 4

## 2017-11-04 NOTE — Progress Notes (Signed)
Oncology Nurse Navigator Documentation  Oncology Nurse Navigator Flowsheets 11/04/2017  Navigator Location [facility]  Navigator Encounter Type Other/I spoke with patient today.  He is feeling about the same. I updated Dr. Julien Nordmann.   Patient Visit Type Inpatient  Treatment Phase Treatment  Barriers/Navigation Needs Education;Coordination of Care  Education Other  Interventions Coordination of Care;Education  Coordination of Care Other  Education Method Verbal  Acuity Level 2  Time Spent with Patient 30

## 2017-11-04 NOTE — Progress Notes (Signed)
Subjective: The patient is seen and examined today.  He continues to have significant shortness of breath with minimal exertion as well as dry cough.  He is hurting in the chest secondary to his persistent cough.  He denied having any current fever or chills.  He has no nausea, vomiting, diarrhea or constipation.  His platelets count are still low.  Objective: Vital signs in last 24 hours: Temp:  [97.8 F (36.6 C)-98.5 F (36.9 C)] 98.5 F (36.9 C) (02/22 0431) Pulse Rate:  [87-99] 87 (02/22 0431) Resp:  [20-26] 26 (02/22 0431) BP: (115-136)/(76-79) 136/78 (02/22 0431) SpO2:  [90 %-99 %] 94 % (02/22 0900) Weight:  [198 lb 13.7 oz (90.2 kg)] 198 lb 13.7 oz (90.2 kg) (02/22 0431)  Intake/Output from previous day: 02/21 0701 - 02/22 0700 In: 2806.3 [P.O.:1080; I.V.:1726.3] Out: 3025 [Urine:3025] Intake/Output this shift: Total I/O In: -  Out: 325 [Urine:325]  General appearance: alert, cooperative, fatigued and mild distress Resp: rales bilaterally Cardio: regular rate and rhythm, S1, S2 normal, no murmur, click, rub or gallop GI: soft, non-tender; bowel sounds normal; no masses,  no organomegaly Extremities: extremities normal, atraumatic, no cyanosis or edema  Lab Results:  Recent Labs    11/03/17 0400 11/04/17 0403  WBC 30.2* 22.3*  HGB 9.4* 8.9*  HCT 27.4* 26.4*  PLT 26* 27*   BMET Recent Labs    11/02/17 0500 11/04/17 0403  NA 140 144  K 4.8 3.9  CL 110 112*  CO2 22 21*  GLUCOSE 113* 96  BUN 15 14  CREATININE 1.04 1.09  CALCIUM 7.4* 7.4*    Studies/Results: No results found.  Medications: I have reviewed the patient's current medications.  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  He was treated with targeted therapy in the past and most recently on treatment with systemic chemotherapy  with carboplatin, Alimta and Keytruda status post 2 cycles.  His most recent CT scan showed no evidence for disease progression but unfortunately the patient continues to have significant dyspnea could be secondary to immunotherapy mediated pneumonitis in addition to the recently develop acute pulmonary embolism. The patient is not tolerating the systemic chemotherapy fairly well and he had significant anemia and thrombocytopenia.  I may consider him for treatment with a different targeted therapy including Tarceva and Avastin after discharge. 2) For the acute pulmonary embolism, will consider the patient for anticoagulation with Lovenox once his platelets count are over 50,000. 3) for the possibility of immunotherapy mediated pneumonitis, I will increase his dose of prednisone to 1 mg/KG on daily basis and this will be tapered slowly. 4) for the dry cough, I will start the patient on Hycodan 5 mL p.o. every 6 hours as needed. Thank you so much for taking good care of Oscar Floyd, I would continue to follow-up the patient with you and assist in his management on as-needed basis.  LOS: 3 days    Oscar Floyd 11/04/2017

## 2017-11-04 NOTE — Progress Notes (Signed)
[facility] PROGRESS NOTE  Oscar Floyd EXH:[id number] DOB: 08-24-1966 DOA: 11/01/2017  PCP: [name], MD  Brief History/Interval Summary: 52 year old Caucasian male with a past medical history of [name] on chemotherapy, status post radiation to brain in 2015, thrombocytopenia presented with shortness of breath.  Evaluation revealed acute pulmonary embolism.  Due to his severe thrombocytopenia anticoagulation was not initiated after discussions with patient's oncologist as well as pulmonology.  Reason for Visit: Acute pulmonary embolism  Consultants: Oncology.  Phone discussion with pulmonology.  Procedures: None  Antibiotics: None  Subjective/Interval History: Patient states that he feels slightly better in terms of his chest discomfort.  Shortness of breath is about the same.  Continues to have a dry cough.  Denies any dizziness or lightheadedness.    ROS: Denies any nausea or vomiting  Objective:  Vital Signs  Vitals:   11/03/17 1918 11/03/17 2144 11/04/17 0431 11/04/17 0900  BP:  115/76 136/78   Pulse:  99 87   Resp:  20 (!) 26   Temp:  97.8 F (36.6 C) 98.5 F (36.9 C)   TempSrc:  Oral Oral   SpO2: 94% 96% 99% 94%  Weight:   90.2 kg (198 lb 13.7 oz)   Height:        Intake/Output Summary (Last 24 hours) at 11/04/2017 1001 Last data filed at 11/04/2017 0916 Gross per 24 hour  Intake 2580 ml  Output 2850 ml  Net -270 ml   Filed Weights   11/02/17 0627 11/03/17 0557 11/04/17 0431  Weight: 92.4 kg (203 lb 11.3 oz) 91.2 kg (201 lb 1 oz) 90.2 kg (198 lb 13.7 oz)    General appearance: Awake alert.  In no distress. Resp: Diminished air entry at the bases but no wheezing rales or rhonchi.  Mildly tachypneic at rest.    Cardio: S1-S2 is normal regular.  No S3-S4.  No rubs murmurs of bruit GI: Abdomen remains soft.  Nontender nondistended.  No masses organomegaly. Extremities: No edema Neurologic: No obvious focal  neurological deficits  Lab Results:  Data Reviewed: I have personally reviewed following labs and imaging studies  CBC: Recent Labs  Lab 11/01/17 1043 11/02/17 0500 11/02/17 0524 11/02/17 1804 11/03/17 0400 11/04/17 0403  WBC 20.9* 32.1*  --   --  30.2* 22.3*  NEUTROABS 16.3*  --   --   --   --   --   HGB 7.8* 5.1* 7.3* 9.4* 9.4* 8.9*  HCT 22.7* 15.1* 21.9* 27.9* 27.4* 26.4*  MCV 87.0 88.8  --   --  90.1 91.7  PLT 19* 26*  --   --  26* 27*    Basic Metabolic Panel: Recent Labs  Lab 11/01/17 1043 11/02/17 0500 11/04/17 0403  NA 140 140 144  K 4.0 4.8 3.9  CL 106 110 112*  CO2 23 22 21*  GLUCOSE 98 113* 96  BUN 16 15 14   CREATININE 1.08 1.04 1.09  CALCIUM 7.9* 7.4* 7.4*    GFR: Estimated Creatinine Clearance: 95.3 mL/min (by C-G formula based on SCr of 1.09 mg/dL).  Liver Function Tests: Recent Labs  Lab 11/01/17 1043 11/02/17 0500  AST 45* 42*  ALT 50 44  ALKPHOS 811* 640*  BILITOT 0.6 0.7  PROT 5.8* 5.6*  ALBUMIN 3.3* 2.9*    Coagulation Profile: Recent Labs  Lab 11/02/17 0500  INR 1.03    Cardiac Enzymes: Recent Labs  Lab 11/01/17 1706 11/01/17 2257 11/02/17 0500  TROPONINI 0.76* 0.96* 0.89*  Radiology Studies: No results found.   Medications:  Scheduled: . albuterol  2.5 mg Nebulization TID  . docusate sodium  100 mg Oral Daily  . folic acid  1 mg Oral Daily  . mirtazapine  30 mg Oral QHS  . mometasone-formoterol  2 puff Inhalation BID  . pantoprazole  40 mg Oral Daily  . predniSONE  10 mg Oral Q breakfast   Followed by  . [START ON 11/06/2017] predniSONE  5 mg Oral Q breakfast  . protein supplement shake  11 oz Oral Q24H   Continuous: . sodium chloride 75 mL/hr at 11/03/17 1903   OZH:[id number] **OR** acetaminophen, albuterol, ALPRAZolam, bisacodyl, HYDROcodone-acetaminophen, morphine injection, ondansetron **OR** ondansetron (ZOFRAN) IV, prochlorperazine, senna-docusate, sodium chloride  flush  Assessment/Plan:  Active Problems:   Pulmonary embolism (HCC)    Acute pulmonary embolism with hypoxia Patient with history of malignancy and likely has hypercoagulable state as a result.  Presented with shortness of breath and CT angiogram suggested pulmonary embolism.  However due to severe thrombocytopenia risk of bleeding thought to be quite high.  Oncology has recommended holding off on anticoagulation until his platelet counts become greater than 50,000. Treat chest discomfort with narcotics. Elevated troponin most likely due to pulmonary embolism.  He does not have any evidence for ischemia.  Continue supplemental oxygen.  Wait for platelet counts to improve.  Once platelet count is greater than 50,000 we will initiate Lovenox as recommended by oncology.    Elevated troponin Most likely due to pulmonary embolism.  He is chest pain-free currently.  EKG did not show any acute changes.  Normocytic anemia No evidence of overt bleeding. Hemoglobin has improved posttransfusion.  Leukocytosis most likely to PEGfilgrastim given on 2/9.   Thrombocytopenia Probably due to chemotherapy.  Await improvement in platelet counts.  No evidence for bleeding.  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  Status post brain radiation in 2015.  Plan is for referral to [facility] for second opinion.  Noted to be on steroids which will be continued.  DVT Prophylaxis: SCDs    Code Status: Full Code Family Communication: Discussed with the patient Disposition Plan: Management as outlined above.    LOS: 3 days   [place] [facility] Pager [phone] 11/04/2017, 10:01 AM  If 7PM-7AM, please contact night-coverage at [email or web address], password [facility]

## 2017-11-04 NOTE — Progress Notes (Signed)
   11/04/17 1458  Clinical Encounter Type  Visited With Patient and family together  Visit Type Initial  Spiritual Encounters  Spiritual Needs Prayer   Rounding on 4th floor.  Patient's wife was at bedside.  Patient is hopefull to get better soon.  Good support system with family and friends and church.  Will follow and support as needed. Chaplain Katherene Ponto

## 2017-11-05 LAB — CBC
HCT: 26.9 % — ABNORMAL LOW (ref 39.0–52.0)
HEMOGLOBIN: 9.1 g/dL — AB (ref 13.0–17.0)
MCH: 31.1 pg (ref 26.0–34.0)
MCHC: 33.8 g/dL (ref 30.0–36.0)
MCV: 91.8 fL (ref 78.0–100.0)
PLATELETS: 32 10*3/uL — AB (ref 150–400)
RBC: 2.93 MIL/uL — AB (ref 4.22–5.81)
RDW: 16.5 % — ABNORMAL HIGH (ref 11.5–15.5)
WBC: 18.1 10*3/uL — AB (ref 4.0–10.5)

## 2017-11-05 MED ORDER — ALUM & MAG HYDROXIDE-SIMETH 200-200-20 MG/5ML PO SUSP
30.0000 mL | Freq: Four times a day (QID) | ORAL | Status: DC | PRN
  Administered 2017-11-05: 30 mL via ORAL
  Filled 2017-11-05: qty 30

## 2017-11-05 NOTE — Progress Notes (Signed)
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  Evaluation revealed acute pulmonary embolism.  Due to his severe thrombocytopenia anticoagulation was not initiated after discussions with patient's oncologist as well as pulmonology.  Reason for Visit: Acute pulmonary embolism  Consultants: Oncology.  Phone discussion with pulmonology.  Procedures: None  Antibiotics: None  Subjective/Interval History: Patient with no new complaints.  Continues to have episodes of shortness of breath at times.  Chest pain is better controlled.  No dizziness or lightheadedness.  Has a good appetite.     ROS: Denies any nausea or vomiting  Objective:  Vital Signs  Vitals:   11/04/17 1944 11/04/17 2200 11/05/17 0452 11/05/17 0804  BP:  (!) 145/82 116/86   Pulse:  (!) 107 96   Resp:  (!) 30 18   Temp:  98.1 F (36.7 C) 98.8 F (37.1 C)   TempSrc:  Oral Oral   SpO2: 90% 93% 93% 91%  Weight:   90.7 kg (199 lb 15.3 oz)   Height:        Intake/Output Summary (Last 24 hours) at 11/05/2017 0945 Last data filed at 11/05/2017 2725 Gross per 24 hour  Intake 1777.5 ml  Output 2825 ml  Net -1047.5 ml   Filed Weights   11/03/17 0557 11/04/17 0431 11/05/17 0452  Weight: 91.2 kg (201 lb 1 oz) 90.2 kg (198 lb 13.7 oz) 90.7 kg (199 lb 15.3 oz)    General appearance: Awake alert.  In no distress Resp: Decreased air entry at the bases.  No wheezing rales or rhonchi.  Mildly tachypneic at rest. Cardio: S1-S2 is normal regular.  No S3-S4 GI: Abdomen remains soft.  Nontender nondistended.  No masses organomegaly Extremities: No edema Neurologic: No obvious focal neurological deficits  Lab  Results:  Data Reviewed: I have personally reviewed following labs and imaging studies  CBC: Recent Labs  Lab 11/01/17 1043 11/02/17 0500 11/02/17 0524 11/02/17 1804 11/03/17 0400 11/04/17 0403 11/05/17 0535  WBC 20.9* 32.1*  --   --  30.2* 22.3* 18.1*  NEUTROABS 16.3*  --   --   --   --   --   --   HGB 7.8* 5.1* 7.3* 9.4* 9.4* 8.9* 9.1*  HCT 22.7* 15.1* 21.9* 27.9* 27.4* 26.4* 26.9*  MCV 87.0 88.8  --   --  90.1 91.7 91.8  PLT 19* 26*  --   --  26* 27* 32*    Basic Metabolic Panel: Recent Labs  Lab 11/01/17 1043 11/02/17 0500 11/04/17 0403  NA 140 140 144  K 4.0 4.8 3.9  CL 106 110 112*  CO2 23 22 21*  GLUCOSE 98 113* 96  BUN 16 15 14   CREATININE 1.08 1.04 1.09  CALCIUM 7.9* 7.4* 7.4*    GFR: Estimated Creatinine Clearance: 95.3 mL/min (by C-G formula based on SCr of 1.09 mg/dL).  Liver Function Tests: Recent Labs  Lab 11/01/17 1043 11/02/17 0500  AST 45* 42*  ALT 50 44  ALKPHOS 811* 640*  BILITOT 0.6 0.7  PROT 5.8* 5.6*  ALBUMIN 3.3* 2.9*    Coagulation Profile: Recent Labs  Lab 11/02/17 0500  INR 1.03    Cardiac Enzymes: Recent Labs  Lab 11/01/17 1706 11/01/17 2257 11/02/17 0500  TROPONINI 0.76*  0.96* 0.89*     Radiology Studies: No results found.   Medications:  Scheduled: . albuterol  2.5 mg Nebulization TID  . docusate sodium  100 mg Oral Daily  . folic acid  1 mg Oral Daily  . mirtazapine  30 mg Oral QHS  . mometasone-formoterol  2 puff Inhalation BID  . pantoprazole  40 mg Oral Daily  . predniSONE  80 mg Oral Q breakfast   Followed by  . [START ON 11/09/2017] predniSONE  60 mg Oral Q breakfast   Followed by  . [START ON 11/13/2017] predniSONE  40 mg Oral Q breakfast   Followed by  . [START ON 11/17/2017] predniSONE  20 mg Oral Q breakfast   Followed by  . [START ON 11/21/2017] predniSONE  10 mg Oral Q breakfast   Followed by  . [START ON 11/25/2017] predniSONE  5 mg Oral Q breakfast  . protein supplement shake  11 oz Oral  Q24H   Continuous: . sodium chloride 50 mL/hr at 11/05/17 0118   HQR:FXJOITGPQDIYM **OR** acetaminophen, albuterol, ALPRAZolam, bisacodyl, HYDROcodone-acetaminophen, HYDROcodone-homatropine, morphine injection, ondansetron **OR** ondansetron (ZOFRAN) IV, prochlorperazine, senna-docusate, sodium chloride flush  Assessment/Plan:  Active Problems:   Pulmonary embolism (HCC)    Acute pulmonary embolism with hypoxia Patient with history of malignancy and likely has hypercoagulable state as a result.  Presented with shortness of breath and CT angiogram suggested pulmonary embolism.  However due to severe thrombocytopenia risk of bleeding thought to be quite high.  Oncology has recommended holding off on anticoagulation until his platelet counts become greater than 50,000.  Seems to be hemodynamically stable.  Chest discomfort has improved.  Continue treating with morphine as needed. Elevated troponin most likely due to pulmonary embolism.  He does not have any evidence for ischemia.  Continue supplemental oxygen.  Wait for platelet counts to improve.  Once platelet count is greater than 50,000 we will initiate Lovenox as recommended by oncology.    Elevated troponin Most likely due to pulmonary embolism.  He is chest pain-free currently.  EKG did not show any acute changes.  Normocytic anemia No evidence of overt bleeding. Hemoglobin has improved posttransfusion.  Leukocytosis most likely to PEGfilgrastim given on 2/9.  WBC is improving.  Thrombocytopenia Probably due to chemotherapy.  Await improvement in platelet counts.  No evidence for bleeding.  Counts are slightly better today.  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  Status post brain radiation in 2015.  Plan is for referral to [facility] for second opinion.  Noted to be on steroids which will be continued per oncology.  DVT Prophylaxis: SCDs    Code  Status: Full Code Family Communication: Discussed with the patient Disposition Plan: Management as outlined above.    LOS: 4 days   [place] [facility] Pager [phone] 11/05/2017, 9:45 AM  If 7PM-7AM, please contact night-coverage at [email or web address], password [facility]

## 2017-11-06 LAB — CBC
HEMATOCRIT: 26.5 % — AB (ref 39.0–52.0)
HEMOGLOBIN: 9 g/dL — AB (ref 13.0–17.0)
MCH: 30.9 pg (ref 26.0–34.0)
MCHC: 34 g/dL (ref 30.0–36.0)
MCV: 91.1 fL (ref 78.0–100.0)
Platelets: 40 10*3/uL — ABNORMAL LOW (ref 150–400)
RBC: 2.91 MIL/uL — ABNORMAL LOW (ref 4.22–5.81)
RDW: 16.3 % — ABNORMAL HIGH (ref 11.5–15.5)
WBC: 17.3 10*3/uL — AB (ref 4.0–10.5)

## 2017-11-06 NOTE — Progress Notes (Signed)
Pt complaining of L stabbing chest pain persisting fro 5 minutes. Pt states became flushed during this episode. Axillary temp 97.2. MD paged and awaiting response.

## 2017-11-06 NOTE — Progress Notes (Signed)
[facility] PROGRESS NOTE  Oscar Floyd GEX:[id number] DOB: September 20, 1965 DOA: 11/01/2017  PCP: [name], MD  Brief History/Interval Summary: 52 year old Caucasian male with a past medical history of [name] on chemotherapy, status post radiation to brain in 2015, thrombocytopenia presented with shortness of breath.  Evaluation revealed acute pulmonary embolism.  Due to his severe thrombocytopenia anticoagulation was not initiated after discussions with patient's oncologist as well as pulmonology.  Reason for Visit: Acute pulmonary embolism  Consultants: Oncology.  Phone discussion with pulmonology.  Procedures: None  Antibiotics: None  Subjective/Interval History: Patient states that her shortness of breath has improved.  Denies any chest pain currently.  Reports good appetite.  Mentions that he coughed up some blood.  ROS: Denies any nausea or vomiting.  Objective:  Vital Signs  Vitals:   11/05/17 2133 11/05/17 2208 11/06/17 0504 11/06/17 0853  BP:  111/74 120/79   Pulse:  99 88   Resp:  18 20   Temp:  98.5 F (36.9 C) 98.1 F (36.7 C)   TempSrc:  Oral Oral   SpO2: 96% 95% 94% 93%  Weight:   90.3 kg (199 lb 1.2 oz)   Height:        Intake/Output Summary (Last 24 hours) at 11/06/2017 1344 Last data filed at 11/06/2017 1000 Gross per 24 hour  Intake 2347.83 ml  Output 2200 ml  Net 147.83 ml   Filed Weights   11/04/17 0431 11/05/17 0452 11/06/17 0504  Weight: 90.2 kg (198 lb 13.7 oz) 90.7 kg (199 lb 15.3 oz) 90.3 kg (199 lb 1.2 oz)    General appearance: Awake alert.  In no distress Resp: Diminished air entry at the bases.  No wheezing or rhonchi.  Few crackles at the bases. Cardio: S1-S2 remains normal and regular.  No S3-S4.  No rubs murmurs or bruit GI: Abdomen is soft.  Nontender.  No masses organomegaly.  Bowel sounds present Extremities: No edema Neurologic: No obvious focal neurological deficits  Lab  Results:  Data Reviewed: I have personally reviewed following labs and imaging studies  CBC: Recent Labs  Lab 11/01/17 1043 11/02/17 0500  11/02/17 1804 11/03/17 0400 11/04/17 0403 11/05/17 0535 11/06/17 0514  WBC 20.9* 32.1*  --   --  30.2* 22.3* 18.1* 17.3*  NEUTROABS 16.3*  --   --   --   --   --   --   --   HGB 7.8* 5.1*   < > 9.4* 9.4* 8.9* 9.1* 9.0*  HCT 22.7* 15.1*   < > 27.9* 27.4* 26.4* 26.9* 26.5*  MCV 87.0 88.8  --   --  90.1 91.7 91.8 91.1  PLT 19* 26*  --   --  26* 27* 32* 40*   < > = values in this interval not displayed.    Basic Metabolic Panel: Recent Labs  Lab 11/01/17 1043 11/02/17 0500 11/04/17 0403  NA 140 140 144  K 4.0 4.8 3.9  CL 106 110 112*  CO2 23 22 21*  GLUCOSE 98 113* 96  BUN 16 15 14   CREATININE 1.08 1.04 1.09  CALCIUM 7.9* 7.4* 7.4*    GFR: Estimated Creatinine Clearance: 95.3 mL/min (by C-G formula based on SCr of 1.09 mg/dL).  Liver Function Tests: Recent Labs  Lab 11/01/17 1043 11/02/17 0500  AST 45* 42*  ALT 50 44  ALKPHOS 811* 640*  BILITOT 0.6 0.7  PROT 5.8* 5.6*  ALBUMIN 3.3* 2.9*    Coagulation Profile: Recent Labs  Lab 11/02/17 0500  INR 1.03    Cardiac Enzymes: Recent Labs  Lab 11/01/17 1706 11/01/17 2257 11/02/17 0500  TROPONINI 0.76* 0.96* 0.89*     Radiology Studies: No results found.   Medications:  Scheduled: . albuterol  2.5 mg Nebulization TID  . docusate sodium  100 mg Oral Daily  . folic acid  1 mg Oral Daily  . mirtazapine  30 mg Oral QHS  . mometasone-formoterol  2 puff Inhalation BID  . pantoprazole  40 mg Oral Daily  . predniSONE  80 mg Oral Q breakfast   Followed by  . [START ON 11/09/2017] predniSONE  60 mg Oral Q breakfast   Followed by  . [START ON 11/13/2017] predniSONE  40 mg Oral Q breakfast   Followed by  . [START ON 11/17/2017] predniSONE  20 mg Oral Q breakfast   Followed by  . [START ON 11/21/2017] predniSONE  10 mg Oral Q breakfast   Followed by  . [START ON  11/25/2017] predniSONE  5 mg Oral Q breakfast  . protein supplement shake  11 oz Oral Q24H   Continuous: . sodium chloride 50 mL/hr at 11/06/17 0700   NAT:FTDDUKGURKYHC **OR** acetaminophen, albuterol, ALPRAZolam, alum & mag hydroxide-simeth, bisacodyl, HYDROcodone-acetaminophen, HYDROcodone-homatropine, morphine injection, ondansetron **OR** ondansetron (ZOFRAN) IV, prochlorperazine, senna-docusate, sodium chloride flush  Assessment/Plan:  Active Problems:   Pulmonary embolism (HCC)    Acute pulmonary embolism with hypoxia Patient with history of malignancy and likely has hypercoagulable state as a result.  Presented with shortness of breath and CT angiogram suggested pulmonary embolism.  However due to severe thrombocytopenia risk of bleeding thought to be quite high.  Oncology has recommended holding off on anticoagulation until his platelet counts become greater than 50,000.  Patient remains hemodynamically stable.  Does have some hemoptysis.  We will continue to monitor him closely.  Platelet count slowly improving.  Chest pain has improved.  Continue as needed morphine.  Elevated troponin most likely due to pulmonary embolism. Continue supplemental oxygen.  Wait for platelet counts to improve.  Once platelet count is greater than 50,000 we will initiate Lovenox as recommended by oncology.    Elevated troponin Most likely due to pulmonary embolism.  He is chest pain-free currently.  EKG did not show any acute changes.  Normocytic anemia No evidence of overt bleeding. Hemoglobin has improved posttransfusion.  Leukocytosis most likely to PEGfilgrastim given on 2/9.  WBC continues to improve.  Hemoglobin is stable.  Thrombocytopenia Probably due to chemotherapy.  Await improvement in platelet counts.  No evidence for bleeding.  Counts continue to improve.  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  Status post  brain radiation in 2015.  Plan is for referral to [facility] for second opinion.  Noted to be on steroids which will be continued per oncology.  DVT Prophylaxis: SCDs    Code Status: Full Code Family Communication: Discussed with the patient Disposition Plan: Management as outlined above.    LOS: 5 days   [place] [facility] Pager [phone] 11/06/2017, 1:44 PM  If 7PM-7AM, please contact night-coverage at [email or web address], password [facility]

## 2017-11-07 ENCOUNTER — Encounter: Payer: Self-pay | Admitting: *Deleted

## 2017-11-07 DIAGNOSIS — J189 Pneumonia, unspecified organism: Secondary | ICD-10-CM

## 2017-11-07 LAB — CBC
HCT: 26.3 % — ABNORMAL LOW (ref 39.0–52.0)
Hemoglobin: 8.9 g/dL — ABNORMAL LOW (ref 13.0–17.0)
MCH: 30.9 pg (ref 26.0–34.0)
MCHC: 33.8 g/dL (ref 30.0–36.0)
MCV: 91.3 fL (ref 78.0–100.0)
PLATELETS: 48 10*3/uL — AB (ref 150–400)
RBC: 2.88 MIL/uL — AB (ref 4.22–5.81)
RDW: 16.3 % — AB (ref 11.5–15.5)
WBC: 16.6 10*3/uL — ABNORMAL HIGH (ref 4.0–10.5)

## 2017-11-07 LAB — BASIC METABOLIC PANEL
ANION GAP: 9 (ref 5–15)
BUN: 18 mg/dL (ref 6–20)
CO2: 22 mmol/L (ref 22–32)
Calcium: 7.6 mg/dL — ABNORMAL LOW (ref 8.9–10.3)
Chloride: 111 mmol/L (ref 101–111)
Creatinine, Ser: 0.91 mg/dL (ref 0.61–1.24)
GFR calc Af Amer: >60 mL/min (ref >60)
GLUCOSE: 88 mg/dL (ref 65–99)
POTASSIUM: 4.1 mmol/L (ref 3.5–5.1)
Sodium: 142 mmol/L (ref 135–145)

## 2017-11-07 MED ORDER — ENOXAPARIN SODIUM 100 MG/ML ~~LOC~~ SOLN
1.0000 mg/kg | Freq: Two times a day (BID) | SUBCUTANEOUS | Status: DC
  Administered 2017-11-07 – 2017-11-13 (×13): 90 mg via SUBCUTANEOUS
  Filled 2017-11-07 (×13): qty 1

## 2017-11-07 NOTE — Progress Notes (Signed)
Oncology Nurse Navigator Documentation  Oncology Nurse Navigator Flowsheets 11/07/2017  Navigator Location [facility]  Navigator Encounter Type Other/per Dr. Julien Nordmann, I called [facility] to check on mutation results.  I was told, they will email Dr. Julien Nordmann with an update today.  I updated Dr. Julien Nordmann.   Patient Visit Type Inpatient  Treatment Phase Treatment  Barriers/Navigation Needs Coordination of Care  Interventions Coordination of Care  Coordination of Care Other  Acuity Level 2  Time Spent with Patient 30

## 2017-11-07 NOTE — Progress Notes (Signed)
PT Cancellation Note  Patient Details Name: Oscar Floyd MRN: [id number] DOB: August 06, 1966   Cancelled Treatment:    Reason Eval/Treat Not Completed: Other (comment)(per RN, pt is independent with mobility and does not need PT/OT. Will sign off. )   Philomena Doheny 11/07/2017, 2:12 PM [phone]

## 2017-11-07 NOTE — Progress Notes (Signed)
Subjective: The patient is seen and examined today.  He is feeling a little bit better today.  He continues to have shortness of breath with exertion.  He was a started on high-dose prednisone and has been tolerating it better.  He denied having any bleeding issues.  He has no nausea, vomiting, diarrhea or constipation.  He denied having any fever or chills.  Objective: Vital signs in last 24 hours: Temp:  [97.2 F (36.2 C)-98 F (36.7 C)] 98 F (36.7 C) (02/25 0544) Pulse Rate:  [80-98] 80 (02/25 0544) Resp:  [16-18] 16 (02/25 0544) BP: (110-121)/(77-82) 114/78 (02/25 0544) SpO2:  [94 %-98 %] 96 % (02/25 0714) Weight:  [196 lb 1.6 oz (89 kg)] 196 lb 1.6 oz (89 kg) (02/25 0544)  Intake/Output from previous day: 02/24 0701 - 02/25 0700 In: 1934.2 [P.O.:480; I.V.:1454.2] Out: 3545 [Urine:3545] Intake/Output this shift: No intake/output data recorded.  General appearance: alert, cooperative, fatigued and no distress Resp: rales bilaterally Cardio: regular rate and rhythm, S1, S2 normal, no murmur, click, rub or gallop GI: soft, non-tender; bowel sounds normal; no masses,  no organomegaly Extremities: extremities normal, atraumatic, no cyanosis or edema  Lab Results:  Recent Labs    11/06/17 0514 11/07/17 0637  WBC 17.3* 16.6*  HGB 9.0* 8.9*  HCT 26.5* 26.3*  PLT 40* 48*   BMET Recent Labs    11/07/17 0637  NA 142  K 4.1  CL 111  CO2 22  GLUCOSE 88  BUN 18  CREATININE 0.91  CALCIUM 7.6*    Studies/Results: No results found.  Medications: I have reviewed the patient's current medications.  Assessment/Plan: This is a very pleasant 52 years old white male with: 1)  metastatic non-small cell lung cancer, adenocarcinoma with positive EGFR mutation status post several treatment with targeted therapy but unfortunately he recently developed resistant to treatment with Tagrisso.  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  This is not responsive to his previous treatment.  The  patient was a started on systemic chemotherapy with carboplatin, Alimta and Keytruda but has a rough time tolerating this treatment with significant pancytopenia.  I may consider treating the patient with first generation targeted therapy in combination with the third-generation Tagrisso after discharge from the hospital. 2) acute pulmonary embolism: His platelets count had improved to 48,000 today.  I will start the patient on Lovenox 1 mg/KG twice daily.  We will change this to 1.5 mg/KG daily on discharge. 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 4) disposition: The patient may be ready for discharge within the next 1-2 days as we monitor closely his platelet count and treatment for the pulmonary embolism. Thank you so much for taking good care of Mr. Cerezo, I will continue to follow-up the patient with you and assist in his basis.  LOS: 6 days    Eilleen Kempf 11/07/2017

## 2017-11-07 NOTE — Progress Notes (Signed)
Oncology Nurse Navigator Documentation  Oncology Nurse Navigator Flowsheets 11/07/2017  Navigator Location [facility]  Navigator Encounter Type Other/I spoke with patient at [facility] today.  He looks much better today and his platelets have improved.  I helped educate on treatment plan with anti-coagulant.    Patient Visit Type Inpatient  Treatment Phase Treatment  Barriers/Navigation Needs Education  Education Other  Interventions Education  Education Method Verbal  Acuity Level 2  Time Spent with Patient 30

## 2017-11-07 NOTE — Progress Notes (Signed)
[facility] PROGRESS NOTE  Oscar Floyd VZC:[id number] DOB: 12/18/1965 DOA: 11/01/2017  PCP: [name], MD  Brief History/Interval Summary: 52 year old Caucasian male with a past medical history of [name] on chemotherapy, status post radiation to brain in 2015, thrombocytopenia presented with shortness of breath.  Evaluation revealed acute pulmonary embolism.  Due to his severe thrombocytopenia anticoagulation was not initiated after discussions with patient's oncologist as well as pulmonology.  Reason for Visit: Acute pulmonary embolism  Consultants: Oncology.  Phone discussion with pulmonology.  Procedures: None  Antibiotics: None  Subjective/Interval History: Patient feels about the same.  No worsening shortness of breath.  No chest pain currently.  He did have some left-sided chest pain yesterday evening.    ROS: Denies any nausea or vomiting.  Objective:  Vital Signs  Vitals:   11/06/17 1908 11/06/17 2241 11/07/17 0544 11/07/17 0714  BP:  110/77 114/78   Pulse:  94 80   Resp:  18 16   Temp:  (!) 97.5 F (36.4 C) 98 F (36.7 C)   TempSrc:  Oral Oral   SpO2: 94% 98% 95% 96%  Weight:   89 kg (196 lb 1.6 oz)   Height:        Intake/Output Summary (Last 24 hours) at 11/07/2017 0948 Last data filed at 11/07/2017 0935 Gross per 24 hour  Intake 1934.17 ml  Output 2845 ml  Net -910.83 ml   Filed Weights   11/05/17 0452 11/06/17 0504 11/07/17 0544  Weight: 90.7 kg (199 lb 15.3 oz) 90.3 kg (199 lb 1.2 oz) 89 kg (196 lb 1.6 oz)    General appearance: Awake alert.  In no distress. Resp: Continues to have reasonably good air entry bilaterally although diminished at the bases.  No wheezing or rhonchi.   Cardio: S1-S2 is normal regular.  No S3 or S4.  No rubs murmurs or bruit GI: Abdomen remains soft.  Nontender nondistended.  Bowel sounds are present.  No masses organomegaly Extremities: No edema Neurologic: No obvious focal  neurological deficits  Lab Results:  Data Reviewed: I have personally reviewed following labs and imaging studies  CBC: Recent Labs  Lab 11/01/17 1043  11/03/17 0400 11/04/17 0403 11/05/17 0535 11/06/17 0514 11/07/17 0637  WBC 20.9*   < > 30.2* 22.3* 18.1* 17.3* 16.6*  NEUTROABS 16.3*  --   --   --   --   --   --   HGB 7.8*   < > 9.4* 8.9* 9.1* 9.0* 8.9*  HCT 22.7*   < > 27.4* 26.4* 26.9* 26.5* 26.3*  MCV 87.0   < > 90.1 91.7 91.8 91.1 91.3  PLT 19*   < > 26* 27* 32* 40* 48*   < > = values in this interval not displayed.    Basic Metabolic Panel: Recent Labs  Lab 11/01/17 1043 11/02/17 0500 11/04/17 0403 11/07/17 0637  NA 140 140 144 142  K 4.0 4.8 3.9 4.1  CL 106 110 112* 111  CO2 23 22 21* 22  GLUCOSE 98 113* 96 88  BUN 16 15 14 18   CREATININE 1.08 1.04 1.09 0.91  CALCIUM 7.9* 7.4* 7.4* 7.6*    GFR: Estimated Creatinine Clearance: 114.2 mL/min (by C-G formula based on SCr of 0.91 mg/dL).  Liver Function Tests: Recent Labs  Lab 11/01/17 1043 11/02/17 0500  AST 45* 42*  ALT 50 44  ALKPHOS 811* 640*  BILITOT 0.6 0.7  PROT 5.8* 5.6*  ALBUMIN 3.3* 2.9*  Coagulation Profile: Recent Labs  Lab 11/02/17 0500  INR 1.03    Cardiac Enzymes: Recent Labs  Lab 11/01/17 1706 11/01/17 2257 11/02/17 0500  TROPONINI 0.76* 0.96* 0.89*     Radiology Studies: No results found.   Medications:  Scheduled: . albuterol  2.5 mg Nebulization TID  . docusate sodium  100 mg Oral Daily  . enoxaparin (LOVENOX) injection  1 mg/kg Subcutaneous Q12H  . folic acid  1 mg Oral Daily  . mirtazapine  30 mg Oral QHS  . mometasone-formoterol  2 puff Inhalation BID  . pantoprazole  40 mg Oral Daily  . predniSONE  80 mg Oral Q breakfast   Followed by  . [START ON 11/09/2017] predniSONE  60 mg Oral Q breakfast   Followed by  . [START ON 11/13/2017] predniSONE  40 mg Oral Q breakfast   Followed by  . [START ON 11/17/2017] predniSONE  20 mg Oral Q breakfast   Followed by   . [START ON 11/21/2017] predniSONE  10 mg Oral Q breakfast   Followed by  . [START ON 11/25/2017] predniSONE  5 mg Oral Q breakfast  . protein supplement shake  11 oz Oral Q24H   Continuous: . sodium chloride 50 mL/hr at 11/06/17 1720   CNO:[id number] **OR** acetaminophen, albuterol, ALPRAZolam, alum & mag hydroxide-simeth, bisacodyl, HYDROcodone-acetaminophen, HYDROcodone-homatropine, morphine injection, ondansetron **OR** ondansetron (ZOFRAN) IV, prochlorperazine, senna-docusate, sodium chloride flush  Assessment/Plan:  Active Problems:   Pulmonary embolism (HCC)    Acute pulmonary embolism with hypoxia Patient with history of malignancy and likely has hypercoagulable state as a result.  Presented with shortness of breath and CT angiogram suggested pulmonary embolism.  However due to severe thrombocytopenia risk of bleeding thought to be quite high.  Oncology recommended holding off on anticoagulation until his platelet counts become greater than 50,000.  Patient remains hemodynamically stable.  Occasional symptoms of chest discomfort and shortness of breath.  Platelet count is up to 48,000.  Should be able to start Lovenox soon.  Continue as needed morphine for pain control.    Elevated troponin Most likely due to pulmonary embolism.  He is chest pain-free currently.  EKG did not show any acute changes.  Normocytic anemia No evidence of overt bleeding. Hemoglobin has improved posttransfusion.  Leukocytosis most likely to PEGfilgrastim given on 2/9.  WBC continues to improve.  Hemoglobin is stable.  Thrombocytopenia Most likely due to chemotherapy.  Platelet count is improving and up to 48,000 today.    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  Status post brain radiation in 2015.  Plan is for referral to [facility] for second opinion.  Noted to be on steroids which will be continued per  oncology.  DVT Prophylaxis: SCDs    Code Status: Full Code Family Communication: Discussed with the patient Disposition Plan: Management as outlined above.  Start mobilizing.    LOS: 6 days   [place] [facility] Pager [phone] 11/07/2017, 9:48 AM  If 7PM-7AM, please contact night-coverage at [email or web address], password [facility]

## 2017-11-08 ENCOUNTER — Inpatient Hospital Stay (HOSPITAL_COMMUNITY): Payer: BLUE CROSS/BLUE SHIELD

## 2017-11-08 DIAGNOSIS — R0609 Other forms of dyspnea: Secondary | ICD-10-CM

## 2017-11-08 LAB — BASIC METABOLIC PANEL
ANION GAP: 10 (ref 5–15)
BUN: 23 mg/dL — AB (ref 6–20)
CO2: 21 mmol/L — AB (ref 22–32)
Calcium: 7.7 mg/dL — ABNORMAL LOW (ref 8.9–10.3)
Chloride: 111 mmol/L (ref 101–111)
Creatinine, Ser: 1.09 mg/dL (ref 0.61–1.24)
GFR calc Af Amer: >60 mL/min (ref >60)
GLUCOSE: 88 mg/dL (ref 65–99)
POTASSIUM: 4 mmol/L (ref 3.5–5.1)
Sodium: 142 mmol/L (ref 135–145)

## 2017-11-08 LAB — CBC
HEMATOCRIT: 25.9 % — AB (ref 39.0–52.0)
Hemoglobin: 8.9 g/dL — ABNORMAL LOW (ref 13.0–17.0)
MCH: 31.4 pg (ref 26.0–34.0)
MCHC: 34.4 g/dL (ref 30.0–36.0)
MCV: 91.5 fL (ref 78.0–100.0)
PLATELETS: 59 10*3/uL — AB (ref 150–400)
RBC: 2.83 MIL/uL — ABNORMAL LOW (ref 4.22–5.81)
RDW: 16.5 % — AB (ref 11.5–15.5)
WBC: 16 10*3/uL — ABNORMAL HIGH (ref 4.0–10.5)

## 2017-11-08 MED ORDER — FUROSEMIDE 10 MG/ML IJ SOLN
20.0000 mg | Freq: Once | INTRAMUSCULAR | Status: AC
  Administered 2017-11-08: 20 mg via INTRAVENOUS
  Filled 2017-11-08: qty 2

## 2017-11-08 MED ORDER — AMOXICILLIN-POT CLAVULANATE 875-125 MG PO TABS
1.0000 | ORAL_TABLET | Freq: Two times a day (BID) | ORAL | Status: DC
  Administered 2017-11-08 – 2017-11-09 (×2): 1 via ORAL
  Filled 2017-11-08 (×2): qty 1

## 2017-11-08 NOTE — Progress Notes (Signed)
Pt requesting to ambulate. Pt only able to ambulate a very sort distance in the room with a walker and portable oxygen @ 4l [place]. Pt became very short of breath and weaker. Heart Rate low 100s. Pt placed back to bed and on 3l [place] with rest O2 sats 94-95.

## 2017-11-08 NOTE — Progress Notes (Signed)
Nutrition Follow-up  DOCUMENTATION CODES:   Not applicable  INTERVENTION:  - Continue Premier Protein once/day. - Continue to encourage PO intakes.   NUTRITION DIAGNOSIS:   Increased nutrient needs related to catabolic illness, cancer and cancer related treatments as evidenced by estimated needs. -ongoing  GOAL:   Patient will meet greater than or equal to 90% of their needs -met on average.   MONITOR:   PO intake, Supplement acceptance, Weight trends, Labs  ASSESSMENT:   52 year old Caucasian male with a past medical history of [name] on chemotherapy, status post radiation to brain in 2015, thrombocytopenia presented with shortness of breath.  Weight -7 lbs/3.2 kg since admission. Pt eating 75-100% mainly since previous assessment on 2/21. Pt has accepted 3/5 bottles of Premier Protein since order placed on 2/21.   Per Dr. Lyman Speller note yesterday AM: acute pulmonary embolism with hypoxia dx improving, thrombocytopenia thought to be d/t chemo and is improving, stage 4 NSCLC poorly differentiated adenocarcinoma receiving outpatient chemo and plan for referral to [facility] for second opinion.  Medications reviewed; 100 mg Colace/day, 1 mg oral folic acid/day, prednisone taper. Labs reviewed; BUN: 23 mg/dL, Ca: 7.7 mg/dL.  IVF: NS @ 50 mL/hr.     NUTRITION - FOCUSED PHYSICAL EXAM:  Completed/assessed with no muscle and no fat wasting noted.   Diet Order:  Diet regular Room service appropriate? Yes; Fluid consistency: Thin  EDUCATION NEEDS:   No education needs have been identified at this time  Skin:  Skin Assessment: Reviewed RN Assessment  Last BM:  2/26  Height:   Ht Readings from Last 1 Encounters:  11/01/17 6' 2"  (1.88 m)    Weight:   Wt Readings from Last 1 Encounters:  11/08/17 194 lb 12.8 oz (88.4 kg)    Ideal Body Weight:  86.36 kg  BMI:  Body mass index is 25.01 kg/m.  Estimated Nutritional Needs:   Kcal:   7062-3762 (27-29 kcal/kg)  Protein:  130-140 grams  Fluid:  >/= 2.2 L/day      Jarome Matin, MS, RD, LDN, [facility] Inpatient Clinical Dietitian Pager # [phone] After hours/weekend pager # [phone]

## 2017-11-08 NOTE — Progress Notes (Addendum)
[facility] PROGRESS NOTE  Oscar Floyd QPY:[id number] DOB: 10-05-1965 DOA: 11/01/2017  PCP: [name], MD  Brief History/Interval Summary: 52 year old Caucasian male with a past medical history of [name] on chemotherapy, status post radiation to brain in 2015, thrombocytopenia presented with shortness of breath.  Evaluation revealed acute pulmonary embolism.  Due to his severe thrombocytopenia anticoagulation was not initiated after discussions with patient's oncologist as well as pulmonology.  Platelet counts improved.  Patient was started on Lovenox on 2/25.  Reason for Visit: Acute pulmonary embolism  Consultants: Oncology.  Phone discussion with pulmonology.  Procedures: None  Antibiotics: None  Subjective/Interval History: Patient feels quite short of breath after he went to the bedside commode.  Getting short of breath even with minimal activity.  Continues to have a cough with yellowish sputum with small amounts of blood.  No chest pain.    ROS: Denies any nausea vomiting.  Objective:  Vital Signs  Vitals:   11/07/17 2030 11/08/17 0426 11/08/17 0716 11/08/17 1222  BP: 112/64 126/82  127/77  Pulse: 95 90  94  Resp: 18 18  18   Temp: 97.7 F (36.5 C) 98.6 F (37 C)  98.7 F (37.1 C)  TempSrc: Oral Oral  Oral  SpO2: 96% 95% 92% 97%  Weight:  88.4 kg (194 lb 12.8 oz)    Height:        Intake/Output Summary (Last 24 hours) at 11/08/2017 1252 Last data filed at 11/08/2017 1223 Gross per 24 hour  Intake 1255 ml  Output 1625 ml  Net -370 ml   Filed Weights   11/06/17 0504 11/07/17 0544 11/08/17 0426  Weight: 90.3 kg (199 lb 1.2 oz) 89 kg (196 lb 1.6 oz) 88.4 kg (194 lb 12.8 oz)    General appearance: Awake alert.  In no distress. Resp: Has reasonably good air entry bilaterally.  Coarse breath sounds.  Few crackles at the left base.  No wheezing or rhonchi heard.    Cardio: S1-S2 is normal regular.  No S3-S4.  No rubs  murmurs of bruit GI: A abdomen is soft.  Nontender nondistended   Lab Results:  Data Reviewed: I have personally reviewed following labs and imaging studies  CBC: Recent Labs  Lab 11/04/17 0403 11/05/17 0535 11/06/17 0514 11/07/17 0637 11/08/17 0345  WBC 22.3* 18.1* 17.3* 16.6* 16.0*  HGB 8.9* 9.1* 9.0* 8.9* 8.9*  HCT 26.4* 26.9* 26.5* 26.3* 25.9*  MCV 91.7 91.8 91.1 91.3 91.5  PLT 27* 32* 40* 48* 59*    Basic Metabolic Panel: Recent Labs  Lab 11/02/17 0500 11/04/17 0403 11/07/17 0637 11/08/17 0345  NA 140 144 142 142  K 4.8 3.9 4.1 4.0  CL 110 112* 111 111  CO2 22 21* 22 21*  GLUCOSE 113* 96 88 88  BUN 15 14 18  23*  CREATININE 1.04 1.09 0.91 1.09  CALCIUM 7.4* 7.4* 7.6* 7.7*    GFR: Estimated Creatinine Clearance: 95.3 mL/min (by C-G formula based on SCr of 1.09 mg/dL).  Liver Function Tests: Recent Labs  Lab 11/02/17 0500  AST 42*  ALT 44  ALKPHOS 640*  BILITOT 0.7  PROT 5.6*  ALBUMIN 2.9*    Coagulation Profile: Recent Labs  Lab 11/02/17 0500  INR 1.03    Cardiac Enzymes: Recent Labs  Lab 11/01/17 1706 11/01/17 2257 11/02/17 0500  TROPONINI 0.76* 0.96* 0.89*     Radiology Studies: No results found.   Medications:  Scheduled: . albuterol  2.5 mg Nebulization TID  .  docusate sodium  100 mg Oral Daily  . enoxaparin (LOVENOX) injection  1 mg/kg Subcutaneous BID  . folic acid  1 mg Oral Daily  . mirtazapine  30 mg Oral QHS  . mometasone-formoterol  2 puff Inhalation BID  . pantoprazole  40 mg Oral Daily  . [START ON 11/09/2017] predniSONE  60 mg Oral Q breakfast   Followed by  . [START ON 11/13/2017] predniSONE  40 mg Oral Q breakfast   Followed by  . [START ON 11/17/2017] predniSONE  20 mg Oral Q breakfast   Followed by  . [START ON 11/21/2017] predniSONE  10 mg Oral Q breakfast   Followed by  . [START ON 11/25/2017] predniSONE  5 mg Oral Q breakfast  . protein supplement shake  11 oz Oral Q24H   Continuous: . sodium chloride 50  mL/hr at 11/07/17 1030   RCV:[id number] **OR** acetaminophen, albuterol, ALPRAZolam, alum & mag hydroxide-simeth, bisacodyl, HYDROcodone-acetaminophen, HYDROcodone-homatropine, morphine injection, ondansetron **OR** ondansetron (ZOFRAN) IV, prochlorperazine, senna-docusate, sodium chloride flush  Assessment/Plan:  Active Problems:   Pulmonary embolism (HCC)    Acute pulmonary embolism with hypoxia Patient with history of malignancy and likely has hypercoagulable state as a result.  Presented with shortness of breath and CT angiogram suggested pulmonary embolism.  However due to severe thrombocytopenia risk of bleeding thought to be quite high.  Oncology recommended holding off on anticoagulation until his platelet counts become greater than 50,000.  Platelet counts had improved and the patient was started on Lovenox in 2/25.  Platelet counts 59,000 today.  Still remains quite symptomatic.  We will repeat chest x-ray to make sure there is no new process.  Continue supplemental oxygen.  PT and OT evaluation.  As needed morphine for pain control.     Elevated troponin Most likely due to pulmonary embolism.  Patient remains chest pain-free. EKG did not show any acute changes.  Normocytic anemia No evidence of overt bleeding. Hemoglobin has improved posttransfusion.  Leukocytosis most likely to PEGfilgrastim given on 2/9.  WBC continues to improve.  Hemoglobin is stable.  Thrombocytopenia Most likely due to chemotherapy.  Platelet count is improving and up to 59,000 today.    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  Status post brain radiation in 2015.  Plan is for referral to [facility] for [facility] opinion.  Noted to be on steroids which will be continued per oncology.  ADDENDUM CXR showed patchy opacities seen previously as well. He is afebrile. Likely this is related to his cancer. Could be pulmonary edema.  However he does have yellowish expectoration. Can give trial of antibiotics. Start augmentin. Lasix x 1. Stop IVF.  DVT Prophylaxis: Now on full dose Lovenox Code Status: Full Code Family Communication: Discussed with the patient Disposition Plan: Management as outlined above.  Start mobilizing.    LOS: 7 days   [facility] Pager [phone] 11/08/2017, 12:52 PM  If 7PM-7AM, please contact night-coverage at [email or web address], password [facility]

## 2017-11-08 NOTE — Progress Notes (Signed)
   11/08/17 1100  Clinical Encounter Type  Visited With Patient  Visit Type Follow-up  Stress Factors  Patient Stress Factors Health changes   Following up from visit on Friday.  Patient was alone.  Indicated they have started treating the blood clots.  Patient is hopeful that the issue with the breathing is related to the blood clots. Wants to get his strength back and is hopeful about getting better soon.  Will follow and support as needed. Chaplain Katherene Ponto

## 2017-11-09 ENCOUNTER — Other Ambulatory Visit: Payer: BLUE CROSS/BLUE SHIELD

## 2017-11-09 ENCOUNTER — Inpatient Hospital Stay (HOSPITAL_COMMUNITY): Payer: BLUE CROSS/BLUE SHIELD

## 2017-11-09 DIAGNOSIS — I2690 Septic pulmonary embolism without acute cor pulmonale: Secondary | ICD-10-CM

## 2017-11-09 DIAGNOSIS — I2699 Other pulmonary embolism without acute cor pulmonale: Secondary | ICD-10-CM

## 2017-11-09 LAB — CBC
HEMATOCRIT: 26.9 % — AB (ref 39.0–52.0)
HEMOGLOBIN: 9.2 g/dL — AB (ref 13.0–17.0)
MCH: 31.2 pg (ref 26.0–34.0)
MCHC: 34.2 g/dL (ref 30.0–36.0)
MCV: 91.2 fL (ref 78.0–100.0)
Platelets: 65 10*3/uL — ABNORMAL LOW (ref 150–400)
RBC: 2.95 MIL/uL — AB (ref 4.22–5.81)
RDW: 16.5 % — ABNORMAL HIGH (ref 11.5–15.5)
WBC: 15.8 10*3/uL — ABNORMAL HIGH (ref 4.0–10.5)

## 2017-11-09 LAB — BASIC METABOLIC PANEL
ANION GAP: 10 (ref 5–15)
BUN: 21 mg/dL — ABNORMAL HIGH (ref 6–20)
CHLORIDE: 108 mmol/L (ref 101–111)
CO2: 23 mmol/L (ref 22–32)
Calcium: 7.3 mg/dL — ABNORMAL LOW (ref 8.9–10.3)
Creatinine, Ser: 1.03 mg/dL (ref 0.61–1.24)
GFR calc non Af Amer: >60 mL/min (ref >60)
Glucose, Bld: 82 mg/dL (ref 65–99)
POTASSIUM: 3.5 mmol/L (ref 3.5–5.1)
SODIUM: 141 mmol/L (ref 135–145)

## 2017-11-09 MED ORDER — IPRATROPIUM BROMIDE 0.02 % IN SOLN: 0.5000 mg | Freq: Three times a day (TID) | RESPIRATORY_TRACT | Status: DC

## 2017-11-09 MED ORDER — SODIUM CHLORIDE 0.9 % IV SOLN
500.0000 mg | INTRAVENOUS | Status: DC
  Administered 2017-11-09: 500 mg via INTRAVENOUS
  Filled 2017-11-09 (×2): qty 500

## 2017-11-09 MED ORDER — SODIUM CHLORIDE 0.9 % IV SOLN
1.0000 g | INTRAVENOUS | Status: DC
  Administered 2017-11-09: 1 g via INTRAVENOUS
  Filled 2017-11-09 (×2): qty 10

## 2017-11-09 MED ORDER — GUAIFENESIN ER 600 MG PO TB12
1200.0000 mg | ORAL_TABLET | Freq: Two times a day (BID) | ORAL | Status: DC
  Administered 2017-11-09 – 2017-11-13 (×9): 1200 mg via ORAL
  Filled 2017-11-09 (×9): qty 2

## 2017-11-09 MED ORDER — IPRATROPIUM-ALBUTEROL 0.5-2.5 (3) MG/3ML IN SOLN
3.0000 mL | Freq: Three times a day (TID) | RESPIRATORY_TRACT | Status: DC
  Administered 2017-11-09 – 2017-11-13 (×12): 3 mL via RESPIRATORY_TRACT
  Filled 2017-11-09 (×12): qty 3

## 2017-11-09 NOTE — Progress Notes (Signed)
Bilateral lower extremity venous duplex has been completed. Negative for DVT.  11/09/17 2:39 PM Carlos Levering RVT

## 2017-11-09 NOTE — Progress Notes (Signed)
Physical Therapy Treatment Patient Details Name: Oscar Floyd MRN: [id number] DOB: 1965/09/26 Today's Date: 11/09/2017    History of Present Illness 52 y.o. male with medical history significant of [name] on chemotherapy status post brain radiation in 2015, thrombocytopenia comes to the hospital with complains of shortness of breath.  Dx of pulmonary embolism.    PT Comments    Returned at pt request to walk again.  Cues for step height as heel strike as pt shuffling and educated in scapular squeezes and pulling volume on incentive.  Will continue to benefit from skilled PT in the acute setting, but safe to walk with nursing in hallway and encouraged him to ask for assist.    Follow Up Recommendations  Home health PT     Equipment Recommendations  Rolling walker with 5" wheels;3in1 (PT)    Recommendations for Other Services       Precautions / Restrictions Precautions Precautions: Fall Precaution Comments: oxygen dependent    Mobility  Bed Mobility               General bed mobility comments: up in chair  Transfers Overall transfer level: Needs assistance Equipment used: Rolling walker (2 wheeled) Transfers: Sit to/from Stand Sit to Stand: Supervision         General transfer comment: UE use to stand  Ambulation/Gait Ambulation/Gait assistance: Min guard;Min assist Ambulation Distance (Feet): 200 Feet Assistive device: Rolling walker (2 wheeled) Gait Pattern/deviations: Shuffle;Wide base of support;Decreased stride length;Step-through pattern     General Gait Details: one to two episodes of knee buckling min A for safety; improved posture with walker adjustment   Stairs            Wheelchair Mobility    Modified Rankin (Stroke Patients Only)       Balance Overall balance assessment: Needs assistance   Sitting balance-Leahy Scale: Good       Standing balance-Leahy Scale: Fair                              Cognition Arousal/Alertness: Awake/alert Behavior During Therapy: WFL for tasks assessed/performed Overall Cognitive Status: Within Functional Limits for tasks assessed                                        Exercises Other Exercises Other Exercises: encouraged incentive spirometer for lung volumes and pt performed x 5 Other Exercises: seated reclined with neck pillow at spine scapular squeezes x 5 w/ 5 sec hold    General Comments General comments (skin integrity, edema, etc.): SpO2 89-92% on 3L O2      Pertinent Vitals/Pain Pain Assessment: No/denies pain    Home Living                      Prior Function            PT Goals (current goals can now be found in the care plan section) Acute Rehab PT Goals Patient Stated Goal: to walk PT Goal Formulation: With patient Time For Goal Achievement: 11/23/17 Potential to Achieve Goals: Good Progress towards PT goals: Progressing toward goals    Frequency    Min 3X/week      PT Plan Current plan remains appropriate    Co-evaluation  AM-PAC PT "6 Clicks" Daily Activity  Outcome Measure  Difficulty turning over in bed (including adjusting bedclothes, sheets and blankets)?: A Little Difficulty moving from lying on back to sitting on the side of the bed? : A Little Difficulty sitting down on and standing up from a chair with arms (e.g., wheelchair, bedside commode, etc,.)?: A Little Help needed moving to and from a bed to chair (including a wheelchair)?: A Little Help needed walking in hospital room?: A Little Help needed climbing 3-5 steps with a railing? : A Lot 6 Click Score: 17    End of Session Equipment Utilized During Treatment: Gait belt;Oxygen Activity Tolerance: Patient tolerated treatment well Patient left: with call bell/phone within reach;in chair;with family/visitor present   PT Visit Diagnosis: Unsteadiness on feet (R26.81);Other abnormalities of gait  and mobility (R26.89);Muscle weakness (generalized) (M62.81)     Time: 6568-1275 PT Time Calculation (min) (ACUTE ONLY): 25 min  Charges:  $Gait Training: 23-37 mins                    G CodesMagda Kiel, [place] 11/09/2017    Reginia Naas 11/09/2017, 5:16 PM

## 2017-11-09 NOTE — Evaluation (Signed)
Physical Therapy Evaluation Patient Details Name: Oscar Floyd MRN: [id number] DOB: 06/06/1966 Today's Date: 11/09/2017   History of Present Illness  52 y.o. male with medical history significant of [name] on chemotherapy status post brain radiation in 2015, thrombocytopenia comes to the hospital with complains of shortness of breath.  Dx of pulmonary embolism.  Clinical Impression  Patient presents with decreased mobility due to weakness, decreased cardiopulmonary endurance, decreased balance and will benefit from skilled PT in the acute setting to allow return home with intermittent family support and follow up HHPT.     Follow Up Recommendations Home health PT    Equipment Recommendations  Rolling walker with 5" wheels;3in1 (PT)    Recommendations for Other Services       Precautions / Restrictions Precautions Precautions: Fall Precaution Comments: oxygen dependent      Mobility  Bed Mobility               General bed mobility comments: up in chair  Transfers Overall transfer level: Needs assistance Equipment used: Rolling walker (2 wheeled) Transfers: Sit to/from Stand Sit to Stand: Supervision            Ambulation/Gait Ambulation/Gait assistance: Min guard;Supervision Ambulation Distance (Feet): 80 Feet Assistive device: Rolling walker (2 wheeled) Gait Pattern/deviations: Step-to pattern;Step-through pattern;Narrow base of support;Trunk flexed;Shuffle     General Gait Details: slow pace, stopping to breathe several times, flexed likely due to walker too short, demonstrating hip weakness with mild scissoring and weak core  Stairs            Wheelchair Mobility    Modified Rankin (Stroke Patients Only)       Balance Overall balance assessment: Needs assistance   Sitting balance-Leahy Scale: Good       Standing balance-Leahy Scale: Fair                               Pertinent Vitals/Pain Pain  Assessment: No/denies pain    Home Living Family/patient expects to be discharged to:: Private residence Living Arrangements: Spouse/significant other;Children Available Help at Discharge: Family   Home Access: Stairs to enter   Technical brewer of Steps: 2 Home Layout: Two level;Able to live on main level with bedroom/bathroom Home Equipment: None      Prior Function Level of Independence: Independent               Hand Dominance        Extremity/Trunk Assessment   Upper Extremity Assessment Upper Extremity Assessment: Generalized weakness    Lower Extremity Assessment Lower Extremity Assessment: Generalized weakness    Cervical / Trunk Assessment Cervical / Trunk Assessment: Kyphotic  Communication   Communication: No difficulties  Cognition Arousal/Alertness: Awake/alert Behavior During Therapy: WFL for tasks assessed/performed Overall Cognitive Status: Within Functional Limits for tasks assessed                                        General Comments General comments (skin integrity, edema, etc.): SpO2 92% ambulating on 3L O2    Exercises     Assessment/Plan    PT Assessment Patient needs continued PT services  PT Problem List Decreased strength;Decreased mobility;Decreased activity tolerance;Decreased balance;Decreased knowledge of use of DME;Cardiopulmonary status limiting activity       PT Treatment Interventions DME instruction;Functional mobility training;Balance training;Patient/family education;Gait  training;Therapeutic activities;Stair training;Therapeutic exercise    PT Goals (Current goals can be found in the Care Plan section)  Acute Rehab PT Goals Patient Stated Goal: to walk PT Goal Formulation: With patient Time For Goal Achievement: 11/23/17 Potential to Achieve Goals: Good    Frequency Min 3X/week   Barriers to discharge        Co-evaluation               AM-PAC PT "6 Clicks" Daily Activity   Outcome Measure Difficulty turning over in bed (including adjusting bedclothes, sheets and blankets)?: A Little Difficulty moving from lying on back to sitting on the side of the bed? : A Little Difficulty sitting down on and standing up from a chair with arms (e.g., wheelchair, bedside commode, etc,.)?: A Lot Help needed moving to and from a bed to chair (including a wheelchair)?: A Little Help needed walking in hospital room?: A Little Help needed climbing 3-5 steps with a railing? : A Lot 6 Click Score: 16    End of Session Equipment Utilized During Treatment: Gait belt;Oxygen Activity Tolerance: Patient tolerated treatment well Patient left: with call bell/phone within reach;in chair;with family/visitor present   PT Visit Diagnosis: Unsteadiness on feet (R26.81);Other abnormalities of gait and mobility (R26.89);Muscle weakness (generalized) (M62.81)    Time: 6468-0321 PT Time Calculation (min) (ACUTE ONLY): 21 min   Charges:   PT Evaluation $PT Eval Moderate Complexity: 1 Mod     PT G CodesMagda Kiel, [place] [phone] 11/09/2017   Reginia Naas 11/09/2017, 1:22 PM

## 2017-11-09 NOTE — Progress Notes (Signed)
   11/09/17 1400  Clinical Encounter Type  Visited With Patient and family together  Visit Type Follow-up   Checking back in with the patient.  Eldest son and wife present.  Patient stated he is doing better today and was sitting up in the chair.  He continues to have a positive outlook.  Will follow and support as needed. Chaplain Katherene Ponto

## 2017-11-09 NOTE — Progress Notes (Signed)
PROGRESS NOTE    Oscar Floyd  HBZ:[id number] DOB: 10-Jul-1966 DOA: 11/01/2017 PCP: [name], MD   Brief Narrative:  52 year old Caucasian male with a past medical history of [name] on chemotherapy, status post radiation to brain in 2015, thrombocytopenia presented with shortness of breath.  Evaluation revealed acute pulmonary embolism and now likely has a CAP.  Due to his severe thrombocytopenia anticoagulation was not initiated initially butafter discussions with patient's oncologist as well as pulmonology it was started on 2/25. Continues to remain SOB and requiring O2.   Assessment & Plan:   Active Problems:   Pulmonary embolism (HCC)  Acute Pulmonary Embolism with Hypoxia -Patient with history of malignancy and likely has hypercoagulable state as a result. -Presented with shortness of breath and CT angiogram suggested acute pulmonary embolism at the interlobar pulmonary artery and there could be distal pulmonary emboli obscured by degree of motion artifcat.   -However due to severe thrombocytopenia risk of bleeding was thought to be quite high. Oncology recommended holding off on anticoagulation until his platelet counts become greater than 50,000.   -Patient remained hemodynamically stable.  Occasional symptoms of chest discomfort and shortness of breath.   -Platelet counts had improved and the patient was started on Lovenox in 2/25 at 1 mg/kg BID and will go home on 1.5 mg/kg at D/C -Platelet counts 65,000 today.  Still remains quite symptomatic with Dyspnea.  -CXR 11/08/17 showed There is little change to perhaps minimal improvement in diffuse airspace disease possibly representing pneumonia in this patient with leukocytosis. No definite pleural effusion is seen. Mediastinal and hilar contours are unremarkable. The heart is within normal limits in size. Right-sided Port-A-Cath tip extends to overlie the lower SVC. No bony abnormality is seen. -PESI Score  at Least a Class 3 -Check Bilateral LE Dopplers for clot burden; Negative for DVT -Check ECHOCardiogram for RV Strain  -Continue supplemental oxygen.  PT and OT evaluation.  As needed morphine for pain control  Suspected CAP -poA -CT Scan on 2/19 showed Acute airspace opacity asymmetric to the right. This is primarilyconcerning for infection in this patient with leukocytosis. -CXR 11/08/17 showed There is little change to perhaps minimal improvement in diffuse airspace disease possibly representing pneumonia in this patient with leukocytosis. No definite pleural effusion is seen. Mediastinal and hilar contours are unremarkable. The heart is within normal limits in size. Right-sided Port-A-Cath tip extends to overlie the lower SVC. No bony abnormality is seen. -Patient coughing up Yellowish sputum  -Will D/C po Augmentin and start patient on CAP Coverage with IV Ceftriaxone and IV Azithromycin -C/w Steroid Taper per Oncology as ordered  -C/w Albuterol 2.5 m TID scheduled and q6hprn; Will aslo add Ipratropium TID for DuoNebs TID  -C/w Dulera 2 puff IH BID daily  -Add Flutter Valve, Incentive Spirometry, and Guaifenesin  1200 mg po BID  -Give 20 mg of IV Lasix yesterday for concern of Volume Overload; IVF now D/C'd   Elevated Troponin -Most likely due to Pulmonary embolism. -POC was 0.27 and Troponin I was 0.89   -He is chest pain-free currently.   -EKG did not show any acute changes. -Check ECHOCardiogram   Normocytic Anemia -No evidence of overt bleeding. Hemoglobin has improved posttransfusion. -Hb/Hct remains stable at 9.2/26.9 -Continue to Monitor for S/Sx of Bleeding as patient is on full dose Anticoagulation with Lovenox -Repeat CMP in AM   Leukocytosis -Leukocytosis most likely to PEGfilgrastim given on 2/9.   -WBC continues to improve but has  essentially stabilized -WBC went from 32.1 -> 15.8 -Suspect some degree of Leukocytosis from PNA vs. Steroid Demargination  -Repeat  CXR in AM   Thrombocytopenia -Most likely due to chemotherapy.   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   -Status post brain radiation in 2015.   -Plan is for referral to [facility] for second opinion.   -Noted to be on steroids which will be continued per Oncology; Now on 60 mg po Daily x4 days  -Pain Control with Morphine 2 mg IV q3hprn, and Norco/Vicodin 1-2 tab po q4hprn Moderate Pain  -C/w Bowel Regimen   DVT prophylaxis: Anticoagulated with Lovenox 1 mg/kg BID Code Status: FULL CODE Family Communication: Discussed with Wife at Bedside  Disposition Plan: Anticipate D/C Home with Home Health PT with RW with 5" Wheels and 3 in 1 Bedside Commode in next 24-48 hours   Consultants:   Oncology  Pulmonary via Phone Consultation    Procedures:  LOWER EXTREMITY DUPLEX Bilateral lower extremity venous duplex has been completed. Negative for DVT.  ECHOCARDIOGRAM (ordered)   Antimicrobials: Anti-infectives (From admission, onward)   Start     Dose/Rate Route Frequency Ordered Stop   11/09/17 1500  cefTRIAXone (ROCEPHIN) 1 g in sodium chloride 0.9 % 100 mL IVPB     1 g 200 mL/hr over 30 Minutes Intravenous Every 24 hours 11/09/17 1441     11/09/17 1500  azithromycin (ZITHROMAX) 500 mg in sodium chloride 0.9 % 250 mL IVPB     500 mg 250 mL/hr over 60 Minutes Intravenous Every 24 hours 11/09/17 1441     11/08/17 1800  amoxicillin-clavulanate (AUGMENTIN) 875-125 MG per tablet 1 tablet  Status:  Discontinued     1 tablet Oral Every 12 hours 11/08/17 1739 11/09/17 1441     Subjective: Seen and examined and stated he did not feel significantly better. Still remains very dyspneic. Wanting to work with PT. No CP today  but still wearing O2 via .   Objective: Vitals:   11/09/17 0513 11/09/17 0739 11/09/17 1340 11/09/17 1352  BP: 107/71  115/73   Pulse: 81  90   Resp: 20  18   Temp: 98.3 F (36.8 C)  98 F (36.7 C)   TempSrc: Oral  Oral   SpO2: 97% 100% 98% 98%  Weight: 86.4 kg (190 lb 7.6 oz)     Height:        Intake/Output Summary (Last 24 hours) at 11/09/2017 1500 Last data filed at 11/09/2017 1341 Gross per 24 hour  Intake 250 ml  Output 1700 ml  Net -1450 ml   Filed Weights   11/07/17 0544 11/08/17 0426 11/09/17 0513  Weight: 89 kg (196 lb 1.6 oz) 88.4 kg (194 lb 12.8 oz) 86.4 kg (190 lb 7.6 oz)   Examination: Physical Exam:  Constitutional: WN/WD Caucasian male in NAD and appears calm and comfortable Eyes: Lids and conjunctivae normal, sclerae anicteric  ENMT: External Ears, Nose appear normal. Grossly normal hearing. Mucous membranes are moist. Posterior pharynx clear of any exudate or lesions. Normal dentition.  Neck: Appears normal, supple, no cervical masses, normal ROM, no appreciable thyromegaly, no JVD Respiratory: Diminished to auscultation bilaterally with some rhonchi and coarse breath sounds. Mild crackles appreciated. Normal respiratory  effort and patient is not tachypenic. No accessory muscle use but is wearing Supplemental O2 via [place].  Cardiovascular: RRR, no murmurs / rubs / gallops. S1 and S2 auscultated. No extremity edema.  Chest Wall: Has Port-A-Cath in place. Abdomen: Soft, non-tender, non-distended. No masses palpated. No appreciable hepatosplenomegaly. Bowel sounds positive x4.  GU: Deferred. Musculoskeletal: No clubbing / cyanosis of digits/nails. No joint deformity upper and lower extremities. Good ROM, no contractures.  Skin: No rashes, lesions, ulcers on a limited skin evaluation. No induration; Warm and dry.  Neurologic: CN 2-12 grossly intact with no focal deficits. Sensation intact in all 4 Extremities, DTR normal. Strength 5/5 in all 4. Romberg sign  cerebellar reflexes not assessed.  Psychiatric: Normal judgment and insight. Alert and oriented x 3. Normal mood and appropriate affect.   Data Reviewed: I have personally reviewed following labs and imaging studies  CBC: Recent Labs  Lab 11/05/17 0535 11/06/17 0514 11/07/17 0637 11/08/17 0345 11/09/17 0444  WBC 18.1* 17.3* 16.6* 16.0* 15.8*  HGB 9.1* 9.0* 8.9* 8.9* 9.2*  HCT 26.9* 26.5* 26.3* 25.9* 26.9*  MCV 91.8 91.1 91.3 91.5 91.2  PLT 32* 40* 48* 59* 65*   Basic Metabolic Panel: Recent Labs  Lab 11/04/17 0403 11/07/17 0637 11/08/17 0345 11/09/17 0444  NA 144 142 142 141  K 3.9 4.1 4.0 3.5  CL 112* 111 111 108  CO2 21* 22 21* 23  GLUCOSE 96 88 88 82  BUN 14 18 23* 21*  CREATININE 1.09 0.91 1.09 1.03  CALCIUM 7.4* 7.6* 7.7* 7.3*   GFR: Estimated Creatinine Clearance: 100.9 mL/min (by C-G formula based on SCr of 1.03 mg/dL). Liver Function Tests: No results for input(s): AST, ALT, ALKPHOS, BILITOT, PROT, ALBUMIN in the last 168 hours. No results for input(s): LIPASE, AMYLASE in the last 168 hours. No results for input(s): AMMONIA in the last 168 hours. Coagulation Profile: No results for input(s): INR, PROTIME in the last 168 hours. Cardiac Enzymes: No results for input(s): CKTOTAL, CKMB, CKMBINDEX, TROPONINI in the last 168 hours. BNP (last 3 results) No results for input(s): PROBNP in the last 8760 hours. HbA1C: No results for input(s): HGBA1C in the last 72 hours. CBG: No results for input(s): GLUCAP in the last 168 hours. Lipid Profile: No results for input(s): CHOL, HDL, LDLCALC, TRIG, CHOLHDL, LDLDIRECT in the last 72 hours. Thyroid Function Tests: No results for input(s): TSH, T4TOTAL, FREET4, T3FREE, THYROIDAB in the last 72 hours. Anemia Panel: No results for input(s): VITAMINB12, FOLATE, FERRITIN, TIBC, IRON, RETICCTPCT in the last 72 hours. Sepsis Labs: No results for input(s): PROCALCITON, LATICACIDVEN in the last 168 hours.  No results found  for this or any previous visit (from the past 240 hour(s)).   Radiology Studies: Dg Chest Port 1 View  Result Date: 11/08/2017 CLINICAL DATA:  Shortness of breath, follow-up EXAM: PORTABLE CHEST 1 VIEW COMPARISON:  CT chest of 11/01/2017 and chest x-ray of the same day FINDINGS: There is little change to perhaps minimal improvement in diffuse airspace disease possibly representing pneumonia in this patient with leukocytosis. No definite pleural effusion is seen. Mediastinal and hilar contours are unremarkable. The heart is within normal limits in size. Right-sided Port-A-Cath tip extends to overlie the lower SVC. No bony abnormality is seen. IMPRESSION: Little change to slight improvement in diffuse airspace disease. Electronically Signed   By: Ivar Drape M.D.   On: 11/08/2017 15:50   Scheduled Meds: . docusate sodium  100 mg Oral Daily  . enoxaparin (LOVENOX) injection  1 mg/kg Subcutaneous BID  . folic acid  1 mg Oral Daily  . guaiFENesin  1,200 mg Oral BID  . ipratropium-albuterol  3 mL Nebulization TID  . mirtazapine  30 mg Oral QHS  . mometasone-formoterol  2 puff Inhalation BID  . pantoprazole  40 mg Oral Daily  . predniSONE  60 mg Oral Q breakfast   Followed by  . [START ON 11/13/2017] predniSONE  40 mg Oral Q breakfast   Followed by  . [START ON 11/17/2017] predniSONE  20 mg Oral Q breakfast   Followed by  . [START ON 11/21/2017] predniSONE  10 mg Oral Q breakfast   Followed by  . [START ON 11/25/2017] predniSONE  5 mg Oral Q breakfast  . protein supplement shake  11 oz Oral Q24H   Continuous Infusions: . azithromycin    . cefTRIAXone (ROCEPHIN)  IV      LOS: 8 days   Kerney Elbe, DO [facility] Pager [phone]  If 7PM-7AM, please contact night-coverage [email or web address] Password [email or web address] 11/09/2017, 3:00 PM

## 2017-11-10 ENCOUNTER — Other Ambulatory Visit: Payer: BLUE CROSS/BLUE SHIELD

## 2017-11-10 ENCOUNTER — Ambulatory Visit: Payer: BLUE CROSS/BLUE SHIELD | Admitting: Internal Medicine

## 2017-11-10 ENCOUNTER — Inpatient Hospital Stay (HOSPITAL_COMMUNITY): Payer: BLUE CROSS/BLUE SHIELD

## 2017-11-10 ENCOUNTER — Inpatient Hospital Stay: Payer: BLUE CROSS/BLUE SHIELD

## 2017-11-10 DIAGNOSIS — R0602 Shortness of breath: Secondary | ICD-10-CM

## 2017-11-10 DIAGNOSIS — R0609 Other forms of dyspnea: Secondary | ICD-10-CM

## 2017-11-10 LAB — COMPREHENSIVE METABOLIC PANEL
ALBUMIN: 2.7 g/dL — AB (ref 3.5–5.0)
ALT: 141 U/L — AB (ref 17–63)
AST: 82 U/L — AB (ref 15–41)
Alkaline Phosphatase: 602 U/L — ABNORMAL HIGH (ref 38–126)
Anion gap: 9 (ref 5–15)
BUN: 24 mg/dL — AB (ref 6–20)
CHLORIDE: 109 mmol/L (ref 101–111)
CO2: 23 mmol/L (ref 22–32)
CREATININE: 0.86 mg/dL (ref 0.61–1.24)
Calcium: 7.8 mg/dL — ABNORMAL LOW (ref 8.9–10.3)
GFR calc Af Amer: >60 mL/min (ref >60)
GLUCOSE: 88 mg/dL (ref 65–99)
POTASSIUM: 4.2 mmol/L (ref 3.5–5.1)
Sodium: 141 mmol/L (ref 135–145)
Total Bilirubin: 0.8 mg/dL (ref 0.3–1.2)
Total Protein: 5.5 g/dL — ABNORMAL LOW (ref 6.5–8.1)

## 2017-11-10 LAB — MAGNESIUM: MAGNESIUM: 2.1 mg/dL (ref 1.7–2.4)

## 2017-11-10 LAB — CBC WITH DIFFERENTIAL/PLATELET
BASOS ABS: 0 10*3/uL (ref 0.0–0.1)
BASOS PCT: 0 %
EOS PCT: 0 %
Eosinophils Absolute: 0 10*3/uL (ref 0.0–0.7)
HEMATOCRIT: 25.2 % — AB (ref 39.0–52.0)
Hemoglobin: 8.6 g/dL — ABNORMAL LOW (ref 13.0–17.0)
LYMPHS PCT: 7 %
Lymphs Abs: 1.1 10*3/uL (ref 0.7–4.0)
MCH: 31.2 pg (ref 26.0–34.0)
MCHC: 34.1 g/dL (ref 30.0–36.0)
MCV: 91.3 fL (ref 78.0–100.0)
Monocytes Absolute: 0.9 10*3/uL (ref 0.1–1.0)
Monocytes Relative: 6 %
NEUTROS ABS: 12.5 10*3/uL — AB (ref 1.7–7.7)
Neutrophils Relative %: 87 %
PLATELETS: 63 10*3/uL — AB (ref 150–400)
RBC: 2.76 MIL/uL — ABNORMAL LOW (ref 4.22–5.81)
RDW: 16.5 % — AB (ref 11.5–15.5)
WBC: 14.5 10*3/uL — ABNORMAL HIGH (ref 4.0–10.5)

## 2017-11-10 LAB — ECHOCARDIOGRAM COMPLETE
AVLVOTPG: 7 mmHg
Ao-asc: 31 cm
E decel time: 246 msec
E/e' ratio: 8.16
FS: 37 % (ref 28–44)
Height: 74 in
IV/PV OW: 1.1
LA vol index: 13.3 mL/m2
LA vol: 28.5 mL
LADIAMINDEX: 1.44 cm/m2
LASIZE: 31 mm
LAVOLA4C: 22.1 mL
LEFT ATRIUM END SYS DIAM: 31 mm
LV PW d: 15.6 mm — AB (ref 0.6–1.1)
LV TDI E'LATERAL: 8.92
LV dias vol index: 31 mL/m2
LV dias vol: 66 mL (ref 62–150)
LV e' LATERAL: 8.92 cm/s
LV sys vol index: 12 mL/m2
LVEEAVG: 8.16
LVEEMED: 8.16
LVOT SV: 60 mL
LVOT VTI: 21 cm
LVOT area: 2.84 cm2
LVOT diameter: 19 mm
LVOT peak vel: 132 cm/s
LVSYSVOL: 27 mL (ref 21–61)
MV Dec: 246
MVPG: 2 mmHg
MVPKAVEL: 86.8 m/s
MVPKEVEL: 72.8 m/s
RV LATERAL S' VELOCITY: 20.4 cm/s
Simpson's disk: 59
Stroke v: 39 ml
TAPSE: 14.8 mm
TDI e' medial: 6.85
WEIGHTICAEL: 3132.3 [oz_av]

## 2017-11-10 LAB — PHOSPHORUS: Phosphorus: 3.8 mg/dL (ref 2.5–4.6)

## 2017-11-10 LAB — MRSA PCR SCREENING: MRSA by PCR: NEGATIVE

## 2017-11-10 MED ORDER — FUROSEMIDE 10 MG/ML IJ SOLN
20.0000 mg | Freq: Once | INTRAMUSCULAR | Status: AC
  Administered 2017-11-10: 20 mg via INTRAVENOUS
  Filled 2017-11-10: qty 2

## 2017-11-10 MED ORDER — LEVOFLOXACIN IN D5W 750 MG/150ML IV SOLN
750.0000 mg | INTRAVENOUS | Status: DC
  Administered 2017-11-10 – 2017-11-12 (×3): 750 mg via INTRAVENOUS
  Filled 2017-11-10 (×5): qty 150

## 2017-11-10 NOTE — Progress Notes (Signed)
Physical Therapy Treatment Patient Details Name: Oscar Floyd MRN: [id number] DOB: 04-27-1966 Today's Date: 11/10/2017    History of Present Illness 52 y.o. male with medical history significant of [name] on chemotherapy status post brain radiation in 2015, thrombocytopenia comes to the hospital with complains of shortness of breath.  Dx of pulmonary embolism.    PT Comments    Progressing well, motivated to work with PT; would benefit from amb with nursing staff as well as PT to incr activity tol   Follow Up Recommendations  Home health PT     Equipment Recommendations  Rolling walker with 5" wheels;3in1 (PT)    Recommendations for Other Services       Precautions / Restrictions Precautions Precautions: Fall Precaution Comments: oxygen dependent Restrictions Weight Bearing Restrictions: No    Mobility  Bed Mobility Overal bed mobility: Modified Independent                Transfers Overall transfer level: Needs assistance Equipment used: Rolling walker (2 wheeled) Transfers: Sit to/from Stand Sit to Stand: Supervision         General transfer comment: cues for UE placement and to control descent  Ambulation/Gait Ambulation/Gait assistance: Min guard;Min assist Ambulation Distance (Feet): 100 Feet Assistive device: Rolling walker (2 wheeled) Gait Pattern/deviations: Step-through pattern;Decreased stride length;Decreased dorsiflexion - left;Wide base of support     General Gait Details: no knee buckling today, cues for incr df on L and heel strike as well as incr trunk/thoracic extension; one brief standing rest; 3/4 DOE with SpO2= 95-97% on 2L, HR max of 124   Stairs            Wheelchair Mobility    Modified Rankin (Stroke Patients Only)       Balance     Sitting balance-Leahy Scale: Good       Standing balance-Leahy Scale: Fair                              Cognition Arousal/Alertness:  Awake/alert Behavior During Therapy: WFL for tasks assessed/performed Overall Cognitive Status: Within Functional Limits for tasks assessed                                        Exercises General Exercises - Lower Extremity Long Arc Quad: AROM;Both;10 reps;Seated Toe Raises: AROM;Strengthening;Both;10 reps;Seated Heel Raises: AROM;Strengthening;Both;10 reps;Seated    General Comments        Pertinent Vitals/Pain      Home Living                      Prior Function            PT Goals (current goals can now be found in the care plan section) Acute Rehab PT Goals Patient Stated Goal: to walk, get out of hospital PT Goal Formulation: With patient Time For Goal Achievement: 11/23/17 Potential to Achieve Goals: Good Progress towards PT goals: Progressing toward goals    Frequency    Min 3X/week      PT Plan Current plan remains appropriate    Co-evaluation              AM-PAC PT "6 Clicks" Daily Activity  Outcome Measure  Difficulty turning over in bed (including adjusting bedclothes, sheets and blankets)?: A Little Difficulty moving from lying  on back to sitting on the side of the bed? : A Little Difficulty sitting down on and standing up from a chair with arms (e.g., wheelchair, bedside commode, etc,.)?: A Little Help needed moving to and from a bed to chair (including a wheelchair)?: A Little Help needed walking in hospital room?: A Little Help needed climbing 3-5 steps with a railing? : A Lot 6 Click Score: 17    End of Session Equipment Utilized During Treatment: Gait belt;Oxygen Activity Tolerance: Patient tolerated treatment well Patient left: in chair;with call bell/phone within reach   PT Visit Diagnosis: Unsteadiness on feet (R26.81);Other abnormalities of gait and mobility (R26.89);Muscle weakness (generalized) (M62.81)     Time: 9622-2979 PT Time Calculation (min) (ACUTE ONLY): 28 min  Charges:  $Gait Training:  23-37 mins                    G CodesKenyon Ana, PT Pager: [phone] 11/10/2017    Kenyon Ana 11/10/2017, 11:42 AM

## 2017-11-10 NOTE — Progress Notes (Signed)
Received pt in stable condition. No signs symptoms of distress. Agree with previous RN assessment

## 2017-11-10 NOTE — Evaluation (Addendum)
Occupational Therapy Evaluation Patient Details Name: Oscar Floyd MRN: [id number] DOB: Feb 21, 1966 Today's Date: 11/10/2017    History of Present Illness 52 y.o. male with medical history significant of [name] on chemotherapy status post brain radiation in 2015, thrombocytopenia comes to the hospital with complains of shortness of breath.  Dx of pulmonary embolism.   Clinical Impression   Pt was admitted for the above.  He is usually independent with adls.  Will follow in acute setting emphasizing energy conservation and educating on safe bathroom transfers    Follow Up Recommendations  Supervision/Assistance - 24 hour    Equipment Recommendations  3 in 1 bedside commode    Recommendations for Other Services       Precautions / Restrictions Precautions Precautions: Fall Restrictions Weight Bearing Restrictions: No      Mobility Bed Mobility               General bed mobility comments: OOB  Transfers   Equipment used: Rolling walker (2 wheeled)   Sit to Stand: Supervision         General transfer comment: cues for UE placement    Balance                                           ADL either performed or assessed with clinical judgement   ADL Overall ADL's : Needs assistance/impaired Eating/Feeding: Independent   Grooming: Supervision/safety;Standing   Upper Body Bathing: Set up;Sitting   Lower Body Bathing: Min guard;Sit to/from stand   Upper Body Dressing : Minimal assistance;Sitting(lines)   Lower Body Dressing: Minimal assistance;Sit to/from stand   Toilet Transfer: Ambulation;Min guard;RW   Toileting- Water quality scientist and Hygiene: Min guard;Sit to/from stand         General ADL Comments: Pt would benefit from 3:1.  He has a built in seat in the shower, but states water won't reach that far.  PT has already recommended a 3:1. Explained that this could also be used, but legs would have to  be wiped down.  He needs the walker for support at this time     Vision         Perception     Praxis      Pertinent Vitals/Pain       Hand Dominance     Extremity/Trunk Assessment Upper Extremity Assessment Upper Extremity Assessment: Generalized weakness           Communication Communication Communication: No difficulties   Cognition Arousal/Alertness: Awake/alert Behavior During Therapy: WFL for tasks assessed/performed                                   General Comments: decreased memory; wife in room; his answers sometimes differnt   General Comments  pt wanted to walk in hall.  On 1 liter; sats 95-96% but dyspnea was 2/4. Took several standing breaks. Min guard for safety  Encouraged rest breaks and pursed lip breathing    Exercises     Shoulder Instructions      Home Living Family/patient expects to be discharged to:: Private residence Living Arrangements: Spouse/significant other;Children Available Help at Discharge: Family               Bathroom Shower/Tub: Occupational psychologist: Standard  Home Equipment: None          Prior Functioning/Environment Level of Independence: Independent                 OT Problem List: Decreased strength;Decreased activity tolerance;Cardiopulmonary status limiting activity;Decreased knowledge of use of DME or AE      OT Treatment/Interventions: Self-care/ADL training;DME and/or AE instruction;Patient/family education;Balance training;Energy conservation    OT Goals(Current goals can be found in the care plan section) Acute Rehab OT Goals Patient Stated Goal: to walk, get out of hospital OT Goal Formulation: With patient Time For Goal Achievement: 11/24/17 Potential to Achieve Goals: Good ADL Goals Pt Will Transfer to Toilet: with supervision;bedside commode;ambulating Pt Will Perform Tub/Shower Transfer: with min guard assist;Shower transfer;3 in 1 Additional ADL  Goal #1: pt will initiate at least one rest break when he experiences dyspnea for energy conservation  OT Frequency: Min 2X/week   Barriers to D/C:            Co-evaluation              AM-PAC PT "6 Clicks" Daily Activity     Outcome Measure Help from another person eating meals?: None Help from another person taking care of personal grooming?: A Little Help from another person toileting, which includes using toliet, bedpan, or urinal?: A Little Help from another person bathing (including washing, rinsing, drying)?: A Little Help from another person to put on and taking off regular upper body clothing?: A Little Help from another person to put on and taking off regular lower body clothing?: A Little 6 Click Score: 19   End of Session    Activity Tolerance: Patient tolerated treatment well Patient left: in chair;with call bell/phone within reach;with family/visitor present  OT Visit Diagnosis: Unsteadiness on feet (R26.81)                Time: 2820-6015 OT Time Calculation (min): 23 min Charges:  OT General Charges $OT Visit: 1 Visit OT Evaluation $OT Eval Low Complexity: 1 Low G-Codes:     [place], OTR/L [phone] 11/10/2017  Arnett Duddy 11/10/2017, 4:02 PM

## 2017-11-10 NOTE — Progress Notes (Signed)
Pharmacy Antibiotic Note  Oscar Floyd is a 52 y.o. male with [name] on chemotherapy treatment presented to the ED on 11/01/2017 with SOB.  Ceftriaxone and azithromycin started on 2/27 for PNA.  To change abx to levaquin on 2/28 for suspected HCAP.  2/28 CXR: Persistent but improved diffuse bilateral pulmonary infiltrates/edema  Plan: - levaquin 750 mg IV q24h - with good renal function, pharmacy will sign off for levaquin.  Re-consult [place] if need further assistance  _____________________________  Height: 6\' 2"  (188 cm) Weight: 195 lb 12.3 oz (88.8 kg) IBW/kg (Calculated) : 82.2  Temp (24hrs), Avg:97.7 F (36.5 C), Min:97.5 F (36.4 C), Max:97.9 F (36.6 C)  Recent Labs  Lab 11/04/17 0403  11/06/17 0514 11/07/17 0637 11/08/17 0345 11/09/17 0444 11/10/17 0439  WBC 22.3*   < > 17.3* 16.6* 16.0* 15.8* 14.5*  CREATININE 1.09  --   --  0.91 1.09 1.03 0.86   < > = values in this interval not displayed.    Estimated Creatinine Clearance: 120.8 mL/min (by C-G formula based on SCr of 0.86 mg/dL).    No Known Allergies   Thank you for allowing pharmacy to be a part of this patient's care.  Lynelle Doctor 11/10/2017 4:17 PM

## 2017-11-10 NOTE — Care Management Note (Signed)
Case Management Note  Patient Details  Name: Oscar Floyd MRN: [id number] Date of Birth: 04-Sep-1966  Subjective/Objective: PT recc HHPT,rw-[facility] chosen by patient-rep Santiago Glad aware of HHPT,rw order(will deliver rw to rm prior d/c)                   Action/Plan:d/c home w/HHC/dme   Expected Discharge Date:                  Expected Discharge Plan:  [place]  In-House Referral:     Discharge planning Services  CM Consult  Post Acute Care Choice:    Choice offered to:  Patient  DME Arranged:  Walker rolling DME Agency:  [place] Arranged:    [place]:  [place]  Status of Service:  Completed, signed off  If discussed at [place] of Stay Meetings, dates discussed:    Additional Comments:  Dessa Phi, RN 11/10/2017, 11:30 AM

## 2017-11-10 NOTE — Progress Notes (Signed)
  Echocardiogram 2D Echocardiogram has been performed.  Oscar Floyd 11/10/2017, 10:24 AM

## 2017-11-10 NOTE — Progress Notes (Signed)
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  Evaluation revealed acute pulmonary embolism and now likely has a CAP.  Due to his severe thrombocytopenia anticoagulation was not initiated initially butafter discussions with patient's oncologist as well as Pulmonology it was started on 2/25. Continues to remain SOB and requiring O2. IV Abx changed to Levofloxacin.   Assessment & Plan:   Active Problems:   Pulmonary embolism (HCC)  Acute Pulmonary Embolism with Hypoxia -Patient with history of malignancy and likely has hypercoagulable state as a result.  -Presented with shortness of breath and CT angiogram suggested acute pulmonary embolism at the interlobar pulmonary artery and there could be distal pulmonary emboli obscured by degree of motion artifcat.   -However due to severe thrombocytopenia risk of bleeding was thought to be quite high. Oncology recommended holding off on anticoagulation until his platelet counts become greater than 50,000.   -Patient remained hemodynamically stable.  Occasional symptoms of chest discomfort and shortness of breath.   -Platelet counts had improved and the patient was started on Lovenox in 2/25 at 1 mg/kg BID and will go home on 1.5 mg/kg at D/C -Platelet counts 63,000 today.  Still remains quite symptomatic with Dyspnea.  -CXR 11/08/17 showed There is little change to perhaps minimal improvement in diffuse airspace disease possibly representing pneumonia in this patient with leukocytosis. No definite pleural effusion is seen. Mediastinal and hilar contours are unremarkable. The heart is within normal limits in size. Right-sided Port-A-Cath tip  extends to overlie the lower SVC. No bony abnormality is seen. -CXR today showed persistent but improved diffuse bilateral pulmonary infiltrates/edema and Blastic changes noted in the bony structures consistent with known blastic metastatic disease with known lung cancer  -PESI Score at Least a Class 3 -Check Bilateral LE Dopplers for clot burden; Negative for DVT -Checked ECHOCardiogram for RV Strain and did not show any evidence  -Continue supplemental oxygen.   -PT and OT evaluation recommending Home Health PT/OT.  -C/w As needed morphine for pain control  Suspected CAP in the setting of Lung Cancer  -poA -CT Scan on 2/19 showed Acute airspace opacity asymmetric to the right. This is primarilyconcerning for infection in this patient with leukocytosis. -CXR 11/08/17 showed There is little change to perhaps minimal improvement in diffuse airspace disease possibly representing pneumonia in this patient with leukocytosis. No definite pleural effusion is seen. Mediastinal and hilar contours are unremarkable. The heart is within normal limits in size. Right-sided Port-A-Cath tip extends to overlie the lower SVC. No bony abnormality is seen. -Patient coughing up Yellowish sputum  -Will D/C po Augmentin and start patient on CAP Coverage with IV Ceftriaxone and IV Azithromycin; Will be changing CAP coverage as patient has been in Hospital >48 hours to Monotherapy with Levofloxacin and appreciate Pharmacy to Consult   -C/w Steroid Taper per Oncology as ordered  -C/w Albuterol 2.5 m TID scheduled and q6hprn; Will aslo add Ipratropium TID for DuoNebs TID  -C/w Dulera 2 puff IH BID daily  -Add Flutter Valve, Incentive Spirometry, and Guaifenesin  1200 mg po BID  -Give 20 mg of IV Lasix previously for concern of Volume Overload and will give 20 mg IV Lasix this AM and  this Evening ; IVF now D/C'd   Elevated Troponin -Most likely due to Pulmonary embolism. -POC was 0.27 and Troponin I was 0.89   -He is  chest pain-free currently.   -EKG did not show any acute changes. -Checked ECHOCardiogram and showed EF of 60-65$ with normal wall motion and no regional Wall Motion Abnormalities   Normocytic Anemia -No evidence of overt bleeding. Hemoglobin has improved posttransfusion. -Hb/Hct went from 9.2/26.9 -> 8.6/25.2 -Continue to Monitor for S/Sx of Bleeding as patient is on full dose Anticoagulation with Lovenox -Repeat CMP in AM   Leukocytosis -Leukocytosis most likely to PEGfilgrastim given on 2/9.   -WBC continues to improve but has essentially stabilized -WBC went from 32.1 -> 15.8 -> 14.5 -Suspect some degree of Leukocytosis from PNA vs. Steroid Demargination  -Repeat CXR in AM   Thrombocytopenia -Most likely due to chemotherapy.   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   -Status post brain radiation in 2015.   -Plan is for referral to [facility] for second opinion.   -Noted to be on steroids which will be continued per Oncology; Seen orders per Oncology  -Pain Control with Morphine 2 mg IV q3hprn, and Norco/Vicodin 1-2 tab po q4hprn Moderate Pain  -C/w Bowel Regimen   Abnormal LFT's -? Likely in the setting of Metastasis vs. Infection -AST was 82 and ALT was 141; On admission AST was 42 and ALT was 44 -Continue to Monitor and Repeat CMP in  AM and if trending up will get RUQ Ultrasound and Acute Hepatitis Panel -Repeat CMP in AM   DVT prophylaxis: Anticoagulated with Lovenox 1 mg/kg BID Code Status: FULL CODE Family Communication: Discussed with Wife at Bedside  Disposition Plan: Anticipate D/C Home with Home Health PT with RW with 5" Wheels and 3 in 1 Bedside Commode in next 24-48 hours   Consultants:    Oncology  Pulmonary via Phone Consultation    Procedures:  LOWER EXTREMITY DUPLEX Bilateral lower extremity venous duplex has been completed. Negative for DVT.  ECHOCARDIOGRAM  ------------------------------------------------------------------- Study Conclusions  - Left ventricle: The cavity size was normal. Wall thickness was   normal. Systolic function was normal. The estimated ejection   fraction was in the range of 60% to 65%. Wall motion was normal;   there were no regional wall motion abnormalities. Doppler   parameters are consistent with abnormal left ventricular   relaxation (grade 1 diastolic dysfunction). - Pericardium, extracardiac: A small, free-flowing pericardial   effusion was identified circumferential to the heart. There was   no evidence of hemodynamic compromise.   Antimicrobials: Anti-infectives (From admission, onward)   Start     Dose/Rate Route Frequency Ordered Stop   11/09/17 1500  cefTRIAXone (ROCEPHIN) 1 g in sodium chloride 0.9 % 100 mL IVPB     1 g 200 mL/hr over 30 Minutes Intravenous Every 24 hours 11/09/17 1441     11/09/17 1500  azithromycin (ZITHROMAX) 500 mg in sodium chloride 0.9 % 250 mL IVPB     500 mg 250 mL/hr over 60 Minutes Intravenous Every 24 hours 11/09/17 1441     11/08/17 1800  amoxicillin-clavulanate (AUGMENTIN) 875-125 MG per tablet 1 tablet  Status:  Discontinued     1 tablet Oral Every  12 hours 11/08/17 1739 11/09/17 1441     Subjective: Seen and examined and stated he was happy to walk the halls. Still felt SOB but thinks he may be improving slightly. No CP. No lightheadedness or dizziness.   Objective: Vitals:   11/10/17 0808 11/10/17 0810 11/10/17 1330 11/10/17 1422  BP:   111/77   Pulse:   89   Resp:   18   Temp:   (!) 97.5 F (36.4 C)   TempSrc:   Oral   SpO2: 94% 94% 99% 96%  Weight:      Height:        Intake/Output Summary (Last 24 hours) at 11/10/2017 1604 Last data filed at 11/10/2017 1332 Gross  per 24 hour  Intake 260 ml  Output 3325 ml  Net -3065 ml   Filed Weights   11/08/17 0426 11/09/17 0513 11/10/17 0550  Weight: 88.4 kg (194 lb 12.8 oz) 86.4 kg (190 lb 7.6 oz) 88.8 kg (195 lb 12.3 oz)   Examination: Physical Exam:  Constitutional: WN/WD Caucasian male who appears in NAD and is calm sitting up in bed watching television.  Eyes: Sclerae anicteric. Lids normal ENMT: External Ears and nose appear normal. Grossly normal hearing. MMM Neck: Supple with no JVD Respiratory: Diminished to Auscultation bilaterally; Has some rhonchi and coarse breath sounds and mild crackles appreciated. Normal respiratory effort but is wearing Supplemental O2 via [place] Cardiovascular: RRR; No appreciable m/r/g. No extremity edema Chest Wall: Has a Port-A-Cath in place on Right side Abdomen: Soft, NT, ND. Bowel sounds present  GU: Deferred Musculoskeletal: No contractures; No cyanosis Skin: Warm and Dry.  No appreciable rashes or lesions on a limited skin eval Neurologic: CN 2-12 grossly intact. No appreciable focal deficits Psychiatric: Normal mood and affect. Intact judgement and insight. Alert and Oriented x 3.  Data Reviewed: I have personally reviewed following labs and imaging studies  CBC: Recent Labs  Lab 11/06/17 0514 11/07/17 0637 11/08/17 0345 11/09/17 0444 11/10/17 0439  WBC 17.3* 16.6* 16.0* 15.8* 14.5*  NEUTROABS  --   --   --   --  12.5*  HGB 9.0* 8.9* 8.9* 9.2* 8.6*  HCT 26.5* 26.3* 25.9* 26.9* 25.2*  MCV 91.1 91.3 91.5 91.2 91.3  PLT 40* 48* 59* 65* 63*   Basic Metabolic Panel: Recent Labs  Lab 11/04/17 0403 11/07/17 0637 11/08/17 0345 11/09/17 0444 11/10/17 0439  NA 144 142 142 141 141  K 3.9 4.1 4.0 3.5 4.2  CL 112* 111 111 108 109  CO2 21* 22 21* 23 23  GLUCOSE 96 88 88 82 88  BUN 14 18 23* 21* 24*  CREATININE 1.09 0.91 1.09 1.03 0.86  CALCIUM 7.4* 7.6* 7.7* 7.3* 7.8*  MG  --   --   --   --  2.1  PHOS  --   --   --   --  3.8   GFR: Estimated  Creatinine Clearance: 120.8 mL/min (by C-G formula based on SCr of 0.86 mg/dL). Liver Function Tests: Recent Labs  Lab 11/10/17 0439  AST 82*  ALT 141*  ALKPHOS 602*  BILITOT 0.8  PROT 5.5*  ALBUMIN 2.7*   No results for input(s): LIPASE, AMYLASE in the last 168 hours. No results for input(s): AMMONIA in the last 168 hours. Coagulation Profile: No results for input(s): INR, PROTIME in the last 168 hours. Cardiac Enzymes: No results for input(s): CKTOTAL, CKMB, CKMBINDEX, TROPONINI in the last 168 hours. BNP (last 3 results) No results for input(s):  PROBNP in the last 8760 hours. HbA1C: No results for input(s): HGBA1C in the last 72 hours. CBG: No results for input(s): GLUCAP in the last 168 hours. Lipid Profile: No results for input(s): CHOL, HDL, LDLCALC, TRIG, CHOLHDL, LDLDIRECT in the last 72 hours. Thyroid Function Tests: No results for input(s): TSH, T4TOTAL, FREET4, T3FREE, THYROIDAB in the last 72 hours. Anemia Panel: No results for input(s): VITAMINB12, FOLATE, FERRITIN, TIBC, IRON, RETICCTPCT in the last 72 hours. Sepsis Labs: No results for input(s): PROCALCITON, LATICACIDVEN in the last 168 hours.  No results found for this or any previous visit (from the past 240 hour(s)).   Radiology Studies: Dg Chest Port 1 View  Result Date: 11/10/2017 CLINICAL DATA:  Shortness of breath. EXAM: PORTABLE CHEST 1 VIEW COMPARISON:  11/08/2017, 11/01/2017.  CT 11/01/2017. FINDINGS: PowerPort catheter with lead tip over the cavoatrial junction. Heart size stable. Persistent but improved diffuse bilateral pulmonary infiltrates/edema. No pleural effusion or pneumothorax. No acute bony abnormality. Reference is made to prior CT report 11/01/2017. Blastic changes noted the bony structures consistent with known blastic metastatic disease in this patient with known lung cancer. No acute bony abnormality. IMPRESSION: 1.  PowerPort catheter stable position. 2. Persistent but improved diffuse  bilateral pulmonary infiltrates/edema. 2. Blastic changes are noted in the bony structures consistent with known blastic metastatic disease in this patient with known metastatic lung cancer. Electronically Signed   By: Marcello Moores  Register   On: 11/10/2017 07:26   Scheduled Meds: . docusate sodium  100 mg Oral Daily  . enoxaparin (LOVENOX) injection  1 mg/kg Subcutaneous BID  . folic acid  1 mg Oral Daily  . guaiFENesin  1,200 mg Oral BID  . ipratropium-albuterol  3 mL Nebulization TID  . mirtazapine  30 mg Oral QHS  . mometasone-formoterol  2 puff Inhalation BID  . pantoprazole  40 mg Oral Daily  . predniSONE  60 mg Oral Q breakfast   Followed by  . [START ON 11/13/2017] predniSONE  40 mg Oral Q breakfast   Followed by  . [START ON 11/17/2017] predniSONE  20 mg Oral Q breakfast   Followed by  . [START ON 11/21/2017] predniSONE  10 mg Oral Q breakfast   Followed by  . [START ON 11/25/2017] predniSONE  5 mg Oral Q breakfast  . protein supplement shake  11 oz Oral Q24H   Continuous Infusions: . azithromycin Stopped (11/09/17 1808)  . cefTRIAXone (ROCEPHIN)  IV Stopped (11/09/17 1621)    LOS: 9 days   Kerney Elbe, DO [facility] Pager [phone]  If 7PM-7AM, please contact night-coverage [email or web address] Password [facility] 11/10/2017, 4:04 PM

## 2017-11-11 ENCOUNTER — Inpatient Hospital Stay (HOSPITAL_COMMUNITY): Payer: BLUE CROSS/BLUE SHIELD

## 2017-11-11 ENCOUNTER — Ambulatory Visit: Payer: BLUE CROSS/BLUE SHIELD | Admitting: Adult Health

## 2017-11-11 DIAGNOSIS — R945 Abnormal results of liver function studies: Secondary | ICD-10-CM

## 2017-11-11 LAB — CBC WITH DIFFERENTIAL/PLATELET
BASOS ABS: 0.1 10*3/uL (ref 0.0–0.1)
Basophils Relative: 1 %
Eosinophils Absolute: 0.1 10*3/uL (ref 0.0–0.7)
Eosinophils Relative: 1 %
HCT: 29 % — ABNORMAL LOW (ref 39.0–52.0)
Hemoglobin: 9.6 g/dL — ABNORMAL LOW (ref 13.0–17.0)
LYMPHS ABS: 1.2 10*3/uL (ref 0.7–4.0)
Lymphocytes Relative: 9 %
MCH: 30.1 pg (ref 26.0–34.0)
MCHC: 33.1 g/dL (ref 30.0–36.0)
MCV: 90.9 fL (ref 78.0–100.0)
MONO ABS: 1 10*3/uL (ref 0.1–1.0)
MONOS PCT: 7 %
NEUTROS ABS: 11.4 10*3/uL — AB (ref 1.7–7.7)
Neutrophils Relative %: 82 %
Platelets: 77 10*3/uL — ABNORMAL LOW (ref 150–400)
RBC: 3.19 MIL/uL — ABNORMAL LOW (ref 4.22–5.81)
RDW: 16.5 % — ABNORMAL HIGH (ref 11.5–15.5)
WBC: 13.8 10*3/uL — ABNORMAL HIGH (ref 4.0–10.5)

## 2017-11-11 LAB — COMPREHENSIVE METABOLIC PANEL
ALT: 142 U/L — ABNORMAL HIGH (ref 17–63)
ANION GAP: 11 (ref 5–15)
AST: 71 U/L — ABNORMAL HIGH (ref 15–41)
Albumin: 3 g/dL — ABNORMAL LOW (ref 3.5–5.0)
Alkaline Phosphatase: 689 U/L — ABNORMAL HIGH (ref 38–126)
BILIRUBIN TOTAL: 1 mg/dL (ref 0.3–1.2)
BUN: 23 mg/dL — ABNORMAL HIGH (ref 6–20)
CALCIUM: 7.6 mg/dL — AB (ref 8.9–10.3)
CO2: 25 mmol/L (ref 22–32)
Chloride: 106 mmol/L (ref 101–111)
Creatinine, Ser: 1.09 mg/dL (ref 0.61–1.24)
GFR calc non Af Amer: >60 mL/min (ref >60)
Glucose, Bld: 85 mg/dL (ref 65–99)
Potassium: 3.6 mmol/L (ref 3.5–5.1)
SODIUM: 142 mmol/L (ref 135–145)
TOTAL PROTEIN: 6.3 g/dL — AB (ref 6.5–8.1)

## 2017-11-11 LAB — MAGNESIUM: Magnesium: 2 mg/dL (ref 1.7–2.4)

## 2017-11-11 LAB — PHOSPHORUS: PHOSPHORUS: 3.3 mg/dL (ref 2.5–4.6)

## 2017-11-11 MED ORDER — FUROSEMIDE 10 MG/ML IJ SOLN
20.0000 mg | Freq: Once | INTRAMUSCULAR | Status: AC
  Administered 2017-11-11: 20 mg via INTRAVENOUS
  Filled 2017-11-11: qty 2

## 2017-11-11 MED ORDER — SODIUM CHLORIDE 0.9 % IV BOLUS (SEPSIS)
1000.0000 mL | Freq: Once | INTRAVENOUS | Status: AC
  Administered 2017-11-11: 1000 mL via INTRAVENOUS

## 2017-11-11 NOTE — Progress Notes (Signed)
Ted hose placed per MD order patient tolerated well.

## 2017-11-11 NOTE — Progress Notes (Signed)
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  Evaluation revealed acute pulmonary embolism and now likely has a CAP.  Due to his severe thrombocytopenia anticoagulation was not initiated initially butafter discussions with patient's oncologist as well as Pulmonology it was started on 2/25. Continues to remain SOB and requiring O2. IV Abx changed to Levofloxacin.   Assessment & Plan:   Active Problems:   Pulmonary embolism (HCC)  Acute Pulmonary Embolism with Hypoxia -Patient with history of malignancy and likely has hypercoagulable state as a result.  -Presented with shortness of breath and CT angiogram suggested acute pulmonary embolism at the interlobar pulmonary artery and there could be distal pulmonary emboli obscured by degree of motion artifcat.   -However due to severe thrombocytopenia risk of bleeding was thought to be quite high. Oncology recommended holding off on anticoagulation until his platelet counts become greater than 50,000.   -Patient remained hemodynamically stable.  Occasional symptoms of chest discomfort and shortness of breath.   -Platelet counts had improved and the patient was started on Lovenox in 2/25 at 1 mg/kg BID and will go home on 1.5 mg/kg at D/C -Platelet counts 77,000 today.  -CXR today showed PowerPort catheter stable position. Diffuse bilateral pulmonary interstitial prominence unchanged from prior exam. Left lung base atelectasis/consolidation unchanged. Blastic bony densities are again noted consistent with known blastic metastatic disease. -PESI Score at Least a Class 3 -Check Bilateral LE Dopplers for clot burden; Negative for  DVT -Checked ECHOCardiogram for RV Strain and did not show any evidence  -Continue supplemental oxygen.*Patient was able to be weaned off of Supplemental O2  -PT and OT evaluation recommending Home Health PT/OT.  -C/w As needed morphine for pain control  Suspected CAP in the setting of Lung Cancer  -poA -CT Scan on 2/19 showed Acute airspace opacity asymmetric to the right. This is primarilyconcerning for infection in this patient with leukocytosis. -CXR 11/08/17 showed There is little change to perhaps minimal improvement in diffuse airspace disease possibly representing pneumonia in this patient with leukocytosis. No definite pleural effusion is seen. Mediastinal and hilar contours are unremarkable. The heart is within normal limits in size. Right-sided Port-A-Cath tip extends to overlie the lower SVC. No bony abnormality is seen. -Patient coughing up Yellowish sputum  -C/w IV Levofloxacin  -C/w Steroid Taper per Oncology as ordered  -C/w Albuterol 2.5 m TID scheduled and q6hprn; Will aslo add Ipratropium TID for DuoNebs TID  -C/w Dulera 2 puff IH BID daily  -Add Flutter Valve, Incentive Spirometry, and Guaifenesin  1200 mg po BID  -IV Lasix stopped given Orthostasis  Orthostatic Hypotension -Likely from Overdiuresis -Patient was symptomatic and BP was 68/38 on standing with HR of 138 -IV Diuretics Stopped -Bolused 1 Liter of NS -TED Hose Applied -ECHO showed EF of 60-65% with G1DD -Repeat Orthostatic Vital Signs in AM   Elevated Troponin -Most likely due to Pulmonary embolism. -POC was 0.27 and Troponin I was 0.89   -He is chest pain-free currently.   -EKG did not show any acute changes. -Checked ECHOCardiogram and showed EF of 60-65$ with normal wall motion and no regional Wall Motion Abnormalities   Normocytic Anemia -No evidence of overt bleeding. Hemoglobin has  improved posttransfusion. -Hb/Hct now 9.6/29.0 -Continue to Monitor for S/Sx of Bleeding as patient is on full  dose Anticoagulation with Lovenox -Repeat CMP in AM   Leukocytosis -Leukocytosis most likely to PEGfilgrastim given on 2/9.   -WBC continues to improve but has essentially stabilized -WBC went from 32.1 -> 15.8 -> 14.5 -> 13.8 -Suspect some degree of Leukocytosis from PNA vs. Steroid Demargination  -Repeat CXR in AM   Thrombocytopenia -Most likely due to chemotherapy.   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   -Status post brain radiation in 2015.   -Plan is for referral to [facility] for second opinion.   -Noted to be on steroids which will be continued per Oncology; Seen orders per Oncology  -Pain Control with Morphine 2 mg IV q3hprn, and Norco/Vicodin 1-2 tab po q4hprn Moderate Pain  -C/w Bowel Regimen  -Follow up with Dr. Earlie Server as an outpatient   Abnormal LFT's -? Likely in the setting of Metastasis vs. Infection -AST was 71 and ALT was 142 today; On admission AST was 42 and ALT was 44 -RUQ U/S done and was negative for gallstones or biliary dilatation. Slightly limited evaluation of the liver as above. No specific abnormality is seen -Repeat CMP in AM   DVT prophylaxis: Anticoagulated with Lovenox 1 mg/kg BID Code Status: FULL CODE Family Communication: No Family at bedside  Disposition Plan: Anticipate D/C Home with Home Health PT with RW with 5" Wheels and 3 in 1 Bedside Commode in next 24-48 hours   Consultants:   Oncology  Pulmonary via Phone Consultation    Procedures:  LOWER EXTREMITY DUPLEX Bilateral lower extremity venous duplex has been completed. Negative for DVT.  ECHOCARDIOGRAM  ------------------------------------------------------------------- Study Conclusions  - Left ventricle:  The cavity size was normal. Wall thickness was   normal. Systolic function was normal. The estimated ejection   fraction was in the range of 60% to 65%. Wall motion was normal;   there were no regional wall motion abnormalities. Doppler   parameters are consistent with abnormal left ventricular   relaxation (grade 1 diastolic dysfunction). - Pericardium, extracardiac: A small, free-flowing pericardial   effusion was identified circumferential to the heart. There was   no evidence of hemodynamic compromise.   Antimicrobials: Anti-infectives (From admission, onward)   Start     Dose/Rate Route Frequency Ordered Stop   11/10/17 1700  levofloxacin (LEVAQUIN) IVPB 750 mg     750 mg 100 mL/hr over 90 Minutes Intravenous Every 24 hours 11/10/17 1623     11/09/17 1500  cefTRIAXone (ROCEPHIN) 1 g in sodium chloride 0.9 % 100 mL IVPB  Status:  Discontinued     1 g 200 mL/hr over 30 Minutes Intravenous Every 24 hours 11/09/17 1441 11/10/17 1613   11/09/17 1500  azithromycin (ZITHROMAX) 500 mg in sodium chloride 0.9 % 250 mL IVPB  Status:  Discontinued     500 mg 250 mL/hr over 60 Minutes Intravenous Every 24 hours 11/09/17 1441 11/10/17 1613   11/08/17 1800  amoxicillin-clavulanate (AUGMENTIN) 875-125 MG per tablet 1 tablet  Status:  Discontinued     1 tablet Oral Every 12 hours 11/08/17 1739 11/09/17 1441     Subjective: Seen and examined and this AM after PT tried  to stand him up and he was diaphoretic and dizzy and felt lightheaded. Improved after Laying and bolus. Was able to be weaned off O2 and was on Room Air today. No CP.   Objective: Vitals:   11/11/17 1205 11/11/17 1208 11/11/17 1453 11/11/17 1502  BP: 100/63 (!) 90/56 96/72   Pulse: 96 (!) 103 92   Resp:      Temp:      TempSrc:      SpO2:    96%  Weight:      Height:        Intake/Output Summary (Last 24 hours) at 11/11/2017 1915 Last data filed at 11/11/2017 1738 Gross per 24 hour  Intake 1270 ml  Output 3250 ml  Net  -1980 ml   Filed Weights   11/09/17 0513 11/10/17 0550 11/11/17 0455  Weight: 86.4 kg (190 lb 7.6 oz) 88.8 kg (195 lb 12.3 oz) 85.7 kg (188 lb 15 oz)   Examination: Physical Exam:  Constitutional: WN/WD [place] male who was slightly diaphoretic after attempting to stand. Improved with laying down and currently in NAD Eyes: Sclerae anicteric. Lids normal ENMT: External Ears and nose appear normal. Grossly normal Hearing Neck: Supple with no JVD Respiratory: Diminished to Auscultation but improved basilar breath sounds. Still had coarse sounds and but no appreciable crackles today. Not wearing supplemental O2 via [place] Cardiovascular: Was slightly tachycardic. No m/r/g. No extremity edema Chest Wall: Port-A-Cath in place Abdomen: Soft, NT, ND. Bowel Sounds present GU: Deferred Musculoskeletal: No contractures; No cyanosis Skin: Warm and Dry. No rashes or lesions on a limited skin eval Neurologic: CN 2-12 grossly intact. No appreciable focal deficits Psychiatric: Normal mood and affect. Intact judgement and insight  Data Reviewed: I have personally reviewed following labs and imaging studies  CBC: Recent Labs  Lab 11/07/17 0637 11/08/17 0345 11/09/17 0444 11/10/17 0439 11/11/17 0458  WBC 16.6* 16.0* 15.8* 14.5* 13.8*  NEUTROABS  --   --   --  12.5* 11.4*  HGB 8.9* 8.9* 9.2* 8.6* 9.6*  HCT 26.3* 25.9* 26.9* 25.2* 29.0*  MCV 91.3 91.5 91.2 91.3 90.9  PLT 48* 59* 65* 63* 77*   Basic Metabolic Panel: Recent Labs  Lab 11/07/17 0637 11/08/17 0345 11/09/17 0444 11/10/17 0439 11/11/17 0458  NA 142 142 141 141 142  K 4.1 4.0 3.5 4.2 3.6  CL 111 111 108 109 106  CO2 22 21* 23 23 25   GLUCOSE 88 88 82 88 85  BUN 18 23* 21* 24* 23*  CREATININE 0.91 1.09 1.03 0.86 1.09  CALCIUM 7.6* 7.7* 7.3* 7.8* 7.6*  MG  --   --   --  2.1 2.0  PHOS  --   --   --  3.8 3.3   GFR: Estimated Creatinine Clearance: 95.3 mL/min (by C-G formula based on SCr of 1.09 mg/dL). Liver Function  Tests: Recent Labs  Lab 11/10/17 0439 11/11/17 0458  AST 82* 71*  ALT 141* 142*  ALKPHOS 602* 689*  BILITOT 0.8 1.0  PROT 5.5* 6.3*  ALBUMIN 2.7* 3.0*   No results for input(s): LIPASE, AMYLASE in the last 168 hours. No results for input(s): AMMONIA in the last 168 hours. Coagulation Profile: No results for input(s): INR, PROTIME in the last 168 hours. Cardiac Enzymes: No results for input(s): CKTOTAL, CKMB, CKMBINDEX, TROPONINI in the last 168 hours. BNP (last 3 results) No results for input(s): PROBNP in the last 8760 hours. HbA1C: No results for input(s): HGBA1C in the last 72 hours. CBG:  No results for input(s): GLUCAP in the last 168 hours. Lipid Profile: No results for input(s): CHOL, HDL, LDLCALC, TRIG, CHOLHDL, LDLDIRECT in the last 72 hours. Thyroid Function Tests: No results for input(s): TSH, T4TOTAL, FREET4, T3FREE, THYROIDAB in the last 72 hours. Anemia Panel: No results for input(s): VITAMINB12, FOLATE, FERRITIN, TIBC, IRON, RETICCTPCT in the last 72 hours. Sepsis Labs: No results for input(s): PROCALCITON, LATICACIDVEN in the last 168 hours.  Recent Results (from the past 240 hour(s))  MRSA PCR Screening     Status: None   Collection Time: 11/10/17  9:20 AM  Result Value Ref Range Status   MRSA by PCR NEGATIVE NEGATIVE Final    Comment:        The GeneXpert MRSA Assay (FDA approved for NASAL specimens only), is one component of a comprehensive MRSA colonization surveillance program. It is not intended to diagnose MRSA infection nor to guide or monitor treatment for MRSA infections. Performed at [facility], [place] [address], [place], [place] [address]      Radiology Studies: Dg Chest Port 1 View  Result Date: 11/11/2017 CLINICAL DATA:  Shortness of breath. Lung cancer. Metastatic disease. EXAM: PORTABLE CHEST 1 VIEW COMPARISON:  11/10/2017. FINDINGS: Right IJ line scratched it right IJ scratched it right scratched it PowerPort  catheter stable position. Heart size stable. Persistent unchanged diffuse pulmonary interstitial prominence. No pleural effusion or pneumothorax. Blastic bony changes noted consistent with known blastic metastatic disease. IMPRESSION: 1.  PowerPort catheter stable position. 2. Diffuse bilateral pulmonary interstitial prominence unchanged from prior exam. Left lung base atelectasis/consolidation unchanged. 3. Blastic bony densities are again noted consistent with known blastic metastatic disease. Electronically Signed   By: Marcello Moores  Register   On: 11/11/2017 07:02   Dg Chest Port 1 View  Result Date: 11/10/2017 CLINICAL DATA:  Shortness of breath. EXAM: PORTABLE CHEST 1 VIEW COMPARISON:  11/08/2017, 11/01/2017.  CT 11/01/2017. FINDINGS: PowerPort catheter with lead tip over the cavoatrial junction. Heart size stable. Persistent but improved diffuse bilateral pulmonary infiltrates/edema. No pleural effusion or pneumothorax. No acute bony abnormality. Reference is made to prior CT report 11/01/2017. Blastic changes noted the bony structures consistent with known blastic metastatic disease in this patient with known lung cancer. No acute bony abnormality. IMPRESSION: 1.  PowerPort catheter stable position. 2. Persistent but improved diffuse bilateral pulmonary infiltrates/edema. 2. Blastic changes are noted in the bony structures consistent with known blastic metastatic disease in this patient with known metastatic lung cancer. Electronically Signed   By: Marcello Moores  Register   On: 11/10/2017 07:26   US Abdomen Limited Ruq  Result Date: 11/11/2017 CLINICAL DATA:  Abnormal LFT EXAM: ULTRASOUND ABDOMEN LIMITED RIGHT UPPER QUADRANT COMPARISON:  CT 10/21/2017 FINDINGS: Gallbladder: No gallstones or wall thickening visualized. No sonographic Murphy sign noted by sonographer. Common bile duct: Diameter: 4 mm Liver: Limited evaluation of the liver due to patient inability to suspend breathing. No definite focal hepatic  abnormality is seen. Portal vein is patent on color Doppler imaging with normal direction of blood flow towards the liver. IMPRESSION: 1. Negative for gallstones or biliary dilatation 2. Slightly limited evaluation of the liver as above. No specific abnormality is seen Electronically Signed   By: Donavan Foil M.D.   On: 11/11/2017 18:22   Scheduled Meds: . docusate sodium  100 mg Oral Daily  . enoxaparin (LOVENOX) injection  1 mg/kg Subcutaneous BID  . folic acid  1 mg Oral Daily  . guaiFENesin  1,200 mg Oral BID  .  ipratropium-albuterol  3 mL Nebulization TID  . mirtazapine  30 mg Oral QHS  . mometasone-formoterol  2 puff Inhalation BID  . pantoprazole  40 mg Oral Daily  . predniSONE  60 mg Oral Q breakfast   Followed by  . [START ON 11/13/2017] predniSONE  40 mg Oral Q breakfast   Followed by  . [START ON 11/17/2017] predniSONE  20 mg Oral Q breakfast   Followed by  . [START ON 11/21/2017] predniSONE  10 mg Oral Q breakfast   Followed by  . [START ON 11/25/2017] predniSONE  5 mg Oral Q breakfast  . protein supplement shake  11 oz Oral Q24H   Continuous Infusions: . levofloxacin (LEVAQUIN) IV 750 mg (11/11/17 1736)    LOS: 10 days   Kerney Elbe, DO [facility] Pager [phone]  If 7PM-7AM, please contact night-coverage [email or web address] Password [email or web address] 11/11/2017, 7:15 PM

## 2017-11-11 NOTE — Progress Notes (Signed)
Physical Therapy Treatment Patient Details Name: Oscar Floyd MRN: [id number] DOB: [birth year] Today's Date: 11/11/2017    History of Present Illness 52 y.o. male with medical history significant of [name] on chemotherapy status post brain radiation in 2015, thrombocytopenia comes to the hospital with complains of shortness of breath.  Dx of pulmonary embolism.    PT Comments    Pt in bed on RA at 96%.  Assisted OOB to amb however after 3 feet pt began c/o MAX "Light headed" and very unsteady stance.  NT called to room and assisted to obtain standing BP of 68/38 and HR 138.  Assisted back to bed and RN called to room.    Follow Up Recommendations  Home health PT     Equipment Recommendations  Rolling walker with 5" wheels;3in1 (PT)    Recommendations for Other Services       Precautions / Restrictions Precautions Precautions: Fall Precaution Comments: monitor vitals Restrictions Weight Bearing Restrictions: No    Mobility  Bed Mobility Overal bed mobility: Needs Assistance Bed Mobility: Supine to Sit;Sit to Supine     Supine to sit: Supervision Sit to supine: Min assist   General bed mobility comments: required increased assist back to bed due to hypotension  Transfers Overall transfer level: Needs assistance Equipment used: Rolling walker (2 wheeled) Transfers: Sit to/from Stand Sit to Stand: Supervision;Min guard         General transfer comment: cues for UE placement  Ambulation/Gait Ambulation/Gait assistance: Min assist Ambulation Distance (Feet): 3 Feet Assistive device: Rolling walker (2 wheeled) Gait Pattern/deviations: Step-through pattern;Decreased stride length;Decreased dorsiflexion - left;Wide base of support Gait velocity: decreased   General Gait Details: pt was only able to amb 3 feet due to Max c/o "light headed".   NT called to room to assist and standing BP taken was 68/38 and HR 138.  Assisted back to bed and RN  called to room   Stairs            Wheelchair Mobility    Modified Rankin (Stroke Patients Only)       Balance                                            Cognition Arousal/Alertness: Awake/alert Behavior During Therapy: WFL for tasks assessed/performed Overall Cognitive Status: Within Functional Limits for tasks assessed                                        Exercises      General Comments        Pertinent Vitals/Pain Pain Assessment: No/denies pain    Home Living                      Prior Function            PT Goals (current goals can now be found in the care plan section) Progress towards PT goals: Progressing toward goals    Frequency    Min 3X/week      PT Plan Current plan remains appropriate    Co-evaluation              AM-PAC PT "6 Clicks" Daily Activity  Outcome Measure  Difficulty turning over in bed (including adjusting  bedclothes, sheets and blankets)?: A Little Difficulty moving from lying on back to sitting on the side of the bed? : A Little Difficulty sitting down on and standing up from a chair with arms (e.g., wheelchair, bedside commode, etc,.)?: A Little Help needed moving to and from a bed to chair (including a wheelchair)?: A Little Help needed walking in hospital room?: A Little Help needed climbing 3-5 steps with a railing? : A Lot 6 Click Score: 17    End of Session Equipment Utilized During Treatment: Gait belt;Oxygen Activity Tolerance: Other (comment)(hypotension) Patient left: in bed;with nursing/sitter in room;with call bell/phone within reach Nurse Communication: (BP) PT Visit Diagnosis: Unsteadiness on feet (R26.81);Other abnormalities of gait and mobility (R26.89);Muscle weakness (generalized) (M62.81)     Time: 1030-1055 PT Time Calculation (min) (ACUTE ONLY): 25 min  Charges:  $Gait Training: 8-22 mins $Therapeutic Activity: 8-22 mins                     G Codes:       Rica Koyanagi  PTA [facility]  Acute  Rehab Pager      [phone]

## 2017-11-12 ENCOUNTER — Inpatient Hospital Stay (HOSPITAL_COMMUNITY): Payer: BLUE CROSS/BLUE SHIELD

## 2017-11-12 LAB — PHOSPHORUS: PHOSPHORUS: 3.2 mg/dL (ref 2.5–4.6)

## 2017-11-12 LAB — COMPREHENSIVE METABOLIC PANEL
ALT: 137 U/L — ABNORMAL HIGH (ref 17–63)
AST: 67 U/L — AB (ref 15–41)
Albumin: 3 g/dL — ABNORMAL LOW (ref 3.5–5.0)
Alkaline Phosphatase: 624 U/L — ABNORMAL HIGH (ref 38–126)
Anion gap: 10 (ref 5–15)
BUN: 20 mg/dL (ref 6–20)
CHLORIDE: 106 mmol/L (ref 101–111)
CO2: 24 mmol/L (ref 22–32)
Calcium: 6.9 mg/dL — ABNORMAL LOW (ref 8.9–10.3)
Creatinine, Ser: 1.05 mg/dL (ref 0.61–1.24)
GFR calc non Af Amer: >60 mL/min (ref >60)
Glucose, Bld: 91 mg/dL (ref 65–99)
POTASSIUM: 3.5 mmol/L (ref 3.5–5.1)
SODIUM: 140 mmol/L (ref 135–145)
Total Bilirubin: 0.8 mg/dL (ref 0.3–1.2)
Total Protein: 6 g/dL — ABNORMAL LOW (ref 6.5–8.1)

## 2017-11-12 LAB — CBC WITH DIFFERENTIAL/PLATELET
BASOS ABS: 0.1 10*3/uL (ref 0.0–0.1)
BASOS PCT: 1 %
EOS ABS: 0 10*3/uL (ref 0.0–0.7)
Eosinophils Relative: 0 %
HCT: 28 % — ABNORMAL LOW (ref 39.0–52.0)
Hemoglobin: 9.3 g/dL — ABNORMAL LOW (ref 13.0–17.0)
Lymphocytes Relative: 9 %
Lymphs Abs: 1.2 10*3/uL (ref 0.7–4.0)
MCH: 30.2 pg (ref 26.0–34.0)
MCHC: 33.2 g/dL (ref 30.0–36.0)
MCV: 90.9 fL (ref 78.0–100.0)
MONOS PCT: 7 %
Monocytes Absolute: 1 10*3/uL (ref 0.1–1.0)
Neutro Abs: 11 10*3/uL — ABNORMAL HIGH (ref 1.7–7.7)
Neutrophils Relative %: 83 %
PLATELETS: 73 10*3/uL — AB (ref 150–400)
RBC: 3.08 MIL/uL — ABNORMAL LOW (ref 4.22–5.81)
RDW: 16.7 % — AB (ref 11.5–15.5)
WBC: 13.3 10*3/uL — ABNORMAL HIGH (ref 4.0–10.5)

## 2017-11-12 LAB — HEPATITIS PANEL, ACUTE
HEP A IGM: NEGATIVE
HEP B S AG: NEGATIVE
Hep B C IgM: NEGATIVE

## 2017-11-12 LAB — MAGNESIUM: Magnesium: 2 mg/dL (ref 1.7–2.4)

## 2017-11-12 MED ORDER — SODIUM CHLORIDE 0.9 % IV BOLUS (SEPSIS)
500.0000 mL | Freq: Once | INTRAVENOUS | Status: AC
  Administered 2017-11-12: 500 mL via INTRAVENOUS

## 2017-11-12 NOTE — Progress Notes (Signed)
Pt ambulated >100 sats ranged from 90-96.

## 2017-11-12 NOTE — Progress Notes (Signed)
PROGRESS NOTE    Oscar Floyd  EQA:[id number] DOB: 09/23/1965 DOA: 11/01/2017 PCP: [name], MD   Brief Narrative:  The patient is a 52 year old Caucasian male with a past medical history of [name] on chemotherapy, status post radiation to brain in 2015, thrombocytopenia and other comorbids whow presented with shortness of breath.  Evaluation revealed acute pulmonary embolism and now likely has a CAP.  Due to his severe thrombocytopenia anticoagulation was not initiated initially butafter discussions with patient's oncologist as well as Pulmonology it was started on 2/25. Continues to remain SOB and requiring O2. IV Abx changed to Levofloxacin.   Assessment & Plan:   Active Problems:   Pulmonary embolism (HCC)  Acute Pulmonary Embolism with Hypoxia -Patient with history of malignancy and likely has hypercoagulable state as a result.  -Presented with shortness of breath and CT angiogram suggested acute pulmonary embolism at the interlobar pulmonary artery and there could be distal pulmonary emboli obscured by degree of motion artifcat.   -However due to severe thrombocytopenia risk of bleeding was thought to be quite high. Oncology recommended holding off on anticoagulation until his platelet counts become greater than 50,000.   -Patient remained hemodynamically stable.  Occasional symptoms of chest discomfort and shortness of breath but not requiring any Home o2 -Platelet counts had improved and the patient was started on Lovenox in 2/25 at 1 mg/kg BID and will go home on 1.5 mg/kg at D/C -Platelet counts 73,000 today.  -CXR today showed Mild improvement in airspace lung opacities when compared to the study dated 11/08/2017. No new abnormalities -PESI Score at Least a Class 3 -Check Bilateral LE Dopplers for clot burden; Negative for DVT -Checked ECHOCardiogram for RV Strain and did not show any evidence  -Continue supplemental oxygen.*Patient was able to be  weaned off of Supplemental O2 and ambulated today without desaturating  -PT and OT evaluation recommending Home Health PT/OT.  -C/w As needed morphine for pain control  Suspected CAP in the setting of Lung Cancer  -poA -CT Scan on 2/19 showed Acute airspace opacity asymmetric to the right. This is primarilyconcerning for infection in this patient with leukocytosis. -CXR showed improvement  -Patient coughing up Yellowish sputum  -C/w IV Levofloxacin and change to po in AM  -C/w Steroid Taper per Oncology as ordered  -C/w Albuterol 2.5 m TID scheduled and q6hprn; Will aslo add Ipratropium TID for DuoNebs TID  -C/w Dulera 2 puff IH BID daily  -Add Flutter Valve, Incentive Spirometry, and Guaifenesin  1200 mg po BID  -IV Lasix stopped given Orthostasis  Orthostatic Hypotension -Likely from Overdiuresis -Patient was symptomatic and BP was 68/38 on standing with HR of 138 -IV Diuretics Stopped -Bolused 1 Liter of NS yesterday and 2 500 mL boluses today  -TED Hose Applied -ECHO showed EF of 60-65% with G1DD -Repeat Orthostatic Vital Signs showed he was still Orthostatic -If not orthostatic can D/C in AM   Elevated Troponin -Most likely due to Pulmonary embolism. -POC was 0.27 and Troponin I was 0.89   -He is chest pain-free currently.   -EKG did not show any acute changes. -Checked ECHOCardiogram and showed EF of 60-65$ with normal wall motion and no regional Wall Motion Abnormalities   Normocytic Anemia -No evidence of overt bleeding. Hemoglobin has improved posttransfusion. -Hb/Hct now 9.3/28.0 -Continue to Monitor for S/Sx of Bleeding as patient is on full dose Anticoagulation with Lovenox -Repeat CMP in AM   Leukocytosis -Leukocytosis most likely to PEGfilgrastim given  on 2/9.   -WBC continues to improve but has essentially stabilized -WBC went from 32.1 -> 15.8 -> 14.5 -> 13.8 -? 13.3 -Suspect some degree of Leukocytosis from PNA vs. Steroid Demargination  -Repeat CXR in  AM   Thrombocytopenia -Most likely due to chemotherapy.   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   -Status post brain radiation in 2015.   -Plan is for referral to [facility] for second opinion.   -Noted to be on steroids which will be continued per Oncology; Seen orders per Oncology  -Pain Control with Morphine 2 mg IV q3hprn, and Norco/Vicodin 1-2 tab po q4hprn Moderate Pain  -C/w Bowel Regimen  -Follow up with Dr. Earlie Server as an outpatient   Abnormal LFT's -? Likely in the setting of Metastasis vs. Infection vs. Medication induced  -AST was 67 and ALT was 137 today; On admission AST was 42 and ALT was 44 -RUQ U/S done and was negative for gallstones or biliary dilatation. Slightly limited evaluation of the liver as above. No specific abnormality is seen -Acute Hepatitis Panel Negative  -Repeat CMP in AM   DVT prophylaxis: Anticoagulated with Lovenox 1 mg/kg BID Code Status: FULL CODE Family Communication: No Family at bedside  Disposition Plan: Anticipate D/C Home with Home Health PT with RW with 5" Wheels and 3 in 1 Bedside Commode in next 24-48 hours   Consultants:   Oncology  Pulmonary via Phone Consultation    Procedures:  LOWER EXTREMITY DUPLEX Bilateral lower extremity venous duplex has been completed. Negative for DVT.  ECHOCARDIOGRAM  ------------------------------------------------------------------- Study Conclusions  - Left ventricle: The cavity size was normal. Wall thickness was   normal. Systolic function was normal. The estimated ejection   fraction was in the range of 60% to 65%. Wall motion was normal;   there were no regional wall motion abnormalities. Doppler   parameters are  consistent with abnormal left ventricular   relaxation (grade 1 diastolic dysfunction). - Pericardium, extracardiac: A small, free-flowing pericardial   effusion was identified circumferential to the heart. There was   no evidence of hemodynamic compromise.   Antimicrobials: Anti-infectives (From admission, onward)   Start     Dose/Rate Route Frequency Ordered Stop   11/10/17 1700  levofloxacin (LEVAQUIN) IVPB 750 mg     750 mg 100 mL/hr over 90 Minutes Intravenous Every 24 hours 11/10/17 1623     11/09/17 1500  cefTRIAXone (ROCEPHIN) 1 g in sodium chloride 0.9 % 100 mL IVPB  Status:  Discontinued     1 g 200 mL/hr over 30 Minutes Intravenous Every 24 hours 11/09/17 1441 11/10/17 1613   11/09/17 1500  azithromycin (ZITHROMAX) 500 mg in sodium chloride 0.9 % 250 mL IVPB  Status:  Discontinued     500 mg 250 mL/hr over 60 Minutes Intravenous Every 24 hours 11/09/17 1441 11/10/17 1613   11/08/17 1800  amoxicillin-clavulanate (AUGMENTIN) 875-125 MG per tablet 1 tablet  Status:  Discontinued     1 tablet Oral Every 12 hours 11/08/17 1739 11/09/17 1441     Subjective: Seen and examined and this AM and still felt Dyspneic on exertion but maintained O2 Saturations. Felt slightly lightheaded when standing today and was still orthostatic. No CP.  Objective: Vitals:  11/12/17 1300 11/12/17 1410 11/12/17 1932 11/12/17 1933  BP:      Pulse:      Resp: 16     Temp: 97.7 F (36.5 C)     TempSrc: Oral     SpO2: 96% 96% 97% 97%  Weight:      Height:        Intake/Output Summary (Last 24 hours) at 11/12/2017 1953 Last data filed at 11/12/2017 1820 Gross per 24 hour  Intake 1415 ml  Output 1450 ml  Net -35 ml   Filed Weights   11/10/17 0550 11/11/17 0455 11/12/17 0500  Weight: 88.8 kg (195 lb 12.3 oz) 85.7 kg (188 lb 15 oz) 86.7 kg (191 lb 2.2 oz)   Examination: Physical Exam:  Constitutional: WN/WD Caucasian male in NAD Eyes: Sclerae anicteric. Lids normal ENMT: External ears and  nose appear normal. MMM Neck: Supple with no JVD Respiratory: Diminished to auscultation with some rhonchi. No appreciable crackles. Unlabored breathing and not wearing Supplemental O2 via [place] Cardiovascular: RRR; No m/r/g. No LE Edema Chest Wall: Port-A-Cath in Place Abdomen: Soft, NT, ND. Bowel sounds present GU: Deferred Musculoskeletal: No contractures; No cyanosis Skin: WN/WD; No appreciable rashes or lesions on a limtied skin eval Neurologic: CN 2-12 grossly intact. No appreciable  Psychiatric: Normal mood and affect. Intact judgement and insight  Data Reviewed: I have personally reviewed following labs and imaging studies  CBC: Recent Labs  Lab 11/08/17 0345 11/09/17 0444 11/10/17 0439 11/11/17 0458 11/12/17 0412  WBC 16.0* 15.8* 14.5* 13.8* 13.3*  NEUTROABS  --   --  12.5* 11.4* 11.0*  HGB 8.9* 9.2* 8.6* 9.6* 9.3*  HCT 25.9* 26.9* 25.2* 29.0* 28.0*  MCV 91.5 91.2 91.3 90.9 90.9  PLT 59* 65* 63* 77* 73*   Basic Metabolic Panel: Recent Labs  Lab 11/08/17 0345 11/09/17 0444 11/10/17 0439 11/11/17 0458 11/12/17 0412  NA 142 141 141 142 140  K 4.0 3.5 4.2 3.6 3.5  CL 111 108 109 106 106  CO2 21* 23 23 25 24   GLUCOSE 88 82 88 85 91  BUN 23* 21* 24* 23* 20  CREATININE 1.09 1.03 0.86 1.09 1.05  CALCIUM 7.7* 7.3* 7.8* 7.6* 6.9*  MG  --   --  2.1 2.0 2.0  PHOS  --   --  3.8 3.3 3.2   GFR: Estimated Creatinine Clearance: 98.9 mL/min (by C-G formula based on SCr of 1.05 mg/dL). Liver Function Tests: Recent Labs  Lab 11/10/17 0439 11/11/17 0458 11/12/17 0412  AST 82* 71* 67*  ALT 141* 142* 137*  ALKPHOS 602* 689* 624*  BILITOT 0.8 1.0 0.8  PROT 5.5* 6.3* 6.0*  ALBUMIN 2.7* 3.0* 3.0*   No results for input(s): LIPASE, AMYLASE in the last 168 hours. No results for input(s): AMMONIA in the last 168 hours. Coagulation Profile: No results for input(s): INR, PROTIME in the last 168 hours. Cardiac Enzymes: No results for input(s): CKTOTAL, CKMB, CKMBINDEX,  TROPONINI in the last 168 hours. BNP (last 3 results) No results for input(s): PROBNP in the last 8760 hours. HbA1C: No results for input(s): HGBA1C in the last 72 hours. CBG: No results for input(s): GLUCAP in the last 168 hours. Lipid Profile: No results for input(s): CHOL, HDL, LDLCALC, TRIG, CHOLHDL, LDLDIRECT in the last 72 hours. Thyroid Function Tests: No results for input(s): TSH, T4TOTAL, FREET4, T3FREE, THYROIDAB in the last 72 hours. Anemia Panel: No results for input(s): VITAMINB12, FOLATE, FERRITIN, TIBC, IRON, RETICCTPCT in the last 72 hours. Sepsis  Labs: No results for input(s): PROCALCITON, LATICACIDVEN in the last 168 hours.  Recent Results (from the past 240 hour(s))  MRSA PCR Screening     Status: None   Collection Time: 11/10/17  9:20 AM  Result Value Ref Range Status   MRSA by PCR NEGATIVE NEGATIVE Final    Comment:        The GeneXpert MRSA Assay (FDA approved for NASAL specimens only), is one component of a comprehensive MRSA colonization surveillance program. It is not intended to diagnose MRSA infection nor to guide or monitor treatment for MRSA infections. Performed at [facility], [place] [address] [place],  [address]      Radiology Studies: Dg Chest Port 1 View  Result Date: 11/12/2017 CLINICAL DATA:  SOB (shortness of breath)Hx HTN, lung ca and bone,liver and brain mets EXAM: PORTABLE CHEST 1 VIEW COMPARISON:  11/11/2017 and older exams. FINDINGS: There has been mild improvement in the hazy airspace lung opacities, most evident in the right upper lobe, when compared to the study dated 11/08/2017. No new lung abnormalities. The cardiac silhouette is normal in size. No mediastinal or hilar masses or evidence of adenopathy. Right-sided Port-A-Cath is stable. IMPRESSION: 1. Mild improvement in airspace lung opacities when compared to the study dated 11/08/2017. No new abnormalities. Electronically Signed   By: Lajean Manes M.D.    On: 11/12/2017 07:05   Dg Chest Port 1 View  Result Date: 11/11/2017 CLINICAL DATA:  Shortness of breath. Lung cancer. Metastatic disease. EXAM: PORTABLE CHEST 1 VIEW COMPARISON:  11/10/2017. FINDINGS: Right IJ line scratched it right IJ scratched it right scratched it PowerPort catheter stable position. Heart size stable. Persistent unchanged diffuse pulmonary interstitial prominence. No pleural effusion or pneumothorax. Blastic bony changes noted consistent with known blastic metastatic disease. IMPRESSION: 1.  PowerPort catheter stable position. 2. Diffuse bilateral pulmonary interstitial prominence unchanged from prior exam. Left lung base atelectasis/consolidation unchanged. 3. Blastic bony densities are again noted consistent with known blastic metastatic disease. Electronically Signed   By: Marcello Moores  Register   On: 11/11/2017 07:02   US Abdomen Limited Ruq  Result Date: 11/11/2017 CLINICAL DATA:  Abnormal LFT EXAM: ULTRASOUND ABDOMEN LIMITED RIGHT UPPER QUADRANT COMPARISON:  CT 10/21/2017 FINDINGS: Gallbladder: No gallstones or wall thickening visualized. No sonographic Murphy sign noted by sonographer. Common bile duct: Diameter: 4 mm Liver: Limited evaluation of the liver due to patient inability to suspend breathing. No definite focal hepatic abnormality is seen. Portal vein is patent on color Doppler imaging with normal direction of blood flow towards the liver. IMPRESSION: 1. Negative for gallstones or biliary dilatation 2. Slightly limited evaluation of the liver as above. No specific abnormality is seen Electronically Signed   By: Donavan Foil M.D.   On: 11/11/2017 18:22   Scheduled Meds: . docusate sodium  100 mg Oral Daily  . enoxaparin (LOVENOX) injection  1 mg/kg Subcutaneous BID  . folic acid  1 mg Oral Daily  . guaiFENesin  1,200 mg Oral BID  . ipratropium-albuterol  3 mL Nebulization TID  . mirtazapine  30 mg Oral QHS  . mometasone-formoterol  2 puff Inhalation BID  . pantoprazole   40 mg Oral Daily  . [START ON 11/13/2017] predniSONE  40 mg Oral Q breakfast   Followed by  . [START ON 11/17/2017] predniSONE  20 mg Oral Q breakfast   Followed by  . [START ON 11/21/2017] predniSONE  10 mg Oral Q breakfast   Followed by  . [START ON  11/25/2017] predniSONE  5 mg Oral Q breakfast  . protein supplement shake  11 oz Oral Q24H   Continuous Infusions: . levofloxacin (LEVAQUIN) IV Stopped (11/12/17 1916)    LOS: 11 days   Kerney Elbe, DO [facility] Pager [phone]  If 7PM-7AM, please contact night-coverage [email or web address] Password [facility] 11/12/2017, 7:53 PM

## 2017-11-12 NOTE — Progress Notes (Signed)
SATURATION QUALIFICATIONS: (This note is used to comply with regulatory documentation for home oxygen)  Patient Saturations on Room Air at Rest = 96%  Patient Saturations on Room Air while Ambulating = 90%  Patient Saturations on 0 Liters of oxygen while Ambulating = O Liters  Please briefly explain why patient needs home oxygen:Sob with exertion

## 2017-11-13 ENCOUNTER — Inpatient Hospital Stay (HOSPITAL_COMMUNITY): Payer: BLUE CROSS/BLUE SHIELD

## 2017-11-13 LAB — CBC WITH DIFFERENTIAL/PLATELET
BASOS PCT: 0 %
Basophils Absolute: 0 10*3/uL (ref 0.0–0.1)
EOS ABS: 0 10*3/uL (ref 0.0–0.7)
Eosinophils Relative: 0 %
HCT: 24.7 % — ABNORMAL LOW (ref 39.0–52.0)
Hemoglobin: 8.4 g/dL — ABNORMAL LOW (ref 13.0–17.0)
Lymphocytes Relative: 9 %
Lymphs Abs: 1 10*3/uL (ref 0.7–4.0)
MCH: 31.2 pg (ref 26.0–34.0)
MCHC: 34 g/dL (ref 30.0–36.0)
MCV: 91.8 fL (ref 78.0–100.0)
Monocytes Absolute: 0.8 10*3/uL (ref 0.1–1.0)
Monocytes Relative: 7 %
Neutro Abs: 9.6 10*3/uL (ref 1.7–7.7)
Neutrophils Relative %: 84 %
Platelets: 66 10*3/uL — ABNORMAL LOW (ref 150–400)
RBC: 2.69 MIL/uL — AB (ref 4.22–5.81)
RDW: 17.3 % — ABNORMAL HIGH (ref 11.5–15.5)
WBC: 11.5 10*3/uL — AB (ref 4.0–10.5)

## 2017-11-13 LAB — COMPREHENSIVE METABOLIC PANEL
ALBUMIN: 2.7 g/dL — AB (ref 3.5–5.0)
ALK PHOS: 569 U/L — AB (ref 38–126)
ALT: 121 U/L — ABNORMAL HIGH (ref 17–63)
ANION GAP: 8 (ref 5–15)
AST: 69 U/L — ABNORMAL HIGH (ref 15–41)
BUN: 15 mg/dL (ref 6–20)
CALCIUM: 6.8 mg/dL — AB (ref 8.9–10.3)
CHLORIDE: 107 mmol/L (ref 101–111)
CO2: 23 mmol/L (ref 22–32)
Creatinine, Ser: 0.94 mg/dL (ref 0.61–1.24)
GFR calc Af Amer: >60 mL/min (ref >60)
GFR calc non Af Amer: >60 mL/min (ref >60)
GLUCOSE: 89 mg/dL (ref 65–99)
Potassium: 3.4 mmol/L — ABNORMAL LOW (ref 3.5–5.1)
SODIUM: 138 mmol/L (ref 135–145)
Total Bilirubin: 0.8 mg/dL (ref 0.3–1.2)
Total Protein: 5.5 g/dL — ABNORMAL LOW (ref 6.5–8.1)

## 2017-11-13 LAB — PHOSPHORUS: Phosphorus: 3.1 mg/dL (ref 2.5–4.6)

## 2017-11-13 LAB — MAGNESIUM: Magnesium: 2 mg/dL (ref 1.7–2.4)

## 2017-11-13 MED ORDER — ENOXAPARIN SODIUM 100 MG/ML ~~LOC~~ SOLN: 1.5000 mg/kg | SUBCUTANEOUS | 0 refills | Status: DC

## 2017-11-13 MED ORDER — SODIUM CHLORIDE 0.9 % IV SOLN
1.0000 g | Freq: Once | INTRAVENOUS | Status: AC
  Administered 2017-11-13: 1 g via INTRAVENOUS
  Filled 2017-11-13: qty 1.1

## 2017-11-13 MED ORDER — HEPARIN SOD (PORK) LOCK FLUSH 100 UNIT/ML IV SOLN
500.0000 [IU] | INTRAVENOUS | Status: AC | PRN
  Administered 2017-11-13: 500 [IU]

## 2017-11-13 MED ORDER — IPRATROPIUM-ALBUTEROL 0.5-2.5 (3) MG/3ML IN SOLN: 3.0000 mL | Freq: Three times a day (TID) | RESPIRATORY_TRACT | 0 refills | Status: AC

## 2017-11-13 MED ORDER — LEVOFLOXACIN 750 MG PO TABS
750.0000 mg | ORAL_TABLET | ORAL | Status: DC
  Administered 2017-11-13: 750 mg via ORAL
  Filled 2017-11-13: qty 1

## 2017-11-13 MED ORDER — POTASSIUM CHLORIDE CRYS ER 20 MEQ PO TBCR
40.0000 meq | EXTENDED_RELEASE_TABLET | Freq: Two times a day (BID) | ORAL | Status: DC
  Administered 2017-11-13: 40 meq via ORAL
  Filled 2017-11-13: qty 2

## 2017-11-13 MED ORDER — GUAIFENESIN ER 600 MG PO TB12: 1200.0000 mg | ORAL_TABLET | Freq: Two times a day (BID) | ORAL | 0 refills | Status: DC

## 2017-11-13 MED ORDER — PREDNISONE 5 MG PO TABS: ORAL_TABLET | ORAL | 0 refills | Status: AC

## 2017-11-13 MED ORDER — PREMIER PROTEIN SHAKE: 11.0000 [oz_av] | ORAL | 0 refills | Status: AC

## 2017-11-13 MED ORDER — LEVOFLOXACIN 750 MG PO TABS: 750.0000 mg | ORAL_TABLET | ORAL | 0 refills | Status: DC

## 2017-11-13 NOTE — Care Management Note (Addendum)
Case Management Note  Patient Details  Name: Oscar Floyd MRN: [id number] Date of Birth: 11/06/1965  Subjective/Objective:   Acute PE, lung cancer                 Action/Plan: Spoke to pt and offered choice for HH/list provided. Pt requested [facility] for HH. Contacted [facility] with new referral. Pt has RW in the room. Will [facility]. Has nebulizer machine at home.   NCM contacted pt's pharmacy and they do not have Lovenox in stock. [facility] [address] [facility] but will need 150 mg/ml syringe Rx. Cost is $15 for Lovenox. Notified attending and will update [facility]. NCM spoke to pt and wife and explained they [facility] all [facility] [address] and [address] [place] [facility] and all Rx will be filled at that location. Will need to Prednisone RX to take to pharmacy.     Expected Discharge Date:  11/13/2017               Expected Discharge Plan:  [place]  In-House Referral:  NA  Discharge planning Services  CM Consult  Post Acute Care Choice:  Home Health Choice offered to:  Patient  DME Arranged:  [place] rolling DME Agency:  [place]:  PT [facility] Agency:  [place]  Status of Service:  Completed, signed off  If discussed at [place] of Stay Meetings, dates discussed:    Additional Comments:  Erenest Rasher, RN 11/13/2017, 12:15 PM

## 2017-11-13 NOTE — Discharge Summary (Signed)
Physician Discharge Summary  Oscar Floyd VOJ:[id number] DOB: Jun 26, 1966 DOA: 11/01/2017  PCP: [name], MD  Admit date: 11/01/2017 Discharge date: 11/13/2017  Admitted From: Home Disposition: Home with [place] [facility]  Recommendations for Outpatient Follow-up:  1. Follow up with PCP Dr. Coletta Memos in 1-2 weeks 2. Follow up with Oncology Dr. Julien Nordmann in 1 week 3. Follow up with Dr. Elsworth Soho in Pulmonary as an outpatient  4. Please obtain CMP/CBC, Mag, Phos in one week 5. 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 Evaluation revealed acute pulmonary embolism and now likely has a CAP. Due to his severe thrombocytopenia anticoagulation was not initiated initially butafter discussions with patient's oncologist as well as Pulmonology it was started on 2/25. Continues to remain SOB and requiring O2. IV Abx changed to Levofloxacin.   Discharge Diagnoses:  Active Problems:   Pulmonary embolism (HCC)  Acute Pulmonary Embolism with Hypoxia -Patient with history of malignancy and likely has hypercoagulable state as a result.  -Presented with shortness of breath and CT angiogram suggested acute pulmonary embolism at the interlobar pulmonary artery and there could be distal pulmonary emboli obscured by degree of motion artifcat.  -However due to severe thrombocytopenia risk of bleeding was thought to be quite high. Oncology recommended holding off on anticoagulation until his platelet counts become greater than 50,000.  -Patient remained hemodynamically stable.  Occasional symptoms of chest discomfort and shortness of breath but not requiring any Home O2 any more -Platelet counts had improved and the patient was started on Lovenox in 2/25 at 1 mg/kg BID and will go home on 1.5 mg/kg at D/C (spoke to Pharmacy and given 150 mg syringes) -Platelet counts 66,000 today.  -CXR today showed Stable ventilation since 11/08/2017. Continued diffuse bilateral reticulonodular pulmonary opacity, and chronic confluent left lung base opacity, which might all relate to metastatic lung cancer. No new cardiopulmonary abnormality. -PESI Score at Least a Class 3 -Check Bilateral LE Dopplers for clot burden; Negative for DVT -Checked ECHOCardiogram for RV Strain and did not show any evidence  -Continue supplemental oxygen.*Patient was able to be weaned off of Supplemental O2 and ambulated today without desaturating and did not require O2 at D/C -PT and OT evaluation recommending Home Health PT/OT.  -C/wAs needed morphine for pain control while hospitalized  CAP in the setting of Lung Cancer  -poA -CT Scan on 2/19 showed Acute airspace opacity asymmetric to the right. This is primarilyconcerning for infection in this patient with leukocytosis. -CXR as above -Patient coughing up Yellowish sputum  -C/w IV Levofloxacin and change to po at D/C  -C/w Steroid Taper per Oncology as ordered  -C/w Albuterol 2.5 m TID scheduled and q6hprn; Will aslo add Ipratropium TID for DuoNebs TID  -C/w Dulera 2 puff IH BID daily  -Add Flutter Valve, Incentive Spirometry, and Guaifenesin 1200 mg po BID  -IV Lasix stopped given Orthostasis  Orthostatic Hypotension -Likely from Overdiuresis -Improved; s/p Fluid Boluses  -IV Diuretics Stopped -TED Hose Applied -ECHO showed EF of 60-65% with G1DD -Not Orthostatic prior to D/C  Elevated Troponin -Most likely due to Pulmonary embolism. -POC was 0.27  and Troponin I was 0.89  -He is chest pain-free currently.  -EKG did not show any  acute changes. -Checked ECHOCardiogram and showed EF of 60-65% with normal wall motion and no regional Wall Motion Abnormalities   Normocytic Anemia -No evidence of overt bleeding. Hemoglobin has improved posttransfusion. -Hb/Hct now 8.4/24.7 -Continue to Monitor for S/Sx of Bleeding as patient is on full dose Anticoagulation with Lovenox -Repeat CBC as an outpatient   Leukocytosis -Leukocytosis most likely to PEGfilgrastim given on 2/9.  -WBC continues to improve but has essentially stabilized -WBC went from 32.1 -> 15.8 -> 14.5 -> 13.8 -> 13.3 -> 11.5 -Suspect some degree of Leukocytosis from PNA vs. Steroid Demargination  -Repeat CXR as an outpatient -Repeat CBC as an outpatient    Thrombocytopenia -Most likely due to chemotherapy.  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  -Status post brain radiation in 2015.  -Plan is for [facility] to [facility] for second opinion.  -Noted to be on steroids which will be continued per Oncology; Seen orders per Oncology  -Pain Control with Morphine 2 mg IV q3hprn, and Norco/Vicodin 1-2 tab po q4hprn Moderate Pain  -C/w Bowel Regimen  -Follow up with Dr. Earlie Server as an outpatient   Abnormal LFT's, stable -? Likely in the setting of Metastasis vs. Infection vs. Medication induced  -AST was 69 and ALT was 121 today; On admission AST was 42 and ALT was 44 -RUQ U/S done and was negative for gallstones or biliary dilatation. Slightly limited evaluation of the liver as above. No specific abnormality is seen -Acute Hepatitis Panel Negative  -Repeat CMP as an outpatient to evaluate  Discharge Instructions  Discharge Instructions    Call MD for:  difficulty breathing,  headache or visual disturbances   Complete by:  As directed    Call MD for:  extreme fatigue   Complete by:  As directed    Call MD for:  hives   Complete by:  As directed    Call MD for:  persistant dizziness or light-headedness   Complete by:  As directed    Call MD for:  persistant nausea and vomiting   Complete by:  As directed    Call MD for:  redness, tenderness, or signs of infection (pain, swelling, redness, odor or green/yellow discharge around incision site)   Complete by:  As directed    Call MD for:  severe uncontrolled pain   Complete by:  As directed    Call MD for:  temperature >100.4   Complete by:  As directed    Diet - low sodium heart healthy   Complete by:  As directed    Discharge instructions   Complete by:  As directed    Follow up with PCP, Pulmonology, and Oncology within 1 week of Discharge. Take all medications as prescribed. If symptoms change or worsen please return to the ED for evaluation.   Increase activity slowly   Complete by:  As directed      Allergies as of 11/13/2017   No Known Allergies     Medication List    STOP taking these medications   ibuprofen 800 MG tablet Commonly known as:  ADVIL,MOTRIN     TAKE these medications   albuterol  108 (90 Base) MCG/ACT inhaler Commonly known as:  PROVENTIL HFA;VENTOLIN HFA Inhale 2 puffs into the lungs every 6 (six) hours as needed for wheezing or shortness of breath.   albuterol (2.5 MG/3ML) 0.083% nebulizer solution Commonly known as:  PROVENTIL Take 3 mLs (2.5 mg total) by nebulization every 6 (six) hours as needed for wheezing or shortness of breath. Dx: J45.31   ALPRAZolam 1 MG tablet Commonly known as:  XANAX Take 0.5-1 mg by mouth.   budesonide-formoterol 80-4.5 MCG/ACT inhaler Commonly known as:  SYMBICORT Inhale 2 puffs into the lungs 2 (two) times daily.   diazepam 10 MG tablet Commonly known as:  VALIUM Take 10 mg by mouth. When having a brain MRI   docusate sodium 100 MG  capsule Commonly known as:  COLACE Take 100 mg by mouth daily.   enoxaparin 100 MG/ML injection Commonly known as:  LOVENOX Inject 1.35 mLs (135 mg total) into the skin daily.   esomeprazole 40 MG capsule Commonly known as:  NEXIUM Take 1 capsule (40 mg total) by mouth daily at 12 noon.   folic acid 1 MG tablet Commonly known as:  FOLVITE Take 1 tablet (1 mg total) by mouth daily.   guaiFENesin 600 MG 12 hr tablet Commonly known as:  MUCINEX Take 2 tablets (1,200 mg total) by mouth 2 (two) times daily.   HYDROcodone-acetaminophen 5-325 MG tablet Commonly known as:  NORCO/VICODIN Take 1 tablet by mouth every 6 (six) hours as needed for moderate pain.   ipratropium-albuterol 0.5-2.5 (3) MG/3ML Soln Commonly known as:  DUONEB Take 3 mLs by nebulization 3 (three) times daily.   levofloxacin 750 MG tablet Commonly known as:  LEVAQUIN Take 1 tablet (750 mg total) by mouth daily.   MELATONIN PO Take 20 mg by mouth.   naproxen sodium 220 MG tablet Commonly known as:  ALEVE Take 220 mg 2 (two) times daily as needed by mouth.   predniSONE 5 MG tablet Commonly known as:  DELTASONE Take 8 tablets (40 mg total) by mouth daily with breakfast for 3 days, THEN 4 tablets (20 mg total) daily with breakfast for 4 days, THEN 2 tablets (10 mg total) daily with breakfast for 4 days, THEN 1 tablet (5 mg total) daily with breakfast for 4 days. Start taking on:  11/13/2017 What changed:    medication strength  See the new instructions.   protein supplement shake Liqd Commonly known as:  PREMIER PROTEIN Take 325 mLs (11 oz total) by mouth daily.            Durable Medical Equipment  (From admission, onward)        Start     Ordered   11/13/17 1446  DME 3-in-1  Once     11/13/17 1447   11/13/17 1446  For home use only DME Walker rolling  [facility])  Once    Question:  Patient needs a walker to treat with the following condition  Answer:  Weakness   11/13/17 1447   11/10/17 1130   For home use only DME Walker rolling  Once    Question:  Patient needs a walker to treat with the following condition  Answer:  Unsteady gait   11/10/17 [place] Follow up.   Why:  rolling walker Contact information: [address] [place] [address] [phone]        [facility], [facility] Follow up.  Specialty:  [place] Why:  Home Health physical therapy Contact information: [address] [place] [address] [phone]        [name], MD. Call.   Specialty:  Family Medicine Why:  Follow up within 1 week for Hospital Follow up Contact information: [place] [address] [phone]        Rigoberto Noel, MD. Call.   Specialty:  Pulmonary Disease Why:  Follow up within 1 week for Hospital Follow Up Contact information: [address] [place] [address] [phone]        [name], MD. Call.   Specialty:  Oncology Why:  Follow up within 1 week for hospital follow up Contact information: [place] [address] [phone]          No Known Allergies  Consultations:  Oncology Dr. Julien Nordmann  Procedures/Studies: Dg Chest 2 View  Result Date: 11/01/2017 CLINICAL DATA:  Shortness of breath.  History of lung cancer EXAM: CHEST  2 VIEW COMPARISON:  10/21/2017 FINDINGS: There is generalized interstitial and airspace opacity. Much of this opacity is from alveolar and lymphangitic tumor based on prior CT. There is newly increased density especially on the right which could reflect superimposed infection. No visible effusion or Kerley lines. Normal heart size and mediastinal contours. Generalized bony metastases. IMPRESSION: Interstitial and alveolar tumor bilaterally based on recent staging chest CT. There is superimposed acute opacity, especially on the right, which may  reflect pneumonia or asymmetric edema. Electronically Signed   By: Monte Fantasia M.D.   On: 11/01/2017 11:31   Ct Chest W Contrast  Result Date: 10/21/2017 CLINICAL DATA:  52 year old male with history of lung cancer with metastatic disease to the liver, brain and bones diagnosed in July 2015. Follow-up study. EXAM: CT CHEST, ABDOMEN, AND PELVIS WITH CONTRAST TECHNIQUE: Multidetector CT imaging of the chest, abdomen and pelvis was performed following the standard protocol during bolus administration of intravenous contrast. CONTRAST:  51mL ISOVUE-300 IOPAMIDOL (ISOVUE-300) INJECTION 61%, 113mL ISOVUE-300 IOPAMIDOL (ISOVUE-300) INJECTION 61% COMPARISON:  CT of the chest, abdomen and pelvis 09/21/2017. FINDINGS: CT CHEST FINDINGS Cardiovascular: Heart size is normal. Small amount of pericardial fluid and/or thickening, similar to the prior study and unlikely to be of hemodynamic significance at this time. No associated pericardial calcification. No definite atherosclerotic calcifications in the thoracic aorta or the coronary arteries. Right internal jugular single-lumen porta cath with tip terminating at the superior cavoatrial junction. Mediastinum/Nodes: No pathologically enlarged mediastinal or hilar lymph nodes. Esophagus is unremarkable in appearance. No axillary lymphadenopathy. Lungs/Pleura: There continues to be a large mass-like consolidative region in the left lower lobe which is similar to the prior study currently measuring 5.4 x 9.9 cm (axial image 91 of series 4). Diffuse nodular septal thickening and scattered areas of micro and macronodularity are again noted throughout the lungs bilaterally, similar to the prior study. The largest of these independent nodules is in the right upper lobe (axial image 69 of series 4), currently measuring 18 x 13 mm (unchanged). Trace volume of left pleural fluid adjacent to the mass-like opacity, similar to the prior study. No right pleural effusion.  Musculoskeletal: Diffuse predominantly sclerotic lesions are again noted throughout all aspects of the visualized axial and appendicular skeleton, compatible with widespread metastatic disease to the bones. The appearance is very similar to the prior study. CT ABDOMEN PELVIS FINDINGS Hepatobiliary: No suspicious cystic or solid hepatic lesions. No  intra or extrahepatic biliary ductal dilatation. Gallbladder is normal in appearance. Pancreas: No pancreatic mass. No pancreatic ductal dilatation. No pancreatic or peripancreatic fluid or inflammatory changes. Spleen: Unremarkable. Adrenals/Urinary Tract: Subcentimeter low-attenuation lesion in the interpolar region of the right kidney is too small to definitively characterize, but is statistically likely to represent a tiny cyst. Left kidney and bilateral adrenal glands are normal in appearance. No hydroureteronephrosis or perinephric stranding to suggest urinary tract obstruction at this time. Stomach/Bowel: The appearance of the stomach is normal. There is no pathologic dilatation of small bowel or colon. Normal appendix. Vascular/Lymphatic: No significant atherosclerotic disease, aneurysm or dissection noted in the abdominal or pelvic vasculature. No lymphadenopathy noted in the abdomen or pelvis. Reproductive: Prostate gland and seminal vesicles are unremarkable in appearance. Other: No significant volume of ascites.  No pneumoperitoneum. Musculoskeletal: There are no aggressive appearing lytic or blastic lesions noted in the visualized portions of the skeleton. IMPRESSION: 1. Today's study demonstrates a generally stable appearance of widespread metastatic disease in the lungs bilaterally and throughout the skeleton. No definite progression of disease identified on today's examination. 2. No hepatic metastases noted. 3. Additional incidental findings, as above. Electronically Signed   By: Vinnie Langton M.D.   On: 10/21/2017 15:22   Ct Angio Chest Pe W And/or  Wo Contrast  Result Date: 11/01/2017 CLINICAL DATA:  PE, high pretest probability. Lung cancer with shortness of breath. EXAM: CT ANGIOGRAPHY CHEST WITH CONTRAST TECHNIQUE: Multidetector CT imaging of the chest was performed using the standard protocol during bolus administration of intravenous contrast. Multiplanar CT image reconstructions and MIPs were obtained to evaluate the vascular anatomy. CONTRAST:  70mL ISOVUE-370 IOPAMIDOL (ISOVUE-370) INJECTION 76% COMPARISON:  Chest CT 10/21/2017 FINDINGS: Cardiovascular: Normal heart size. Small pericardial effusion without nodularity or increased density. Porta catheter on the right with tip at the right atrium. There is satisfactory opacification of the pulmonary arterial tree but still significant limitation by patient motion. There is a interlobar pulmonary artery clot that is nonobstructive. More peripheral clot could be present but not seen due to the degree of motion. Given the small volume of clot there is no suspected right heart strain. Accuracy of RV to LV measurement is diminished by degree of cardiac motion. Mediastinum/Nodes: Negative for adenopathy Lungs/Pleura: There is generalized interstitial nodularity with masslike consolidation in the left lower lobe and patchy nodular densities in the right lung. There is superimposed asymmetric airspace disease in the right upper lung. Interlobular septal thickening seen bilaterally at the apex. Trace pleural fluid on the left, also seen previously. Upper Abdomen: No acute finding Musculoskeletal: Diffuse osteoblastic metastatic disease. No acute finding. Critical Value/emergent results were called by telephone at the time of interpretation on 11/01/2017 at 12:16 pm to Dr. Davonna Belling , who verbally acknowledged these results. Review of the MIP images confirms the above findings. IMPRESSION: 1. Acute pulmonary embolism at the interlobar pulmonary artery. There could be distal pulmonary emboli obscured by  degree of motion artifact. 2. Acute airspace opacity asymmetric to the right. This is primarily concerning for infection in this patient with leukocytosis. Asymmetric edema or drug reaction are additional considerations. 3. Known metastatic lung cancer most recently staged 10/21/17. Electronically Signed   By: Monte Fantasia M.D.   On: 11/01/2017 12:21   Ct Abdomen Pelvis W Contrast  Result Date: 10/21/2017 CLINICAL DATA:  52 year old male with history of lung cancer with metastatic disease to the liver, brain and bones diagnosed in July 2015. Follow-up study. EXAM: CT CHEST,  ABDOMEN, AND PELVIS WITH CONTRAST TECHNIQUE: Multidetector CT imaging of the chest, abdomen and pelvis was performed following the standard protocol during bolus administration of intravenous contrast. CONTRAST:  68mL ISOVUE-300 IOPAMIDOL (ISOVUE-300) INJECTION 61%, 142mL ISOVUE-300 IOPAMIDOL (ISOVUE-300) INJECTION 61% COMPARISON:  CT of the chest, abdomen and pelvis 09/21/2017. FINDINGS: CT CHEST FINDINGS Cardiovascular: Heart size is normal. Small amount of pericardial fluid and/or thickening, similar to the prior study and unlikely to be of hemodynamic significance at this time. No associated pericardial calcification. No definite atherosclerotic calcifications in the thoracic aorta or the coronary arteries. Right internal jugular single-lumen porta cath with tip terminating at the superior cavoatrial junction. Mediastinum/Nodes: No pathologically enlarged mediastinal or hilar lymph nodes. Esophagus is unremarkable in appearance. No axillary lymphadenopathy. Lungs/Pleura: There continues to be a large mass-like consolidative region in the left lower lobe which is similar to the prior study currently measuring 5.4 x 9.9 cm (axial image 91 of series 4). Diffuse nodular septal thickening and scattered areas of micro and macronodularity are again noted throughout the lungs bilaterally, similar to the prior study. The largest of these  independent nodules is in the right upper lobe (axial image 69 of series 4), currently measuring 18 x 13 mm (unchanged). Trace volume of left pleural fluid adjacent to the mass-like opacity, similar to the prior study. No right pleural effusion. Musculoskeletal: Diffuse predominantly sclerotic lesions are again noted throughout all aspects of the visualized axial and appendicular skeleton, compatible with widespread metastatic disease to the bones. The appearance is very similar to the prior study. CT ABDOMEN PELVIS FINDINGS Hepatobiliary: No suspicious cystic or solid hepatic lesions. No intra or extrahepatic biliary ductal dilatation. Gallbladder is normal in appearance. Pancreas: No pancreatic mass. No pancreatic ductal dilatation. No pancreatic or peripancreatic fluid or inflammatory changes. Spleen: Unremarkable. Adrenals/Urinary Tract: Subcentimeter low-attenuation lesion in the interpolar region of the right kidney is too small to definitively characterize, but is statistically likely to represent a tiny cyst. Left kidney and bilateral adrenal glands are normal in appearance. No hydroureteronephrosis or perinephric stranding to suggest urinary tract obstruction at this time. Stomach/Bowel: The appearance of the stomach is normal. There is no pathologic dilatation of small bowel or colon. Normal appendix. Vascular/Lymphatic: No significant atherosclerotic disease, aneurysm or dissection noted in the abdominal or pelvic vasculature. No lymphadenopathy noted in the abdomen or pelvis. Reproductive: Prostate gland and seminal vesicles are unremarkable in appearance. Other: No significant volume of ascites.  No pneumoperitoneum. Musculoskeletal: There are no aggressive appearing lytic or blastic lesions noted in the visualized portions of the skeleton. IMPRESSION: 1. Today's study demonstrates a generally stable appearance of widespread metastatic disease in the lungs bilaterally and throughout the skeleton. No  definite progression of disease identified on today's examination. 2. No hepatic metastases noted. 3. Additional incidental findings, as above. Electronically Signed   By: Vinnie Langton M.D.   On: 10/21/2017 15:22   Ir US Guide Vasc Access Right  Result Date: 10/19/2017 INDICATION: 52 year old male with recurrent lung cancer in need of durable venous access for chemotherapy. EXAM: IMPLANTED PORT A CATH PLACEMENT WITH ULTRASOUND AND FLUOROSCOPIC GUIDANCE MEDICATIONS: 2 g Ancef IV; The antibiotic was administered within an appropriate time interval prior to skin puncture. ANESTHESIA/SEDATION: Versed 4 mg IV; Fentanyl 100 mcg IV; Moderate Sedation Time:  19 minutes The patient was continuously monitored during the procedure by the interventional radiology nurse under my direct supervision. FLUOROSCOPY TIME:  0 minutes, 18 seconds (4 mGy) COMPLICATIONS: None immediate. PROCEDURE: The right neck  and chest was prepped with chlorhexidine, and draped in the usual sterile fashion using maximum barrier technique (cap and mask, sterile gown, sterile gloves, large sterile sheet, hand hygiene and cutaneous antiseptic). Antibiotic prophylaxis was provided with 2g Ancef administered IV one hour prior to skin incision. Local anesthesia was attained by infiltration with 1% lidocaine with epinephrine. Ultrasound demonstrated patency of the right internal jugular vein, and this was documented with an image. Under real-time ultrasound guidance, this vein was accessed with a 21 gauge micropuncture needle and image documentation was performed. A small dermatotomy was made at the access site with an 11 scalpel. A 0.018" wire was advanced into the SVC and the access needle exchanged for a 49F micropuncture vascular sheath. The 0.018" wire was then removed and a 0.035" wire advanced into the IVC. An appropriate location for the subcutaneous reservoir was selected below the clavicle and an incision was made through the skin and  underlying soft tissues. The subcutaneous tissues were then dissected using a combination of blunt and sharp surgical technique and a pocket was formed. A single lumen power injectable portacatheter was then tunneled through the subcutaneous tissues from the pocket to the dermatotomy and the port reservoir placed within the subcutaneous pocket. The venous access site was then serially dilated and a peel away vascular sheath placed over the wire. The wire was removed and the port catheter advanced into position under fluoroscopic guidance. The catheter tip is positioned in the upper right atrium. This was documented with a spot image. The portacatheter was then tested and found to flush and aspirate well. The port was flushed with saline followed by 100 units/mL heparinized saline. The pocket was then closed in two layers using first subdermal inverted interrupted absorbable sutures followed by a running subcuticular suture. The epidermis was then sealed with Dermabond. The dermatotomy at the venous access site was also closed with a single inverted subdermal suture and the epidermis sealed with Dermabond. IMPRESSION: Successful placement of a right IJ approach Power Port with ultrasound and fluoroscopic guidance. The catheter is ready for use. Electronically Signed   By: Jacqulynn Cadet M.D.   On: 10/19/2017 14:57   Dg Chest Port 1 View  Result Date: 11/13/2017 CLINICAL DATA:  52 year old male with lung cancer, shortness of breath, diagnosed with pulmonary embolus 2 weeks ago. Probable pneumonia now. EXAM: PORTABLE CHEST 1 VIEW COMPARISON:  11/12/2017 and earlier. FINDINGS: Portable AP semi upright view at 0434 hours. Stable right chest porta cath, accessed. Stable cardiac size and mediastinal contours. Visualized tracheal air column is within normal limits. Continued bilateral reticulonodular pulmonary opacity which is chronic but increased since 2018. Stable more confluent opacity at the left lung base,  thought related to lung cancer by PET-CT in November 2018. no new confluent opacity. No pneumothorax or pleural effusion. IMPRESSION: 1. Stable ventilation since 11/08/2017. Continued diffuse bilateral reticulonodular pulmonary opacity, and chronic confluent left lung base opacity, which might all relate to metastatic lung cancer. 2. No new cardiopulmonary abnormality. Electronically Signed   By: Genevie Ann M.D.   On: 11/13/2017 06:44   Dg Chest Port 1 View  Result Date: 11/12/2017 CLINICAL DATA:  SOB (shortness of breath)Hx HTN, lung ca and bone,liver and brain mets EXAM: PORTABLE CHEST 1 VIEW COMPARISON:  11/11/2017 and older exams. FINDINGS: There has been mild improvement in the hazy airspace lung opacities, most evident in the right upper lobe, when compared to the study dated 11/08/2017. No new lung abnormalities. The cardiac silhouette is normal  in size. No mediastinal or hilar masses or evidence of adenopathy. Right-sided Port-A-Cath is stable. IMPRESSION: 1. Mild improvement in airspace lung opacities when compared to the study dated 11/08/2017. No new abnormalities. Electronically Signed   By: Lajean Manes M.D.   On: 11/12/2017 07:05   Dg Chest Port 1 View  Result Date: 11/11/2017 CLINICAL DATA:  Shortness of breath. Lung cancer. Metastatic disease. EXAM: PORTABLE CHEST 1 VIEW COMPARISON:  11/10/2017. FINDINGS: Right IJ line scratched it right IJ scratched it right scratched it PowerPort catheter stable position. Heart size stable. Persistent unchanged diffuse pulmonary interstitial prominence. No pleural effusion or pneumothorax. Blastic bony changes noted consistent with known blastic metastatic disease. IMPRESSION: 1.  PowerPort catheter stable position. 2. Diffuse bilateral pulmonary interstitial prominence unchanged from prior exam. Left lung base atelectasis/consolidation unchanged. 3. Blastic bony densities are again noted consistent with known blastic metastatic disease. Electronically Signed    By: Marcello Moores  Register   On: 11/11/2017 07:02   Dg Chest Port 1 View  Result Date: 11/10/2017 CLINICAL DATA:  Shortness of breath. EXAM: PORTABLE CHEST 1 VIEW COMPARISON:  11/08/2017, 11/01/2017.  CT 11/01/2017. FINDINGS: PowerPort catheter with lead tip over the cavoatrial junction. Heart size stable. Persistent but improved diffuse bilateral pulmonary infiltrates/edema. No pleural effusion or pneumothorax. No acute bony abnormality. Reference is made to prior CT report 11/01/2017. Blastic changes noted the bony structures consistent with known blastic metastatic disease in this patient with known lung cancer. No acute bony abnormality. IMPRESSION: 1.  PowerPort catheter stable position. 2. Persistent but improved diffuse bilateral pulmonary infiltrates/edema. 2. Blastic changes are noted in the bony structures consistent with known blastic metastatic disease in this patient with known metastatic lung cancer. Electronically Signed   By: Marcello Moores  Register   On: 11/10/2017 07:26   Dg Chest Port 1 View  Result Date: 11/08/2017 CLINICAL DATA:  Shortness of breath, follow-up EXAM: PORTABLE CHEST 1 VIEW COMPARISON:  CT chest of 11/01/2017 and chest x-ray of the same day FINDINGS: There is little change to perhaps minimal improvement in diffuse airspace disease possibly representing pneumonia in this patient with leukocytosis. No definite pleural effusion is seen. Mediastinal and hilar contours are unremarkable. The heart is within normal limits in size. Right-sided Port-A-Cath tip extends to overlie the lower SVC. No bony abnormality is seen. IMPRESSION: Little change to slight improvement in diffuse airspace disease. Electronically Signed   By: Ivar Drape M.D.   On: 11/08/2017 15:50   Ir Fluoro Guide Port Insertion Right  Result Date: 10/19/2017 INDICATION: 52 year old male with recurrent lung cancer in need of durable venous access for chemotherapy. EXAM: IMPLANTED PORT A CATH PLACEMENT WITH ULTRASOUND AND  FLUOROSCOPIC GUIDANCE MEDICATIONS: 2 g Ancef IV; The antibiotic was administered within an appropriate time interval prior to skin puncture. ANESTHESIA/SEDATION: Versed 4 mg IV; Fentanyl 100 mcg IV; Moderate Sedation Time:  19 minutes The patient was continuously monitored during the procedure by the interventional radiology nurse under my direct supervision. FLUOROSCOPY TIME:  0 minutes, 18 seconds (4 mGy) COMPLICATIONS: None immediate. PROCEDURE: The right neck and chest was prepped with chlorhexidine, and draped in the usual sterile fashion using maximum barrier technique (cap and mask, sterile gown, sterile gloves, large sterile sheet, hand hygiene and cutaneous antiseptic). Antibiotic prophylaxis was provided with 2g Ancef administered IV one hour prior to skin incision. Local anesthesia was attained by infiltration with 1% lidocaine with epinephrine. Ultrasound demonstrated patency of the right internal jugular vein, and this was documented with an image.  Under real-time ultrasound guidance, this vein was accessed with a 21 gauge micropuncture needle and image documentation was performed. A small dermatotomy was made at the access site with an 11 scalpel. A 0.018" wire was advanced into the SVC and the access needle exchanged for a 50F micropuncture vascular sheath. The 0.018" wire was then removed and a 0.035" wire advanced into the IVC. An appropriate location for the subcutaneous reservoir was selected below the clavicle and an incision was made through the skin and underlying soft tissues. The subcutaneous tissues were then dissected using a combination of blunt and sharp surgical technique and a pocket was formed. A single lumen power injectable portacatheter was then tunneled through the subcutaneous tissues from the pocket to the dermatotomy and the port reservoir placed within the subcutaneous pocket. The venous access site was then serially dilated and a peel away vascular sheath placed over the wire.  The wire was removed and the port catheter advanced into position under fluoroscopic guidance. The catheter tip is positioned in the upper right atrium. This was documented with a spot image. The portacatheter was then tested and found to flush and aspirate well. The port was flushed with saline followed by 100 units/mL heparinized saline. The pocket was then closed in two layers using first subdermal inverted interrupted absorbable sutures followed by a running subcuticular suture. The epidermis was then sealed with Dermabond. The dermatotomy at the venous access site was also closed with a single inverted subdermal suture and the epidermis sealed with Dermabond. IMPRESSION: Successful placement of a right IJ approach Power Port with ultrasound and fluoroscopic guidance. The catheter is ready for use. Electronically Signed   By: Jacqulynn Cadet M.D.   On: 10/19/2017 14:57   US Abdomen Limited Ruq  Result Date: 11/11/2017 CLINICAL DATA:  Abnormal LFT EXAM: ULTRASOUND ABDOMEN LIMITED RIGHT UPPER QUADRANT COMPARISON:  CT 10/21/2017 FINDINGS: Gallbladder: No gallstones or wall thickening visualized. No sonographic Murphy sign noted by sonographer. Common bile duct: Diameter: 4 mm Liver: Limited evaluation of the liver due to patient inability to suspend breathing. No definite focal hepatic abnormality is seen. Portal vein is patent on color Doppler imaging with normal direction of blood flow towards the liver. IMPRESSION: 1. Negative for gallstones or biliary dilatation 2. Slightly limited evaluation of the liver as above. No specific abnormality is seen Electronically Signed   By: Donavan Foil M.D.   On: 11/11/2017 18:22    ECHOCARDIOGRAM ------------------------------------------------------------------- Study Conclusions  - Left ventricle: The cavity size was normal. Wall thickness was   normal. Systolic function was normal. The estimated ejection   fraction was in the range of 60% to 65%. Wall  motion was normal;   there were no regional wall motion abnormalities. Doppler   parameters are consistent with abnormal left ventricular   relaxation (grade 1 diastolic dysfunction). - Pericardium, extracardiac: A small, free-flowing pericardial   effusion was identified circumferential to the heart. There was   no evidence of hemodynamic compromise.  Subjective: Seen and examined and was improved. Had SOB but did not desaturate. Felt better than when he came in. No CP. Ready to go home.  Discharge Exam: Vitals:   11/13/17 0546 11/13/17 1146  BP: 108/78   Pulse: 78   Resp: 16   Temp: 98.6 F (37 C)   SpO2: 98% 94%   Vitals:   11/12/17 1933 11/12/17 2007 11/13/17 0546 11/13/17 1146  BP:  104/71 108/78   Pulse:   78   Resp:  20 16   Temp:  98.6 F (37 C) 98.6 F (37 C)   TempSrc:  Oral Oral   SpO2: 97% 96% 98% 94%  Weight:   86.7 kg (191 lb 2.2 oz)   Height:       General: Pt is alert, awake, not in acute distress Cardiovascular: RRR, S1/S2 +, no rubs, no gallops Respiratory: Diminished bilaterally with mild rhoncho, no wheezing, no rhonchi Abdominal: Soft, NT, ND, bowel sounds + Extremities: no edema, no cyanosis  The results of significant diagnostics from this hospitalization (including imaging, microbiology, ancillary and laboratory) are listed below for reference.    Microbiology: Recent Results (from the past 240 hour(s))  MRSA PCR Screening     Status: None   Collection Time: 11/10/17  9:20 AM  Result Value Ref Range Status   MRSA by PCR NEGATIVE NEGATIVE Final    Comment:        The GeneXpert MRSA Assay (FDA approved for NASAL specimens only), is one component of a comprehensive MRSA colonization surveillance program. It is not intended to diagnose MRSA infection nor to guide or monitor treatment for MRSA infections. Performed at [facility], [place] [address], [place], [place] [address]     Labs: BNP (last 3 results) No results  for input(s): BNP in the last 8760 hours. Basic Metabolic Panel: Recent Labs  Lab 11/09/17 0444 11/10/17 0439 11/11/17 0458 11/12/17 0412 11/13/17 0317  NA 141 141 142 140 138  K 3.5 4.2 3.6 3.5 3.4*  CL 108 109 106 106 107  CO2 23 23 25 24 23   GLUCOSE 82 88 85 91 89  BUN 21* 24* 23* 20 15  CREATININE 1.03 0.86 1.09 1.05 0.94  CALCIUM 7.3* 7.8* 7.6* 6.9* 6.8*  MG  --  2.1 2.0 2.0 2.0  PHOS  --  3.8 3.3 3.2 3.1   Liver Function Tests: Recent Labs  Lab 11/10/17 0439 11/11/17 0458 11/12/17 0412 11/13/17 0317  AST 82* 71* 67* 69*  ALT 141* 142* 137* 121*  ALKPHOS 602* 689* 624* 569*  BILITOT 0.8 1.0 0.8 0.8  PROT 5.5* 6.3* 6.0* 5.5*  ALBUMIN 2.7* 3.0* 3.0* 2.7*   No results for input(s): LIPASE, AMYLASE in the last 168 hours. No results for input(s): AMMONIA in the last 168 hours. CBC: Recent Labs  Lab 11/09/17 0444 11/10/17 0439 11/11/17 0458 11/12/17 0412 11/13/17 0317  WBC 15.8* 14.5* 13.8* 13.3* 11.5*  NEUTROABS  --  12.5* 11.4* 11.0* 9.6  HGB 9.2* 8.6* 9.6* 9.3* 8.4*  HCT 26.9* 25.2* 29.0* 28.0* 24.7*  MCV 91.2 91.3 90.9 90.9 91.8  PLT 65* 63* 77* 73* 66*   Cardiac Enzymes: No results for input(s): CKTOTAL, CKMB, CKMBINDEX, TROPONINI in the last 168 hours. BNP: Invalid input(s): POCBNP CBG: No results for input(s): GLUCAP in the last 168 hours. D-Dimer No results for input(s): DDIMER in the last 72 hours. Hgb A1c No results for input(s): HGBA1C in the last 72 hours. Lipid Profile No results for input(s): CHOL, HDL, LDLCALC, TRIG, CHOLHDL, LDLDIRECT in the last 72 hours. Thyroid function studies No results for input(s): TSH, T4TOTAL, T3FREE, THYROIDAB in the last 72 hours.  Invalid input(s): FREET3 Anemia work up No results for input(s): VITAMINB12, FOLATE, FERRITIN, TIBC, IRON, RETICCTPCT in the last 72 hours. Urinalysis No results found for: COLORURINE, APPEARANCEUR, [place], [place], GLUCOSEU, [place], BILIRUBINUR, KETONESUR, PROTEINUR,  UROBILINOGEN, NITRITE, LEUKOCYTESUR Sepsis Labs Invalid input(s): PROCALCITONIN,  WBC,  LACTICIDVEN Microbiology Recent Results (from the past 240 hour(s))  MRSA PCR Screening     Status: None   Collection Time: 11/10/17  9:20 AM  Result Value Ref Range Status   MRSA by PCR NEGATIVE NEGATIVE Final    Comment:        The GeneXpert MRSA Assay (FDA approved for NASAL specimens only), is one component of a comprehensive MRSA colonization surveillance program. It is not intended to diagnose MRSA infection nor to guide or monitor treatment for MRSA infections. Performed at [facility], [place] [address] [place], [place] [address]    Time coordinating discharge: 35 minutes  SIGNED:  Kerney Elbe, DO [facility] 11/13/2017, 2:47 PM Pager [phone]  If 7PM-7AM, please contact night-coverage [email or web address] Password [email or web address]

## 2017-11-13 NOTE — Progress Notes (Signed)
Discussed with patient and spouse discharge instructions, both verbalized agreement and understanding.  Patient to go in wheelchair with all belongings to go home in private vehicle.

## 2017-11-15 ENCOUNTER — Ambulatory Visit: Payer: Self-pay | Admitting: Urology

## 2017-11-16 ENCOUNTER — Telehealth: Payer: Self-pay | Admitting: Medical Oncology

## 2017-11-16 NOTE — Telephone Encounter (Signed)
Requests PT visits 2 x week x 3 weeks. I told her ok for visits.

## 2017-11-17 ENCOUNTER — Inpatient Hospital Stay: Payer: BLUE CROSS/BLUE SHIELD | Attending: Internal Medicine

## 2017-11-17 ENCOUNTER — Telehealth: Payer: Self-pay | Admitting: *Deleted

## 2017-11-17 DIAGNOSIS — C7951 Secondary malignant neoplasm of bone: Secondary | ICD-10-CM | POA: Insufficient documentation

## 2017-11-17 DIAGNOSIS — C7931 Secondary malignant neoplasm of brain: Secondary | ICD-10-CM | POA: Diagnosis not present

## 2017-11-17 DIAGNOSIS — D696 Thrombocytopenia, unspecified: Secondary | ICD-10-CM | POA: Insufficient documentation

## 2017-11-17 DIAGNOSIS — Z5111 Encounter for antineoplastic chemotherapy: Secondary | ICD-10-CM | POA: Diagnosis present

## 2017-11-17 DIAGNOSIS — C3432 Malignant neoplasm of lower lobe, left bronchus or lung: Secondary | ICD-10-CM | POA: Diagnosis not present

## 2017-11-17 DIAGNOSIS — I2699 Other pulmonary embolism without acute cor pulmonale: Secondary | ICD-10-CM | POA: Diagnosis not present

## 2017-11-17 DIAGNOSIS — C787 Secondary malignant neoplasm of liver and intrahepatic bile duct: Secondary | ICD-10-CM | POA: Insufficient documentation

## 2017-11-17 DIAGNOSIS — Z5112 Encounter for antineoplastic immunotherapy: Secondary | ICD-10-CM | POA: Diagnosis present

## 2017-11-17 DIAGNOSIS — C3491 Malignant neoplasm of unspecified part of right bronchus or lung: Secondary | ICD-10-CM

## 2017-11-17 LAB — CBC WITH DIFFERENTIAL (CANCER CENTER ONLY)
BASOS ABS: 0.1 10*3/uL (ref 0.0–0.1)
Basophils Relative: 1 %
EOS PCT: 0 %
Eosinophils Absolute: 0 10*3/uL (ref 0.0–0.5)
HCT: 30.5 % — ABNORMAL LOW (ref 38.4–49.9)
Hemoglobin: 10.1 g/dL — ABNORMAL LOW (ref 13.0–17.1)
Lymphocytes Relative: 6 %
Lymphs Abs: 0.9 10*3/uL (ref 0.9–3.3)
MCH: 30 pg (ref 27.2–33.4)
MCHC: 33 g/dL (ref 32.0–36.0)
MCV: 91 fL (ref 79.3–98.0)
MONO ABS: 0.5 10*3/uL (ref 0.1–0.9)
Monocytes Relative: 3 %
Neutro Abs: 13.3 10*3/uL — ABNORMAL HIGH (ref 1.5–6.5)
Neutrophils Relative %: 90 %
PLATELETS: 86 10*3/uL — AB (ref 140–400)
RBC: 3.35 MIL/uL — AB (ref 4.20–5.82)
RDW: 19.2 % — AB (ref 11.0–14.6)
WBC: 14.8 10*3/uL — AB (ref 4.0–10.3)

## 2017-11-17 LAB — CMP (CANCER CENTER ONLY)
ALBUMIN: 3.1 g/dL — AB (ref 3.5–5.0)
ALT: 112 U/L — AB (ref 0–55)
AST: 67 U/L — AB (ref 5–34)
Alkaline Phosphatase: 796 U/L — ABNORMAL HIGH (ref 40–150)
Anion gap: 13 — ABNORMAL HIGH (ref 3–11)
BUN: 18 mg/dL (ref 7–26)
CHLORIDE: 106 mmol/L (ref 98–109)
CO2: 20 mmol/L — AB (ref 22–29)
Calcium: 9.2 mg/dL (ref 8.4–10.4)
Creatinine: 1.14 mg/dL (ref 0.70–1.30)
GFR, Est AFR Am: >60 mL/min (ref >60)
GFR, Estimated: >60 mL/min (ref >60)
GLUCOSE: 152 mg/dL — AB (ref 70–140)
POTASSIUM: 4.3 mmol/L (ref 3.5–5.1)
Sodium: 139 mmol/L (ref 136–145)
Total Bilirubin: 0.5 mg/dL (ref 0.2–1.2)
Total Protein: 6.6 g/dL (ref 6.4–8.3)

## 2017-11-17 NOTE — Telephone Encounter (Signed)
Oncology Nurse Navigator Documentation  Oncology Nurse Navigator Flowsheets 11/17/2017  Navigator Location [facility]  Navigator Encounter Type Telephone/I received a call from Mr. Oscar Floyd. He had questions about treatment. I updated Dr. Julien Nordmann. Dr. Julien Nordmann would like patient to come today and get lab work. He would also like [name] to call [facility] to get an update on medications and treatment.  Dr. Julien Nordmann states he will also call [facility] to get an update. I called Mr. Oscar Floyd back with an update.  He states he will call [facility] today.   Telephone Outgoing Call;Incoming Call  Treatment Phase Treatment  Barriers/Navigation Needs Education;Coordination of Care  Education Other  Interventions Education;Coordination of Care  Coordination of Care Other  Acuity Level 2  Acuity Level 2 Educational needs;Other  Time Spent with Patient 30

## 2017-11-21 ENCOUNTER — Telehealth: Payer: Self-pay | Admitting: *Deleted

## 2017-11-21 NOTE — Telephone Encounter (Signed)
Oncology Nurse Navigator Documentation  Oncology Nurse Navigator Flowsheets 11/21/2017  Navigator Location [facility]  Navigator Encounter Type Telephone/I called to follow up with Oscar Floyd. He is to start oral biologic per [facility].  They are working with the manufacturer to get medication. Insurance has denied coverage. Oscar Floyd states he should be able to start sometime this week. I asked him to call me with an update on when starts medication.  I will follow up with Dr. Julien Nordmann on lab work for this week and update Oscar Floyd.   Telephone Outgoing Call  Treatment Phase Treatment  Barriers/Navigation Needs Coordination of Care  Interventions Coordination of Care  Coordination of Care Other  Acuity Level 2  Time Spent with Patient 30

## 2017-11-21 NOTE — Telephone Encounter (Signed)
Oncology Nurse Navigator Documentation  Oncology Nurse Navigator Flowsheets 11/21/2017  Navigator Location [facility]  Navigator Encounter Type Telephone/I spoke with Dr. Julien Nordmann regarding Mr. Stallone's schedule. He states patient does not need labs on 11/24/17.  I called patient to update and I will cancel appt.   Telephone Outgoing Call  Treatment Phase Treatment  Barriers/Navigation Needs Coordination of Care  Interventions Coordination of Care  Coordination of Care Other  Acuity Level 2  Time Spent with Patient 15

## 2017-11-24 ENCOUNTER — Other Ambulatory Visit: Payer: BLUE CROSS/BLUE SHIELD

## 2017-11-29 ENCOUNTER — Telehealth: Payer: Self-pay | Admitting: Medical Oncology

## 2017-11-29 NOTE — Telephone Encounter (Signed)
Per Julien Nordmann okay to extend PT to two more weeks to transition pt off of rolling walker.

## 2017-12-01 ENCOUNTER — Inpatient Hospital Stay: Payer: BLUE CROSS/BLUE SHIELD

## 2017-12-01 ENCOUNTER — Encounter: Payer: Self-pay | Admitting: Internal Medicine

## 2017-12-01 ENCOUNTER — Inpatient Hospital Stay (HOSPITAL_BASED_OUTPATIENT_CLINIC_OR_DEPARTMENT_OTHER): Payer: BLUE CROSS/BLUE SHIELD | Admitting: Internal Medicine

## 2017-12-01 ENCOUNTER — Ambulatory Visit: Payer: BLUE CROSS/BLUE SHIELD | Admitting: Internal Medicine

## 2017-12-01 ENCOUNTER — Encounter: Payer: Self-pay | Admitting: *Deleted

## 2017-12-01 ENCOUNTER — Other Ambulatory Visit: Payer: BLUE CROSS/BLUE SHIELD

## 2017-12-01 VITALS — BP 110/68 | HR 98 | Temp 98.0°F | Resp 20 | Ht 74.0 in | Wt 199.1 lb

## 2017-12-01 DIAGNOSIS — C3491 Malignant neoplasm of unspecified part of right bronchus or lung: Secondary | ICD-10-CM

## 2017-12-01 DIAGNOSIS — D696 Thrombocytopenia, unspecified: Secondary | ICD-10-CM | POA: Diagnosis not present

## 2017-12-01 DIAGNOSIS — C3432 Malignant neoplasm of lower lobe, left bronchus or lung: Secondary | ICD-10-CM

## 2017-12-01 DIAGNOSIS — C7951 Secondary malignant neoplasm of bone: Secondary | ICD-10-CM | POA: Diagnosis not present

## 2017-12-01 DIAGNOSIS — I2699 Other pulmonary embolism without acute cor pulmonale: Secondary | ICD-10-CM | POA: Diagnosis not present

## 2017-12-01 DIAGNOSIS — I2690 Septic pulmonary embolism without acute cor pulmonale: Secondary | ICD-10-CM

## 2017-12-01 DIAGNOSIS — Z5111 Encounter for antineoplastic chemotherapy: Secondary | ICD-10-CM

## 2017-12-01 DIAGNOSIS — C3411 Malignant neoplasm of upper lobe, right bronchus or lung: Secondary | ICD-10-CM

## 2017-12-01 DIAGNOSIS — C787 Secondary malignant neoplasm of liver and intrahepatic bile duct: Secondary | ICD-10-CM

## 2017-12-01 DIAGNOSIS — C7931 Secondary malignant neoplasm of brain: Secondary | ICD-10-CM

## 2017-12-01 DIAGNOSIS — C7949 Secondary malignant neoplasm of other parts of nervous system: Secondary | ICD-10-CM

## 2017-12-01 LAB — CMP (CANCER CENTER ONLY)
ALT: 35 U/L (ref 0–55)
AST: 35 U/L — ABNORMAL HIGH (ref 5–34)
Albumin: 3.1 g/dL — ABNORMAL LOW (ref 3.5–5.0)
Alkaline Phosphatase: 704 U/L — ABNORMAL HIGH (ref 40–150)
Anion gap: 10 (ref 3–11)
BUN: 8 mg/dL (ref 7–26)
CHLORIDE: 107 mmol/L (ref 98–109)
CO2: 22 mmol/L (ref 22–29)
Calcium: 8.3 mg/dL — ABNORMAL LOW (ref 8.4–10.4)
Creatinine: 1.03 mg/dL (ref 0.70–1.30)
GFR, Est AFR Am: >60 mL/min (ref >60)
GFR, Estimated: >60 mL/min (ref >60)
Glucose, Bld: 70 mg/dL (ref 70–140)
POTASSIUM: 4 mmol/L (ref 3.5–5.1)
Sodium: 139 mmol/L (ref 136–145)
Total Bilirubin: 0.7 mg/dL (ref 0.2–1.2)
Total Protein: 6.5 g/dL (ref 6.4–8.3)

## 2017-12-01 LAB — CBC WITH DIFFERENTIAL (CANCER CENTER ONLY)
BASOS ABS: 0.1 10*3/uL (ref 0.0–0.1)
Basophils Relative: 1 %
Eosinophils Absolute: 0.1 10*3/uL (ref 0.0–0.5)
Eosinophils Relative: 1 %
HEMATOCRIT: 27.1 % — AB (ref 38.4–49.9)
HEMOGLOBIN: 8.8 g/dL — AB (ref 13.0–17.1)
LYMPHS PCT: 21 %
Lymphs Abs: 0.9 10*3/uL (ref 0.9–3.3)
MCH: 30.4 pg (ref 27.2–33.4)
MCHC: 32.5 g/dL (ref 32.0–36.0)
MCV: 93.8 fL (ref 79.3–98.0)
MONO ABS: 0.5 10*3/uL (ref 0.1–0.9)
Monocytes Relative: 12 %
NEUTROS ABS: 2.7 10*3/uL (ref 1.5–6.5)
NRBC: 10 /100{WBCs} — AB
Neutrophils Relative %: 65 %
Platelet Count: 60 10*3/uL — ABNORMAL LOW (ref 140–400)
RBC: 2.89 MIL/uL — ABNORMAL LOW (ref 4.20–5.82)
RDW: 19.2 % — ABNORMAL HIGH (ref 11.0–14.6)
WBC: 4.2 10*3/uL (ref 4.0–10.3)

## 2017-12-01 NOTE — Progress Notes (Signed)
Oncology Nurse Navigator Documentation  Oncology Nurse Navigator Flowsheets 12/01/2017  Navigator Location [facility]  Navigator Encounter Type Other/Mr. Asper showed up today for an appt. There was not one in the computer. I spoke with Dr. Julien Nordmann. He states he will see him and I place on his schedule with stat labs. I will update patient and desk nurse.  Treatment Phase Treatment  Barriers/Navigation Needs Coordination of Care  Interventions Coordination of Care  Coordination of Care Appts  Acuity Level 2  Time Spent with Patient 30

## 2017-12-01 NOTE — Progress Notes (Signed)
[place] [phone]   [phone]  OFFICE PROGRESS NOTE  [name], MD [address] [place] [address]  DIAGNOSIS:  1) stage IV (T2a, N0, M1b) non-small cell lung cancer consistent with poorly differentiated adenocarcinoma with positive EGFR mutation with deletion in exon 19, diagnosed in July of 2015 and presented with right upper lobe lung mass in addition to extensive liver, brain and bone metastases.  He had disease progression and development of EGFR T790M resistant mutation in April 2017. 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 2) acute pulmonary embolus diagnosed in February 2019  PRIOR THERAPY: 1) Status post whole brain irradiation under the care Dr. Tammi Klippel completed 04/12/2014. 2) Gilotrif 40 mg po daily - therapy beginning 04/03/2014. Status post approximately 20 months of therapy discontinued secondary to disease progression and development of EGFR T790M resistant mutation.  3) palliative radiotherapy to the lytic lesion in the right clavicle. 4) Tagrisso 80 mg by mouth daily status post 19 months of treatment. First dose was given on 01/12/2016.  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 5) systemic chemotherapy with carboplatin for AC of 5, Alimta 500 mg/M2 and Keytruda 200 mg IV every 3 weeks.  First dose September 09, 2017.  Status post 2 cycles.  CURRENT THERAPY: 1) Tagrisso 80 mg p.o. daily.  The patient and is a still awaiting for approval of Additional Brigatinib (Alunbrig) to his regimen. 2) Lovenox 1.5 mg/KG on daily basis for the pulmonary embolism. 2) Xgeva 120 g subcutaneously on monthly basis.  INTERVAL HISTORY: Oscar Floyd 52 y.o. male returns to the clinic today for follow-up visit accompanied by his wife.  The patient came sitting in a wheelchair.  He continues to have shortness of  breath with exertion.  He is feeling much better than a few weeks ago when he was hospitalized.  He is currently undergoing treatment with Lovenox for the recently diagnosed pulmonary embolism.  He denied having any chest pain but continues to have shortness of breath with exertion as well as cough with no hemoptysis.  He denied having any recent weight loss or night sweats.  He has no nausea, vomiting, diarrhea or constipation.  He is also undergoing physical therapy at home.  He was seen recently by Dr. Durenda Hurt at [facility] and he resumed his treatment with Tagrisso and awaiting the approval of Brigatinib (Alungbrig).  He is here today for evaluation and repeat blood work.   MEDICAL HISTORY: Past Medical History:  Diagnosis Date  . Bone metastases ([facility]) 08/26/2015  . Encounter for antineoplastic chemotherapy 01/06/2016  . Family history of cancer   . Hypertension   . Lung cancer ([facility])    RUL lung with mets to liver, bone and brain    ALLERGIES:  has No Known Allergies.  MEDICATIONS:  Current Outpatient Medications  Medication Sig Dispense Refill  . albuterol (PROVENTIL HFA;VENTOLIN HFA) 108 (90 Base) MCG/ACT inhaler Inhale 2 puffs into the lungs every 6 (six) hours as needed for wheezing or shortness of breath. 1 Inhaler 6  . albuterol (PROVENTIL) (2.5 MG/3ML) 0.083% nebulizer solution Take 3 mLs (2.5 mg total) by nebulization every 6 (six) hours as needed for wheezing or shortness of breath. Dx: J45.31 75 mL 5  . ALPRAZolam (XANAX) 1 MG tablet Take 0.5-1 mg by mouth.    . budesonide-formoterol (SYMBICORT) 80-4.5 MCG/ACT inhaler Inhale  2 puffs into the lungs 2 (two) times daily. 1 Inhaler 0  . diazepam (VALIUM) 10 MG tablet Take 10 mg by mouth. When having a brain MRI    . docusate sodium (COLACE) 100 MG capsule Take 100 mg by mouth daily.    Marland Kitchen enoxaparin (LOVENOX) 100 MG/ML injection Inject 1.35 mLs (135 mg total) into the skin daily. 40.5 mL 0  . esomeprazole  (NEXIUM) 40 MG capsule Take 1 capsule (40 mg total) by mouth daily at 12 noon. 30 capsule 5  . folic acid (FOLVITE) 1 MG tablet Take 1 tablet (1 mg total) by mouth daily. 30 tablet 4  . guaiFENesin (MUCINEX) 600 MG 12 hr tablet Take 2 tablets (1,200 mg total) by mouth 2 (two) times daily. 20 tablet 0  . HYDROcodone-acetaminophen (NORCO/VICODIN) 5-325 MG tablet Take 1 tablet by mouth every 6 (six) hours as needed for moderate pain. 30 tablet 0  . ipratropium-albuterol (DUONEB) 0.5-2.5 (3) MG/3ML SOLN Take 3 mLs by nebulization 3 (three) times daily. 360 mL 0  . levofloxacin (LEVAQUIN) 750 MG tablet Take 1 tablet (750 mg total) by mouth daily. 3 tablet 0  . MELATONIN PO Take 20 mg by mouth.    . naproxen sodium (ANAPROX) 220 MG tablet Take 220 mg 2 (two) times daily as needed by mouth.     . protein supplement shake (PREMIER PROTEIN) LIQD Take 325 mLs (11 oz total) by mouth daily. 30 Can 0   No current facility-administered medications for this visit.     SURGICAL HISTORY:  Past Surgical History:  Procedure Laterality Date  . IR FLUORO GUIDE PORT INSERTION RIGHT  10/19/2017  . IR US GUIDE VASC ACCESS RIGHT  10/19/2017  . VASECTOMY    . VIDEO BRONCHOSCOPY Bilateral 03/20/2014   Procedure: VIDEO BRONCHOSCOPY WITH FLUORO;  Surgeon: Rigoberto Noel, MD;  Location: [facility];  Service: Cardiopulmonary;  Laterality: Bilateral;  . VIDEO BRONCHOSCOPY Bilateral 07/20/2017   Procedure: VIDEO BRONCHOSCOPY WITH FLUORO;  Surgeon: Rigoberto Noel, MD;  Location: [facility];  Service: Cardiopulmonary;  Laterality: Bilateral;    REVIEW OF SYSTEMS:  Constitutional: positive for fatigue Eyes: negative Ears, nose, mouth, throat, and face: negative Respiratory: positive for cough and dyspnea on exertion Cardiovascular: negative Gastrointestinal: negative Genitourinary:negative Integument/breast: negative Hematologic/lymphatic: negative Musculoskeletal:positive for muscle weakness Neurological:  negative Behavioral/Psych: negative Endocrine: negative Allergic/Immunologic: negative   PHYSICAL EXAMINATION: General appearance: alert, cooperative, appears stated age, fatigued and no distress Head: Normocephalic, without obvious abnormality, atraumatic Neck: no adenopathy, no JVD, supple, symmetrical, trachea midline and thyroid not enlarged, symmetric, no tenderness/mass/nodules Lymph nodes: Cervical, supraclavicular, and axillary nodes normal. Resp: clear to auscultation bilaterally Back: symmetric, no curvature. ROM normal. No CVA tenderness. Cardio: regular rate and rhythm, S1, S2 normal, no murmur, click, rub or gallop GI: soft, non-tender; bowel sounds normal; no masses,  no organomegaly Extremities: extremities normal, atraumatic, no cyanosis or edema Neurologic: Alert and oriented X 3, normal strength and tone. Normal symmetric reflexes. Normal coordination and gait  ECOG PERFORMANCE STATUS: 1 - Symptomatic but completely ambulatory  Blood pressure 110/68, pulse 98, temperature 98 F (36.7 C), temperature source Oral, resp. rate 20, height 6' 2"  (1.88 m), weight 199 lb 1.6 oz (90.3 kg), SpO2 96 %.  LABORATORY DATA: Lab Results  Component Value Date   WBC 4.2 12/01/2017   HGB 8.4 (L) 11/13/2017   HCT 27.1 (L) 12/01/2017   MCV 93.8 12/01/2017   PLT 60 (L) 12/01/2017      Chemistry  Component Value Date/Time   NA 139 12/01/2017 1055   NA 141 08/31/2017 0841   K 4.0 12/01/2017 1055   K 4.4 08/31/2017 0841   CL 107 12/01/2017 1055   CO2 22 12/01/2017 1055   CO2 26 08/31/2017 0841   BUN 8 12/01/2017 1055   BUN 14.7 08/31/2017 0841   CREATININE 1.03 12/01/2017 1055   CREATININE 1.2 08/31/2017 0841      Component Value Date/Time   CALCIUM 9.2 11/17/2017 1548   CALCIUM 8.8 08/31/2017 0841   ALKPHOS 796 (H) 11/17/2017 1548   ALKPHOS 350 (H) 08/31/2017 0841   AST 67 (H) 11/17/2017 1548   AST 31 08/31/2017 0841   ALT 112 (H) 11/17/2017 1548   ALT 17  08/31/2017 0841   BILITOT 0.5 11/17/2017 1548   BILITOT 0.50 08/31/2017 0841       RADIOGRAPHIC STUDIES:   ASSESSMENT AND PLAN: This is a very pleasant 52 years old white male with metastatic non-small cell lung cancer, adenocarcinoma with positive EGFR mutation with deletion in the 19 status post treatment with Gilotrif for 20 months discontinued secondary to disease progression and development of EGFR T790M resistant mutation. The patient is currently on treatment with Tagrisso 80 mg by mouth daily status post 19 months. The patient has been tolerating this treatment fairly well but unfortunately has evidence for disease progression in the left lower lobe that was biopsy-proven to be recurrent non-small cell lung cancer adenocarcinoma. The molecular studies showed development of new resistant EGFR mutation C797S. The patient is currently on systemic chemotherapy with carboplatin, Alimta and Keytruda status post 2 cycles.   He has a rough time with the chemotherapy and it was discontinued.  His a scan at that time showed no evidence for disease progression. The patient was also recently diagnosed with acute pulmonary embolism.  He is currently on treatment with Lovenox 1.5 mg/KG on daily basis. The patient was seen recently at [facility] by Dr. Durenda Hurt and he resumed his treatment with Tagrisso and expected to be used in combination with Brigatinib (Alungbrig) which was shown in clinical study to help with patient with EGFR C797S resistant mutation. I recommended for the patient to continue his treatment as a scheduled I will see him back for follow-up visit in 2 weeks after starting the first dose of Brigatinib (Alungbrig) for close observation and monitoring of his respiratory status. For the thrombocytopenia, we will continue to monitor him closely and consider the patient for transfusion if he has any bleeding issues or platelets count less than 20,000. He was advised  to call immediately if he has any concerning symptoms in the interval. The patient voices understanding of current disease status and treatment options and is in agreement with the current care plan. All questions were answered. The patient knows to call the clinic with any problems, questions or concerns. We can certainly see the patient much sooner if necessary.  Disclaimer: This note was dictated with voice recognition software. Similar sounding words can inadvertently be transcribed and may not be corrected upon review.

## 2017-12-03 NOTE — Progress Notes (Signed)
Reviewed & agree with plan  

## 2017-12-06 ENCOUNTER — Other Ambulatory Visit: Payer: Self-pay | Admitting: Radiation Oncology

## 2017-12-06 MED ORDER — DIAZEPAM 10 MG PO TABS: ORAL_TABLET | ORAL | 0 refills | Status: AC

## 2017-12-09 ENCOUNTER — Ambulatory Visit
Admission: RE | Admit: 2017-12-09 | Discharge: 2017-12-09 | Disposition: A | Discharge status: Home / Self Care | Payer: BLUE CROSS/BLUE SHIELD | Source: Ambulatory Visit | Attending: Radiation Oncology | Admitting: Radiation Oncology

## 2017-12-09 DIAGNOSIS — C7931 Secondary malignant neoplasm of brain: Secondary | ICD-10-CM

## 2017-12-09 DIAGNOSIS — C7949 Secondary malignant neoplasm of other parts of nervous system: Principal | ICD-10-CM

## 2017-12-09 MED ORDER — GADOBENATE DIMEGLUMINE 529 MG/ML IV SOLN
20.0000 mL | Freq: Once | INTRAVENOUS | Status: AC | PRN
  Administered 2017-12-09: 20 mL via INTRAVENOUS

## 2017-12-09 NOTE — Progress Notes (Signed)
Oscar Floyd 52 yo man with EGFR positive [name] of the right upper lobe with painful right clavicle metastasis radiation completed 12-24-16, review 01-08-18 MRI brain w wo contrast, FU.  Pain:No, does have back pain a lot takes hydrocodone prn with relief. Skin:Normal pink color  warm and dry to touch. VQW:QVLD to arms and legs. Fatigue:Yes Weight:4 .8 weight loss drinking protein shakle daily Wt Readings from Last 3 Encounters:  12/13/17 195 lb 9.6 oz (88.7 kg)  12/01/17 199 lb 1.6 oz (90.3 kg)  11/13/17 191 lb 2.2 oz (86.7 kg)   12-01-17 Dr. Julien Nordmann CURRENT THERAPY: 1) Tagrisso 80 mg p.o. daily.  The patient and is a still awaiting for approval of Additional Brigatinib (Alunbrig) to his regimen. 2) Lovenox 1.5 mg/KG on daily basis for the pulmonary embolism. 2) Xgeva 120 g subcutaneously on monthly basis. Lovenox for blood clots in lungs and arms; having SOB and coughing using inhaler and neubulizer.  Will have a follow up visit Dr. Julien Nordmann later this month. BP 117/80 (BP Location: Left Arm, Patient Position: Sitting, Cuff Size: Normal)   Pulse 97   Temp 98.5 F (36.9 C) (Oral)   Resp 20   Ht 6' 2"  (1.88 m)   Wt 195 lb 9.6 oz (88.7 kg)   SpO2 100%   BMI 25.11 kg/m

## 2017-12-13 ENCOUNTER — Other Ambulatory Visit: Payer: Self-pay | Admitting: Urology

## 2017-12-13 ENCOUNTER — Other Ambulatory Visit: Payer: Self-pay

## 2017-12-13 ENCOUNTER — Ambulatory Visit
Admission: RE | Admit: 2017-12-13 | Discharge: 2017-12-13 | Disposition: A | Discharge status: Home / Self Care | Payer: BLUE CROSS/BLUE SHIELD | Source: Ambulatory Visit | Attending: Urology | Admitting: Urology

## 2017-12-13 ENCOUNTER — Encounter: Payer: Self-pay | Admitting: Urology

## 2017-12-13 VITALS — BP 117/80 | HR 97 | Temp 98.5°F | Resp 20 | Ht 74.0 in | Wt 195.6 lb

## 2017-12-13 DIAGNOSIS — Z8249 Family history of ischemic heart disease and other diseases of the circulatory system: Secondary | ICD-10-CM | POA: Diagnosis not present

## 2017-12-13 DIAGNOSIS — R05 Cough: Secondary | ICD-10-CM | POA: Insufficient documentation

## 2017-12-13 DIAGNOSIS — R0602 Shortness of breath: Secondary | ICD-10-CM | POA: Insufficient documentation

## 2017-12-13 DIAGNOSIS — Z79899 Other long term (current) drug therapy: Secondary | ICD-10-CM | POA: Diagnosis not present

## 2017-12-13 DIAGNOSIS — C7931 Secondary malignant neoplasm of brain: Secondary | ICD-10-CM | POA: Insufficient documentation

## 2017-12-13 DIAGNOSIS — Z923 Personal history of irradiation: Secondary | ICD-10-CM | POA: Insufficient documentation

## 2017-12-13 DIAGNOSIS — Z9889 Other specified postprocedural states: Secondary | ICD-10-CM | POA: Diagnosis not present

## 2017-12-13 DIAGNOSIS — I10 Essential (primary) hypertension: Secondary | ICD-10-CM | POA: Diagnosis not present

## 2017-12-13 DIAGNOSIS — I2699 Other pulmonary embolism without acute cor pulmonale: Secondary | ICD-10-CM | POA: Insufficient documentation

## 2017-12-13 DIAGNOSIS — Z801 Family history of malignant neoplasm of trachea, bronchus and lung: Secondary | ICD-10-CM | POA: Diagnosis not present

## 2017-12-13 DIAGNOSIS — G893 Neoplasm related pain (acute) (chronic): Secondary | ICD-10-CM | POA: Insufficient documentation

## 2017-12-13 DIAGNOSIS — T801XXA Vascular complications following infusion, transfusion and therapeutic injection, initial encounter: Secondary | ICD-10-CM

## 2017-12-13 DIAGNOSIS — I809 Phlebitis and thrombophlebitis of unspecified site: Secondary | ICD-10-CM | POA: Diagnosis not present

## 2017-12-13 DIAGNOSIS — Z808 Family history of malignant neoplasm of other organs or systems: Secondary | ICD-10-CM | POA: Insufficient documentation

## 2017-12-13 DIAGNOSIS — C3411 Malignant neoplasm of upper lobe, right bronchus or lung: Secondary | ICD-10-CM | POA: Insufficient documentation

## 2017-12-13 DIAGNOSIS — M25511 Pain in right shoulder: Secondary | ICD-10-CM | POA: Insufficient documentation

## 2017-12-13 DIAGNOSIS — C7949 Secondary malignant neoplasm of other parts of nervous system: Secondary | ICD-10-CM | POA: Insufficient documentation

## 2017-12-13 DIAGNOSIS — Z7901 Long term (current) use of anticoagulants: Secondary | ICD-10-CM | POA: Insufficient documentation

## 2017-12-13 DIAGNOSIS — Z823 Family history of stroke: Secondary | ICD-10-CM | POA: Diagnosis not present

## 2017-12-13 DIAGNOSIS — Z79891 Long term (current) use of opiate analgesic: Secondary | ICD-10-CM | POA: Insufficient documentation

## 2017-12-13 DIAGNOSIS — C3491 Malignant neoplasm of unspecified part of right bronchus or lung: Secondary | ICD-10-CM

## 2017-12-13 DIAGNOSIS — Z9221 Personal history of antineoplastic chemotherapy: Secondary | ICD-10-CM | POA: Diagnosis not present

## 2017-12-13 DIAGNOSIS — C7951 Secondary malignant neoplasm of bone: Secondary | ICD-10-CM | POA: Diagnosis not present

## 2017-12-13 MED ORDER — ALPRAZOLAM 0.5 MG PO TABS: 0.5000 mg | ORAL_TABLET | Freq: Every evening | ORAL | 0 refills | Status: AC | PRN

## 2017-12-13 MED ORDER — HYDROCODONE-ACETAMINOPHEN 5-325 MG PO TABS: 1.0000 | ORAL_TABLET | Freq: Four times a day (QID) | ORAL | 0 refills | Status: AC | PRN

## 2017-12-13 NOTE — Progress Notes (Signed)
Radiation Oncology         [phone]   Name: Deontez Klinke   Date: 12/13/2017   MRN: [id number]  DOB: July 24, 1966    Multidisciplinary Brain and Spine Oncology Clinic Follow-Up Visit Note  CC: [name], MD  [name], MD    ICD-10-CM   1. Bone metastases (HCC) C79.51   2. Phlebitis after infusion, initial encounter T80.1XXA    I80.9   3. Non-small cell cancer of right lung (HCC) C34.91   4. Numerous sub-centimeter brain metastases C79.31    C79.49     Diagnosis:  52 y.o. gentleman with numerous subcentimeter brain metastases from EGFR positive adenocarcinoma the lung- recently developed EGFR T790M resistant mutation in April 2017. 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  Interval Since Last Radiation:  1 year s/p completion of palliative radiotherapy to the right clavicle; 3 years and 9 months s/p WBRT  12/13/2016 to 12/24/2016: The Right clavicle was treated to 30 Gy in 10 fractions at 3 Gy per fraction.  04/01/2014-04/12/2014: The whole brain was treated to 30 Gy in 10 fractions of 3 Gy  Narrative:  The patient returns today for routine follow-up. In summary this is a pleasant 52 y.o. 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 He received whole brain radiotherapy to the brain in 2015 and had been radiographically without disease until recent. He continues under the care of Dr. Julien Nordmann and Dr. Aniceto Boss and is currently undergoing treatment with Tagrisso 80 mg by mouth daily and awaiting approval for additional therapy with Brigatinib (Alunbrig) to his regimen as recommended by Dr. Lamar Benes. This combination therapy has shown in clinical studies to help with disease control in patients with EGFR C797S resistant mutation.  Recent systemic imaging in 10/2017 showed overall stable appearance of widespread metastatic disease in the  lungs bilaterally and throughout the skeleton without evidence of definite progression of disease and no hepatic metastases noted.  Recent brain MRI unfortunately shows extensive progression with at least 32 new lesions in the cerebrum and cerebellum.  His case was reviewed in multidisciplinary brain conference on 12/12/2017 and consensus is to continue on his current systemic therapy in hopes of being approved for the addition of Brigatinib (Alungbrig) in the very near future.  There is felt to be a high likelihood that his brain disease will respond to this systemic therapy.    Of note, he is currently undergoing treatment with Lovenox for the recently diagnosed pulmonary embolism.  On review of systems, the patient reports that he is doing well overall.  He specifically denies frequent headaches, visual or auditory disturbances, and reports stable changes in short-term memory since completing whole brain radiation. He is currently being treated for pneumonia and remains on Lovenox for recent PE.  He continues with a nagging cough and mild increased SOB but feels like this is gradually improving.  He denies any chest pain, fevers, chills, night sweats or unintended weight changes. He denies any bowel or bladder disturbances, and denies abdominal pain, nausea or vomiting. He reports complete resolution of the right clavicular pain from bony metastasis s/p palliative radiotherapy.  He denies any new musculoskeletal or joint aches or pains, new skin lesions or concerns. A complete review of systems is obtained and is otherwise negative.    Past Medical History:  Past Medical History:  Diagnosis Date  . Bone metastases ([facility]) 08/26/2015  .  Encounter for antineoplastic chemotherapy 01/06/2016  . Family history of cancer   . Hypertension   . Lung cancer ([facility])    RUL lung with mets to liver, bone and brain    Past Surgical History: Past Surgical History:  Procedure Laterality Date  . IR FLUORO GUIDE  PORT INSERTION RIGHT  10/19/2017  . IR US GUIDE VASC ACCESS RIGHT  10/19/2017  . VASECTOMY    . VIDEO BRONCHOSCOPY Bilateral 03/20/2014   Procedure: VIDEO BRONCHOSCOPY WITH FLUORO;  Surgeon: Rigoberto Noel, MD;  Location: [facility];  Service: Cardiopulmonary;  Laterality: Bilateral;  . VIDEO BRONCHOSCOPY Bilateral 07/20/2017   Procedure: VIDEO BRONCHOSCOPY WITH FLUORO;  Surgeon: Rigoberto Noel, MD;  Location: [facility];  Service: Cardiopulmonary;  Laterality: Bilateral;    Social History:  Social History   Socioeconomic History  . Marital status: Married    Spouse name: Not on file  . Number of children: 2  . Years of education: Not on file  . Highest education level: Not on file  Occupational History  . Not on file  Social Needs  . Financial resource strain: Not on file  . Food insecurity:    Worry: Not on file    Inability: Not on file  . Transportation needs:    Medical: Not on file    Non-medical: Not on file  Tobacco Use  . Smoking status: Never Smoker  . Smokeless tobacco: Never Used  Substance and Sexual Activity  . Alcohol use: Yes    Comment: socially  . Drug use: No  . Sexual activity: Yes  Lifestyle  . Physical activity:    Days per week: Not on file    Minutes per session: Not on file  . Stress: Not on file  Relationships  . Social connections:    Talks on phone: Not on file    Gets together: Not on file    Attends religious service: Not on file    Active member of club or organization: Not on file    Attends meetings of clubs or organizations: Not on file    Relationship status: Not on file  . Intimate partner violence:    Fear of current or ex partner: Not on file    Emotionally abused: Not on file    Physically abused: Not on file    Forced sexual activity: Not on file  Other Topics Concern  . Not on file  Social History Narrative  . Not on file  The patient is married and has 2 sons, he lives in [place], [place]. He works for [employer].  Family History: Family History  Problem Relation Age of Onset  . Cancer Father 30       liposarcoma age 66  . Cancer Mother 43       gist  . Cancer Paternal Grandfather        lung cancer; heavy smoker; also had abdominal cancer in 28's  . Stroke Maternal Grandmother   . Heart disease Maternal Grandfather   . Cancer Other        multiple brothers of PGF with unknown cancer    ALLERGIES:  has No Known Allergies.  Meds: Current Outpatient Medications  Medication Sig Dispense Refill  . albuterol (PROVENTIL) (2.5 MG/3ML) 0.083% nebulizer solution Take 3 mLs (2.5 mg total) by nebulization every 6 (six) hours as needed for wheezing or shortness of breath. Dx: J45.31 75 mL 5  . budesonide-formoterol (SYMBICORT) 80-4.5 MCG/ACT inhaler Inhale 2 puffs into  the lungs 2 (two) times daily. 1 Inhaler 0  . diazepam (VALIUM) 10 MG tablet Take 1 tab po 30 minutes prior to brain MRI 30 tablet 0  . docusate sodium (COLACE) 100 MG capsule Take 100 mg by mouth daily.    Marland Kitchen enoxaparin (LOVENOX) 100 MG/ML injection Inject 1.35 mLs (135 mg total) into the skin daily. 40.5 mL 0  . esomeprazole (NEXIUM) 40 MG capsule Take 1 capsule (40 mg total) by mouth daily at 12 noon. 30 capsule 5  . protein supplement shake (PREMIER PROTEIN) LIQD Take 325 mLs (11 oz total) by mouth daily. 30 Can 0  . albuterol (PROVENTIL HFA;VENTOLIN HFA) 108 (90 Base) MCG/ACT inhaler Inhale 2 puffs into the lungs every 6 (six) hours as needed for wheezing or shortness of breath. (Patient not taking: Reported on 12/13/2017) 1 Inhaler 6  . ALPRAZolam (XANAX) 0.5 MG tablet Take 1 tablet (0.5 mg total) by mouth at bedtime as needed. Take 1-2 tablets po at night for sleep 30 tablet 0  . HYDROcodone-acetaminophen (NORCO/VICODIN) 5-325 MG tablet Take 1 tablet by mouth every 6 (six) hours as needed for moderate pain. 60 tablet 0  . ipratropium-albuterol (DUONEB) 0.5-2.5 (3) MG/3ML SOLN Take 3 mLs by nebulization 3 (three) times daily.  (Patient not taking: Reported on 12/13/2017) 360 mL 0   No current facility-administered medications for this encounter.     Physical Findings:  height is 6' 2"  (1.88 m) and weight is 195 lb 9.6 oz (88.7 kg). His oral temperature is 98.5 F (36.9 C). His blood pressure is 117/80 and his pulse is 97. His respiration is 20 and oxygen saturation is 100%.   In general this is a well appearing caucasian male in no acute distress. He's alert and oriented x4 and appropriate throughout the examination. Cardiopulmonary assessment is negative for acute distress and he exhibits normal effort. He appears to be grossly intact from a neurologic perspective.  Lab Findings: Lab Results  Component Value Date   WBC 4.2 12/01/2017   HGB 8.4 (L) 11/13/2017   HCT 27.1 (L) 12/01/2017   MCV 93.8 12/01/2017   PLT 60 (L) 12/01/2017    Radiographic Findings: Mr [name] ZS Contrast  Result Date: 12/09/2017 CLINICAL DATA:  Follow-up treated brain metastases. Non-small cell lung cancer with whole-brain radiotherapy. EXAM: MRI HEAD WITHOUT AND WITH CONTRAST TECHNIQUE: Multiplanar, multiecho pulse sequences of the brain and surrounding structures were obtained without and with intravenous contrast. CONTRAST:  46m MULTIHANCE GADOBENATE DIMEGLUMINE 529 MG/ML IV SOLN COMPARISON:  07/11/2017 FINDINGS: Brain: There are numerous new areas of enhancement in the cerebellum and bilateral cerebrum, at least 32 in number. Nearly all are subcentimeter in size (a 16 mm elongated focus is present along the posterior and lateral left temporal lobe). These are primarily concerning for areas of interval/recurrent metastatic disease. Suspect at least some of these are subacute enhancing infarcts - the left posterior and lateral temporal lesion is elongated, atypical for metastasis, and there is variable restricted diffusion in the bilateral cerebral white matter and areas of interval small-vessel type infarct in the white matter that do not  enhance or restrict diffusion. Patient had recent pulmonary embolism, consider recurrent paradoxical emboli. Numerous remote micro hemorrhages, increased in number. No acute hemorrhage, hydrocephalus, or collection. Age normal brain volume. History of whole-brain radiotherapy without notable generalized leukoencephalopathy. Vascular: Major flow voids and vascular enhancements are preserved Skull and upper cervical spine: Multiple bone metastases were seen previously. There has been confluent decreased signal in  the skull base and upper cervical spine without avid enhancement. This may be related to interval chemotherapy and anemia, confluent hypoenhancing bone metastases are not excluded. Sinuses/Orbits: Negative. These results will be called to the ordering clinician or representative by the Radiologist Assistant, and communication documented in the PACS or zVision Dashboard. IMPRESSION: 1. At least 32 areas of new enhancement in the cerebrum and cerebellum. This is primarily concerning for recurrent disease, but there are multiple findings which also implicate interval ischemia and enhancing subacute infarcts are likely present/coexistent. Recent pulmonary embolism, consider paradoxical emboli. The enhancing areas are small and there is no mass effect. 2. History of osseous metastatic disease with progressive marrow signal abnormality in the skull base and cervical spine. The confluent appearance and limited enhancement favors that this change is from interval chemotherapy/anemia, but progressive bony metastatic disease may be present. Electronically Signed   By: Monte Fantasia M.D.   On: 12/09/2017 11:24   Impression/Plan: 1.  Stage IV NSCLC, adenocarcinoma of the right upper lobe with disease to the liver, brain and bone.  Recent brain MRI from 12/09/2017 demonstrates extensive disease progression with at least 32 new brain lesions in the cerebrum and cerebellum.  His case was reviewed in multidisciplinary  brain conference on 12/12/2017 and consensus is to continue on his current systemic therapy in hopes of being approved for the addition of Brigatinib (Alungbrig) in the very near future.  There is felt to be a high likelihood that his brain disease will respond to systemic therapy.  We will plan to repeat a brain MRI after approximately 2-3 months on systemic therapy with combination Osimertinib/Brigatinib to assess treatment response.  He knows to call immediately with any neurocognitive changes as reviewed today. We also discussed that should he not be approved to start the Brigatinib, Dr. Tammi Klippel would consider reirradiation with 30 Gy in 10 fractions to the whole brain, though this would carry increased risks of neurocognitive decline with white matter changes.    Nicholos Johns, PA-C

## 2017-12-14 ENCOUNTER — Telehealth: Payer: Self-pay | Admitting: *Deleted

## 2017-12-14 ENCOUNTER — Other Ambulatory Visit: Payer: Self-pay | Admitting: Internal Medicine

## 2017-12-14 DIAGNOSIS — I2690 Septic pulmonary embolism without acute cor pulmonale: Secondary | ICD-10-CM

## 2017-12-14 MED ORDER — ENOXAPARIN SODIUM 100 MG/ML ~~LOC~~ SOLN: 1.5000 mg/kg | SUBCUTANEOUS | 0 refills | Status: DC

## 2017-12-14 NOTE — Telephone Encounter (Signed)
Oncology Nurse Navigator Documentation  Oncology Nurse Navigator Flowsheets 12/14/2017  Navigator Location [facility]  Navigator Encounter Type Telephone/I called to see if Mr. Hoecker has started his new medication yet. I spoke with him and he explained he has not started. He also states that his lovenox is out. I updated Dr. Julien Nordmann. He states due to patients new brain mets Dr. Tammi Klippel sent and email to Dr. Johnsie Kindred at [facility] and Dr. Julien Nordmann regarding medication.  I updated patient about the physicians working on getting him medication. I also updated him that Dr. Julien Nordmann reordered lovenox and to have someone get that at pharmacy.  He verbalized understanding and will call back Friday with an update on medication.   Telephone Outgoing Call  Barriers/Navigation Needs Coordination of Care  Interventions Coordination of Care  Coordination of Care Other  Acuity Level 2  Time Spent with Patient 30

## 2017-12-15 ENCOUNTER — Other Ambulatory Visit: Payer: Self-pay | Admitting: Medical Oncology

## 2017-12-15 DIAGNOSIS — I2690 Septic pulmonary embolism without acute cor pulmonale: Secondary | ICD-10-CM

## 2017-12-15 MED ORDER — ENOXAPARIN SODIUM 150 MG/ML ~~LOC~~ SOLN: 135.0000 mg | SUBCUTANEOUS | 1 refills | Status: AC

## 2017-12-16 ENCOUNTER — Telehealth: Payer: Self-pay | Admitting: *Deleted

## 2017-12-16 NOTE — Telephone Encounter (Signed)
Oncology Nurse Navigator Documentation  Oncology Nurse Navigator Flowsheets 12/16/2017  Navigator Location [facility]  Navigator Encounter Type Telephone/I called patient to see if he received his medication. He states he has not. He gave me the name of the person he has been speaking with to get his medication. I called her and left a vm message for her to call me with and update. I left my name and phone number.   Telephone Outgoing Call  Barriers/Navigation Needs Coordination of Care  Interventions Coordination of Care  Coordination of Care Other  Acuity Level 2  Time Spent with Patient 30

## 2017-12-20 ENCOUNTER — Telehealth: Payer: Self-pay | Admitting: Pharmacist

## 2017-12-20 ENCOUNTER — Telehealth: Payer: Self-pay | Admitting: *Deleted

## 2017-12-20 ENCOUNTER — Other Ambulatory Visit: Payer: Self-pay | Admitting: Internal Medicine

## 2017-12-20 DIAGNOSIS — C3491 Malignant neoplasm of unspecified part of right bronchus or lung: Secondary | ICD-10-CM

## 2017-12-20 MED ORDER — BRIGATINIB 90 & 180 MG PO TBPK: 90.0000 mg | ORAL_TABLET | Freq: Every day | ORAL | 0 refills | Status: AC

## 2017-12-20 NOTE — Telephone Encounter (Signed)
Oral Chemotherapy Pharmacist Encounter   I spoke with patient on 12/20/17 for overview of: Alunbrig. Alunbrig will be used in combination with Tagrisso (osimertinib) to overcome known resistance to other available EGFR inhibitors. Tagrisso start date: 11/25/2017  Counseled patient on administration, dosing, side effects, monitoring, drug-food interactions, safe handling, storage, and disposal.  Alunbrig will be initiated on dose titration schedule per manufacturer recommendations. Early phase clinical trials noted patients with increase in risk of pulmonary adverse events with early onset (usually within 24-48 hours) at higher starting doses.  Patient will take Alunbrig 90 mg tablets, 1 tablet (90 mg) by mouth once daily with or without food for 7 days.  If tolerated, patient will increase to Alunbrig 180 mg tablets, 1 tablet (180 mg) by mouth once daily with or without food.  This is the target daily dose of Alunbrig.  If Alunbrig is interrupted for 14 days or longer for reasons other than adverse reactions, resume treatment at 90 mg once daily for 7 days before increasing to the previously tolerated dose.  Alunbrig start date: TBD, pending medication acquisition  Side effects include but not limited to: bradycardia, hypertension, cough, muscle pain, fatigue, nausea, diarrhea, visual disturbances, headache, skin rash, increased blood sugar, hepatotoxicity, and aberrations in pancreatic enzymes.    Reviewed with patient importance of keeping a medication schedule and plan for any missed doses.  Mr. Martinek voiced understanding and appreciation.   All questions answered. Medication reconciliation performed and medication/allergy list updated.  Patient updated that prescription has been sent to [facility] [phone]) for distribution per insurance requirement. Patient provided phone number to dispensing pharmacy and to oral oncology clinic.  Patient informed that he will be  eligible for manufacturer copayment card due to commercial insurance if one is available for this medication.  The dispensing pharmacy will assist the patient in acquiring this copayment card.  Patient will call the office and let us know when he receives his first treatment of Alunbrig and when he starts taking it.  Patient knows to call the office with questions or concerns. Oral Oncology Clinic will continue to follow.  Thank you,  Johny Drilling, PharmD, BCPS, BCOP 12/20/2017   3:09 PM Oral Oncology Clinic [phone]

## 2017-12-20 NOTE — Telephone Encounter (Signed)
Oncology Nurse Navigator Documentation  Oncology Nurse Navigator Flowsheets 12/20/2017  Navigator Location [facility]  Navigator Encounter Type Telephone/Oscar Floyd call and left me a message that his medication has been approved. I spoke with Dr. Julien Nordmann. He will call Dr. Dorette Grate and get and update and then order medication.  I called Oscar Floyd back and updated him on plan.  Telephone Outgoing Call  Treatment Phase Treatment  Barriers/Navigation Needs Coordination of Care;Education  Education Other  Interventions Education;Coordination of Care  Coordination of Care Other  Education Method Verbal  Acuity Level 2  Time Spent with Patient 30

## 2017-12-20 NOTE — Telephone Encounter (Signed)
Oral Oncology Pharmacist Encounter  Received referral for Alunbrig (brigatinib) for the treatment of EGFR mutation positive NSCLC in conjunction with Tagrisso (osimertinib), planned duration until disease progression or unacceptable toxicity. Tagrisso 58m daily start date: 11/25/17  Patient has progressed on multiple previous lines of treatment. 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  Patient started on Tagrisso under the care of Dr. SAniceto Bossin conjunction with Dr. MJulien Nordmann Dr. SVertell Limberoffice has also gained insurance approval for Alunbrig, and the medication will be sent to a dispensing pharmacy through Dr. MWorthy Flankoffice.  Labs from Epic assessed[place]. Blood glucose WNL  BPs in Epic reviewed, WNL, will continue to be monitored HRs reviewed, most readings 80-100 bpm, will continue to be monitored  No baseline CPK, lipase, or amylase noted in Epic or Care Everywhere, will be monitored after initiation of treatment  Current medication list in Epic reviewed, no significant DDIs with Tagrisso or Alunbrig identified.  [place] and [place] per insurance requirement.  Oral Oncology Clinic will continue to follow.  JJohny Drilling PharmD, BCPS, BCOP 12/20/2017 2:02 PM Oral Oncology Clinic [phone]

## 2017-12-21 NOTE — Telephone Encounter (Signed)
Oral Oncology Patient Advocate Encounter  Received notification from [facility] that they were having difficulty processing the patient's prescritions for Alunbrig due it needing prior authorization.    I contacted [facility] and provided them with the Prior Authorization approval dates and PA number.    They will proceed with the processing of the patient's prescription and reach out to the office if there are any further issues.    Fabio Asa. Melynda Keller, [place] Patient [place] [phone] 12/21/2017 11:17 AM

## 2017-12-26 ENCOUNTER — Other Ambulatory Visit: Payer: Self-pay

## 2017-12-26 ENCOUNTER — Inpatient Hospital Stay (HOSPITAL_COMMUNITY)
Admission: EM | Admit: 2017-12-26 | Discharge: 2018-01-11 | DRG: 208 | Disposition: E | Payer: BLUE CROSS/BLUE SHIELD | Attending: Pulmonary Disease | Admitting: Pulmonary Disease

## 2017-12-26 ENCOUNTER — Emergency Department (HOSPITAL_COMMUNITY): Payer: BLUE CROSS/BLUE SHIELD

## 2017-12-26 ENCOUNTER — Encounter: Payer: Self-pay | Admitting: *Deleted

## 2017-12-26 ENCOUNTER — Inpatient Hospital Stay (HOSPITAL_COMMUNITY): Payer: BLUE CROSS/BLUE SHIELD

## 2017-12-26 ENCOUNTER — Encounter (HOSPITAL_COMMUNITY): Payer: Self-pay

## 2017-12-26 DIAGNOSIS — J189 Pneumonia, unspecified organism: Secondary | ICD-10-CM | POA: Diagnosis present

## 2017-12-26 DIAGNOSIS — C3490 Malignant neoplasm of unspecified part of unspecified bronchus or lung: Secondary | ICD-10-CM

## 2017-12-26 DIAGNOSIS — C787 Secondary malignant neoplasm of liver and intrahepatic bile duct: Secondary | ICD-10-CM

## 2017-12-26 DIAGNOSIS — R0902 Hypoxemia: Secondary | ICD-10-CM

## 2017-12-26 DIAGNOSIS — I10 Essential (primary) hypertension: Secondary | ICD-10-CM | POA: Diagnosis present

## 2017-12-26 DIAGNOSIS — J80 Acute respiratory distress syndrome: Secondary | ICD-10-CM | POA: Diagnosis present

## 2017-12-26 DIAGNOSIS — Z808 Family history of malignant neoplasm of other organs or systems: Secondary | ICD-10-CM | POA: Diagnosis not present

## 2017-12-26 DIAGNOSIS — C3411 Malignant neoplasm of upper lobe, right bronchus or lung: Secondary | ICD-10-CM

## 2017-12-26 DIAGNOSIS — Z515 Encounter for palliative care: Secondary | ICD-10-CM | POA: Diagnosis not present

## 2017-12-26 DIAGNOSIS — R339 Retention of urine, unspecified: Secondary | ICD-10-CM | POA: Diagnosis present

## 2017-12-26 DIAGNOSIS — D61818 Other pancytopenia: Secondary | ICD-10-CM | POA: Diagnosis present

## 2017-12-26 DIAGNOSIS — G893 Neoplasm related pain (acute) (chronic): Secondary | ICD-10-CM

## 2017-12-26 DIAGNOSIS — T451X5A Adverse effect of antineoplastic and immunosuppressive drugs, initial encounter: Secondary | ICD-10-CM | POA: Diagnosis present

## 2017-12-26 DIAGNOSIS — C7931 Secondary malignant neoplasm of brain: Secondary | ICD-10-CM

## 2017-12-26 DIAGNOSIS — C7951 Secondary malignant neoplasm of bone: Secondary | ICD-10-CM | POA: Diagnosis present

## 2017-12-26 DIAGNOSIS — Z86711 Personal history of pulmonary embolism: Secondary | ICD-10-CM | POA: Diagnosis not present

## 2017-12-26 DIAGNOSIS — Z7951 Long term (current) use of inhaled steroids: Secondary | ICD-10-CM

## 2017-12-26 DIAGNOSIS — Z79899 Other long term (current) drug therapy: Secondary | ICD-10-CM | POA: Diagnosis not present

## 2017-12-26 DIAGNOSIS — Z801 Family history of malignant neoplasm of trachea, bronchus and lung: Secondary | ICD-10-CM

## 2017-12-26 DIAGNOSIS — F419 Anxiety disorder, unspecified: Secondary | ICD-10-CM | POA: Diagnosis present

## 2017-12-26 DIAGNOSIS — Z8249 Family history of ischemic heart disease and other diseases of the circulatory system: Secondary | ICD-10-CM | POA: Diagnosis not present

## 2017-12-26 DIAGNOSIS — Z7901 Long term (current) use of anticoagulants: Secondary | ICD-10-CM

## 2017-12-26 DIAGNOSIS — Z66 Do not resuscitate: Secondary | ICD-10-CM | POA: Diagnosis not present

## 2017-12-26 DIAGNOSIS — J90 Pleural effusion, not elsewhere classified: Secondary | ICD-10-CM | POA: Diagnosis present

## 2017-12-26 DIAGNOSIS — Z823 Family history of stroke: Secondary | ICD-10-CM

## 2017-12-26 DIAGNOSIS — J9601 Acute respiratory failure with hypoxia: Secondary | ICD-10-CM

## 2017-12-26 DIAGNOSIS — E440 Moderate protein-calorie malnutrition: Secondary | ICD-10-CM | POA: Diagnosis present

## 2017-12-26 DIAGNOSIS — I959 Hypotension, unspecified: Secondary | ICD-10-CM | POA: Diagnosis present

## 2017-12-26 DIAGNOSIS — J9690 Respiratory failure, unspecified, unspecified whether with hypoxia or hypercapnia: Secondary | ICD-10-CM | POA: Diagnosis present

## 2017-12-26 LAB — BLOOD GAS, ARTERIAL
ACID-BASE DEFICIT: 4.5 mmol/L — AB (ref 0.0–2.0)
ACID-BASE DEFICIT: 4.7 mmol/L — AB (ref 0.0–2.0)
Bicarbonate: 20 mmol/L (ref 20.0–28.0)
Bicarbonate: 20.5 mmol/L (ref 20.0–28.0)
Drawn by: 295031
Drawn by: 225631
FIO2: 100
FIO2: 80
O2 SAT: 98.2 %
O2 Saturation: 97.8 %
PCO2 ART: 36.7 mmHg (ref 32.0–48.0)
PEEP/CPAP: 10 cmH2O
PH ART: 7.309 — AB (ref 7.350–7.450)
PO2 ART: 117 mmHg — AB (ref 83.0–108.0)
Patient temperature: 98.6
Patient temperature: 100.3
RATE: 20 resp/min
VT: 0.5 mL
pCO2 arterial: 42.6 mmHg (ref 32.0–48.0)
pH, Arterial: 7.356 (ref 7.350–7.450)
pO2, Arterial: 110 mmHg — ABNORMAL HIGH (ref 83.0–108.0)

## 2017-12-26 LAB — COMPREHENSIVE METABOLIC PANEL
ALT: 19 U/L (ref 17–63)
AST: 38 U/L (ref 15–41)
Albumin: 2.8 g/dL — ABNORMAL LOW (ref 3.5–5.0)
Alkaline Phosphatase: 690 U/L — ABNORMAL HIGH (ref 38–126)
Anion gap: 11 (ref 5–15)
BUN: 11 mg/dL (ref 6–20)
CHLORIDE: 104 mmol/L (ref 101–111)
CO2: 20 mmol/L — ABNORMAL LOW (ref 22–32)
Calcium: 8.4 mg/dL — ABNORMAL LOW (ref 8.9–10.3)
Creatinine, Ser: 1.12 mg/dL (ref 0.61–1.24)
Glucose, Bld: 121 mg/dL — ABNORMAL HIGH (ref 65–99)
Potassium: 4.1 mmol/L (ref 3.5–5.1)
Sodium: 135 mmol/L (ref 135–145)
Total Bilirubin: 1.4 mg/dL — ABNORMAL HIGH (ref 0.3–1.2)
Total Protein: 6.6 g/dL (ref 6.5–8.1)

## 2017-12-26 LAB — CBC WITH DIFFERENTIAL/PLATELET
BASOS ABS: 0 10*3/uL (ref 0.0–0.1)
Basophils Relative: 1 %
EOS PCT: 1 %
Eosinophils Absolute: 0.1 10*3/uL (ref 0.0–0.7)
HCT: 29.3 % — ABNORMAL LOW (ref 39.0–52.0)
Hemoglobin: 9.3 g/dL — ABNORMAL LOW (ref 13.0–17.0)
LYMPHS PCT: 25 %
Lymphs Abs: 1.9 10*3/uL (ref 0.7–4.0)
MCH: 30.2 pg (ref 26.0–34.0)
MCHC: 31.7 g/dL (ref 30.0–36.0)
MCV: 95.1 fL (ref 78.0–100.0)
MONO ABS: 0.7 10*3/uL (ref 0.1–1.0)
Monocytes Relative: 9 %
Neutro Abs: 4.8 10*3/uL (ref 1.7–7.7)
Neutrophils Relative %: 64 %
PLATELETS: 112 10*3/uL — AB (ref 150–400)
RBC: 3.08 MIL/uL — ABNORMAL LOW (ref 4.22–5.81)
RDW: 17.2 % — ABNORMAL HIGH (ref 11.5–15.5)
WBC: 7.4 10*3/uL (ref 4.0–10.5)

## 2017-12-26 LAB — GLUCOSE, CAPILLARY
Glucose-Capillary: 103 mg/dL — ABNORMAL HIGH (ref 65–99)
Glucose-Capillary: 108 mg/dL — ABNORMAL HIGH (ref 65–99)

## 2017-12-26 LAB — BRAIN NATRIURETIC PEPTIDE: B NATRIURETIC PEPTIDE 5: 65.8 pg/mL (ref 0.0–100.0)

## 2017-12-26 LAB — I-STAT TROPONIN, ED: TROPONIN I, POC: 0.05 ng/mL (ref 0.00–0.08)

## 2017-12-26 LAB — I-STAT CG4 LACTIC ACID, ED: LACTIC ACID, VENOUS: 1.62 mmol/L (ref 0.5–1.9)

## 2017-12-26 MED ORDER — VANCOMYCIN HCL 10 G IV SOLR
2000.0000 mg | INTRAVENOUS | Status: AC
  Administered 2017-12-26: 2000 mg via INTRAVENOUS
  Filled 2017-12-26: qty 2000

## 2017-12-26 MED ORDER — IOPAMIDOL (ISOVUE-370) INJECTION 76%
INTRAVENOUS | Status: AC
  Filled 2017-12-26: qty 100

## 2017-12-26 MED ORDER — ETOMIDATE 2 MG/ML IV SOLN
0.3000 mg/kg | Freq: Once | INTRAVENOUS | Status: AC
  Administered 2017-12-26: 20 mg via INTRAVENOUS

## 2017-12-26 MED ORDER — SODIUM CHLORIDE 0.9 % IV SOLN
1.0000 g | Freq: Three times a day (TID) | INTRAVENOUS | Status: DC
  Administered 2017-12-26 – 2017-12-29 (×9): 1 g via INTRAVENOUS
  Filled 2017-12-26 (×10): qty 1

## 2017-12-26 MED ORDER — AZITHROMYCIN 250 MG PO TABS
500.0000 mg | ORAL_TABLET | Freq: Once | ORAL | Status: AC
  Administered 2017-12-26: 500 mg via ORAL
  Filled 2017-12-26: qty 2

## 2017-12-26 MED ORDER — VANCOMYCIN HCL IN DEXTROSE 1-5 GM/200ML-% IV SOLN
1000.0000 mg | Freq: Two times a day (BID) | INTRAVENOUS | Status: DC
  Administered 2017-12-27 – 2017-12-29 (×5): 1000 mg via INTRAVENOUS
  Filled 2017-12-26 (×4): qty 200

## 2017-12-26 MED ORDER — ENOXAPARIN SODIUM 40 MG/0.4ML ~~LOC~~ SOLN: 40.0000 mg | SUBCUTANEOUS | Status: DC

## 2017-12-26 MED ORDER — MIDAZOLAM HCL 2 MG/2ML IJ SOLN
2.0000 mg | INTRAMUSCULAR | Status: DC | PRN
  Administered 2017-12-26 – 2017-12-29 (×11): 2 mg via INTRAVENOUS
  Filled 2017-12-26 (×12): qty 2

## 2017-12-26 MED ORDER — MIDAZOLAM HCL 2 MG/2ML IJ SOLN
INTRAMUSCULAR | Status: AC
  Administered 2017-12-26: 4 mg
  Filled 2017-12-26: qty 4

## 2017-12-26 MED ORDER — FENTANYL BOLUS VIA INFUSION
50.0000 ug | INTRAVENOUS | Status: DC | PRN
  Administered 2017-12-26 – 2017-12-29 (×6): 50 ug via INTRAVENOUS
  Filled 2017-12-26: qty 50

## 2017-12-26 MED ORDER — ENOXAPARIN SODIUM 150 MG/ML ~~LOC~~ SOLN
140.0000 mg | SUBCUTANEOUS | Status: DC
  Administered 2017-12-26: 140 mg via SUBCUTANEOUS
  Filled 2017-12-26: qty 0.93

## 2017-12-26 MED ORDER — MIDAZOLAM HCL 2 MG/2ML IJ SOLN
2.0000 mg | INTRAMUSCULAR | Status: AC | PRN
  Administered 2017-12-26 – 2017-12-27 (×3): 2 mg via INTRAVENOUS
  Filled 2017-12-26 (×3): qty 2

## 2017-12-26 MED ORDER — IPRATROPIUM-ALBUTEROL 0.5-2.5 (3) MG/3ML IN SOLN
3.0000 mL | Freq: Four times a day (QID) | RESPIRATORY_TRACT | Status: DC
  Administered 2017-12-26 – 2017-12-29 (×11): 3 mL via RESPIRATORY_TRACT
  Filled 2017-12-26 (×11): qty 3

## 2017-12-26 MED ORDER — FENTANYL CITRATE (PF) 100 MCG/2ML IJ SOLN: 100.0000 ug | Freq: Once | INTRAMUSCULAR | Status: DC

## 2017-12-26 MED ORDER — BISACODYL 10 MG RE SUPP: 10.0000 mg | Freq: Every day | RECTAL | Status: DC | PRN

## 2017-12-26 MED ORDER — SODIUM CHLORIDE 0.9 % IV SOLN
25.0000 ug/h | INTRAVENOUS | Status: DC
  Filled 2017-12-26: qty 50

## 2017-12-26 MED ORDER — MIDAZOLAM HCL 5 MG/ML IJ SOLN: 2.0000 mg | Freq: Once | INTRAMUSCULAR | Status: DC

## 2017-12-26 MED ORDER — SODIUM CHLORIDE 0.9% FLUSH
10.0000 mL | Freq: Two times a day (BID) | INTRAVENOUS | Status: DC
  Administered 2017-12-27: 10 mL
  Administered 2017-12-27: 30 mL
  Administered 2017-12-28: 10 mL
  Administered 2017-12-28: 30 mL
  Administered 2017-12-28: 10 mL
  Administered 2017-12-29: 20 mL

## 2017-12-26 MED ORDER — SODIUM CHLORIDE 0.9 % IV SOLN
INTRAVENOUS | Status: DC
  Administered 2017-12-26 – 2017-12-28 (×3): via INTRAVENOUS
  Administered 2017-12-29: 50 mL/h via INTRAVENOUS

## 2017-12-26 MED ORDER — SODIUM CHLORIDE 0.9 % IV SOLN
250.0000 mL | INTRAVENOUS | Status: DC | PRN
  Administered 2017-12-28: 250 mL via INTRAVENOUS

## 2017-12-26 MED ORDER — ONDANSETRON HCL 4 MG/2ML IJ SOLN: 4.0000 mg | Freq: Four times a day (QID) | INTRAMUSCULAR | Status: DC | PRN

## 2017-12-26 MED ORDER — IOPAMIDOL (ISOVUE-370) INJECTION 76%
100.0000 mL | Freq: Once | INTRAVENOUS | Status: AC | PRN
  Administered 2017-12-26: 55 mL via INTRAVENOUS

## 2017-12-26 MED ORDER — SODIUM CHLORIDE 0.9 % IV BOLUS (SEPSIS)
1000.0000 mL | Freq: Once | INTRAVENOUS | Status: AC
  Administered 2017-12-26: 1000 mL via INTRAVENOUS

## 2017-12-26 MED ORDER — METHYLPREDNISOLONE SODIUM SUCC 125 MG IJ SOLR
125.0000 mg | Freq: Once | INTRAMUSCULAR | Status: DC
  Filled 2017-12-26: qty 2

## 2017-12-26 MED ORDER — SODIUM CHLORIDE 0.9 % IV BOLUS (SEPSIS): 1000.0000 mL | Freq: Once | INTRAVENOUS | Status: DC

## 2017-12-26 MED ORDER — CHLORHEXIDINE GLUCONATE CLOTH 2 % EX PADS: 6.0000 | MEDICATED_PAD | Freq: Every day | CUTANEOUS | Status: DC

## 2017-12-26 MED ORDER — PHENYLEPHRINE 40 MCG/ML (10ML) SYRINGE FOR IV PUSH (FOR BLOOD PRESSURE SUPPORT)
400.0000 ug | PREFILLED_SYRINGE | Freq: Once | INTRAVENOUS | Status: AC
  Administered 2017-12-26: 40 ug via INTRAVENOUS
  Filled 2017-12-26: qty 10

## 2017-12-26 MED ORDER — FENTANYL CITRATE (PF) 100 MCG/2ML IJ SOLN: 50.0000 ug | Freq: Once | INTRAMUSCULAR | Status: DC

## 2017-12-26 MED ORDER — ENSURE ENLIVE PO LIQD
237.0000 mL | Freq: Two times a day (BID) | ORAL | Status: DC
  Administered 2017-12-26: 237 mL via ORAL
  Filled 2017-12-26: qty 237

## 2017-12-26 MED ORDER — ENOXAPARIN SODIUM 150 MG/ML ~~LOC~~ SOLN: 135.0000 mg | SUBCUTANEOUS | Status: DC

## 2017-12-26 MED ORDER — METHYLPREDNISOLONE SODIUM SUCC 125 MG IJ SOLR
60.0000 mg | Freq: Three times a day (TID) | INTRAMUSCULAR | Status: DC
  Administered 2017-12-27 – 2017-12-29 (×8): 60 mg via INTRAVENOUS
  Filled 2017-12-26 (×8): qty 2

## 2017-12-26 MED ORDER — ENOXAPARIN SODIUM 150 MG/ML ~~LOC~~ SOLN
1.5000 mg/kg | SUBCUTANEOUS | Status: DC
  Administered 2017-12-28 (×2): 130 mg via SUBCUTANEOUS
  Filled 2017-12-26 (×2): qty 0.87

## 2017-12-26 MED ORDER — PHENYLEPHRINE 40 MCG/ML (10ML) SYRINGE FOR IV PUSH (FOR BLOOD PRESSURE SUPPORT)
200.0000 ug | PREFILLED_SYRINGE | Freq: Once | INTRAVENOUS | Status: DC
  Filled 2017-12-26: qty 5

## 2017-12-26 MED ORDER — FENTANYL CITRATE (PF) 100 MCG/2ML IJ SOLN
INTRAMUSCULAR | Status: AC
  Administered 2017-12-26: 100 ug
  Filled 2017-12-26: qty 2

## 2017-12-26 MED ORDER — ROCURONIUM BROMIDE 50 MG/5ML IV SOLN
0.6000 mg/kg | Freq: Once | INTRAVENOUS | Status: AC
  Administered 2017-12-26: 5 mg via INTRAVENOUS
  Filled 2017-12-26: qty 5.32

## 2017-12-26 MED ORDER — FENTANYL 2500MCG IN NS 250ML (10MCG/ML) PREMIX INFUSION
25.0000 ug/h | INTRAVENOUS | Status: DC
  Administered 2017-12-26: 50 ug/h via INTRAVENOUS
  Administered 2017-12-27 (×2): 300 ug/h via INTRAVENOUS
  Administered 2017-12-27: 275 ug/h via INTRAVENOUS
  Administered 2017-12-27: 325 ug/h via INTRAVENOUS
  Administered 2017-12-28 (×2): 400 ug/h via INTRAVENOUS
  Administered 2017-12-28: 350 ug/h via INTRAVENOUS
  Administered 2017-12-28: 400 ug/h via INTRAVENOUS
  Filled 2017-12-26 (×9): qty 250

## 2017-12-26 MED ORDER — ACETAMINOPHEN 650 MG RE SUPP
650.0000 mg | Freq: Four times a day (QID) | RECTAL | Status: DC | PRN
  Filled 2017-12-26: qty 1

## 2017-12-26 MED ORDER — SODIUM CHLORIDE 0.9% FLUSH: 10.0000 mL | INTRAVENOUS | Status: DC | PRN

## 2017-12-26 MED ORDER — FAMOTIDINE 20 MG PO TABS
20.0000 mg | ORAL_TABLET | Freq: Two times a day (BID) | ORAL | Status: DC
  Administered 2017-12-27 – 2017-12-29 (×5): 20 mg via ORAL
  Filled 2017-12-26 (×5): qty 1

## 2017-12-26 MED ORDER — ACETAMINOPHEN 325 MG PO TABS
650.0000 mg | ORAL_TABLET | Freq: Four times a day (QID) | ORAL | Status: DC | PRN
  Administered 2017-12-27: 650 mg via ORAL
  Filled 2017-12-26: qty 2

## 2017-12-26 MED ORDER — OXYCODONE-ACETAMINOPHEN 5-325 MG PO TABS
2.0000 | ORAL_TABLET | Freq: Four times a day (QID) | ORAL | Status: DC | PRN
  Filled 2017-12-26 (×2): qty 2

## 2017-12-26 MED ORDER — CEFTRIAXONE SODIUM 1 G IJ SOLR
1.0000 g | Freq: Once | INTRAMUSCULAR | Status: AC
  Administered 2017-12-26: 1 g via INTRAVENOUS
  Filled 2017-12-26: qty 10

## 2017-12-26 MED ORDER — ALBUTEROL SULFATE (2.5 MG/3ML) 0.083% IN NEBU: 2.5000 mg | INHALATION_SOLUTION | RESPIRATORY_TRACT | Status: DC | PRN

## 2017-12-26 MED ORDER — SENNOSIDES 8.8 MG/5ML PO SYRP
5.0000 mL | ORAL_SOLUTION | Freq: Two times a day (BID) | ORAL | Status: DC | PRN
  Filled 2017-12-26: qty 5

## 2017-12-26 NOTE — Progress Notes (Signed)
Pharmacy Antibiotic Note  Oscar Floyd is a 52 y.o. male admitted on 12/31/2017 with pneumonia.  PMH significant for HTN, PE (10/2017), metastatic lung cancer, with recent change in treatment.  Comes to ED with c/o worsening shortness of breath.  In the ED, patient received Ceftriaxone 1gm IV and Azithromycin 500mg  PO x 1 dose each.  Upon admission, MD has ordered Cefepime and pharmacy has been consulted for Vancomycin dosing.  Plan:  Vancomycin 2000mg  IV loading dose x 1 followed by 1gm IV q12h (Goal AUC 400-500)  Cefepime per MD  Follow cultures & sensitivities, renal function  Monitor vancomycin levels as appropriate    Temp (24hrs), Avg:98.1 F (36.7 C), Min:98.1 F (36.7 C), Max:98.1 F (36.7 C)  Recent Labs  Lab 12/20/2017 1040 12/17/2017 1224  WBC 7.4  --   CREATININE 1.12  --   LATICACIDVEN  --  1.62    Estimated Creatinine Clearance: 91.7 mL/min (by C-G formula based on SCr of 1.12 mg/dL).    No Known Allergies  Antimicrobials this admission: 4/15 Azith x 1  4/15 Ceftriaxone x 1  4/15 Cefepime >> 4/15 Vanc >>  Dose adjustments this admission:    Microbiology results: 4/15 BCx: sent 4/15 Sputum: sent   Thank you for allowing pharmacy to be a part of this patient's care.  Everette Rank, PharmD 12/25/2017 3:15 PM

## 2017-12-26 NOTE — ED Notes (Signed)
Bed: WA15 Expected date:  Expected time:  Means of arrival:  Comments: EMS-SOB 

## 2017-12-26 NOTE — ED Notes (Signed)
Patient transported to CT 

## 2017-12-26 NOTE — Progress Notes (Signed)
Oncology Nurse Navigator Documentation  Oncology Nurse Navigator Flowsheets 12/30/2017  Navigator Location [facility]  Navigator Encounter Type Other/patient's wife called and state patient is not feeling well and feels he needs to go to ED. I listened as she explained. I asked that she call 911 and get him to the hospital.  She did. I went to see them in the ED.  Patient is on NRB mask, resp rate in high 30's and lower 40's sats in the mid to lower 90's.  BP is hypotensive and will start IV fluids. Patient states that after he started on new medication he developed a cough and fatigue.    Patient Visit Type Inpatient  Treatment Phase Treatment  Barriers/Navigation Needs Coordination of Care;Education  Education Other  Interventions Coordination of Care;Education  Coordination of Care Other  Education Method Verbal  Acuity Level 2  Time Spent with Patient 46

## 2017-12-26 NOTE — H&P (Signed)
History and Physical    Oscar Floyd KNL:[id number] DOB: 08-Dec-1965 DOA: 12/15/2017  PCP: [name], MD  Patient coming from: home  I have personally briefly reviewed patient's old medical records in [place]  Chief Complaint: SOB  HPI: Oscar Floyd is Oscar Floyd 52 y.o. male with medical history significant of [name], hypertension presenting with worsening shortness of breath with imaging findings concerning for pneumonia versus drug-induced pneumonitis versus progression of his malignancy.   Patient was diagnosed with lung cancer in July 2015.  He was recently discharged after treatment for pulmonary embolism.  He notes that after discharge from that hospitalization he felt that he was slowly improving and got the point where he was able to do stairs.  This changed after he took his brig'satinib on Saturday.  After that he noticed rapidly progressive shortness of breath.  He denies any fevers, chills, chest pain, abdominal pain, nausea, vomiting, lower extremity edema.  He notes that cough over the past 2-3 weeks.    ED Course: In the emergency department he had labs, imaging, IV antibiotics.  Oncology was consulted.  Hospitalist consulted for admission.  Review of Systems: As per HPI otherwise 10 point review of systems negative.   Past Medical History:  Diagnosis Date  . Bone metastases ([place]) 08/26/2015  . Encounter for antineoplastic chemotherapy 01/06/2016  . Family history of cancer   . Hypertension   . Lung cancer ([place])    RUL lung with mets to liver, bone and brain    Past Surgical History:  Procedure Laterality Date  . IR FLUORO GUIDE PORT INSERTION RIGHT  10/19/2017  . IR US GUIDE VASC ACCESS RIGHT  10/19/2017  . VASECTOMY    . VIDEO BRONCHOSCOPY Bilateral 03/20/2014   Procedure: VIDEO BRONCHOSCOPY WITH FLUORO;  Surgeon: Rigoberto Noel, MD;  Location: [facility];  Service: Cardiopulmonary;  Laterality: Bilateral;  . VIDEO BRONCHOSCOPY Bilateral  07/20/2017   Procedure: VIDEO BRONCHOSCOPY WITH FLUORO;  Surgeon: Rigoberto Noel, MD;  Location: [facility];  Service: Cardiopulmonary;  Laterality: Bilateral;     reports that he has never smoked. He has never used smokeless tobacco. He reports that he drinks alcohol. He reports that he does not use drugs.  No Known Allergies  Family History  Problem Relation Age of Onset  . Cancer Father 4       liposarcoma age 29  . Cancer Mother 28       gist  . Cancer Paternal Grandfather        lung cancer; heavy smoker; also had abdominal cancer in 55's  . Stroke Maternal Grandmother   . Heart disease Maternal Grandfather   . Cancer Other        multiple brothers of PGF with unknown cancer   Prior to Admission medications   Medication Sig Start Date End Date Taking? Authorizing Provider  albuterol (PROVENTIL) (2.5 MG/3ML) 0.083% nebulizer solution Take 3 mLs (2.5 mg total) by nebulization every 6 (six) hours as needed for wheezing or shortness of breath. Dx: J45.31 10/28/17  Yes Parrett, Fonnie Mu, NP  ALPRAZolam (XANAX) 0.5 MG tablet Take 1 tablet (0.5 mg total) by mouth at bedtime as needed. Take 1-2 tablets po at night for sleep 12/13/17  Yes Bruning, Ashlyn, PA-C  Brigatinib (ALUNBRIG) 90 & 180 MG TBPK Take 90 mg by mouth daily. Take for 7 days. If tolerated, increase dose to 162m once daily. 12/20/17  Yes MCurt Bears MD  budesonide-formoterol ([facility] 80-4.5  MCG/ACT inhaler Inhale 2 puffs into the lungs 2 (two) times daily. 10/28/17  Yes Parrett, Tammy S, NP  diazepam (VALIUM) 10 MG tablet Take 1 tab po 30 minutes prior to brain MRI 12/06/17  Yes Hayden Pedro, PA-C  docusate sodium (COLACE) 100 MG capsule Take 100 mg by mouth daily.   Yes [provider]  enoxaparin (LOVENOX) 150 MG/ML injection Inject 0.9 mLs (135 mg total) into the skin daily. 12/15/17  Yes [name], MD  esomeprazole (NEXIUM) 40 MG capsule Take 1 capsule (40 mg total) by mouth daily at 12 noon.  10/07/17  Yes Tanner, Lyndon Code., PA-C  HYDROcodone-acetaminophen (NORCO/VICODIN) 5-325 MG tablet Take 1 tablet by mouth every 6 (six) hours as needed for moderate pain. 12/13/17  Yes Bruning, Ashlyn, PA-C  osimertinib mesylate (TAGRISSO) 80 MG tablet Take 80 mg by mouth daily.   Yes [provider]  albuterol (PROVENTIL HFA;VENTOLIN HFA) 108 (90 Base) MCG/ACT inhaler Inhale 2 puffs into the lungs every 6 (six) hours as needed for wheezing or shortness of breath. Patient not taking: Reported on 12/13/2017 08/31/17   Sandi Mealy E., PA-C  ipratropium-albuterol (DUONEB) 0.5-2.5 (3) MG/3ML SOLN Take 3 mLs by nebulization 3 (three) times daily. Patient not taking: Reported on 12/13/2017 11/13/17   Raiford Noble Latif, DO  protein supplement shake (PREMIER PROTEIN) LIQD Take 325 mLs (11 oz total) by mouth daily. 11/13/17   Raiford Noble [place], DO    Physical Exam: Vitals:   12/19/2017 1600 12/25/2017 1632 12/31/2017 1638 01/03/2018 1645  BP: (!) 138/125     Pulse: (!) 109 (!) 111    Resp: 20     Temp:   (!) 101.3 F (38.5 C)   TempSrc:   Axillary   SpO2: 100% 100%    Weight:    87.1 kg (192 lb 0.3 oz)  Height:    6' 2" (1.88 m)    Constitutional: NAD, increased WOB, appears uncomfortable Vitals:   12/16/2017 1600 12/19/2017 1632 12/20/2017 1638 12/25/2017 1645  BP: (!) 138/125     Pulse: (!) 109 (!) 111    Resp: 20     Temp:   (!) 101.3 F (38.5 C)   TempSrc:   Axillary   SpO2: 100% 100%    Weight:    87.1 kg (192 lb 0.3 oz)  Height:    6' 2" (1.88 m)   Eyes: PERRL, lids and conjunctivae normal ENMT: Mucous membranes are moist. Posterior pharynx clear of any exudate or lesions.Normal dentition.  Neck: normal, supple, no masses, no thyromegaly Respiratory: increased work of breathing, accessory muscle use.  On nonrebreather.  Equal lung sounds. Cardiovascular: Regular rate and rhythm, no murmurs / rubs / gallops. No extremity edema. 2+ pedal pulses. No carotid bruits.  Abdomen: no tenderness, no masses  palpated. No hepatosplenomegaly. Bowel sounds positive.  Musculoskeletal: no clubbing / cyanosis. No joint deformity upper and lower extremities. Good ROM, no contractures. Normal muscle tone.  Skin: no rashes, lesions, ulcers. No induration Neurologic: CN 2-12 grossly intact. Sensation intact. Strength 5/5 in all 4.  Psychiatric: Normal judgment and insight. Alert and oriented x 3. Normal mood.   Labs on Admission: I have personally reviewed following labs and imaging studies  CBC: Recent Labs  Lab 01/03/2018 1040  WBC 7.4  NEUTROABS 4.8  HGB 9.3*  HCT 29.3*  MCV 95.1  PLT 824*   Basic Metabolic Panel: Recent Labs  Lab 12/30/2017 1040  NA 135  K 4.1  CL 104  CO2 20*  GLUCOSE 121*  BUN 11  CREATININE 1.12  CALCIUM 8.4*   GFR: Estimated Creatinine Clearance: 91.7 mL/min (by C-G formula based on SCr of 1.12 mg/dL). Liver Function Tests: Recent Labs  Lab 01/07/2018 1040  AST 38  ALT 19  ALKPHOS 690*  BILITOT 1.4*  PROT 6.6  ALBUMIN 2.8*   No results for input(s): LIPASE, AMYLASE in the last 168 hours. No results for input(s): AMMONIA in the last 168 hours. Coagulation Profile: No results for input(s): INR, PROTIME in the last 168 hours. Cardiac Enzymes: No results for input(s): CKTOTAL, CKMB, CKMBINDEX, TROPONINI in the last 168 hours. BNP (last 3 results) No results for input(s): PROBNP in the last 8760 hours. HbA1C: No results for input(s): HGBA1C in the last 72 hours. CBG: No results for input(s): GLUCAP in the last 168 hours. Lipid Profile: No results for input(s): CHOL, HDL, LDLCALC, TRIG, CHOLHDL, LDLDIRECT in the last 72 hours. Thyroid Function Tests: No results for input(s): TSH, T4TOTAL, FREET4, T3FREE, THYROIDAB in the last 72 hours. Anemia Panel: No results for input(s): VITAMINB12, FOLATE, FERRITIN, TIBC, IRON, RETICCTPCT in the last 72 hours. Urine analysis: No results found for: COLORURINE, APPEARANCEUR, LABSPEC, PHURINE, GLUCOSEU, HGBUR,  BILIRUBINUR, KETONESUR, PROTEINUR, UROBILINOGEN, NITRITE, LEUKOCYTESUR  Radiological Exams on Admission: Dg Chest 2 View  Result Date: 12/24/2017 CLINICAL DATA:  Shortness of breath.  History of lung carcinoma EXAM: CHEST - 2 VIEW COMPARISON:  November 13, 2017 chest radiograph and chest CT November 01, 2017 FINDINGS: Port-Sergei Delo-Cath tip is at the cavoatrial junction.  No pneumothorax. There is widespread interstitial and alveolar consolidation throughout the lungs bilaterally. Heart size is upper normal with pulmonary vascularity within normal limits. No evident adenopathy. 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 IMPRESSION: Widespread interstitial and alveolar consolidation. There may well be underlying mass in the left lower lobe. Nodular lesions seen on CT in the lungs are obscured by the diffuse interstitial and alveolar opacity. Differential considerations for the widespread opacity include lymphangitic spread of tumor, atypical infection, ARDS, and allergic type reaction. More than one of these entities may well be present concurrently. Sclerotic bony metastatic lesions throughout the thorax. Port-Genette Huertas-Cath tip at cavoatrial junction.  No pneumothorax evident. Electronically Signed   By: Lowella Grip III M.D.   On: 12/23/2017 10:55   Ct Angio Chest Pe W And/or Wo Contrast  Result Date: 12/25/2017 CLINICAL DATA:  52 year old male with stage IV lung cancer. Recent onset, progressive shortness of breath after beginning Josias Tomerlin new treatment (Brigatinib). EXAM: CT ANGIOGRAPHY CHEST WITH CONTRAST TECHNIQUE: Multidetector CT imaging of the chest was performed using the standard protocol during bolus administration of intravenous contrast. Multiplanar CT image reconstructions and MIPs were obtained to evaluate the vascular anatomy. CONTRAST:  74m ISOVUE-370 IOPAMIDOL (ISOVUE-370) INJECTION 76% COMPARISON:  Chest radiographs 1042 hours today and earlier, including CTA chest  11/01/2017 FINDINGS: Cardiovascular: Adequate contrast bolus timing in the pulmonary arterial tree. Intermittent motion artifact in both lungs. There is no central or saddle pulmonary embolus. About the hila, no definite pulmonary artery filling defect is identified, and the low-density thrombus seen at the right hilum in February is no longer evident. The distal branches are not well evaluated. Negative visible aorta. Stable cardiac size, no cardiomegaly or pleural effusion. No definite calcified coronary artery atherosclerosis, although there is pulsation and motion artifact. Stable right chest porta cath. Mediastinum/Nodes: No mediastinal lymphadenopathy. Lungs/Pleura: Worsening bilateral ground-glass and confluent pulmonary opacity since the February CTA. There is confluent peribronchial involvement  throughout both lungs. There is continued dense peribronchial consolidation in the left lower lobe. The major airways remain patent. Eurika Sandy small layering left pleural effusion is new since February. There is also new trace right pleural fluid. Upper Abdomen: Stable and negative visible upper abdomen. Musculoskeletal: Diffuse sclerotic bone metastases. No new osseous abnormality identified in the chest. Review of the MIP images confirms the above findings. IMPRESSION: 1. No central or hilar pulmonary embolus identified today. The distal branches are not well evaluated. 2. Unresolved, progressed, and now severe bilateral widespread airspace opacity since the February CTA. Diffuse, confluent pulmonary ground-glass opacity with some septal thickening, and continued dense left lower lobe peribronchial consolidation - such as due to Batina Dougan severe pneumonia. This is nonspecific, and bronchoscopy may be helpful if the patient is stable. On review of the literature there is Kysa Calais known severe lung toxicity to ALK Inhibitors such as Brigatinib, which can have Yazid Pop radiographic pattern of pneumonia, although the changes seen today began in  February. 3. New small layering pleural effusions greater on the left. 4. Diffuse osseous metastatic disease. Electronically Signed   By: Genevie Ann M.D.   On: 01/08/2018 14:45   Dg Chest Port 1 View  Result Date: 01/08/2018 CLINICAL DATA:  Lung cancer EXAM: PORTABLE CHEST 1 VIEW COMPARISON:  Earlier the same day FINDINGS: 1750 hours. Endotracheal tube tip is 4.2 cm above the base of the carina. Right Port-Osiah Haring-Cath tip overlies the distal SVC. Heart size stable. Diffuse interstitial and basilar airspace opacity again noted. NG tube tip overlies the mid stomach. The visualized bony structures of the thorax are intact. Telemetry leads overlie the chest. IMPRESSION: 1. Endotracheal tube tip 4.2 cm above the base the carina. 2. NG tube tip positioned in the mid stomach. 3. No substantial change cardiopulmonary exam. Electronically Signed   By: Misty Stanley M.D.   On: 01/09/2018 18:34    EKG: Independently reviewed. Sinus tach, appears similar to priors  Assessment/Plan Active Problems:   Pneumonia   Respiratory failure (HCC)  Acute Hypoxic Respiratory Failure: Infectious versus progressive cancer versus related to Alk inhibitor.  Patient is significantly hypoxic.  ABG with normal PCO2.  CT diffuse groundglass opacities.  Also small layering pleural effusion. Antibiotics, steroids Urine strep, sputum cx Follow blood cx Prn and scheduled nebs Pulmonology c/s with increased wob and high O2 requirement  Stage IV NSCLC: on tagrisso and received 1 dose of brigatinib.  Concern for drug induced pneumonitis from brigatinib.  Appreciate assistance from Dr. Mckinley Jewel.  Anemia  Throbocytopenia: anemia stable, platelets improving.  Follow  History of PE: lovenox  Anxiety  Insomnia: xanax qhs  Update.  Patient evaluated by pulmonary and intubated.    DVT prophylaxis: therapeutic lovenox Code Status: full code, discussed at bedside with pt and wife Family Communication: wife at bedside Disposition Plan:  pending improvement  Consults called: oncology, pulmonology  Admission status: stepdown    Fayrene Helper MD [facility] Pager [phone]  If 7PM-7AM, please contact night-coverage [email or web address] Password [facility]  01/07/2018, 6:54 PM

## 2017-12-26 NOTE — ED Notes (Signed)
Patient transported to X-ray 

## 2017-12-26 NOTE — Telephone Encounter (Signed)
Oral Oncology Patient Advocate Encounter  Received confirmation from [place] that patient's Alunbrig was delivered to his home on 12/24/2017.   Fabio Asa. Melynda Keller, [place] Patient [place] [phone] 12/21/2017 9:56 AM

## 2017-12-26 NOTE — ED Triage Notes (Signed)
Transported by [facility] from home--SHOB x 2-3 days. Hx of lung CA, no relief with home neb treatments. AAO x 4. With 8 L of oxygen [place] saturation 94%, prior on RA saturation 60% per EMS. Started new chemo pill 5 days ago.

## 2017-12-26 NOTE — ED Provider Notes (Addendum)
[facility] DEPT Provider Note   CSN: [id number] Arrival date & time: 12/13/2017  1002     History   Chief Complaint Chief Complaint  Patient presents with  . Shortness of Breath    HPI Oscar Floyd is a 52 y.o. male.  HPI Pt has history of stage 4 lung cancer.  Pt states his doctors changed  his regimen because the cancer was not responding.  Pt took this new treatment for the first time on satuday (alungbrig).  Since then he has been feeling short of breath.  He was told that could be a side effect.  Pt gets really short of breath just walking across the room.  No fevers.  No chest apin.  Non productive cough.  He has been able to take all his medication including his anticoagulat5ns.  He has been using his breathing treatments.  The sx are getting more and more severe.  He came in by EMS today.   Past Medical History:  Diagnosis Date  . Bone metastases ([place]) 08/26/2015  . Encounter for antineoplastic chemotherapy 01/06/2016  . Family history of cancer   . Hypertension   . Lung cancer ([place])    RUL lung with mets to liver, bone and brain    Patient Active Problem List   Diagnosis Date Noted  . Pulmonary embolism ([place]) 11/01/2017  . Dyspnea 10/28/2017  . Pneumonitis   . Community acquired pneumonia 07/14/2017  . Bronchitis 05/26/2017  . Sinusitis 12/21/2016  . Genetic testing 07/07/2016  . Family history of cancer   . Sinus congestion 05/24/2016  . Pharyngitis 01/13/2016  . Encounter for antineoplastic chemotherapy 01/06/2016  . Bone metastases ([place]) 08/26/2015  . Weight loss, abnormal 07/04/2014  . Drug-induced diarrhea 07/04/2014  . Drug-induced skin rash 07/04/2014  . Paronychia of finger 07/04/2014  . 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 Cough 03/11/2014  . Acid reflux 11/28/2013  . Blood pressure elevated 10/20/2012    Past Surgical History:  Procedure  Laterality Date  . IR FLUORO GUIDE PORT INSERTION RIGHT  10/19/2017  . IR US GUIDE VASC ACCESS RIGHT  10/19/2017  . VASECTOMY    . VIDEO BRONCHOSCOPY Bilateral 03/20/2014   Procedure: VIDEO BRONCHOSCOPY WITH FLUORO;  Surgeon: Rigoberto Noel, MD;  Location: [facility];  Service: Cardiopulmonary;  Laterality: Bilateral;  . VIDEO BRONCHOSCOPY Bilateral 07/20/2017   Procedure: VIDEO BRONCHOSCOPY WITH FLUORO;  Surgeon: Rigoberto Noel, MD;  Location: [facility];  Service: Cardiopulmonary;  Laterality: Bilateral;        Home Medications    Prior to Admission medications   Medication Sig Start Date End Date Taking? Authorizing Provider  albuterol (PROVENTIL) (2.5 MG/3ML) 0.083% nebulizer solution Take 3 mLs (2.5 mg total) by nebulization every 6 (six) hours as needed for wheezing or shortness of breath. Dx: J45.31 10/28/17  Yes Parrett, Fonnie Mu, NP  ALPRAZolam (XANAX) 0.5 MG tablet Take 1 tablet (0.5 mg total) by mouth at bedtime as needed. Take 1-2 tablets po at night for sleep 12/13/17  Yes Bruning, Ashlyn, PA-C  Brigatinib (ALUNBRIG) 90 & 180 MG TBPK Take 90 mg by mouth daily. Take for 7 days. If tolerated, increase dose to 180mg  once daily. 12/20/17  Yes [name], MD  budesonide-formoterol [facility]) 80-4.5 MCG/ACT inhaler Inhale 2 puffs into the lungs 2 (two) times daily. 10/28/17  Yes Parrett, Tammy S, NP  diazepam (VALIUM) 10 MG tablet Take 1 tab po  30 minutes prior to brain MRI 12/06/17  Yes Hayden Pedro, PA-C  docusate sodium (COLACE) 100 MG capsule Take 100 mg by mouth daily.   Yes [provider]  enoxaparin (LOVENOX) 150 MG/ML injection Inject 0.9 mLs (135 mg total) into the skin daily. 12/15/17  Yes [name], MD  esomeprazole (NEXIUM) 40 MG capsule Take 1 capsule (40 mg total) by mouth daily at 12 noon. 10/07/17  Yes Tanner, Lyndon Code., PA-C  HYDROcodone-acetaminophen (NORCO/VICODIN) 5-325 MG tablet Take 1 tablet by mouth every 6 (six) hours as needed for moderate  pain. 12/13/17  Yes Bruning, Ashlyn, PA-C  osimertinib mesylate (TAGRISSO) 80 MG tablet Take 80 mg by mouth daily.   Yes [provider]  albuterol (PROVENTIL HFA;VENTOLIN HFA) 108 (90 Base) MCG/ACT inhaler Inhale 2 puffs into the lungs every 6 (six) hours as needed for wheezing or shortness of breath. Patient not taking: Reported on 12/13/2017 08/31/17   Sandi Mealy E., PA-C  ipratropium-albuterol (DUONEB) 0.5-2.5 (3) MG/3ML SOLN Take 3 mLs by nebulization 3 (three) times daily. Patient not taking: Reported on 12/13/2017 11/13/17   Raiford Noble Latif, DO  protein supplement shake (PREMIER PROTEIN) LIQD Take 325 mLs (11 oz total) by mouth daily. 11/13/17   Kerney Elbe, DO    Family History Family History  Problem Relation Age of Onset  . Cancer Father 72       liposarcoma age 78  . Cancer Mother 46       gist  . Cancer Paternal Grandfather        lung cancer; heavy smoker; also had abdominal cancer in 48's  . Stroke Maternal Grandmother   . Heart disease Maternal Grandfather   . Cancer Other        multiple brothers of PGF with unknown cancer    Social History Social History   Tobacco Use  . Smoking status: Never Smoker  . Smokeless tobacco: Never Used  Substance Use Topics  . Alcohol use: Yes    Comment: socially  . Drug use: No     Allergies   Patient has no known allergies.   Review of Systems Review of Systems  All other systems reviewed and are negative.    Physical Exam Updated Vital Signs BP 108/79   Pulse (!) 102   Temp 98.1 F (36.7 C) (Axillary)   Resp (!) 34   SpO2 100%   Physical Exam  Constitutional: He appears ill. No distress.  HENT:  Head: Normocephalic and atraumatic.  Right Ear: External ear normal.  Left Ear: External ear normal.  Eyes: Conjunctivae are normal. Right eye exhibits no discharge. Left eye exhibits no discharge. No scleral icterus.  Neck: Neck supple. No tracheal deviation present.  Cardiovascular: Normal rate,  regular rhythm and intact distal pulses.  Pulmonary/Chest: Effort normal. No stridor. No respiratory distress. He has decreased breath sounds in the left lower field. He has no wheezes. He has no rales.  Abdominal: Soft. Bowel sounds are normal. He exhibits no distension. There is no tenderness. There is no rebound and no guarding.  Musculoskeletal: He exhibits no edema or tenderness.  Neurological: He is alert. He has normal strength. No cranial nerve deficit (no facial droop, extraocular movements intact, no slurred speech) or sensory deficit. He exhibits normal muscle tone. He displays no seizure activity. Coordination normal.  Skin: Skin is warm and dry. No rash noted. There is pallor.  Psychiatric: He has a normal mood and affect.  Nursing note and vitals  reviewed.    ED Treatments / Results  Labs (all labs ordered are listed, but only abnormal results are displayed) Labs Reviewed  CBC WITH DIFFERENTIAL/PLATELET - Abnormal; Notable for the following components:      Result Value   RBC 3.08 (*)    Hemoglobin 9.3 (*)    HCT 29.3 (*)    RDW 17.2 (*)    Platelets 112 (*)    All other components within normal limits  COMPREHENSIVE METABOLIC PANEL - Abnormal; Notable for the following components:   CO2 20 (*)    Glucose, Bld 121 (*)    Calcium 8.4 (*)    Albumin 2.8 (*)    Alkaline Phosphatase 690 (*)    Total Bilirubin 1.4 (*)    All other components within normal limits  CULTURE, BLOOD (ROUTINE X 2)  CULTURE, BLOOD (ROUTINE X 2)  BRAIN NATRIURETIC PEPTIDE  I-STAT TROPONIN, ED  I-STAT CG4 LACTIC ACID, ED  I-STAT CG4 LACTIC ACID, ED  I-STAT CG4 LACTIC ACID, ED    EKG EKG Interpretation  Date/Time:  Monday December 26 2017 10:58:02 EDT Ventricular Rate:  106 PR Interval:    QRS Duration: 88 QT Interval:  335 QTC Calculation: 445 R Axis:   11 Text Interpretation:  Sinus tachycardia Low voltage, precordial leads ST elevation, consider inferior injury Baseline wander in  lead(s) I III aVL No significant change since last tracing Confirmed by Dorie Rank [phone]) on 12/14/2017 11:13:03 AM   Radiology Dg Chest 2 View  Result Date: 01/05/2018 CLINICAL DATA:  Shortness of breath.  History of lung carcinoma EXAM: CHEST - 2 VIEW COMPARISON:  November 13, 2017 chest radiograph and chest CT November 01, 2017 FINDINGS: Port-A-Cath tip is at the cavoatrial junction.  No pneumothorax. There is widespread interstitial and alveolar consolidation throughout the lungs bilaterally. Heart size is upper normal with pulmonary vascularity within normal limits. No evident adenopathy. 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 IMPRESSION: Widespread interstitial and alveolar consolidation. There may well be underlying mass in the left lower lobe. Nodular lesions seen on CT in the lungs are obscured by the diffuse interstitial and alveolar opacity. Differential considerations for the widespread opacity include lymphangitic spread of tumor, atypical infection, ARDS, and allergic type reaction. More than one of these entities may well be present concurrently. Sclerotic bony metastatic lesions throughout the thorax. Port-A-Cath tip at cavoatrial junction.  No pneumothorax evident. Electronically Signed   By: Lowella Grip III M.D.   On: 12/12/2017 10:55    Procedures .Critical Care Performed by: Dorie Rank, MD Authorized by: Dorie Rank, MD   Critical care provider statement:    Critical care time (minutes):  35   Critical care was time spent personally by me on the following activities:  Discussions with consultants, evaluation of patient's response to treatment, examination of patient, ordering and performing treatments and interventions, ordering and review of laboratory studies, ordering and review of radiographic studies, pulse oximetry, re-evaluation of patient's condition, obtaining history from patient or surrogate and review of old charts    (including critical care time)  Medications Ordered in ED Medications  sodium chloride 0.9 % bolus 1,000 mL (1,000 mLs Intravenous New Bag/Given 01/01/2018 1210)    And  sodium chloride 0.9 % bolus 1,000 mL (has no administration in time range)    And  sodium chloride 0.9 % bolus 1,000 mL (has no administration in time range)  feeding supplement (ENSURE ENLIVE) (ENSURE ENLIVE) liquid 237 mL (has no administration  in time range)  cefTRIAXone (ROCEPHIN) 1 g in sodium chloride 0.9 % 100 mL IVPB (0 g Intravenous Stopped 12/12/2017 1240)  azithromycin (ZITHROMAX) tablet 500 mg (500 mg Oral Given 12/13/2017 1210)     Initial Impression / Assessment and Plan / ED Course  I have reviewed the triage vital signs and the nursing notes.  Pertinent labs & imaging results that were available during my care of the patient were reviewed by me and considered in my medical decision making (see chart for details).  Clinical Course as of Dec 27 1643  Mon Dec 26, 2017  1155 I reviewed the laboratory tests and x-ray findings with the patient.  Chest x-ray just the possibility of either lymphangitic spread or infection.  Patient's laboratory tests are otherwise unremarkable.  Patient blood pressure is now in the low 90s.  I am concerned about the possibility of sepsis.  Sepsis protocol initiated.  We will continue on his oxygen treatments.   [JK]  1321 BP has improved with fluids however pt remains tahcypneic.   As soon as he removes supplemental oxygen his o2 sat decreased.   Will try adding bipap.   No signs of heart failure.  Normal lactic acid level.  Concerned that this may be related to his CA   [JK]  1325 DG Chest 2 View [OM]  7672 Patient tried to eat something but when he took his oxygen off his O2 sats plummeted.  Patient requested some Ensure in the meantime   [JK]  1331 Discussed with Dr. Earlie Server.  He requests CT angio to help clarify the extent of his pulmonary disease.  He will evaluate the patient in  the hospital   [JK]    Clinical Course User Index [JK] Dorie Rank, MD    Patient presented to the emergency room for progressive shortness of breath.  Patient has an unfortunate history of metastatic lung cancer.  Patient presented to the ED with worsening shortness of breath.  His chest x-ray shows worsening infiltrates concerning for the possibility of infection versus progression of his cancer.  He initially was hypotensive but has responded for fluids.  Sepsis is concerned but he does not have a fever and there is no lactic acidosis.  Patient has been started on antibiotics for possible infection.  I am concerned that this may be related to his cancer.  I will consult with the oncology service.  I will consult with the medical service for admission.  Final Clinical Impressions(s) / ED Diagnoses   Final diagnoses:  Primary malignant neoplasm of lung metastatic to other site, unspecified laterality ([place])  Hypoxia       Dorie Rank, MD 12/14/2017 1329  Ed course updated   Dorie Rank, MD 12/13/2017 [phone]

## 2017-12-26 NOTE — Consult Note (Signed)
Name: Oscar Floyd MRN: [id number] DOB: Dec 28, 1965    ADMISSION DATE:  12/12/2017 CONSULTATION DATE:  12/25/2017  REFERRING MD :  Dr. 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 Julien Nordmann + Dr. Leda Gauze at [facility]) and PE on lovenox (10/2017) who presented to [facility] ER on 4/15 with reports of dyspnea.    The patient was last seen in the [place] office on 3/21 per Dr. Julien Nordmann.  At that time, he was in a wheelchair and reported SOB on exertion.  He was started on Tagrisso with the expectation that it be used in combination with Brigatinib when available.    He presented to [facility] ER on 4/15 via EMS with reports of 2-3 days of SOB.  He attempted nebulized breathing treatments without relief.  Upon EMS evaluation, he was found to be hypoxic in the 60's and was placed on 8L O2.  Per report the patient started his new therapy (Brigatinib) on Saturday 4/13.  After taking the medications, he noted more shortness of breath especially with exertion.  He has been compliant with all medications including anticoagulants.  Initial labs - Na 135, K 4.1, CO2 20, BUN 11/sr cr 1.12, alk phos 690, albumin 2.8, BNP 65, trop 0.05, lactic acid 1.62, WBC 7.4, Hgb 9.3 and platelets 112 (improved).  CTA of the chest completed which was negative for PE but showed progression of severe bilateral widespread airspace opacity since 10/2017.  Diffuse GGO with some septal thickening, continued dense LLL consolidation, new small layering effusion L>R, diffuse osseous metastatic disease.    PCCM consulted for pulmonary evaluation.    PAST MEDICAL HISTORY :   has a past medical history of Bone metastases ([place]) (08/26/2015), Encounter for antineoplastic chemotherapy (01/06/2016), Family history of cancer, Hypertension,  and Lung cancer ([place]).   has a past surgical history that includes Vasectomy; Video bronchoscopy (Bilateral, 03/20/2014); Video bronchoscopy (Bilateral, 07/20/2017); IR FLUORO GUIDE PORT INSERTION RIGHT (10/19/2017); and IR US Guide Vasc Access Right (10/19/2017).  Prior to Admission medications   Medication Sig Start Date End Date Taking? Authorizing Provider  albuterol (PROVENTIL) (2.5 MG/3ML) 0.083% nebulizer solution Take 3 mLs (2.5 mg total) by nebulization every 6 (six) hours as needed for wheezing or shortness of breath. Dx: J45.31 10/28/17  Yes Parrett, Fonnie Mu, NP  ALPRAZolam (XANAX) 0.5 MG tablet Take 1 tablet (0.5 mg total) by mouth at bedtime as needed. Take 1-2 tablets po at night for sleep 12/13/17  Yes Bruning, Ashlyn, PA-C  Brigatinib (ALUNBRIG) 90 & 180 MG TBPK Take 90 mg by mouth daily. Take for 7 days. If tolerated, increase dose to 14m once daily. 12/20/17  Yes MCurt Bears MD  budesonide-formoterol ([facility] 80-4.5 MCG/ACT inhaler Inhale 2 puffs into the lungs 2 (two) times daily. 10/28/17  Yes Parrett, Tammy S, NP  diazepam (VALIUM) 10 MG tablet Take 1 tab po 30 minutes prior to brain MRI 12/06/17  Yes PHayden Pedro PA-C  docusate sodium (COLACE) 100 MG capsule Take 100 mg by mouth daily.   Yes [provider]  enoxaparin (LOVENOX) 150 MG/ML injection Inject 0.9 mLs (135 mg total) into the skin daily. 12/15/17  Yes MCurt Bears MD  esomeprazole (NEXIUM) 40 MG capsule Take 1 capsule (40 mg total) by mouth daily at 12 noon.  10/07/17  Yes Tanner, Lyndon Code., PA-C  HYDROcodone-acetaminophen (NORCO/VICODIN) 5-325 MG tablet Take 1 tablet by mouth every 6 (six) hours as needed for moderate pain. 12/13/17  Yes Bruning, Ashlyn, PA-C  osimertinib mesylate (TAGRISSO) 80 MG tablet Take 80 mg by mouth daily.   Yes [provider]  albuterol (PROVENTIL HFA;VENTOLIN HFA) 108 (90 Base) MCG/ACT inhaler Inhale 2 puffs into the lungs every 6 (six) hours as needed for wheezing or  shortness of breath. Patient not taking: Reported on 12/13/2017 08/31/17   Sandi Mealy E., PA-C  ipratropium-albuterol (DUONEB) 0.5-2.5 (3) MG/3ML SOLN Take 3 mLs by nebulization 3 (three) times daily. Patient not taking: Reported on 12/13/2017 11/13/17   Raiford Noble Latif, DO  protein supplement shake (PREMIER PROTEIN) LIQD Take 325 mLs (11 oz total) by mouth daily. 11/13/17   Raiford Noble Latif, DO    No Known Allergies  FAMILY HISTORY:  family history includes Cancer in his other and paternal grandfather; Cancer (age of onset: 107) in his mother; Cancer (age of onset: 93) in his father; Heart disease in his maternal grandfather; Stroke in his maternal grandmother.  SOCIAL HISTORY:  reports that he has never smoked. He has never used smokeless tobacco. He reports that he drinks alcohol. He reports that he does not use drugs.  REVIEW OF SYSTEMS:  POSITIVES IN BOLD Constitutional: Negative for fever, chills, weight loss, malaise/fatigue and diaphoresis.  HENT: Negative for hearing loss, ear pain, nosebleeds, congestion, sore throat, neck pain, tinnitus and ear discharge.   Eyes: Negative for blurred vision, double vision, photophobia, pain, discharge and redness.  Respiratory: Negative for cough, hemoptysis, sputum production, shortness of breath, wheezing and stridor.   Cardiovascular: Negative for chest pain, palpitations, orthopnea, claudication, leg swelling and PND.  Gastrointestinal: Negative for heartburn, nausea, vomiting, abdominal pain, diarrhea, constipation, blood in stool and melena. Decreased appetite Genitourinary: Negative for dysuria, urgency, frequency, hematuria and flank pain.  Musculoskeletal: Negative for myalgias, back pain, joint pain and falls.  Skin: Negative for itching and rash.  Neurological: Negative for dizziness, tingling, tremors, sensory change, speech change, focal weakness, seizures, loss of consciousness, weakness and headaches.  Endo/Heme/Allergies: Negative  for environmental allergies and polydipsia. Does not bruise/bleed easily.   SUBJECTIVE:   VITAL SIGNS: Temp:  [98.1 F (36.7 C)] 98.1 F (36.7 C) (04/15 1020) Pulse Rate:  [101-115] 102 (04/15 1426) Resp:  [17-44] 20 (04/15 1426) BP: (93-131)/(49-96) 131/81 (04/15 1426) SpO2:  [64 %-100 %] 100 % (04/15 1426)  PHYSICAL EXAMINATION: General:  Chronically ill appearing male on bipap Neuro:  AAOx4, speech clear, MAE, generalized weakness  HEENT:  MM pink/dry, BiPAP mask in place Cardiovascular:  s1s2 rrr, no m/r/g  Lungs:  Tachypnea but not significantly labored, diminished breath sounds LLL Abdomen:  Soft/non-tender Musculoskeletal:  No acute deformities  Skin:  Warm/dry, pale  Recent Labs  Lab 12/22/2017 1040  NA 135  K 4.1  CL 104  CO2 20*  BUN 11  CREATININE 1.12  GLUCOSE 121*    Recent Labs  Lab 12/19/2017 1040  HGB 9.3*  HCT 29.3*  WBC 7.4  PLT 112*    Dg Chest 2 View  Result Date: 01/08/2018 CLINICAL DATA:  Shortness of breath.  History of lung carcinoma EXAM: CHEST - 2 VIEW COMPARISON:  November 13, 2017 chest radiograph and chest CT November 01, 2017 FINDINGS: Port-A-Cath tip is at the cavoatrial junction.  No pneumothorax. There is widespread interstitial and alveolar consolidation throughout the lungs bilaterally. Heart size is upper  normal with pulmonary vascularity within normal limits. No evident adenopathy. 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 IMPRESSION: Widespread interstitial and alveolar consolidation. There may well be underlying mass in the left lower lobe. Nodular lesions seen on CT in the lungs are obscured by the diffuse interstitial and alveolar opacity. Differential considerations for the widespread opacity include lymphangitic spread of tumor, atypical infection, ARDS, and allergic type reaction. More than one of these entities may well be present concurrently. Sclerotic bony metastatic lesions throughout the  thorax. Port-A-Cath tip at cavoatrial junction.  No pneumothorax evident. Electronically Signed   By: Lowella Grip III M.D.   On: 12/23/2017 10:55   Ct Angio Chest Pe W And/or Wo Contrast  Result Date: 12/12/2017 CLINICAL DATA:  52 year old male with stage IV lung cancer. Recent onset, progressive shortness of breath after beginning a new treatment (Brigatinib). EXAM: CT ANGIOGRAPHY CHEST WITH CONTRAST TECHNIQUE: Multidetector CT imaging of the chest was performed using the standard protocol during bolus administration of intravenous contrast. Multiplanar CT image reconstructions and MIPs were obtained to evaluate the vascular anatomy. CONTRAST:  62m ISOVUE-370 IOPAMIDOL (ISOVUE-370) INJECTION 76% COMPARISON:  Chest radiographs 1042 hours today and earlier, including CTA chest 11/01/2017 FINDINGS: Cardiovascular: Adequate contrast bolus timing in the pulmonary arterial tree. Intermittent motion artifact in both lungs. There is no central or saddle pulmonary embolus. About the hila, no definite pulmonary artery filling defect is identified, and the low-density thrombus seen at the right hilum in February is no longer evident. The distal branches are not well evaluated. Negative visible aorta. Stable cardiac size, no cardiomegaly or pleural effusion. No definite calcified coronary artery atherosclerosis, although there is pulsation and motion artifact. Stable right chest porta cath. Mediastinum/Nodes: No mediastinal lymphadenopathy. Lungs/Pleura: Worsening bilateral ground-glass and confluent pulmonary opacity since the February CTA. There is confluent peribronchial involvement throughout both lungs. There is continued dense peribronchial consolidation in the left lower lobe. The major airways remain patent. A small layering left pleural effusion is new since February. There is also new trace right pleural fluid. Upper Abdomen: Stable and negative visible upper abdomen. Musculoskeletal: Diffuse sclerotic  bone metastases. No new osseous abnormality identified in the chest. Review of the MIP images confirms the above findings. IMPRESSION: 1. No central or hilar pulmonary embolus identified today. The distal branches are not well evaluated. 2. Unresolved, progressed, and now severe bilateral widespread airspace opacity since the February CTA. Diffuse, confluent pulmonary ground-glass opacity with some septal thickening, and continued dense left lower lobe peribronchial consolidation - such as due to a severe pneumonia. This is nonspecific, and bronchoscopy may be helpful if the patient is stable. On review of the literature there is a known severe lung toxicity to ALK Inhibitors such as Brigatinib, which can have a radiographic pattern of pneumonia, although the changes seen today began in February. 3. New small layering pleural effusions greater on the left. 4. Diffuse osseous metastatic disease. Electronically Signed   By: HGenevie AnnM.D.   On: 12/17/2017 14:45      SIGNIFICANT EVENTS  4/15  Admit with SOB   STUDIES CTA Chest 4/15 >> neg for PE, progression of severe bilateral widespread airspace opacity since 10/2017.  Diffuse GGO with some septal thickening, continued dense LLL consolidation, new small layering effusion L>R, diffuse osseous metastatic disease  CULTURES BCx2 4/15 >>  Sputum 4/15 >>  ANTIBIOTICS  Vanco 4/15 >>  Cefepime 4/15 >>   ASSESSMENT / PLAN:  Discussion:  52y/o M with  Stage VI NSCLC c/w poorly differentiated adenocarcinoma with multiple positive EGFR mutations & PE on lovenox admitted with dyspnea.  Found to have acute hypoxic respiratory failure.  CTA of the chest negative for PE but does show new progression of severe bilateral widespread airspace opacity since 10/2017.  Diffuse GGO with some septal thickening, continued dense LLL consolidation, new small layering effusion L>R, diffuse osseous metastatic disease.   Recently started new kinase inhibitor (brigatinib) on 4/13.   Concern for possible drug induced pneumonitis, progression of underlying metastatic disease & infection.    Acute Hypoxic Respiratory Failure - in the setting of diffuse bilateral infiltrates P:  O2 to support saturations > 90% BiPAP PRN for increased WOB Pulmonary hygiene as able - IS, BIPAP support NPO   Severe Bilateral Airspace Opacities - ddx progression of malignancy, drug induced pneumonitis, and infection.   P: IV solumedrol 60 mg IV Q8.  Plan for ~ 48 hours on IV steroids, then consider transition to oral prednisone. Likely will need longer duration of therapy (3-4 weeks).   Continue empiric antibiotics  Follow cultures  Follow intermittent CXR   Immune Compromised / Stage IV NSCLC Deconditioning of Chronic Illness P: Dr. Julien Nordmann saw in ER.  Family states he indicated to call Palliative Care PT efforts as able  Nutritional supplementation when able to take PO's  Pulmonary Embolism - dx 10/2017, no new PE on admit imaging P: Continue Lovenox    GOC - discussed with wife at bedside.  She indicates she does not think he would want intubation.  Specifically, long term intubation.  Further discussion per Dr. Lynetta Mare.  Will involve palliative care in am.     Noe Gens, NP-C [facility] Pgr: [phone] or if no answer [phone] 12/25/2017, 3:33 PM

## 2017-12-26 NOTE — ED Notes (Signed)
ED Provider at bedside. 

## 2017-12-26 NOTE — Progress Notes (Signed)
DIAGNOSIS:  1) stage IV (T2a, N0, M1b) non-small cell lung cancer consistent with poorly differentiated adenocarcinoma with positive EGFR mutation with deletion in exon 19, diagnosed in July of 2015 and presented with right upper lobe lung mass in addition to extensive liver, brain and bone metastases. He had disease progression and development of EGFR T790M resistant mutation in April 2017. 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 2) acute pulmonary embolus diagnosed in February 2019  PRIOR THERAPY: 1) Status post whole brain irradiation under the care Dr. Tammi Klippel completed 04/12/2014. 2) Gilotrif 40 mg po daily - therapy beginning 04/03/2014. Status post approximately 20 months of therapy discontinued secondary to disease progression and development of EGFR T790M resistant mutation.  3) palliative radiotherapy to the lytic lesion in the right clavicle. 4) Tagrisso 80 mg by mouth daily status post 19 months of treatment. First dose was given on 01/12/2016.  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 5) systemic chemotherapy with carboplatin for AC of 5, Alimta 500 mg/M2 and Keytruda 200 mg IV every 3 weeks.  First dose September 09, 2017.  Status post 2 cycles.  CURRENT THERAPY: 1) Tagrisso 80 mg p.o. daily.  He also received 1 dose of brigatinib (Alunbrig) 90 mg p.o.  on 12/24/2017. 2) Lovenox 1.5 mg/KG on daily basis for the pulmonary embolism. 2) Xgeva 120 g subcutaneously on monthly basis.    Subjective: The patient is seen and examined today.  His wife was at the bedside.  The patient presented to the emergency department earlier today complaining of significant shortness of breath started 4 hours after taking his first dose of Brigatinib (Alungbrig) on Saturday, 12/24/2017.  His condition has been getting worse.  He did not take the medication yesterday or today.  His wife called  EMS and brought him to the emergency department at [facility].  He continues to have significant shortness of breath and drop in his oxygen saturation to the 70s with minimal exertion.  He had no fever or chills.  He continues to complain of low back pain.  Objective: Vital signs in last 24 hours: Temp:  [98.1 F (36.7 C)] 98.1 F (36.7 C) (04/15 1020) Pulse Rate:  [101-115] 102 (04/15 1426) Resp:  [17-44] 20 (04/15 1426) BP: (93-131)/(49-96) 131/81 (04/15 1426) SpO2:  [64 %-100 %] 100 % (04/15 1426)  Intake/Output from previous day: No intake/output data recorded. Intake/Output this shift: Total I/O In: 1100 [IV Piggyback:1100] Out: -   General appearance: alert, cooperative, flushed and moderate distress Resp: diminished breath sounds bilaterally and rales bilaterally Cardio: regular rate and rhythm, S1, S2 normal, no murmur, click, rub or gallop GI: soft, non-tender; bowel sounds normal; no masses,  no organomegaly Extremities: extremities normal, atraumatic, no cyanosis or edema  Lab Results:  Recent Labs    12/23/2017 1040  WBC 7.4  HGB 9.3*  HCT 29.3*  PLT 112*   BMET Recent Labs    12/16/2017 1040  NA 135  K 4.1  CL 104  CO2 20*  GLUCOSE 121*  BUN 11  CREATININE 1.12  CALCIUM 8.4*    Studies/Results: Dg Chest 2 View  Result Date: 12/21/2017 CLINICAL DATA:  Shortness of breath.  History of lung carcinoma EXAM: CHEST - 2 VIEW COMPARISON:  November 13, 2017 chest radiograph and chest CT November 01, 2017 FINDINGS: Port-A-Cath tip is at the cavoatrial junction.  No pneumothorax. There is widespread interstitial and alveolar  consolidation throughout the lungs bilaterally. Heart size is upper normal with pulmonary vascularity within normal limits. No evident adenopathy. 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 IMPRESSION: Widespread interstitial and alveolar consolidation. There may well be underlying mass in the left lower  lobe. Nodular lesions seen on CT in the lungs are obscured by the diffuse interstitial and alveolar opacity. Differential considerations for the widespread opacity include lymphangitic spread of tumor, atypical infection, ARDS, and allergic type reaction. More than one of these entities may well be present concurrently. Sclerotic bony metastatic lesions throughout the thorax. Port-A-Cath tip at cavoatrial junction.  No pneumothorax evident. Electronically Signed   By: Lowella Grip III M.D.   On: 12/30/2017 10:55   Ct Angio Chest Pe W And/or Wo Contrast  Result Date: 12/24/2017 CLINICAL DATA:  52 year old male with stage IV lung cancer. Recent onset, progressive shortness of breath after beginning a new treatment (Brigatinib). EXAM: CT ANGIOGRAPHY CHEST WITH CONTRAST TECHNIQUE: Multidetector CT imaging of the chest was performed using the standard protocol during bolus administration of intravenous contrast. Multiplanar CT image reconstructions and MIPs were obtained to evaluate the vascular anatomy. CONTRAST:  26m ISOVUE-370 IOPAMIDOL (ISOVUE-370) INJECTION 76% COMPARISON:  Chest radiographs 1042 hours today and earlier, including CTA chest 11/01/2017 FINDINGS: Cardiovascular: Adequate contrast bolus timing in the pulmonary arterial tree. Intermittent motion artifact in both lungs. There is no central or saddle pulmonary embolus. About the hila, no definite pulmonary artery filling defect is identified, and the low-density thrombus seen at the right hilum in February is no longer evident. The distal branches are not well evaluated. Negative visible aorta. Stable cardiac size, no cardiomegaly or pleural effusion. No definite calcified coronary artery atherosclerosis, although there is pulsation and motion artifact. Stable right chest porta cath. Mediastinum/Nodes: No mediastinal lymphadenopathy. Lungs/Pleura: Worsening bilateral ground-glass and confluent pulmonary opacity since the February CTA. There is  confluent peribronchial involvement throughout both lungs. There is continued dense peribronchial consolidation in the left lower lobe. The major airways remain patent. A small layering left pleural effusion is new since February. There is also new trace right pleural fluid. Upper Abdomen: Stable and negative visible upper abdomen. Musculoskeletal: Diffuse sclerotic bone metastases. No new osseous abnormality identified in the chest. Review of the MIP images confirms the above findings. IMPRESSION: 1. No central or hilar pulmonary embolus identified today. The distal branches are not well evaluated. 2. Unresolved, progressed, and now severe bilateral widespread airspace opacity since the February CTA. Diffuse, confluent pulmonary ground-glass opacity with some septal thickening, and continued dense left lower lobe peribronchial consolidation - such as due to a severe pneumonia. This is nonspecific, and bronchoscopy may be helpful if the patient is stable. On review of the literature there is a known severe lung toxicity to ALK Inhibitors such as Brigatinib, which can have a radiographic pattern of pneumonia, although the changes seen today began in February. 3. New small layering pleural effusions greater on the left. 4. Diffuse osseous metastatic disease. Electronically Signed   By: HGenevie AnnM.D.   On: 12/30/2017 14:45    Medications: I have reviewed the patient's current medications.  Assessment/Plan: This is a very pleasant and unfortunate 52years old white male diagnosed with a [name] diagnosed in July 2015 with positive EGFR mutation status post several regimens of targeted therapy including treatment with GILOTRIF then Iressa for EGFR T790M resistant mutation.  The patient developed another resistant mutation [id number] November 2018. He is currently  on treatment with Tagrisso and he received 1 dose of Brigatinib (Alungbrig) 2 days ago.  He has worsening dyspnea few hours  after this treatment.  This could be secondary to disease progression but also drug-induced pneumonitis from Brigatinib (Alungbrig) is a possibility. I strongly recommended for the patient to stop taking Brigatinib (Alungbrig) at this point.  He may continue his treatment with Tagrisso if needed but after few days of his stabilization of his condition. I strongly recommend to continue her on good broad-spectrum coverage of antibiotics for the possibility of pneumonia. We should also consider the patient for high-dose steroids with prednisone 1 mg/KG to be tapered slowly over the next few weeks. For the history of pulmonary embolism, he will continue his current treatment with Lovenox. For pain management, will start the patient on Percocet 10/325 mg p.o. every 4 hours as needed. Unfortunately his prognosis is poor at this point and I would also recommend palliative care evaluation for him. Thank you for taking good care of Mr. Boger, I would continue to follow-up the patient with you and assist in his management.  LOS: 0 days    Eilleen Kempf 12/30/2017

## 2017-12-27 ENCOUNTER — Encounter: Payer: Self-pay | Admitting: *Deleted

## 2017-12-27 ENCOUNTER — Inpatient Hospital Stay (HOSPITAL_COMMUNITY): Payer: BLUE CROSS/BLUE SHIELD

## 2017-12-27 DIAGNOSIS — E440 Moderate protein-calorie malnutrition: Secondary | ICD-10-CM

## 2017-12-27 LAB — CBC
HCT: 24.3 % — ABNORMAL LOW (ref 39.0–52.0)
Hemoglobin: 7.5 g/dL — ABNORMAL LOW (ref 13.0–17.0)
MCH: 29.9 pg (ref 26.0–34.0)
MCHC: 30.9 g/dL (ref 30.0–36.0)
MCV: 96.8 fL (ref 78.0–100.0)
PLATELETS: 87 10*3/uL — AB (ref 150–400)
RBC: 2.51 MIL/uL — ABNORMAL LOW (ref 4.22–5.81)
RDW: 17.4 % — AB (ref 11.5–15.5)
WBC: 4.4 10*3/uL (ref 4.0–10.5)

## 2017-12-27 LAB — MAGNESIUM: Magnesium: 1.7 mg/dL (ref 1.7–2.4)

## 2017-12-27 LAB — COMPREHENSIVE METABOLIC PANEL
ALBUMIN: 2.3 g/dL — AB (ref 3.5–5.0)
ALT: 17 U/L (ref 17–63)
ANION GAP: 8 (ref 5–15)
AST: 31 U/L (ref 15–41)
Alkaline Phosphatase: 610 U/L — ABNORMAL HIGH (ref 38–126)
BUN: 11 mg/dL (ref 6–20)
CO2: 21 mmol/L — ABNORMAL LOW (ref 22–32)
Calcium: 7.7 mg/dL — ABNORMAL LOW (ref 8.9–10.3)
Chloride: 111 mmol/L (ref 101–111)
Creatinine, Ser: 1.12 mg/dL (ref 0.61–1.24)
GFR calc non Af Amer: >60 mL/min (ref >60)
GLUCOSE: 114 mg/dL — AB (ref 65–99)
POTASSIUM: 5.1 mmol/L (ref 3.5–5.1)
Sodium: 140 mmol/L (ref 135–145)
Total Bilirubin: 1.1 mg/dL (ref 0.3–1.2)
Total Protein: 5.8 g/dL — ABNORMAL LOW (ref 6.5–8.1)

## 2017-12-27 LAB — GLUCOSE, CAPILLARY
GLUCOSE-CAPILLARY: 109 mg/dL — AB (ref 65–99)
GLUCOSE-CAPILLARY: 152 mg/dL — AB (ref 65–99)
Glucose-Capillary: 128 mg/dL — ABNORMAL HIGH (ref 65–99)
Glucose-Capillary: 144 mg/dL — ABNORMAL HIGH (ref 65–99)
Glucose-Capillary: 144 mg/dL — ABNORMAL HIGH (ref 65–99)
Glucose-Capillary: 159 mg/dL — ABNORMAL HIGH (ref 65–99)

## 2017-12-27 LAB — BLOOD GAS, ARTERIAL
Acid-base deficit: 5.4 mmol/L — ABNORMAL HIGH (ref 0.0–2.0)
Bicarbonate: 19.5 mmol/L — ABNORMAL LOW (ref 20.0–28.0)
Drawn by: 225631
FIO2: 60
LHR: 20 {breaths}/min
O2 Saturation: 99.4 %
PEEP: 10 cmH2O
Patient temperature: 99.1
VT: 0.5 mL
pCO2 arterial: 39 mmHg (ref 32.0–48.0)
pH, Arterial: 7.322 — ABNORMAL LOW (ref 7.350–7.450)
pO2, Arterial: 145 mmHg — ABNORMAL HIGH (ref 83.0–108.0)

## 2017-12-27 LAB — HIV ANTIBODY (ROUTINE TESTING W REFLEX): HIV SCREEN 4TH GENERATION: NONREACTIVE

## 2017-12-27 LAB — STREP PNEUMONIAE URINARY ANTIGEN: Strep Pneumo Urinary Antigen: NEGATIVE

## 2017-12-27 LAB — MRSA PCR SCREENING: MRSA by PCR: NEGATIVE

## 2017-12-27 LAB — PHOSPHORUS: Phosphorus: 2 mg/dL — ABNORMAL LOW (ref 2.5–4.6)

## 2017-12-27 MED ORDER — FREE WATER
100.0000 mL | Freq: Four times a day (QID) | Status: DC
  Administered 2017-12-27 – 2017-12-29 (×8): 100 mL

## 2017-12-27 MED ORDER — VITAL 1.5 CAL PO LIQD
1000.0000 mL | ORAL | Status: DC
  Administered 2017-12-27 – 2017-12-28 (×2): 1000 mL
  Filled 2017-12-27 (×3): qty 1000

## 2017-12-27 MED ORDER — VITAL HIGH PROTEIN PO LIQD
1000.0000 mL | ORAL | Status: DC
  Filled 2017-12-27: qty 1000

## 2017-12-27 MED ORDER — INSULIN ASPART 100 UNIT/ML ~~LOC~~ SOLN
1.0000 [IU] | SUBCUTANEOUS | Status: DC
  Administered 2017-12-27 (×2): 1 [IU] via SUBCUTANEOUS
  Administered 2017-12-28 (×2): 2 [IU] via SUBCUTANEOUS
  Administered 2017-12-28 (×2): 1 [IU] via SUBCUTANEOUS
  Administered 2017-12-28 – 2017-12-29 (×5): 2 [IU] via SUBCUTANEOUS

## 2017-12-27 MED ORDER — SODIUM CHLORIDE 0.9 % IV BOLUS
500.0000 mL | Freq: Once | INTRAVENOUS | Status: AC
  Administered 2017-12-27: 500 mL via INTRAVENOUS

## 2017-12-27 MED ORDER — PRO-STAT SUGAR FREE PO LIQD
30.0000 mL | Freq: Three times a day (TID) | ORAL | Status: DC
  Administered 2017-12-27 – 2017-12-28 (×3): 30 mL
  Filled 2017-12-27 (×3): qty 30

## 2017-12-27 MED ORDER — PRO-STAT SUGAR FREE PO LIQD
30.0000 mL | Freq: Two times a day (BID) | ORAL | Status: DC
  Administered 2017-12-27: 30 mL
  Filled 2017-12-27: qty 30

## 2017-12-27 NOTE — Progress Notes (Signed)
PULMONARY / CRITICAL CARE MEDICINE   Name: Oscar Floyd MRN: [id number] DOB: 05/31/1966    ADMISSION DATE:  12/24/2017 CONSULTATION DATE:  12/19/2017  REFERRING MD:  [facility].  CHIEF COMPLAINT:  Dyspnea.  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 Nevertheless recent performance status had been good. Sudden worsening of dyspnea immediately following first dose of brigatinib.    Patient and family have agreed with short term intubation in the hope that much of this sudden deterioration is related to medication-related pneumonitis.   PAST MEDICAL HISTORY :  He  has a past medical history of Bone metastases ([place]) (08/26/2015), Encounter for antineoplastic chemotherapy (01/06/2016), Family history of cancer, Hypertension, and Lung cancer ([place]).  PAST SURGICAL HISTORY: He  has a past surgical history that includes Vasectomy; Video bronchoscopy (Bilateral, 03/20/2014); Video bronchoscopy (Bilateral, 07/20/2017); IR FLUORO GUIDE PORT INSERTION RIGHT (10/19/2017); and IR US Guide Vasc Access Right (10/19/2017).  No Known Allergies  No current facility-administered medications on file prior to encounter.    Current Outpatient Medications on File Prior to Encounter  Medication Sig  . albuterol (PROVENTIL) (2.5 MG/3ML) 0.083% nebulizer solution Take 3 mLs (2.5 mg total) by nebulization every 6 (six) hours as needed for wheezing or shortness of breath. Dx: J45.31  . ALPRAZolam (XANAX) 0.5 MG tablet Take 1 tablet (0.5 mg total) by mouth at bedtime as needed. Take 1-2 tablets po at night for sleep  . Brigatinib (ALUNBRIG) 90 & 180 MG TBPK Take 90 mg by mouth daily. Take for 7 days. If tolerated, increase dose to 140m once daily.  . budesonide-formoterol (SYMBICORT) 80-4.5 MCG/ACT inhaler Inhale 2 puffs into the lungs 2 (two) times daily.  . diazepam (VALIUM) 10 MG tablet Take 1 tab po 30  minutes prior to brain MRI  . docusate sodium (COLACE) 100 MG capsule Take 100 mg by mouth daily.  .Marland Kitchenenoxaparin (LOVENOX) 150 MG/ML injection Inject 0.9 mLs (135 mg total) into the skin daily.  .Marland Kitchenesomeprazole (NEXIUM) 40 MG capsule Take 1 capsule (40 mg total) by mouth daily at 12 noon.  .Marland KitchenHYDROcodone-acetaminophen (NORCO/VICODIN) 5-325 MG tablet Take 1 tablet by mouth every 6 (six) hours as needed for moderate pain.  .Marland Kitchenosimertinib mesylate (TAGRISSO) 80 MG tablet Take 80 mg by mouth daily.  .Marland Kitchenalbuterol (PROVENTIL HFA;VENTOLIN HFA) 108 (90 Base) MCG/ACT inhaler Inhale 2 puffs into the lungs every 6 (six) hours as needed for wheezing or shortness of breath. (Patient not taking: Reported on 12/13/2017)  . ipratropium-albuterol (DUONEB) 0.5-2.5 (3) MG/3ML SOLN Take 3 mLs by nebulization 3 (three) times daily. (Patient not taking: Reported on 12/13/2017)  . protein supplement shake (PREMIER PROTEIN) LIQD Take 325 mLs (11 oz total) by mouth daily.    FAMILY HISTORY:  His indicated that his mother is alive. He indicated that his father is deceased. He indicated that his brother is alive. He indicated that his maternal grandmother is deceased. He indicated that his maternal grandfather is deceased. He indicated that his paternal grandmother is deceased. He indicated that his paternal grandfather is deceased. He indicated that his maternal aunt is deceased. He indicated that his other is deceased.   SOCIAL HISTORY: He  reports that he has never smoked. He has never used smokeless tobacco. He reports that he drinks alcohol. He reports that he does not use drugs.  REVIEW OF SYSTEMS:   Had been functioning well and  home until this admission. Had been ambulating independently. Reports some loss of appetite.   SUBJECTIVE:  Currently intubated. Denies pain.   VITAL SIGNS: BP (!) 145/90   Pulse 96   Temp 97.8 F (36.6 C) (Oral)   Resp 10   Ht 6' 2"  (1.88 m)   Wt 194 lb 3.6 oz (88.1 kg)   SpO2 95%   BMI  24.94 kg/m   HEMODYNAMICS:  Not requiring pressors.  VENTILATOR SETTINGS: Vent Mode: PRVC FiO2 (%):  [40 %-100 %] 40 % Set Rate:  [20 bmp] 20 bmp Vt Set:  [500 mL] 500 mL PEEP:  [5 cmH20-10 cmH20] 10 cmH20 Plateau Pressure:  [19 cmH20-39 cmH20] 23 cmH20  INTAKE / OUTPUT: I/O last 3 completed shifts: In: 2621.9 [I.V.:521.9; IV [id number]:0086] Out: 625 [Urine:625]  PHYSICAL EXAMINATION: General:  Calm, well-nourished, pallor Neuro:  Awake and following commands. No focal deficits. HEENT:  7.5cm ETT in place, Cardiovascular:  Normotensive in sinus rhythm. HS normal, extremities warm. No edema. Lungs:  No ventilator dyssynchrony. Chest clear to posterior axillary line. Abdomen:  Soft non-tender Musculoskeletal:  No active joint disease.  Skin:  IV sites intact.  LABS:  BMET Recent Labs  Lab 12/21/2017 1040 12/27/17 0300  NA 135 140  K 4.1 5.1  CL 104 111  CO2 20* 21*  BUN 11 11  CREATININE 1.12 1.12  GLUCOSE 121* 114*    Electrolytes Recent Labs  Lab 01/04/2018 1040 12/27/17 0300  CALCIUM 8.4* 7.7*  MG  --  1.7  PHOS  --  2.0*    CBC Recent Labs  Lab 01/01/2018 1040 12/27/17 0300  WBC 7.4 4.4  HGB 9.3* 7.5*  HCT 29.3* 24.3*  PLT 112* 87*    Coag's No results for input(s): APTT, INR in the last 168 hours.  Sepsis Markers Recent Labs  Lab 12/21/2017 1224  LATICACIDVEN 1.62    ABG Recent Labs  Lab 12/20/2017 1335 12/18/2017 2130 12/27/17 0350  PHART 7.356 7.309* 7.322*  PCO2ART 36.7 42.6 39.0  PO2ART 117* 110* 145*    Liver Enzymes Recent Labs  Lab 12/27/2017 1040 12/27/17 0300  AST 38 31  ALT 19 17  ALKPHOS 690* 610*  BILITOT 1.4* 1.1  ALBUMIN 2.8* 2.3*    Cardiac Enzymes No results for input(s): TROPONINI, PROBNP in the last 168 hours.  Glucose Recent Labs  Lab 12/13/2017 1932 12/19/2017 2331 12/27/17 0308 12/27/17 0834 12/27/17 1157  GLUCAP 108* 103* 109* 128* 152*    Imaging Ct Angio Chest Pe W And/or Wo Contrast  Result  Date: 12/21/2017 CLINICAL DATA:  52 year old male with stage IV lung cancer. Recent onset, progressive shortness of breath after beginning a new treatment (Brigatinib). EXAM: CT ANGIOGRAPHY CHEST WITH CONTRAST TECHNIQUE: Multidetector CT imaging of the chest was performed using the standard protocol during bolus administration of intravenous contrast. Multiplanar CT image reconstructions and MIPs were obtained to evaluate the vascular anatomy. CONTRAST:  25m ISOVUE-370 IOPAMIDOL (ISOVUE-370) INJECTION 76% COMPARISON:  Chest radiographs 1042 hours today and earlier, including CTA chest 11/01/2017 FINDINGS: Cardiovascular: Adequate contrast bolus timing in the pulmonary arterial tree. Intermittent motion artifact in both lungs. There is no central or saddle pulmonary embolus. About the hila, no definite pulmonary artery filling defect is identified, and the low-density thrombus seen at the right hilum in February is no longer evident. The distal branches are not well evaluated. Negative visible aorta. Stable cardiac size, no cardiomegaly or pleural effusion. No definite calcified coronary artery atherosclerosis, although there is pulsation  and motion artifact. Stable right chest porta cath. Mediastinum/Nodes: No mediastinal lymphadenopathy. Lungs/Pleura: Worsening bilateral ground-glass and confluent pulmonary opacity since the February CTA. There is confluent peribronchial involvement throughout both lungs. There is continued dense peribronchial consolidation in the left lower lobe. The major airways remain patent. A small layering left pleural effusion is new since February. There is also new trace right pleural fluid. Upper Abdomen: Stable and negative visible upper abdomen. Musculoskeletal: Diffuse sclerotic bone metastases. No new osseous abnormality identified in the chest. Review of the MIP images confirms the above findings. IMPRESSION: 1. No central or hilar pulmonary embolus identified today. The distal  branches are not well evaluated. 2. Unresolved, progressed, and now severe bilateral widespread airspace opacity since the February CTA. Diffuse, confluent pulmonary ground-glass opacity with some septal thickening, and continued dense left lower lobe peribronchial consolidation - such as due to a severe pneumonia. This is nonspecific, and bronchoscopy may be helpful if the patient is stable. On review of the literature there is a known severe lung toxicity to ALK Inhibitors such as Brigatinib, which can have a radiographic pattern of pneumonia, although the changes seen today began in February. 3. New small layering pleural effusions greater on the left. 4. Diffuse osseous metastatic disease. Electronically Signed   By: Genevie Ann M.D.   On: 01/04/2018 14:45   Dg Chest Port 1 View  Result Date: 12/27/2017 CLINICAL DATA:  Acute respiratory failure with hypoxia. EXAM: PORTABLE CHEST 1 VIEW COMPARISON:  Radiographs of December 26, 2017. FINDINGS: Stable cardiomediastinal silhouette. Endotracheal and nasogastric tubes are unchanged in position. Right internal jugular Port-A-Cath is unchanged in position. Stable bilateral diffuse interstitial densities throughout both lungs concerning for pulmonary edema or inflammation. No pneumothorax is noted. Stable left basilar opacity is noted concerning for atelectasis or infiltrate with associated pleural effusion. Bony thorax is unremarkable. IMPRESSION: Stable support apparatus. Stable bilateral diffuse interstitial densities concerning for edema or inflammation. Stable left basilar opacity as described above. Electronically Signed   By: Marijo Conception, M.D.   On: 12/27/2017 07:09   Dg Chest Port 1 View  Result Date: 12/27/2017 CLINICAL DATA:  Lung cancer EXAM: PORTABLE CHEST 1 VIEW COMPARISON:  Earlier the same day FINDINGS: 1750 hours. Endotracheal tube tip is 4.2 cm above the base of the carina. Right Port-A-Cath tip overlies the distal SVC. Heart size stable. Diffuse  interstitial and basilar airspace opacity again noted. NG tube tip overlies the mid stomach. The visualized bony structures of the thorax are intact. Telemetry leads overlie the chest. IMPRESSION: 1. Endotracheal tube tip 4.2 cm above the base the carina. 2. NG tube tip positioned in the mid stomach. 3. No substantial change cardiopulmonary exam. Electronically Signed   By: Misty Stanley M.D.   On: 12/20/2017 18:34     STUDIES:  None pending.  CULTURES: Negative so far  ANTIBIOTICS: Cefipime and vancomycin  SIGNIFICANT EVENTS: 4/16 patient intubated.  LINES/TUBES: Port a cath, ETT, PIV, Foley catheter  DISCUSSION: Acute hypoxic respiratory failure due to brigatinib, infection or disease progression. Given timing and GCO on CT, the first is most likely. Plan for short term intubation to allow lungs to recover.  ASSESSMENT / PLAN:  PULMONARY A: Acute respiratory failure due to ARDS. P:   Lung protective ventilation. Complete 48h of steroids then start SBT's Bronchoscopy to formally rule-out infection, cytology and cell count if not improving after 48h or therapy.  CARDIOVASCULAR A:  Normotensive in sinus rhythm. Previously normal echo. P:  Continue to  monitor  RENAL A:   Clinically euvolemic. Mildly elevated creatinine. +3L fluid balance.  P:   Monitor for now, allow autoregulation.  Convert Foley to [place] catheter.  GASTROINTESTINAL A:   Moderate nutritional risk. P:   Initiate PEP-UP tube feeds.  HEMATOLOGIC A:   Cancer-related pancytopenia.  P:  No indications for transfusion. Currently on treatment dose Lovenox for cancer related DVT.  INFECTIOUS A:   No leukocytosis. On empiric HAP coverage.  P:   Continue current antibiotic coverage.   ENDOCRINE A:   Currently euglycemic on no therapy   P:   Add SSI as may have steroid-induced hyperglycemia once feeds are started.  NEUROLOGIC A:   Comfortable with no deficits on fentanyl infusion. P:   RASS  goal: 0 Continue current strategy.   FAMILY  - Updates: family updated at the bedside.  - Inter-disciplinary family meet or Palliative Care meeting due by:  Day 4.    Pulmonary and [place] Pager: [phone]  12/27/2017, 1:13 PM

## 2017-12-27 NOTE — Progress Notes (Signed)
Oncology Nurse Navigator Documentation  Oncology Nurse Navigator Flowsheets 12/27/2017  Navigator Location [facility]  Navigator Encounter Type Other/I went to [facility] to see Mr. Hall Busing. He is now on the vent and some sedation. I was able to speak with him and his family.  I explained treatment plan.  I spent time with them listening to them on this process.   Patient Visit Type Inpatient  Treatment Phase Treatment  Barriers/Navigation Needs Education  Education Other  Interventions Education  Education Method Verbal  Acuity Level 1  Time Spent with Patient 30

## 2017-12-27 NOTE — Progress Notes (Signed)
Initial Nutrition Assessment  DOCUMENTATION CODES:   Non-severe (moderate) malnutrition in context of chronic illness  INTERVENTION:  - Will order TF: 30 mL Prostat TID with Vital 1.5 @ 25 mL/hr to advance by 10 mL every 8 hours to reach goal rate of Vital 1.5 @ 55 mL/hr. At goal rate, this regimen will provide 2280 kcal, 134 grams of protein, and 1008 mL free water. - Will order 100 mL free water QID.   Monitor magnesium, potassium, and phosphorus daily for at least 3 days, MD to replete as needed, as pt is at risk for refeeding syndrome given malnutrition, decreasing appetite PTA, current hypophosphatemia.   NUTRITION DIAGNOSIS:   Moderate Malnutrition related to chronic illness, catabolic illness, cancer and cancer related treatments as evidenced by mild muscle depletion, mild fat depletion.  GOAL:   Patient will meet greater than or equal to 90% of their needs  MONITOR:   Vent status, TF tolerance, Weight trends, Labs  REASON FOR ASSESSMENT:   Ventilator, Consult Enteral/tube feeding initiation and management  ASSESSMENT:   52 y.o. male with medical history significant of [name], hypertension presenting with worsening shortness of breath with imaging findings concerning for pneumonia versus drug-induced pneumonitis versus progression of his malignancy.  BMI indicates normal weight. Pt intubated with OGT in place. He was intubated around 8:00 AM today. Will order TF as outlined above. Pt able to nod/shake head and indicates that he is not experiencing abdominal pain or nausea at this time. Pt's mom at bedside and TF explained to her and pt. Mom states that pt was dx with lung cancer 4 years ago and has had slow decline in appetite during that time and that this may have worsened more recently but she is not sure. She was unable to provide any other nutrition-related information.  Patient is currently intubated on ventilator support MV: 10.9  L/min Temp (24hrs), Avg:99.9 F (37.7 C), Min:97.8 F (36.6 C), Max:101.3 F (38.5 C) Propofol: none BP: 147/76 and MAP: 100  Medications reviewed; 20 mg Pepcid per OGT BID, 125 mg Solu-medrol x1 yesterday, 60 mg Solu-medrol TID. Labs reviewed; CBGs: 109 and 128 mg/dL, Ca: 7.7 mg/dL, Phos: 2 mg/dL.  IVF: NS @ 50 mL/hr.  Drip: Fentanyl @ 300 mcg/hr.      NUTRITION - FOCUSED PHYSICAL EXAM:    Most Recent Value  Orbital Region  No depletion  Upper Arm Region  Mild depletion  Thoracic and Lumbar Region  No depletion  Buccal Region  No depletion  Temple Region  No depletion  Clavicle Bone Region  Mild depletion  Clavicle and Acromion Bone Region  Mild depletion  Scapular Bone Region  No depletion  Dorsal Hand  No depletion  Patellar Region  No depletion  Anterior Thigh Region  Unable to assess  Posterior Calf Region  Mild depletion  Edema (RD Assessment)  None  Hair  Reviewed  Eyes  Reviewed  Mouth  Unable to assess  Skin  Reviewed  Nails  Reviewed       Diet Order:  Diet NPO time specified  EDUCATION NEEDS:   No education needs have been identified at this time  Skin:  Skin Assessment: Reviewed RN Assessment  Last BM:  4/14 (PTA)  Height:   Ht Readings from Last 1 Encounters:  12/19/2017 6\' 2"  (1.88 m)    Weight:   Wt Readings from Last 1 Encounters:  12/27/17 194 lb 3.6 oz (88.1 kg)    Ideal Body  Weight:  86.36 kg  BMI:  Body mass index is 24.94 kg/m.  Estimated Nutritional Needs:   Kcal:  2297  Protein:  106-132 grams (1.2-1.5 grams/kg)  Fluid:  >/= 2.2 L/day      Jarome Matin, MS, RD, LDN, [facility] Inpatient Clinical Dietitian Pager # [phone] After hours/weekend pager # [phone]

## 2017-12-27 NOTE — Progress Notes (Signed)
RT provided RN with ordered sputum sample.

## 2017-12-27 NOTE — Progress Notes (Addendum)
Pt due to void. Pt had foley catheter removed on day shift, Pt has had 0 output since 1845. RN spoke with E-link MD and orders given to perform in & out cath if over 400 ml on bladder scanner.Bladder scan showed 330 ml per NT.   RN will continue to monitor.   @0300 - Pt still unable and due to void. In and out cath performed. 600 ml urine.

## 2017-12-27 NOTE — Procedures (Signed)
Intubation Procedure Note Oscar Floyd [id number] 07-14-1966  Procedure: Intubation Indications: Respiratory insufficiency  Procedure Details Consent: Risks of procedure as well as the alternatives and risks of each were explained to the (patient/caregiver).  Consent for procedure obtained. Time Out: Verified patient identification, verified procedure, site/side was marked, verified correct patient position, special equipment/implants available, medications/allergies/relevent history reviewed, required imaging and test results available.  Performed  Maximum sterile technique was used including n/a.  MAC and 4    Evaluation Hemodynamic Status: BP stable throughout; O2 sats: stable throughout Patient's Current Condition: stable Complications: No apparent complications Patient did tolerate procedure well. Chest X-ray ordered to verify placement.  CXR: pending.   Oscar Floyd Oscar Floyd 12/27/2017

## 2017-12-28 ENCOUNTER — Other Ambulatory Visit: Payer: Self-pay | Admitting: Radiation Therapy

## 2017-12-28 ENCOUNTER — Encounter: Payer: Self-pay | Admitting: *Deleted

## 2017-12-28 DIAGNOSIS — J80 Acute respiratory distress syndrome: Principal | ICD-10-CM

## 2017-12-28 LAB — GLUCOSE, CAPILLARY
GLUCOSE-CAPILLARY: 149 mg/dL — AB (ref 65–99)
GLUCOSE-CAPILLARY: 152 mg/dL — AB (ref 65–99)
GLUCOSE-CAPILLARY: 186 mg/dL — AB (ref 65–99)
GLUCOSE-CAPILLARY: 158 mg/dL — AB (ref 65–99)
GLUCOSE-CAPILLARY: 160 mg/dL — AB (ref 65–99)
Glucose-Capillary: 154 mg/dL — ABNORMAL HIGH (ref 65–99)

## 2017-12-28 LAB — BASIC METABOLIC PANEL
Anion gap: 9 (ref 5–15)
BUN: 16 mg/dL (ref 6–20)
CO2: 22 mmol/L (ref 22–32)
Calcium: 8 mg/dL — ABNORMAL LOW (ref 8.9–10.3)
Chloride: 110 mmol/L (ref 101–111)
Creatinine, Ser: 0.84 mg/dL (ref 0.61–1.24)
GFR calc Af Amer: >60 mL/min (ref >60)
Glucose, Bld: 160 mg/dL — ABNORMAL HIGH (ref 65–99)
POTASSIUM: 4.1 mmol/L (ref 3.5–5.1)
SODIUM: 141 mmol/L (ref 135–145)

## 2017-12-28 LAB — CBC WITH DIFFERENTIAL/PLATELET
BASOS ABS: 0 10*3/uL (ref 0.0–0.1)
Basophils Relative: 0 %
EOS PCT: 0 %
Eosinophils Absolute: 0 10*3/uL (ref 0.0–0.7)
HCT: 22.2 % — ABNORMAL LOW (ref 39.0–52.0)
Hemoglobin: 7 g/dL — ABNORMAL LOW (ref 13.0–17.0)
Lymphocytes Relative: 8 %
Lymphs Abs: 0.3 10*3/uL — ABNORMAL LOW (ref 0.7–4.0)
MCH: 30.6 pg (ref 26.0–34.0)
MCHC: 31.5 g/dL (ref 30.0–36.0)
MCV: 96.9 fL (ref 78.0–100.0)
Monocytes Absolute: 0.2 10*3/uL (ref 0.1–1.0)
Monocytes Relative: 5 %
Neutro Abs: 3.6 10*3/uL (ref 1.7–7.7)
Neutrophils Relative %: 87 %
PLATELETS: 86 10*3/uL — AB (ref 150–400)
RBC: 2.29 MIL/uL — AB (ref 4.22–5.81)
RDW: 17.2 % — ABNORMAL HIGH (ref 11.5–15.5)
WBC: 4.1 10*3/uL (ref 4.0–10.5)

## 2017-12-28 LAB — LEGIONELLA PNEUMOPHILA SEROGP 1 UR AG: L. PNEUMOPHILA SEROGP 1 UR AG: NEGATIVE

## 2017-12-28 LAB — HEMOGLOBIN AND HEMATOCRIT, BLOOD
HEMATOCRIT: 26.5 % — AB (ref 39.0–52.0)
HEMOGLOBIN: 8.2 g/dL — AB (ref 13.0–17.0)

## 2017-12-28 LAB — BRAIN NATRIURETIC PEPTIDE: B NATRIURETIC PEPTIDE 5: 61.7 pg/mL (ref 0.0–100.0)

## 2017-12-28 LAB — PREPARE RBC (CROSSMATCH)

## 2017-12-28 MED ORDER — FUROSEMIDE 10 MG/ML IJ SOLN
20.0000 mg | Freq: Once | INTRAMUSCULAR | Status: AC
  Administered 2017-12-28: 20 mg via INTRAVENOUS
  Filled 2017-12-28: qty 2

## 2017-12-28 MED ORDER — HYDRALAZINE HCL 20 MG/ML IJ SOLN
10.0000 mg | INTRAMUSCULAR | Status: DC | PRN
  Administered 2017-12-28 – 2017-12-29 (×2): 10 mg via INTRAVENOUS
  Filled 2017-12-28 (×2): qty 1

## 2017-12-28 MED ORDER — PRO-STAT SUGAR FREE PO LIQD
60.0000 mL | Freq: Two times a day (BID) | ORAL | Status: DC
  Administered 2017-12-28 – 2017-12-29 (×2): 60 mL
  Filled 2017-12-28 (×2): qty 60

## 2017-12-28 MED ORDER — SODIUM CHLORIDE 0.9 % IV SOLN: Freq: Once | INTRAVENOUS | Status: DC

## 2017-12-28 MED ORDER — VITAL 1.5 CAL PO LIQD
1000.0000 mL | ORAL | Status: DC
  Administered 2017-12-28: 1000 mL
  Filled 2017-12-28 (×2): qty 1000

## 2017-12-28 NOTE — Progress Notes (Addendum)
PULMONARY / CRITICAL CARE MEDICINE   Name: Oscar Floyd MRN: [id number] DOB: 05/31/1966    ADMISSION DATE:  12/24/2017 CONSULTATION DATE:  12/19/2017  REFERRING MD:  Fl[facility]ne [facility].  CHIEF COMPLAINT:  Dyspnea.  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 Nevertheless recent performance status had been good. Sudden worsening of dyspnea immediately following first dose of brigatinib.    Patient and family have agreed with short term intubation in the hope that much of this sudden deterioration is related to medication-related pneumonitis.   PAST MEDICAL HISTORY :  He  has a past medical history of Bone metastases ([place]) (08/26/2015), Encounter for antineoplastic chemotherapy (01/06/2016), Family history of cancer, Hypertension, and Lung cancer ([place]).  PAST SURGICAL HISTORY: He  has a past surgical history that includes Vasectomy; Video bronchoscopy (Bilateral, 03/20/2014); Video bronchoscopy (Bilateral, 07/20/2017); IR FLUORO GUIDE PORT INSERTION RIGHT (10/19/2017); and IR US Guide Vasc Access Right (10/19/2017).  No Known Allergies  No current facility-administered medications on file prior to encounter.    Current Outpatient Medications on File Prior to Encounter  Medication Sig  . albuterol (PROVENTIL) (2.5 MG/3ML) 0.083% nebulizer solution Take 3 mLs (2.5 mg total) by nebulization every 6 (six) hours as needed for wheezing or shortness of breath. Dx: J45.31  . ALPRAZolam (XANAX) 0.5 MG tablet Take 1 tablet (0.5 mg total) by mouth at bedtime as needed. Take 1-2 tablets po at night for sleep  . Brigatinib (ALUNBRIG) 90 & 180 MG TBPK Take 90 mg by mouth daily. Take for 7 days. If tolerated, increase dose to 140m once daily.  . budesonide-formoterol (SYMBICORT) 80-4.5 MCG/ACT inhaler Inhale 2 puffs into the lungs 2 (two) times daily.  . diazepam (VALIUM) 10 MG tablet Take 1 tab po 30  minutes prior to brain MRI  . docusate sodium (COLACE) 100 MG capsule Take 100 mg by mouth daily.  .Marland Kitchenenoxaparin (LOVENOX) 150 MG/ML injection Inject 0.9 mLs (135 mg total) into the skin daily.  .Marland Kitchenesomeprazole (NEXIUM) 40 MG capsule Take 1 capsule (40 mg total) by mouth daily at 12 noon.  .Marland KitchenHYDROcodone-acetaminophen (NORCO/VICODIN) 5-325 MG tablet Take 1 tablet by mouth every 6 (six) hours as needed for moderate pain.  .Marland Kitchenosimertinib mesylate (TAGRISSO) 80 MG tablet Take 80 mg by mouth daily.  .Marland Kitchenalbuterol (PROVENTIL HFA;VENTOLIN HFA) 108 (90 Base) MCG/ACT inhaler Inhale 2 puffs into the lungs every 6 (six) hours as needed for wheezing or shortness of breath. (Patient not taking: Reported on 12/13/2017)  . ipratropium-albuterol (DUONEB) 0.5-2.5 (3) MG/3ML SOLN Take 3 mLs by nebulization 3 (three) times daily. (Patient not taking: Reported on 12/13/2017)  . protein supplement shake (PREMIER PROTEIN) LIQD Take 325 mLs (11 oz total) by mouth daily.    FAMILY HISTORY:  His indicated that his mother is alive. He indicated that his father is deceased. He indicated that his brother is alive. He indicated that his maternal grandmother is deceased. He indicated that his maternal grandfather is deceased. He indicated that his paternal grandmother is deceased. He indicated that his paternal grandfather is deceased. He indicated that his maternal aunt is deceased. He indicated that his other is deceased.   SOCIAL HISTORY: He  reports that he has never smoked. He has never used smokeless tobacco. He reports that he drinks alcohol. He reports that he does not use drugs.  REVIEW OF SYSTEMS:   Had been functioning well and  home until this admission. Had been ambulating independently. Reports some loss of appetite.   SUBJECTIVE:  Currently intubated.  He did experience discomfort with Foley catheter reinsertion for urinary retention.  VITAL SIGNS: BP (!) 127/100   Pulse 87   Temp 97.8 F (36.6 C) (Axillary)    Resp 20   Ht 6\' 2"  (1.88 m)   Wt 200 lb 13.4 oz (91.1 kg)   SpO2 98%   BMI 25.79 kg/m   HEMODYNAMICS:  Not requiring pressors.  VENTILATOR SETTINGS: Vent Mode: PRVC FiO2 (%):  [40 %] 40 % Set Rate:  [20 bmp] 20 bmp Vt Set:  [500 mL] 500 mL PEEP:  [5 cmH20-10 cmH20] 5 cmH20 Plateau Pressure:  [13 cmH20-21 cmH20] 21 cmH20  INTAKE / OUTPUT: I/O last 3 completed shifts: In: 2780.2 [I.V.:1616.9; NG/GT:563.3; IV Piggyback:600] Out: 6759 [Urine:1825]  PHYSICAL EXAMINATION: General:  Calm, well-nourished, pallor Neuro:  Awake and following commands. No focal deficits. HEENT:  7.5cm ETT in place, Cardiovascular:  Normotensive in sinus rhythm. HS normal, extremities warm. No edema. Lungs:  No ventilator dyssynchrony. Chest clear to posterior axillary line.  Able to maintain normal tidal volumes on brief trial of pressure support ventilation. Abdomen:  Soft non-tender Musculoskeletal:  No active joint disease.  Skin:  IV sites intact.  LABS:  BMET Recent Labs  Lab 12/17/2017 1040 12/27/17 0300 12/28/17 0402  NA 135 140 141  K 4.1 5.1 4.1  CL 104 111 110  CO2 20* 21* 22  BUN 11 11 16   CREATININE 1.12 1.12 0.84  GLUCOSE 121* 114* 160*    Electrolytes Recent Labs  Lab 12/14/2017 1040 12/27/17 0300 12/28/17 0402  CALCIUM 8.4* 7.7* 8.0*  MG  --  1.7  --   PHOS  --  2.0*  --     CBC Recent Labs  Lab 01/08/2018 1040 12/27/17 0300 12/28/17 0402  WBC 7.4 4.4 4.1  HGB 9.3* 7.5* 7.0*  HCT 29.3* 24.3* 22.2*  PLT 112* 87* 86*    Coag's No results for input(s): APTT, INR in the last 168 hours.  Sepsis Markers Recent Labs  Lab 01/08/2018 1224  LATICACIDVEN 1.62    ABG Recent Labs  Lab 12/16/2017 1335 12/18/2017 2130 12/27/17 0350  PHART 7.356 7.309* 7.322*  PCO2ART 36.7 42.6 39.0  PO2ART 117* 110* 145*    Liver Enzymes Recent Labs  Lab 01/02/2018 1040 12/27/17 0300  AST 38 31  ALT 19 17  ALKPHOS 690* 610*  BILITOT 1.4* 1.1  ALBUMIN 2.8* 2.3*     Cardiac Enzymes No results for input(s): TROPONINI, PROBNP in the last 168 hours.  Glucose Recent Labs  Lab 12/27/17 1628 12/27/17 1945 12/27/17 2325 12/28/17 0320 12/28/17 0738 12/28/17 1206  GLUCAP 144* 144* 159* 149* 152* 154*    Imaging No results found.   STUDIES:  None pending.  CULTURES: Negative so far  ANTIBIOTICS: Cefipime and vancomycin  SIGNIFICANT EVENTS: 4/16 patient intubated.  LINES/TUBES: Port a cath, ETT, PIV, Foley catheter  DISCUSSION: Acute hypoxic respiratory failure due to brigatinib, infection or disease progression. Given timing and GCO on CT, the first is most likely. Plan for short term intubation to allow lungs to recover.  ASSESSMENT / PLAN:  PULMONARY A: Acute respiratory failure due to ARDS. P:   Lung protective ventilation. Complete 48h of steroids then start SBT's Bronchoscopy to formally rule-out infection, cytology and cell count if not improving after 48h or therapy. Should be ready for extubation tomorrow  CARDIOVASCULAR A:  Normotensive in  sinus rhythm. Previously normal echo. P:  Continue to monitor  RENAL A:   Clinically euvolemic. Mildly elevated creatinine. +3L fluid balance.  P:   Monitor for now, allow autoregulation.  Foley required for urinary retention.  GASTROINTESTINAL A:   Moderate nutritional risk. P:   Initiate PEP-UP tube feeds.  HEMATOLOGIC A:   Cancer-related pancytopenia.  P:  No indications for transfusion. Currently on treatment dose Lovenox for cancer related DVT.  INFECTIOUS A:   No leukocytosis. On empiric HAP coverage.  P:   Continue current antibiotic coverage.   ENDOCRINE A:   Currently euglycemic on no therapy   P:   Add SSI as may have steroid-induced hyperglycemia once feeds are started.  NEUROLOGIC A:   Comfortable with no deficits on fentanyl infusion. P:   RASS goal: 0 Continue current strategy.   FAMILY  - Updates: family updated at the  bedside.  - Inter-disciplinary family meet or Palliative Care meeting due by:  Day 4.   Kipp Brood, MD Pulmonary and [place] Pager: [phone]  12/28/2017, 2:38 PM

## 2017-12-28 NOTE — Progress Notes (Signed)
Oncology Nurse Navigator Documentation  Oncology Nurse Navigator Flowsheets 12/28/2017  Navigator Location [facility]  Navigator Encounter Type Other/I went to [place] to speak with patient and family today.  Mr. Norem continues on the vent with minimal weaning yesterday.  Treatment plan today is weaning vent with today.  I help to explain this treatment plan to patient and family.  I offered support and encouragement.   Patient Visit Type Inpatient  Treatment Phase Other  Barriers/Navigation Needs Education  Education Other  Interventions Education;Other  Education Method Verbal  Acuity Level 2  Acuity Level 2 Educational needs;Other  Time Spent with Patient 45

## 2017-12-28 NOTE — Progress Notes (Signed)
eLink Physician-Brief Progress Note Patient Name: Oscar Floyd DOB: 1966/03/21 MRN: [id number]   Date of Service  12/28/2017  HPI/Events of Note  Hypertension  eICU Interventions  Hydralazine 10 mg iv Q 4 hrs prn SBP > 150        Velma Hanna U Shekina Cordell 12/28/2017, 8:09 PM

## 2017-12-28 NOTE — Progress Notes (Signed)
   12/28/17 1500  Clinical Encounter Type  Visited With Patient and family together;Health care provider  Visit Type Follow-up  Spiritual Encounters  Spiritual Needs Emotional;Prayer   Have visited with this patient on previous admission.  I know the family.  Sons were at bedside and spouse had stepped away.  More family coming.  Oldest son Eduard Clos stated they are are limiting visitors. They hope he can come off the vent tomorrow.  Will follow and support as needed. Chaplain Katherene Ponto

## 2017-12-28 NOTE — Progress Notes (Signed)
Nutrition Follow-up  DOCUMENTATION CODES:   Non-severe (moderate) malnutrition in context of chronic illness  INTERVENTION:  Will adjust TF regimen: Vital 1.5 @ 45 with 60 mL Prostat BID. This regimen will provide 2020 kcal, 133 grams of protein, and 825 mL free water.   Monitor magnesium, potassium, and phosphorus daily for at least 3 days, MD to replete as needed, as pt is at risk for refeeding syndrome given malnutrition, decreasing appetite PTA, hypophosphatemia yesterday.    NUTRITION DIAGNOSIS:   Moderate Malnutrition related to chronic illness, catabolic illness, cancer and cancer related treatments as evidenced by mild muscle depletion, mild fat depletion. -ongoing  GOAL:   Patient will meet greater than or equal to 90% of their needs -met with TF regimen  MONITOR:   Vent status, TF tolerance, Weight trends, Labs  ASSESSMENT:   52 y.o. male with medical history significant of [name], hypertension presenting with worsening shortness of breath with imaging findings concerning for pneumonia versus drug-induced pneumonitis versus progression of his malignancy.  Weight +8 lbs/4 kg compared to admission weight. Admission weight (87.1 kg) used in re-estimating kcal need this AM. Pt remains intubated with OGT in place. Son and wife at bedside this AM; questions concerning TF answered and family appreciative of pt receiving nutrition. He is currently receiving Vital 1.5 @ 45 mL/hr with 30 mL Prostat TID and 30 mL free water every 4 hours. This regimen is providing 1920 kcal, 118 grams of protein, and 1005 mL free water. Will adjust TF order as outlined above given change in estimated kcal need.   Patient is currently intubated on ventilator support MV: 10.3 L/min Temp (24hrs), Avg:98 F (36.7 C), Min:97.5 F (36.4 C), Max:98.5 F (36.9 C) BP: 184/89 and MAP: 119  Medications reviewed; 20 mg Pepcid per OGT BID, 20 mg IV Laisx x1 today, sliding scale  Novolog, 60 mcg Solu-medrol TID. Labs reviewed; CBGs: 149 and 152 mg/dL this AM, Ca: 8 mg/dL, K WDL but down from 4/16, no Phos or Mg drawn but Phos was low 4/16.   IVF: NS @ 50 mL/hr.  Drip: Fentanyl @ 400 mcg/hr.     Diet Order:  Diet NPO time specified  EDUCATION NEEDS:   No education needs have been identified at this time  Skin:  Skin Assessment: Reviewed RN Assessment  Last BM:  4/14 (PTA)  Height:   Ht Readings from Last 1 Encounters:  01/04/2018 _0  (1.88 m)    Weight:   Wt Readings from Last 1 Encounters:  12/28/17 200 lb 13.4 oz (91.1 kg)    Ideal Body Weight:  86.36 kg  BMI:  Body mass index is 25.79 kg/m.  Estimated Nutritional Needs:   Kcal:  2002  Protein:  106-132 grams (1.2-1.5 grams/kg)  Fluid:  >/= 2.2 L/day      Jarome Matin, MS, RD, LDN, [facility] Inpatient Clinical Dietitian Pager # [phone] After hours/weekend pager # [phone]

## 2017-12-29 LAB — GLUCOSE, CAPILLARY
Glucose-Capillary: 158 mg/dL — ABNORMAL HIGH (ref 65–99)
Glucose-Capillary: 168 mg/dL — ABNORMAL HIGH (ref 65–99)

## 2017-12-29 LAB — BASIC METABOLIC PANEL
Anion gap: 7 (ref 5–15)
BUN: 15 mg/dL (ref 6–20)
CO2: 24 mmol/L (ref 22–32)
CREATININE: 0.81 mg/dL (ref 0.61–1.24)
Calcium: 8.2 mg/dL — ABNORMAL LOW (ref 8.9–10.3)
Chloride: 110 mmol/L (ref 101–111)
GFR calc Af Amer: >60 mL/min (ref >60)
GLUCOSE: 169 mg/dL — AB (ref 65–99)
Potassium: 3.9 mmol/L (ref 3.5–5.1)
SODIUM: 141 mmol/L (ref 135–145)

## 2017-12-29 LAB — CBC WITH DIFFERENTIAL/PLATELET
Basophils Absolute: 0 10*3/uL (ref 0.0–0.1)
Basophils Relative: 0 %
EOS ABS: 0 10*3/uL (ref 0.0–0.7)
EOS PCT: 0 %
HCT: 23.8 % — ABNORMAL LOW (ref 39.0–52.0)
Hemoglobin: 7.6 g/dL — ABNORMAL LOW (ref 13.0–17.0)
LYMPHS ABS: 0.2 10*3/uL — AB (ref 0.7–4.0)
LYMPHS PCT: 4 %
MCH: 30 pg (ref 26.0–34.0)
MCHC: 31.9 g/dL (ref 30.0–36.0)
MCV: 94.1 fL (ref 78.0–100.0)
MONO ABS: 0.4 10*3/uL (ref 0.1–1.0)
MONOS PCT: 8 %
Neutro Abs: 4.3 10*3/uL (ref 1.7–7.7)
Neutrophils Relative %: 88 %
PLATELETS: 82 10*3/uL — AB (ref 150–400)
RBC: 2.53 MIL/uL — ABNORMAL LOW (ref 4.22–5.81)
RDW: 18 % — ABNORMAL HIGH (ref 11.5–15.5)
WBC: 4.9 10*3/uL (ref 4.0–10.5)

## 2017-12-29 LAB — LEGIONELLA PNEUMOPHILA TOTAL AB: Legionella Pneumo Total Ab: <0.91 OD ratio (ref 0.00–0.90)

## 2017-12-29 LAB — BPAM RBC
Blood Product Expiration Date: 201905152359
ISSUE DATE / TIME: 201904171201
Unit Type and Rh: 5100

## 2017-12-29 LAB — TYPE AND SCREEN
ABO/RH(D): O POS
Antibody Screen: NEGATIVE
UNIT DIVISION: 0

## 2017-12-29 MED ORDER — FREE WATER: 30.0000 mL | Status: DC

## 2017-12-29 MED ORDER — DEXMEDETOMIDINE HCL IN NACL 200 MCG/50ML IV SOLN
0.2000 ug/kg/h | INTRAVENOUS | Status: DC
  Administered 2017-12-29: 1.2 ug/kg/h via INTRAVENOUS
  Administered 2017-12-29: 1 ug/kg/h via INTRAVENOUS
  Administered 2017-12-29: 0.2 ug/kg/h via INTRAVENOUS
  Filled 2017-12-29 (×4): qty 50

## 2017-12-29 MED ORDER — MORPHINE BOLUS VIA INFUSION
5.0000 mg | INTRAVENOUS | Status: DC | PRN
  Filled 2017-12-29: qty 20

## 2017-12-29 MED ORDER — MORPHINE 100MG IN NS 100ML (1MG/ML) PREMIX INFUSION
10.0000 mg/h | INTRAVENOUS | Status: DC
  Administered 2017-12-29: 10 mg/h via INTRAVENOUS
  Filled 2017-12-29: qty 100

## 2017-12-29 MED ORDER — MORPHINE SULFATE (PF) 4 MG/ML IV SOLN
2.0000 mg | INTRAVENOUS | Status: DC | PRN
  Administered 2017-12-29 (×2): 4 mg via INTRAVENOUS
  Filled 2017-12-29 (×2): qty 1

## 2017-12-30 LAB — CULTURE, RESPIRATORY: CULTURE: NORMAL

## 2017-12-30 LAB — CULTURE, RESPIRATORY W GRAM STAIN

## 2017-12-31 LAB — CULTURE, BLOOD (ROUTINE X 2)
CULTURE: NO GROWTH
Culture: NO GROWTH
Special Requests: ADEQUATE

## 2018-01-02 ENCOUNTER — Telehealth: Payer: Self-pay

## 2018-01-02 NOTE — Telephone Encounter (Signed)
On 01/02/18 I received a d/c from [place] (original) The d/c is for cremation. The patient is a patient of Doctor Agarwala. The d/c will be taken to Pulmonary Unit for signature.  On 01/03/18 I received the d/c back from Doctor Halford Chessman who signed the d/c for Doctor Agarwala. I got the d/c ready and called the funeral home to let them know the d/c is ready for pickup. I also faxed a copy to the funeral home per the funeral home request.

## 2018-01-11 NOTE — Progress Notes (Signed)
PATIENT REPORTED ANXIETY AND FEELING DIFFICULTY TAKING BREATHS. SEE MAR MORPHINE 4MG  GIVEN AS ORDERED. FAMILY REMAINS AT BEDSIDE; COMFORT CARE.

## 2018-01-11 NOTE — Progress Notes (Signed)
FENTANYL 2500 MCG IN NS 250 ML GTT DISCONTINUED AT AND REMAINDER WASTED 40 ML BY Ronnie Doss, RN AND Earma Reading, RN.

## 2018-01-11 NOTE — Progress Notes (Signed)
[facility]OR [facility] NOTIFIED. PATIENT SUITABLE FOR EYE DONATION. NS GAUSE PLACED ON PT'S EYES.FAMILY CONTACT INFORMATION GIVEN.

## 2018-01-11 NOTE — Progress Notes (Signed)
Pt extremely agitated and restless. Pt HR increased to 130s and RR 40s Fentanyl at max of 400 and PRN Versed 2mg  given. . E-link notified because medication was not effective. Physician ordered precedex drip and RN initiated medication. Precedex was effective. Pt eventually settled down and fell asleep. Plan for day shift is to ween pt and possibly extubate. RN will continue to monitor.

## 2018-01-11 NOTE — Progress Notes (Signed)
RT called to room by RN for desaturations and new onset of bright red blood in ETT.  Pt family in room said Pt was asleep and woke suddenly as if from a bad dream and began coughing and getting agitated.  RN came to room and suctioned Pt with bright red blood returned.  RT lavaged and suctioned Pt for bright red blood and increased his FiO2 to 50%.  Bilateral breath sounds were equal with good volume return on the ventilator.  RN went up on his sedation to help calm Pt down.  RT will communicate with dayshift RT concerns about new bright red blood in ETT.

## 2018-01-11 NOTE — Progress Notes (Signed)
eLink Physician-Brief Progress Note Patient Name: Oscar Floyd DOB: 05-31-1966 MRN: [id number]   Date of Service  2018-01-10  HPI/Events of Note  Inadequate sedation and analgesia.  eICU Interventions  Add dexmedotomidine infusion for sedation and analgesia. Extubation is planned for the AM.     Intervention Category Intermediate Interventions: Pain - evaluation and management Minor Interventions: Agitation / anxiety - evaluation and management  Kaylla Cobos U Pax Reasoner 2018/01/10, 1:33 AM

## 2018-01-11 NOTE — Progress Notes (Signed)
FAMILY AND MD AT BEDSIDE; POC DISCUSSED WITH ALLOWANCE FOR ALL QUESTIONS TO BE ASKED AND ANSWERED; AGREE WITH POC. RT CALLED IN PREPARATION OF EXTUBATION. 1020 PATIENT EXTUBATED; PLACED ON [place] @2  L; O2 SAT AS DOCUMENTED; PULSE OX CHANGED. 1027 PT PLACED ON NRB.O2 SAT REMAIN BELOW 90%. FAMILY REMAINS AT BEDSIDE.

## 2018-01-11 NOTE — Progress Notes (Signed)
MORPHINE GTT 80 + TUBING , WASTED AND VERIFIED VIA TWO RN Ronnie Doss, RN AND  Earma Reading, RN.

## 2018-01-11 NOTE — Progress Notes (Signed)
SBAR REPORT RECEIVED FROM BRANDY, RN; CARE ASSUMED. ASSESSMENT COMPLETED.  PATIENT ALERT; RESTLESS. FENTANYL GTT @ 250 MCG/HR; PRECEDEX @ 0.4 MCG/KG/HR - PRECEDEX INCREASED TO 1 MCG/KG/HR R/T INCREASED RR 38 AND PATIENTS NOTED ANXIETY. ETT SUCTIONED; RED DISCHARGE NOTED; SECURE AT 27. OG SECURED TO ETT TUBING; 0 RESIDUAL NOTED; PLACEMENT VARIFIED; AIR BOLUS. FAMILY AT BEDSIDE; POC CARE DISCUSSED WITH ALLOWANCE FOR ALL QUESTIONS TO BE ASKED AND ANSWERED; AGREE WITH POC.

## 2018-01-11 NOTE — Procedures (Signed)
Extubation Procedure Note  Patient Details:   Name: Herminio Kniskern DOB: 1966/01/27 MRN: [id number]   Airway Documentation:     Evaluation  O2 sats: currently acceptable Complications: No apparent complications Patient did tolerate procedure well. Bilateral Breath Sounds: Diminished, Clear   No   Patient extubated by PCCM MD - Dr. Lynetta Mare. Patient is transitioning to comfort care. Family at bedside with patient at this time. RT available as needed.   Lamonte Sakai 2018-01-04, 10:44 AM

## 2018-01-11 NOTE — Progress Notes (Signed)
PATIENT REPORTS CONTINUED SOB AND ASKED FOR MORPHINE. DISCUSSED WITH ANP; SEE ORDERS. 1210 MORPHINE 4 MG IV GIVEN. FAMILY REMAINS AT BEDSIDE. FAMILY DENIES ANY ADDITIONAL NEEDS. SPIRITUAL ADVISOR (PT'S OWN PASTOR) AT [place]. MCTCM

## 2018-01-11 NOTE — Progress Notes (Signed)
Pharmacy Antibiotic Note  Oscar Floyd is a 52 y.o. male admitted on 12/20/2017 with pneumonia.  PMH significant for HTN, PE (10/2017), metastatic lung cancer, with recent change in treatment.  Comes to ED with c/o worsening shortness of breath.  In the ED, patient received Ceftriaxone 1gm IV and Azithromycin 500mg  PO x 1 dose each.  Upon admission, MD has ordered Cefepime and pharmacy has been consulted for Vancomycin dosing.  Today, December 30, 2017: Day # 4 antibiotics Afebrile WBC 4.9 SCr 0.81  Plan:  Vancomycin 1gm IV q12h  Cefepime per MD  Follow cultures & sensitivities, renal function  Monitor vancomycin levels as appropriate.  Goal AUC 400-500.   Height: 6\' 2"  (188 cm) Weight: 194 lb 14.2 oz (88.4 kg) IBW/kg (Calculated) : 82.2  Temp (24hrs), Avg:98.2 F (36.8 C), Min:97.7 F (36.5 C), Max:98.8 F (37.1 C)  Recent Labs  Lab 12/28/2017 1040 12/30/2017 1224 12/27/17 0300 12/28/17 0402 Dec 30, 2017 0411  WBC 7.4  --  4.4 4.1 4.9  CREATININE 1.12  --  1.12 0.84 0.81  LATICACIDVEN  --  1.62  --   --   --     Estimated Creatinine Clearance: 126.9 mL/min (by C-G formula based on SCr of 0.81 mg/dL).    No Known Allergies  Antimicrobials this admission: 4/15 Azith x 1  4/15 Ceftriaxone x 1  4/15 Cefepime >> 4/15 Vanc >>  Dose adjustments this admission:    Microbiology results: 4/15 BCx: ngtd 4/15 Sputum: ngtd  4/15 MRSA PCR: neg  Thank you for allowing pharmacy to be a part of this patient's care.  Gretta Arab PharmD, BCPS Pager [phone] Dec 30, 2017 9:25 AM

## 2018-01-11 NOTE — Progress Notes (Signed)
PATIENT TRANSPORTED TO [facility].

## 2018-01-11 NOTE — Progress Notes (Signed)
PT WITH C/O CONTINUED SOB; DISTRESS NOTED AS WELL AS ANXIETY. SEE MAR MORPHINE GTT @ 10 MG/HR STARTED. FAMILY AND FRIENDS AT BEDSIDE.

## 2018-01-11 NOTE — Progress Notes (Signed)
[place] Note  Covering for [facility] chaplain, responded to ICU page for family support as extubation was anticipated to be terminal.   Provided emotional and logistical support to pt, immediate family, family's clergy, and nursing team. Also served as liaison with [place] D[place]/RN, navigator at [facility] and friend of family, per pt's request. Ensured that family and clergy are aware of ongoing [place] availability from [facility] chaplains while in house and from [facility] chaplain for follow-up support as desired. Spoke in most detail with Mr Towles's mother, who has my card and knows that I am available for family support or referrals as needs arise.  As always, please page as needed. Thank you.  [place], [place], [facility] M-F daytime pager [phone] [facility] 24/7 pager [phone] Voicemail [phone]

## 2018-01-11 NOTE — Progress Notes (Signed)
PATIENT PULSELESS, BREATHLESS, PUPILS FIXED AND DILATED, ASYSTOLE NOTED ON MONITOR. FAMILY AT BEDSIDE. BRANDY OLIS, ANP NOTIFIED. TWO RN LISTENED FOR BREATH SOUNDS VERIFIED PT EXPIRATION. Ronnie Doss, RN  AND Earma Reading, RN.

## 2018-01-11 NOTE — Progress Notes (Signed)
Chaplain following for support during extubation / transition to comfort care.   Pt's family at bedside.  [employer] present and supporting family.  Chaplain present on unit.  Consulted with RN for support needs.  In house and available via pager as needs arise.  Will continue to follow.     [facility] Chaplain Jerene Pitch, MDiv, [facility]

## 2018-01-11 NOTE — Progress Notes (Signed)
Nutrition Follow-up  DOCUMENTATION CODES:   Non-severe (moderate) malnutrition in context of chronic illness  INTERVENTION:  - Continue Vital 1.5 @ 45 mL/hr (goal rate) with 60 mL Prostat BID and 30 mL free water every 4 hours.  NUTRITION DIAGNOSIS:   Moderate Malnutrition related to chronic illness, catabolic illness, cancer and cancer related treatments as evidenced by mild muscle depletion, mild fat depletion. -ongoing  GOAL:   Patient will meet greater than or equal to 90% of their needs -met with TF regimen   MONITOR:   Vent status, TF tolerance, Weight trends, Labs  ASSESSMENT:   52 y.o. male with medical history significant of [name], hypertension presenting with worsening shortness of breath with imaging findings concerning for pneumonia versus drug-induced pneumonitis versus progression of his malignancy.  Weight now back down and consistent with weight on 4/16. Used admission weight (85.5 kg) to re-estimate kcal need this AM. Pt remains intubated with OGT in place and family at bedside. He is receiving Vital 1.5 @ 45 mL/hr (goal rate) with 60 mL Prostat BID and 30 mL free water every 4 hours. This regimen is providing 2020 kcal (98% re-estimated kcal need), 133 grams of protein, and 1005 mL free water.   Per Dr. Michelle Piper note yesterday afternoon: start SBTs after completion of steroids, plan for bronch to r/o infection. Note states "should be ready for extubation tomorrow" which would be today.   Patient is currently intubated on ventilator support MV: 11.9 L/min Temp (24hrs), Avg:98.2 F (36.8 C), Min:97.7 F (36.5 C), Max:98.8 F (37.1 C) BP: 98/59 and MAP: 71  Medications reviewed; 20 mg Pepcid per OGT BID.  Labs reviewed; CBGs: 158 and 168 mg/dL this AM, Ca: 8.2 mg/dL.  IVF: NS @ 50 mL/hr.  Drips: Precedex @ 1.2 mcg/kg/hr, Fentanyl @ 300 mcg/hr.      Diet Order:  Diet NPO time specified  EDUCATION NEEDS:   No education  needs have been identified at this time  Skin:  Skin Assessment: Reviewed RN Assessment  Last BM:  4/14 (PTA)  Height:   Ht Readings from Last 1 Encounters:  12/31/2017 6' 2" (1.88 m)    Weight:   Wt Readings from Last 1 Encounters:  January 07, 2018 194 lb 14.2 oz (88.4 kg)    Ideal Body Weight:  86.36 kg  BMI:  Body mass index is 25.02 kg/m.  Estimated Nutritional Needs:   Kcal:  2069  Protein:  106-132 grams (1.2-1.5 grams/kg)  Fluid:  >/= 2.2 L/day      Oscar Matin, MS, RD, LDN, [facility] Inpatient Clinical Dietitian Pager # [phone] After hours/weekend pager # [phone]

## 2018-01-11 NOTE — Death Summary Note (Signed)
DEATH SUMMARY   Patient Details  Name: Oscar Floyd MRN: [id number] DOB: 1966/03/03  Admission/Discharge Information   Admit Date:  2018-01-02  Date of Death: Date of Death: 05-Jan-2018  Time of Death: Time of Death: 11-02-56  Length of Stay: 3  Referring Physician: [name], MD   Reason(s) for Hospitalization  Respiratory failure.  Diagnoses  Preliminary cause of death:  Secondary Diagnoses (including complications and co-morbidities):  Active Problems:   Pneumonia   Respiratory failure ([place])   Malnutrition of moderate degree   Brief Hospital Course (including significant findings, care, treatment, and services provided and events leading to death)  Oscar Floyd is a 52 y.o. year old male who presented with sudden worsening of her shortness of breath.  He has known advanced stage IV lung cancer with no further therapeutic options.  Given the acuity of his symptoms he was given a trial of intubation hoping that his respiratory failure was secondary to known side effect of his brigatinib.  Unfortunately, after 48 hours steroids he had not made significant improvements.  Decided to proceed with a one-way extubation.  The patient remained quite dyspneic but was made comfortable with morphine and was able to speak with his family before passing away.    Pertinent Labs and Studies  Significant Diagnostic Studies Dg Chest 2 View  Result Date: January 02, 2018 CLINICAL DATA:  Shortness of breath.  History of lung carcinoma EXAM: CHEST - 2 VIEW COMPARISON:  November 13, 2017 chest radiograph and chest CT November 01, 2017 FINDINGS: Port-A-Cath tip is at the cavoatrial junction.  No pneumothorax. There is widespread interstitial and alveolar consolidation throughout the lungs bilaterally. Heart size is upper normal with pulmonary vascularity within normal limits. No evident adenopathy. 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 IMPRESSION: Widespread interstitial  and alveolar consolidation. There may well be underlying mass in the left lower lobe. Nodular lesions seen on CT in the lungs are obscured by the diffuse interstitial and alveolar opacity. Differential considerations for the widespread opacity include lymphangitic spread of tumor, atypical infection, ARDS, and allergic type reaction. More than one of these entities may well be present concurrently. Sclerotic bony metastatic lesions throughout the thorax. Port-A-Cath tip at cavoatrial junction.  No pneumothorax evident. Electronically Signed   By: Lowella Grip III M.D.   On: Jan 02, 2018 10:55   Ct Angio Chest Pe W And/or Wo Contrast  Result Date: 01-02-2018 CLINICAL DATA:  52 year old male with stage IV lung cancer. Recent onset, progressive shortness of breath after beginning a new treatment (Brigatinib). EXAM: CT ANGIOGRAPHY CHEST WITH CONTRAST TECHNIQUE: Multidetector CT imaging of the chest was performed using the standard protocol during bolus administration of intravenous contrast. Multiplanar CT image reconstructions and MIPs were obtained to evaluate the vascular anatomy. CONTRAST:  97m ISOVUE-370 IOPAMIDOL (ISOVUE-370) INJECTION 76% COMPARISON:  Chest radiographs 1042 hours today and earlier, including CTA chest 11/01/2017 FINDINGS: Cardiovascular: Adequate contrast bolus timing in the pulmonary arterial tree. Intermittent motion artifact in both lungs. There is no central or saddle pulmonary embolus. About the hila, no definite pulmonary artery filling defect is identified, and the low-density thrombus seen at the right hilum in FFeb 20, 2024is no longer evident. The distal branches are not well evaluated. Negative visible aorta. Stable cardiac size, no cardiomegaly or pleural effusion. No definite calcified coronary artery atherosclerosis, although there is pulsation and motion artifact. Stable right chest porta cath. Mediastinum/Nodes: No mediastinal lymphadenopathy. Lungs/Pleura: Worsening bilateral  ground-glass and confluent pulmonary opacity since the F02-20-24CTA. There  is confluent peribronchial involvement throughout both lungs. There is continued dense peribronchial consolidation in the left lower lobe. The major airways remain patent. A small layering left pleural effusion is new since February. There is also new trace right pleural fluid. Upper Abdomen: Stable and negative visible upper abdomen. Musculoskeletal: Diffuse sclerotic bone metastases. No new osseous abnormality identified in the chest. Review of the MIP images confirms the above findings. IMPRESSION: 1. No central or hilar pulmonary embolus identified today. The distal branches are not well evaluated. 2. Unresolved, progressed, and now severe bilateral widespread airspace opacity since the February CTA. Diffuse, confluent pulmonary ground-glass opacity with some septal thickening, and continued dense left lower lobe peribronchial consolidation - such as due to a severe pneumonia. This is nonspecific, and bronchoscopy may be helpful if the patient is stable. On review of the literature there is a known severe lung toxicity to ALK Inhibitors such as Brigatinib, which can have a radiographic pattern of pneumonia, although the changes seen today began in February. 3. New small layering pleural effusions greater on the left. 4. Diffuse osseous metastatic disease. Electronically Signed   By: Genevie Ann M.D.   On: 12/23/2017 14:45   Mr [name] OI Contrast  Result Date: 12/09/2017 CLINICAL DATA:  Follow-up treated brain metastases. Non-small cell lung cancer with whole-brain radiotherapy. EXAM: MRI HEAD WITHOUT AND WITH CONTRAST TECHNIQUE: Multiplanar, multiecho pulse sequences of the brain and surrounding structures were obtained without and with intravenous contrast. CONTRAST:  24m MULTIHANCE GADOBENATE DIMEGLUMINE 529 MG/ML IV SOLN COMPARISON:  07/11/2017 FINDINGS: Brain: There are numerous new areas of enhancement in the cerebellum and  bilateral cerebrum, at least 32 in number. Nearly all are subcentimeter in size (a 16 mm elongated focus is present along the posterior and lateral left temporal lobe). These are primarily concerning for areas of interval/recurrent metastatic disease. Suspect at least some of these are subacute enhancing infarcts - the left posterior and lateral temporal lesion is elongated, atypical for metastasis, and there is variable restricted diffusion in the bilateral cerebral white matter and areas of interval small-vessel type infarct in the white matter that do not enhance or restrict diffusion. Patient had recent pulmonary embolism, consider recurrent paradoxical emboli. Numerous remote micro hemorrhages, increased in number. No acute hemorrhage, hydrocephalus, or collection. Age normal brain volume. History of whole-brain radiotherapy without notable generalized leukoencephalopathy. Vascular: Major flow voids and vascular enhancements are preserved Skull and upper cervical spine: Multiple bone metastases were seen previously. There has been confluent decreased signal in the skull base and upper cervical spine without avid enhancement. This may be related to interval chemotherapy and anemia, confluent hypoenhancing bone metastases are not excluded. Sinuses/Orbits: Negative. These results will be called to the ordering clinician or representative by the Radiologist Assistant, and communication documented in the PACS or zVision Dashboard. IMPRESSION: 1. At least 32 areas of new enhancement in the cerebrum and cerebellum. This is primarily concerning for recurrent disease, but there are multiple findings which also implicate interval ischemia and enhancing subacute infarcts are likely present/coexistent. Recent pulmonary embolism, consider paradoxical emboli. The enhancing areas are small and there is no mass effect. 2. History of osseous metastatic disease with progressive marrow signal abnormality in the skull base and  cervical spine. The confluent appearance and limited enhancement favors that this change is from interval chemotherapy/anemia, but progressive bony metastatic disease may be present. Electronically Signed   By: JMonte FantasiaM.D.   On: 12/09/2017 11:24   Dg Chest Port 1  View  Result Date: 12/27/2017 CLINICAL DATA:  ARDS EXAM: PORTABLE CHEST 1 VIEW COMPARISON:  12/17/2017 FINDINGS: Endotracheal tube, nasogastric catheter and right chest wall port are again seen and stable. Cardiac shadow is stable. Diffuse infiltrative changes are again identified throughout both lungs stable from the prior study. No new focal abnormality is seen. IMPRESSION: Diffuse bilateral infiltrates stable from the previous exam. Electronically Signed   By: Inez Catalina M.D.   On: 12/27/2017 14:45   Dg Chest Port 1 View  Result Date: 12/27/2017 CLINICAL DATA:  Acute respiratory failure with hypoxia. EXAM: PORTABLE CHEST 1 VIEW COMPARISON:  Radiographs of December 26, 2017. FINDINGS: Stable cardiomediastinal silhouette. Endotracheal and nasogastric tubes are unchanged in position. Right internal jugular Port-A-Cath is unchanged in position. Stable bilateral diffuse interstitial densities throughout both lungs concerning for pulmonary edema or inflammation. No pneumothorax is noted. Stable left basilar opacity is noted concerning for atelectasis or infiltrate with associated pleural effusion. Bony thorax is unremarkable. IMPRESSION: Stable support apparatus. Stable bilateral diffuse interstitial densities concerning for edema or inflammation. Stable left basilar opacity as described above. Electronically Signed   By: Marijo Conception, M.D.   On: 12/27/2017 07:09   Dg Chest Port 1 View  Result Date: 01/07/2018 CLINICAL DATA:  Lung cancer EXAM: PORTABLE CHEST 1 VIEW COMPARISON:  Earlier the same day FINDINGS: 1750 hours. Endotracheal tube tip is 4.2 cm above the base of the carina. Right Port-A-Cath tip overlies the distal SVC. Heart size  stable. Diffuse interstitial and basilar airspace opacity again noted. NG tube tip overlies the mid stomach. The visualized bony structures of the thorax are intact. Telemetry leads overlie the chest. IMPRESSION: 1. Endotracheal tube tip 4.2 cm above the base the carina. 2. NG tube tip positioned in the mid stomach. 3. No substantial change cardiopulmonary exam. Electronically Signed   By: Misty Stanley M.D.   On: 12/31/2017 18:34    Microbiology Recent Results (from the past 240 hour(s))  Blood culture (routine x 2)     Status: None (Preliminary result)   Collection Time: 12/24/2017 12:05 PM  Result Value Ref Range Status   Specimen Description   Final    BLOOD PORTA CATH Performed at [facility], [place] [address], [place], [place] [address]    Special Requests   Final    BOTTLES DRAWN AEROBIC AND ANAEROBIC Blood Culture adequate volume Performed at [place] [address], [place], [place] [address]    Culture   Final    NO GROWTH 3 DAYS Performed at [facility], [place] [address] [place], [place] [address]    Report Status PENDING  Incomplete  Blood culture (routine x 2)     Status: None (Preliminary result)   Collection Time: 12/25/2017 12:08 PM  Result Value Ref Range Status   Specimen Description   Final    BLOOD LEFT HAND Performed at [facility] [address] [place], [place] [address]    Special Requests   Final    BOTTLES DRAWN AEROBIC AND ANAEROBIC Blood Culture results may not be optimal due to an inadequate volume of blood received in culture bottles Performed at [facility] [place] [address] [place], [place] [address]    Culture   Final    NO GROWTH 3 DAYS Performed at [facility], [place] [address], [place], [place] [address]    Report Status PENDING  Incomplete  MRSA PCR Screening  Status: None   Collection Time: 01/07/2018  6:23 PM  Result Value Ref Range Status   MRSA by  PCR NEGATIVE NEGATIVE Final    Comment:        The GeneXpert MRSA Assay (FDA approved for NASAL specimens only), is one component of a comprehensive MRSA colonization surveillance program. It is not intended to diagnose MRSA infection nor to guide or monitor treatment for MRSA infections. Performed at [facility], [place] [address] [place], [place] [address]   Culture, respiratory (NON-Expectorated)     Status: None (Preliminary result)   Collection Time: 12/27/17  2:04 PM  Result Value Ref Range Status   Specimen Description   Final    TRACHEAL ASPIRATE Performed at [facility] [address], [place], [place] [address]    Special Requests   Final    NONE Performed at [facility], [place] [address] [place], [place] [address]    Gram Stain   Final    ABUNDANT WBC PRESENT,BOTH PMN AND MONONUCLEAR NO ORGANISMS SEEN    Culture   Final    NO GROWTH 2 DAYS Performed at [facility], [place] [address] [place], [place] [address]    Report Status PENDING  Incomplete    Lab Basic Metabolic Panel: Recent Labs  Lab 12/28/2017 1040 12/27/17 0300 12/28/17 0402 01-15-18 0411  NA 135 140 141 141  K 4.1 5.1 4.1 3.9  CL 104 111 110 110  CO2 20* 21* 22 24  GLUCOSE 121* 114* 160* 169*  BUN _0 CREATININE 1.12 1.12 0.84 0.81  CALCIUM 8.4* 7.7* 8.0* 8.2*  MG  --  1.7  --   --   PHOS  --  2.0*  --   --    Liver Function Tests: Recent Labs  Lab 12/12/2017 1040 12/27/17 0300  AST 38 31  ALT 19 17  ALKPHOS 690* 610*  BILITOT 1.4* 1.1  PROT 6.6 5.8*  ALBUMIN 2.8* 2.3*   No results for input(s): LIPASE, AMYLASE in the last 168 hours. No results for input(s): AMMONIA in the last 168 hours. CBC: Recent Labs  Lab 01/08/2018 1040 12/27/17 0300 12/28/17 0402 12/28/17 1800 01/15/2018 0411  WBC 7.4 4.4 4.1  --  4.9  NEUTROABS 4.8  --  3.6  --  4.3  HGB 9.3* 7.5* 7.0* 8.2* 7.6*  HCT 29.3* 24.3* 22.2* 26.5* 23.8*   MCV 95.1 96.8 96.9  --  94.1  PLT 112* 87* 86*  --  82*   Cardiac Enzymes: No results for input(s): CKTOTAL, CKMB, CKMBINDEX, TROPONINI in the last 168 hours. Sepsis Labs: Recent Labs  Lab 12/22/2017 1040 12/30/2017 1224 12/27/17 0300 12/28/17 0402 01-15-18 0411  WBC 7.4  --  4.4 4.1 4.9  LATICACIDVEN  --  1.62  --   --   --     Procedures/Operations  Intubation mechanical ventilation.   Cas Tracz Jan 15, 2018, 6:07 PM

## 2018-01-11 DEATH — deceased

## 2018-04-08 IMAGING — DX DG CHEST 1V PORT
1 series · 1 of 1 positions shown · non-contrast
Comparison: 11/10/2017.

CLINICAL DATA: Shortness of breath. Lung cancer. Metastatic
disease.

EXAM:
PORTABLE CHEST 1 VIEW

[chest ap]
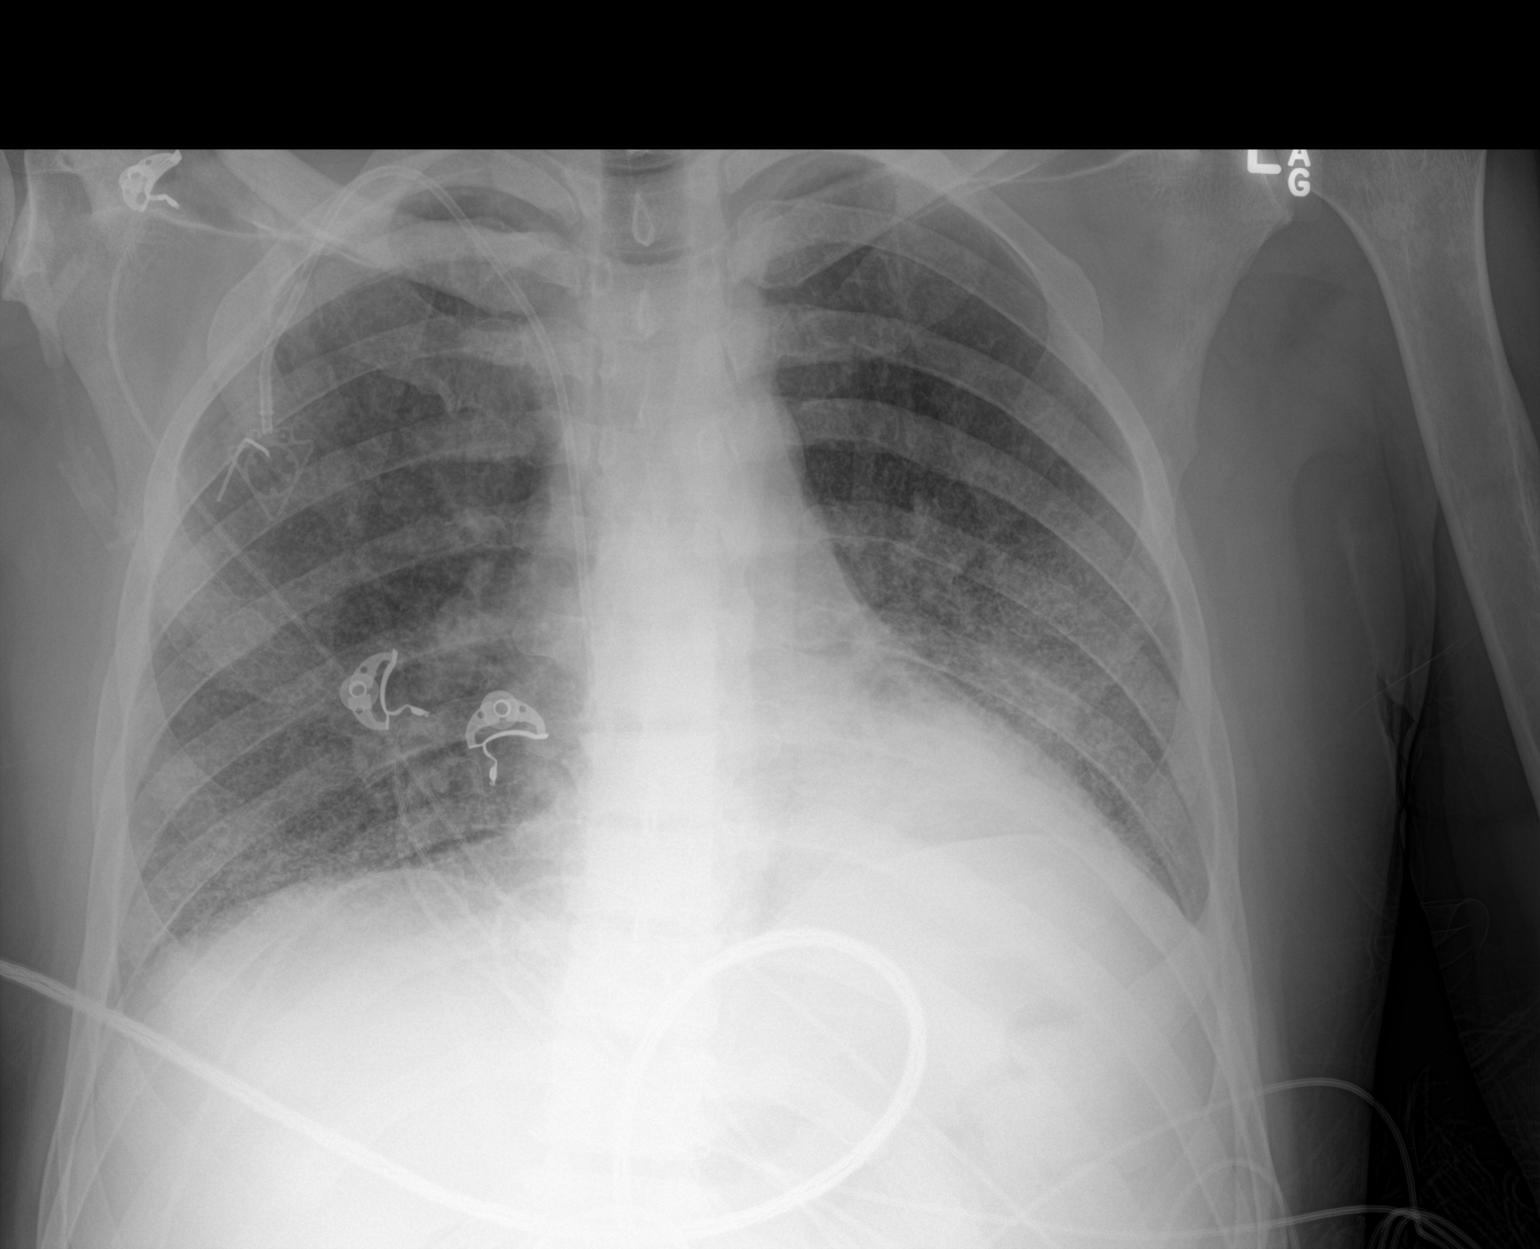

[1 of 1 positions shown; findings below may reference images not displayed]

FINDINGS: Right IJ line scratched it right IJ scratched it right scratched it
PowerPort catheter stable position. Heart size stable. Persistent
unchanged diffuse pulmonary interstitial prominence. No pleural
effusion or pneumothorax. Blastic bony changes noted consistent with
known blastic metastatic disease.
IMPRESSION: 1.  PowerPort catheter stable position.

2. Diffuse bilateral pulmonary interstitial prominence unchanged
from prior exam. Left lung base atelectasis/consolidation unchanged.

3. Blastic bony densities are again noted consistent with known
blastic metastatic disease.

## 2018-04-09 IMAGING — DX DG CHEST 1V PORT
1 series · 1 of 1 positions shown · non-contrast
Comparison: 11/11/2017 and older exams.

CLINICAL DATA: SOB (shortness of breath)Hx HTN, lung ca and
bone,liver and brain mets

EXAM:
PORTABLE CHEST 1 VIEW

[chest ap]
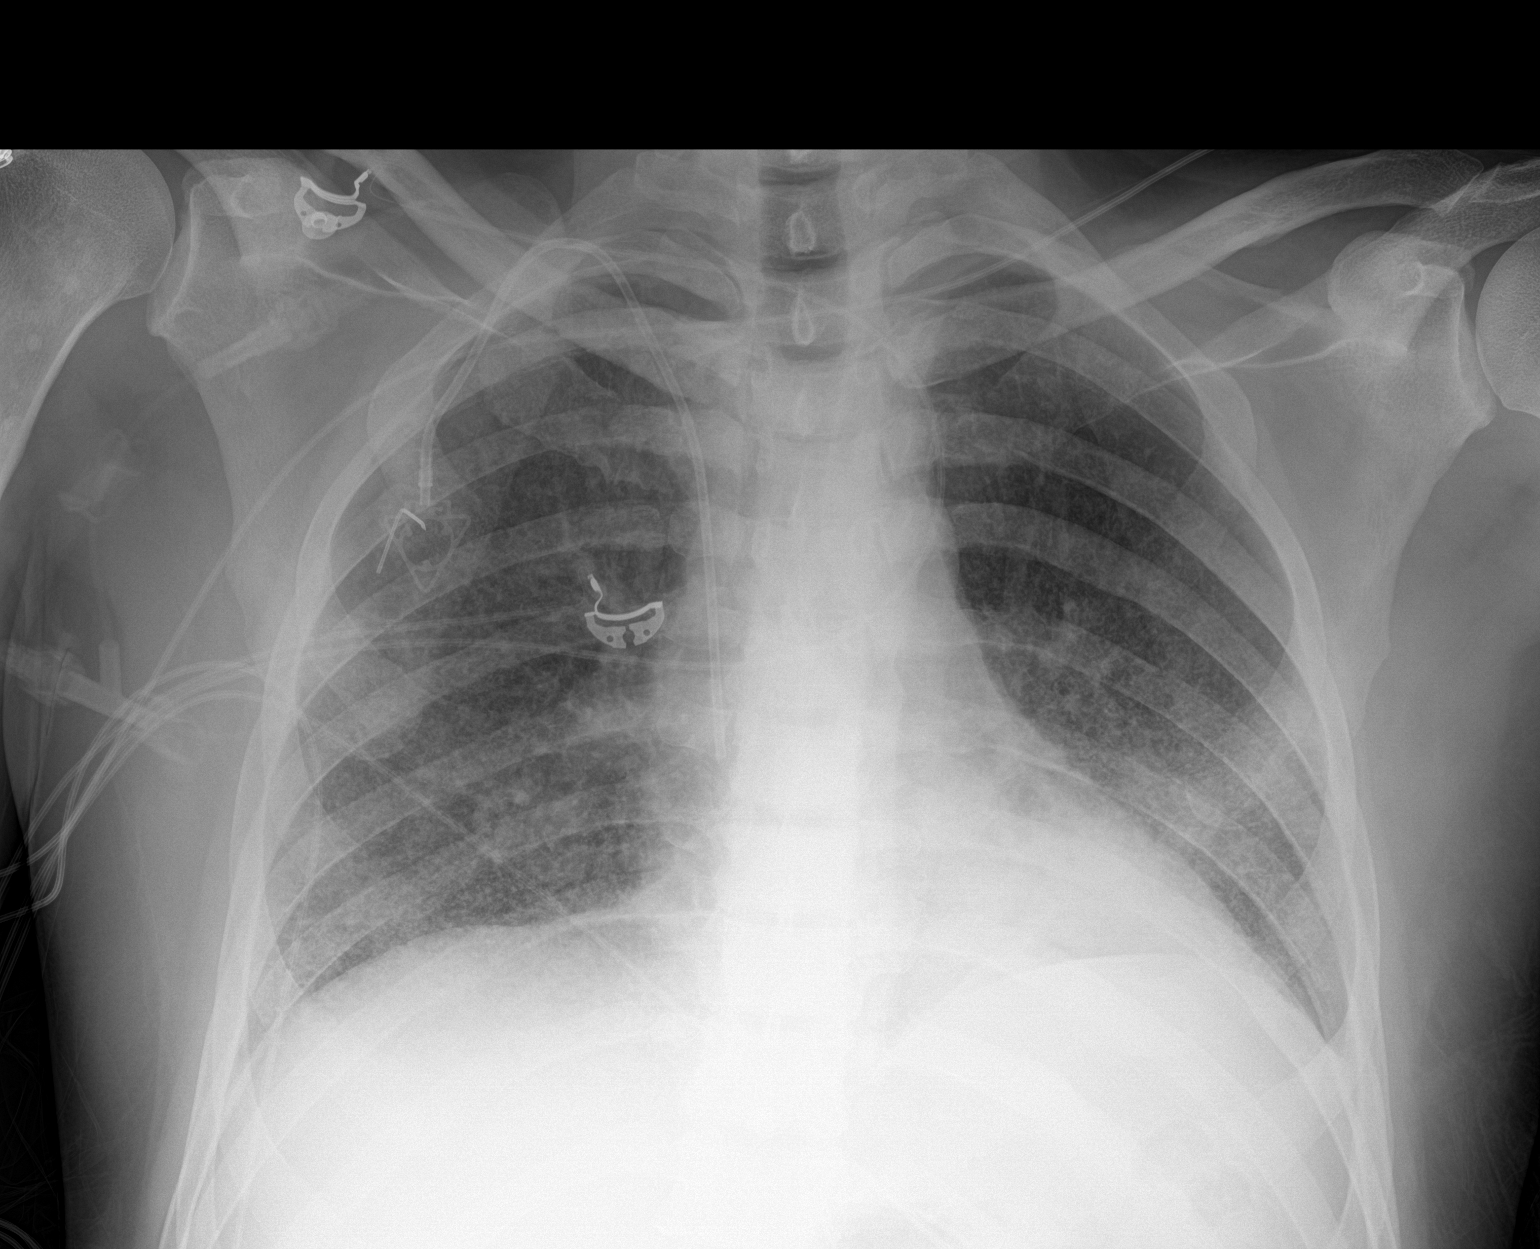

[1 of 1 positions shown; findings below may reference images not displayed]

FINDINGS: There has been mild improvement in the hazy airspace lung opacities,
most evident in the right upper lobe, when compared to the study
dated 11/08/2017. No new lung abnormalities.

The cardiac silhouette is normal in size. No mediastinal or hilar
masses or evidence of adenopathy.

Right-sided Port-A-Cath is stable.
IMPRESSION: 1. Mild improvement in airspace lung opacities when compared to the
study dated 11/08/2017. No new abnormalities.

## 2018-05-23 IMAGING — CT CT ANGIO CHEST
2 of 7 series · 18 of 46 positions shown · IV contrast (ISOVUE)
Comparison: Chest radiographs 7599 hours today and earlier,
including CTA chest 11/01/2017

CLINICAL DATA: 52-year-old male with stage IV lung cancer. Recent
onset, progressive shortness of breath after beginning a new
treatment (Brigatinib).

EXAM:
CT ANGIOGRAPHY CHEST WITH CONTRAST
TECHNIQUE: Multidetector CT imaging of the chest was performed using the
standard protocol during bolus administration of intravenous
contrast. Multiplanar CT image reconstructions and MIPs were
obtained to evaluate the vascular anatomy.
CONTRAST:  55mL VCBYK9-BI9 IOPAMIDOL (VCBYK9-BI9) INJECTION 76%

[Series 5: thins · axial · 0.85mm/px · z∈[-352,-112]mm · 15 of 273 slices shown]
[im 17/273  lung]
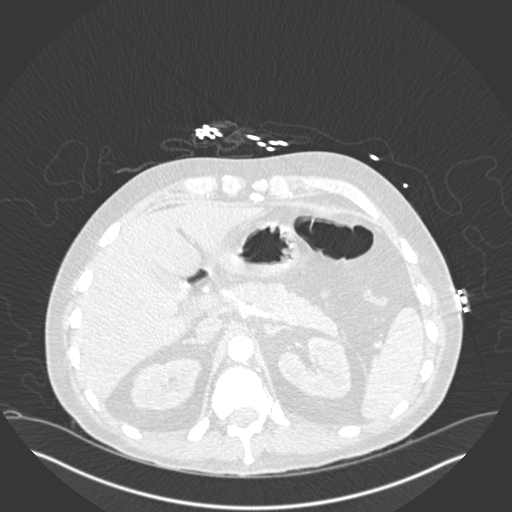
[im 33/273  soft-tissue]
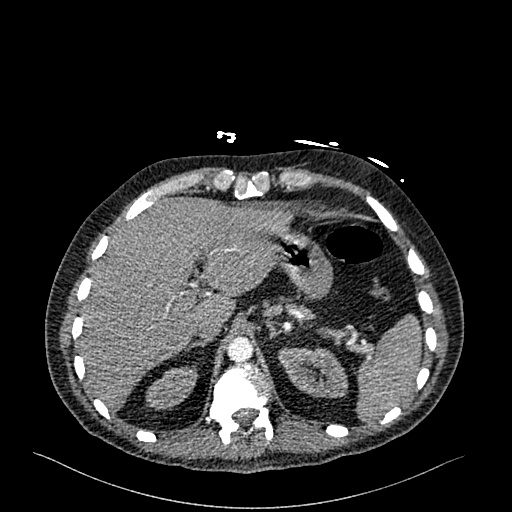
[im 49/273  lung]
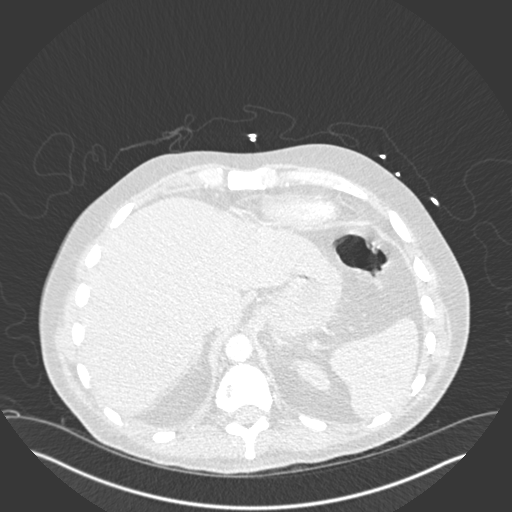
[im 65/273  soft-tissue]
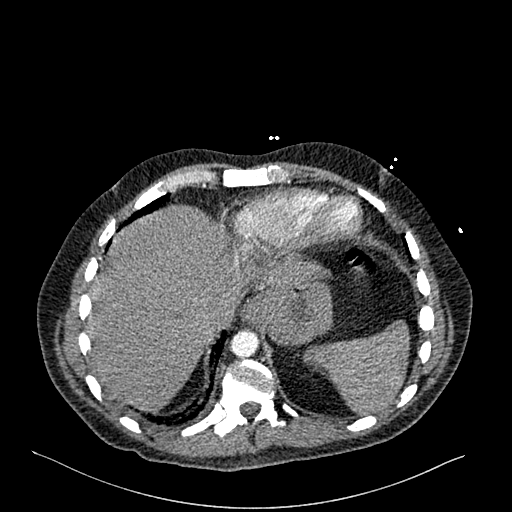
[im 81/273  lung]
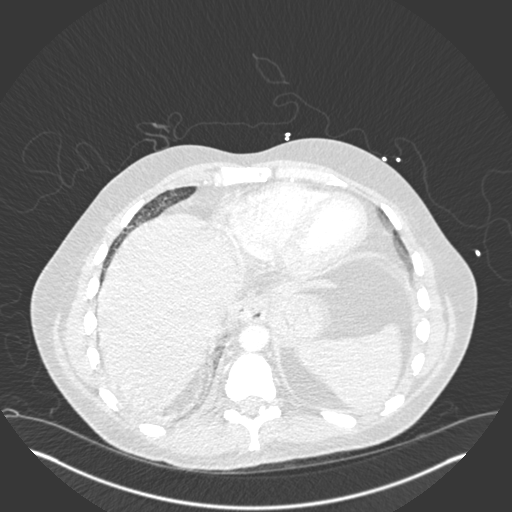
[im 97/273  soft-tissue]
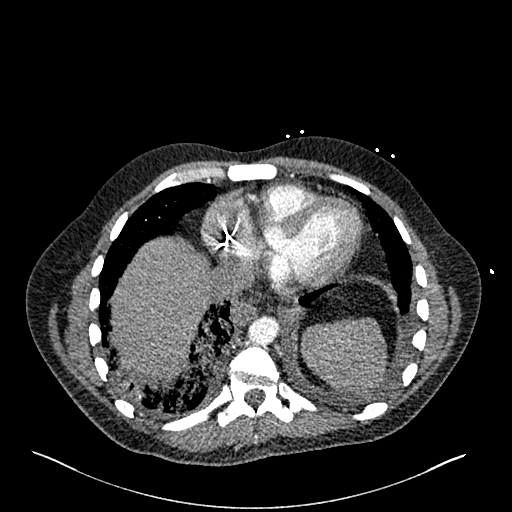
[im 113/273  lung]
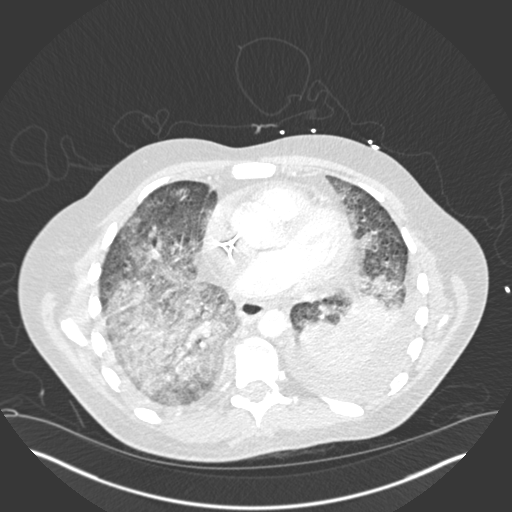
[im 145/273  soft-tissue]
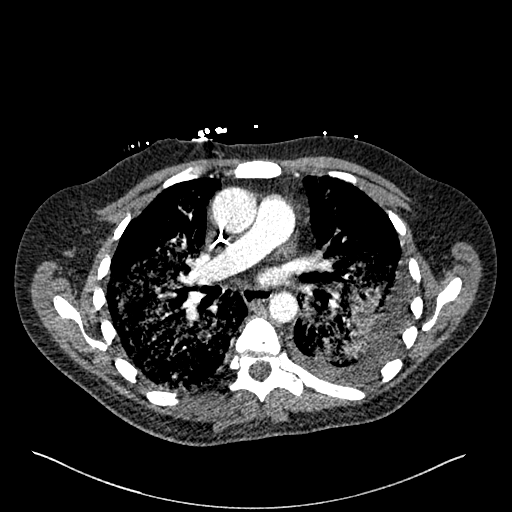
[im 161/273  lung]
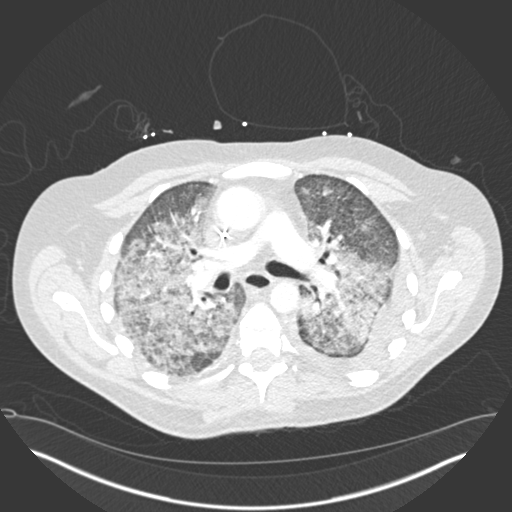
[im 177/273  soft-tissue]
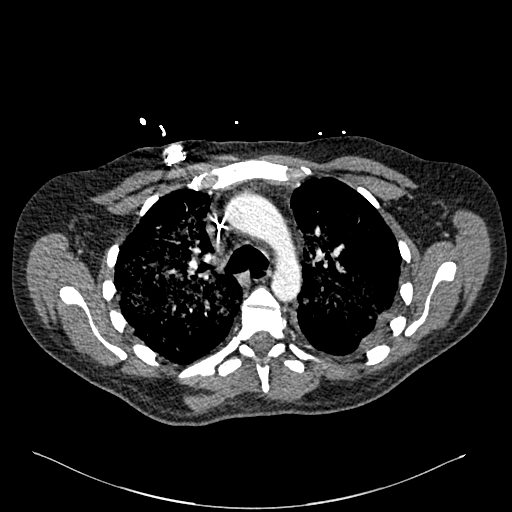
[im 193/273  lung]
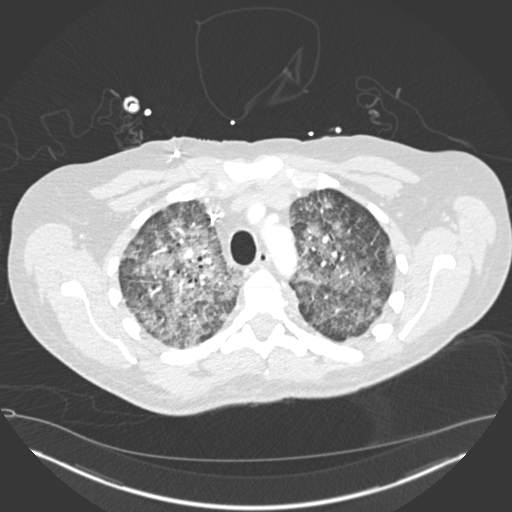
[im 209/273  soft-tissue]
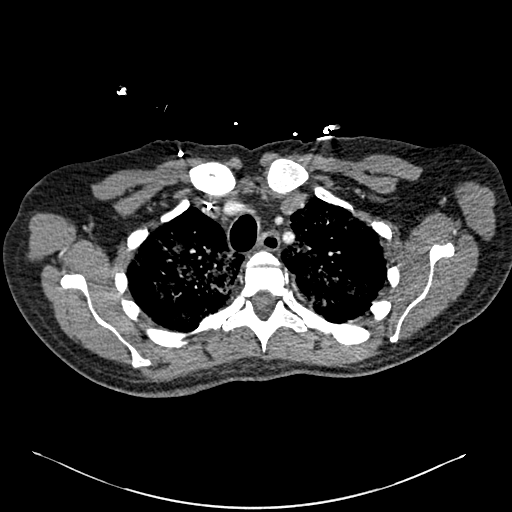
[im 225/273  lung]
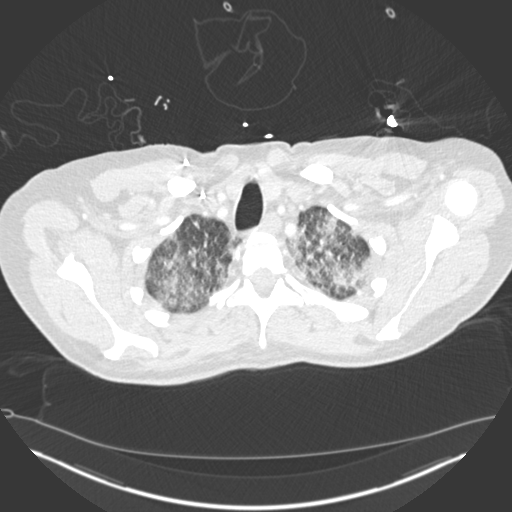
[im 241/273  soft-tissue]
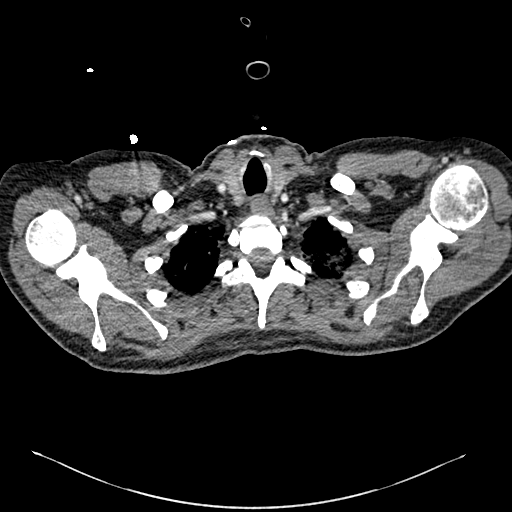
[im 257/273  lung]
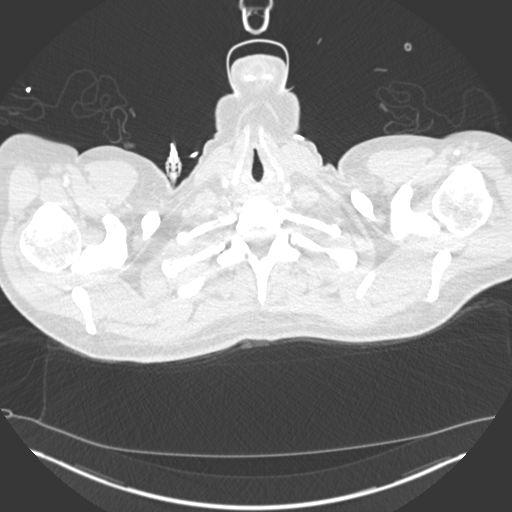

[Series 6: coronal mpr · coronal · 0.72mm/px · 3 of 127 slices shown]
[im 32/127  soft-tissue]
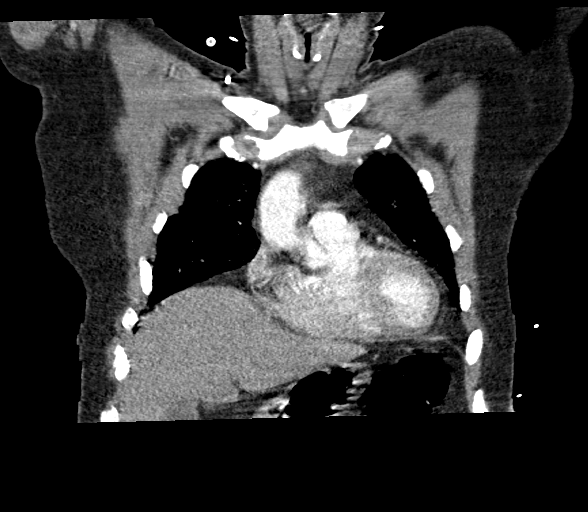
[im 64/127  soft-tissue]
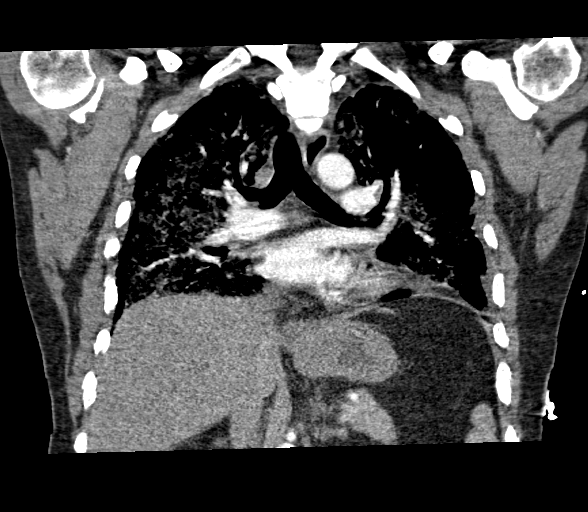
[im 95/127  soft-tissue]
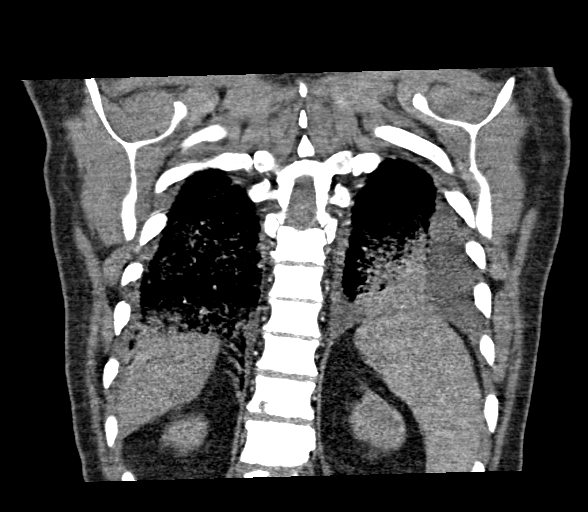

[18 of 46 positions shown; findings below may reference images not displayed]

FINDINGS: Cardiovascular: Adequate contrast bolus timing in the pulmonary
arterial tree. Intermittent motion artifact in both lungs. There is
no central or saddle pulmonary embolus. About the hila, no definite
pulmonary artery filling defect is identified, and the low-density
thrombus seen at the right hilum in [REDACTED] is no longer evident.
The distal branches are not well evaluated.

Negative visible aorta. Stable cardiac size, no cardiomegaly or
pleural effusion. No definite calcified coronary artery
atherosclerosis, although there is pulsation and motion artifact.
Stable right chest porta cath.

Mediastinum/Nodes: No mediastinal lymphadenopathy.

Lungs/Pleura: Worsening bilateral ground-glass and confluent
pulmonary opacity since the [facility] CTA. There is confluent
peribronchial involvement throughout both lungs. There is continued
dense peribronchial consolidation in the left lower lobe. The major
airways remain patent.

A small layering left pleural effusion is new since [REDACTED]. There
is also new trace right pleural fluid.

Upper Abdomen: Stable and negative visible upper abdomen.

Musculoskeletal: Diffuse sclerotic bone metastases. No new osseous
abnormality identified in the chest.

Review of the MIP images confirms the above findings.
IMPRESSION: 1. No central or hilar pulmonary embolus identified today. The
distal branches are not well evaluated.
2. Unresolved, progressed, and now severe bilateral widespread
airspace opacity since the [facility] CTA.
Diffuse, confluent pulmonary ground-glass opacity with some septal
thickening, and continued dense left lower lobe peribronchial
consolidation - such as due to a severe pneumonia.
This is nonspecific, and bronchoscopy may be helpful if the patient
is stable.
On review of the literature there is a known severe lung toxicity to
ALK Inhibitors such as Brigatinib, which can have a radiographic
pattern of pneumonia, although the changes seen today began in
[DATE]. New small layering pleural effusions greater on the left.
4. Diffuse osseous metastatic disease.

## 2020-01-21 IMAGING — US US ABDOMEN LIMITED
1 series · 14 of 25 positions shown · non-contrast
Comparison: CT 10/21/2017

CLINICAL DATA: Abnormal LFT

EXAM:
ULTRASOUND ABDOMEN LIMITED RIGHT UPPER QUADRANT

[Series 1: us abdomen limited · 0.23mm/px · 14 of 52 slices shown]
[im 1/52]
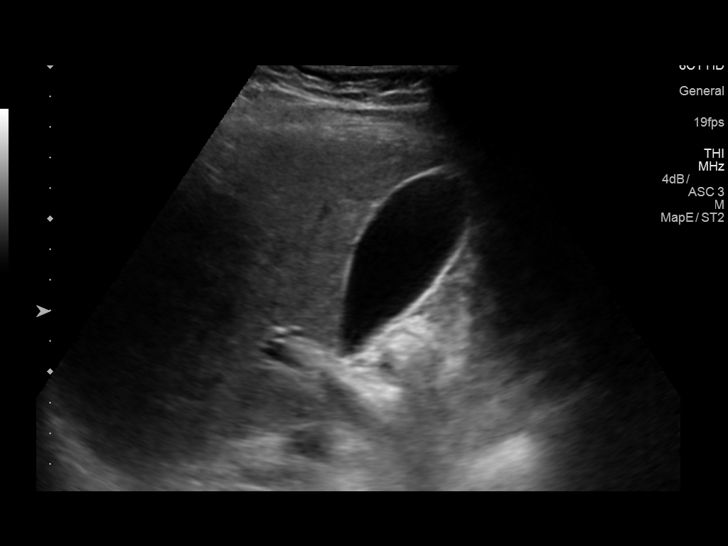
[im 5/52]
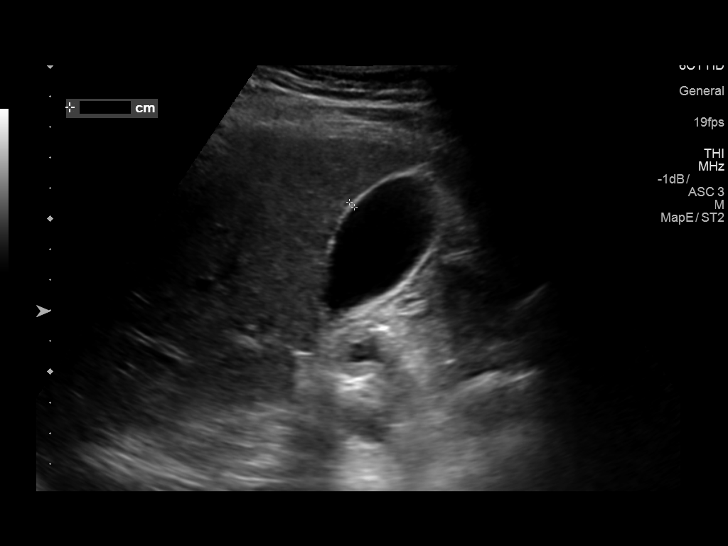
[im 9/52]
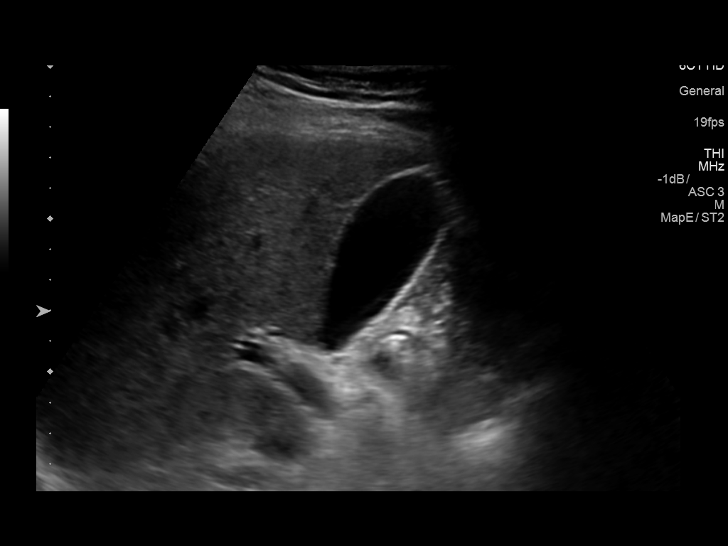
[im 13/52]
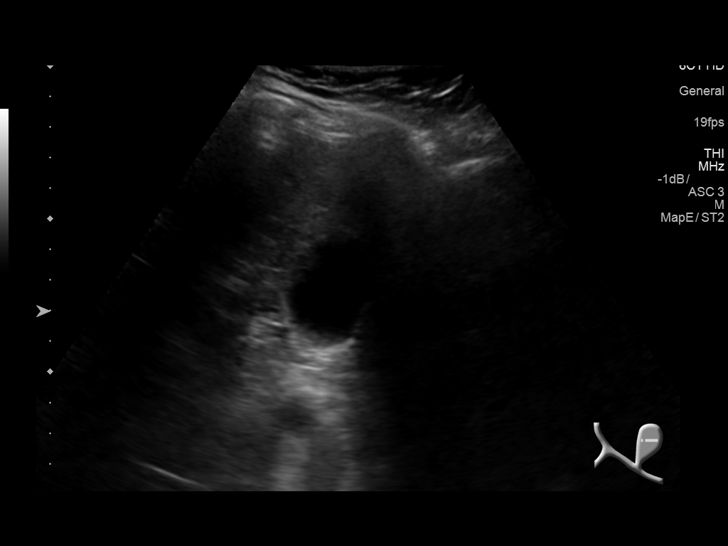
[im 18/52]
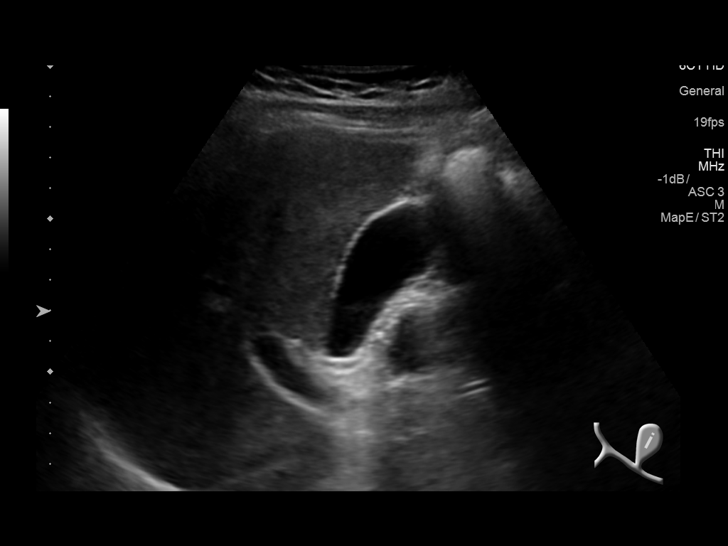
[im 20/52]
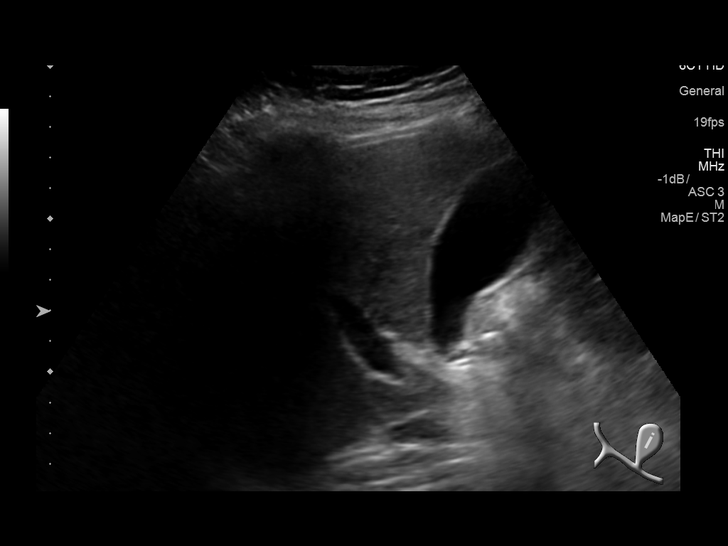
[im 24/52]
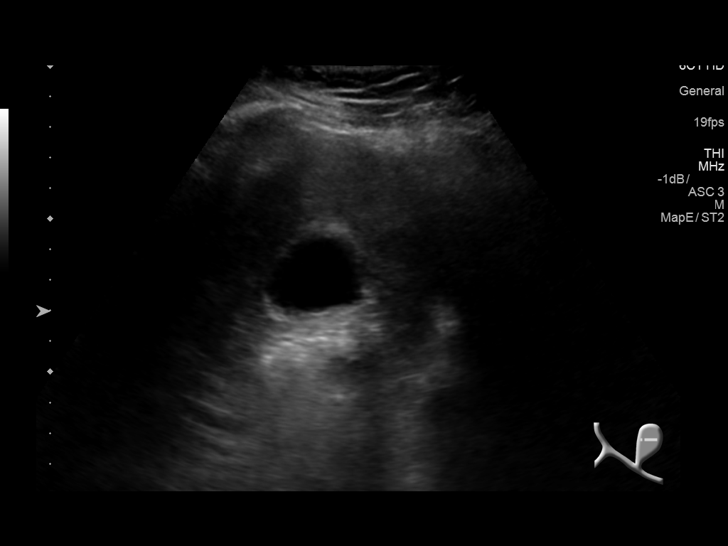
[im 28/52]
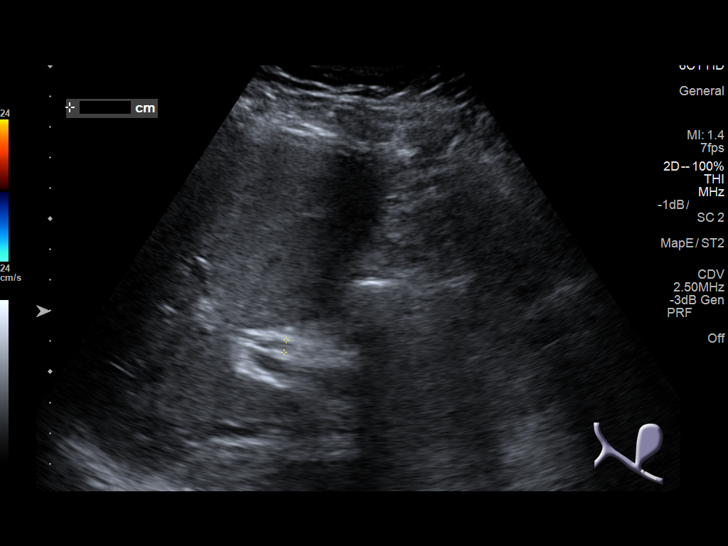
[im 32/52]
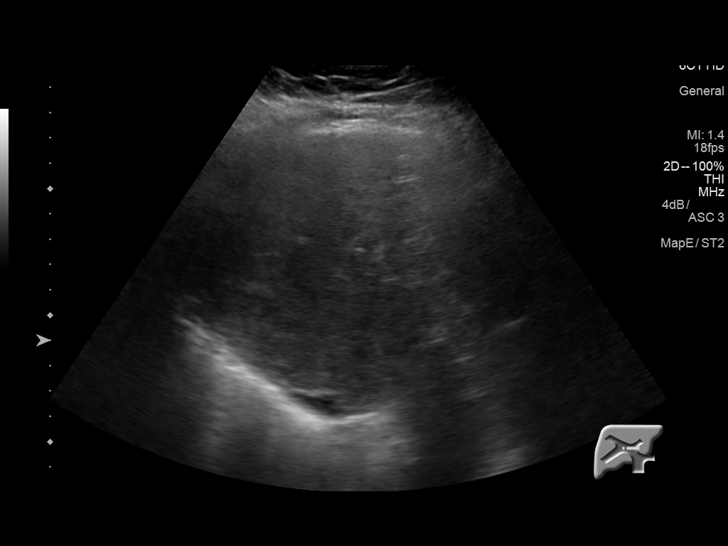
[im 35/52]
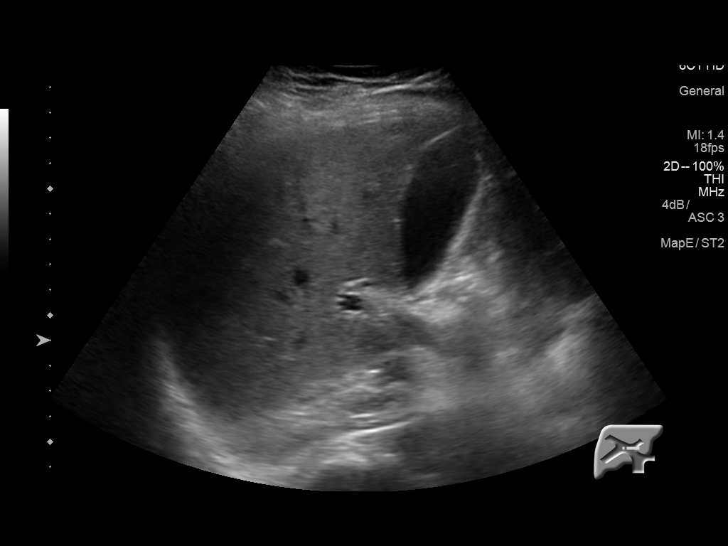
[im 39/52]
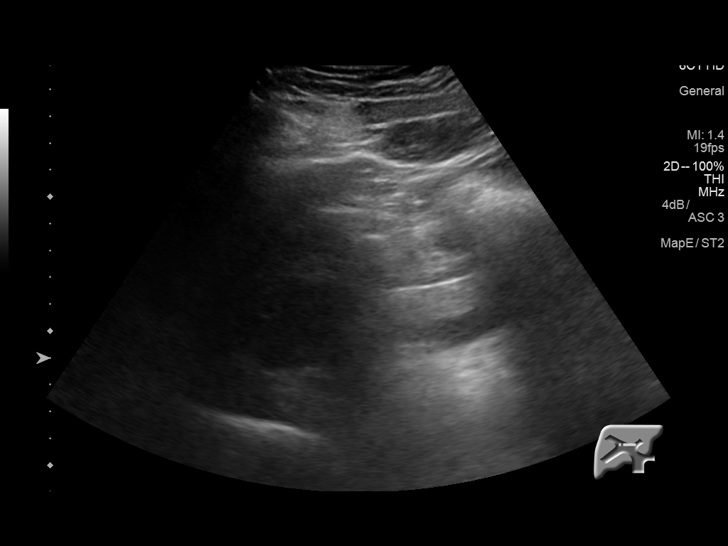
[im 43/52]
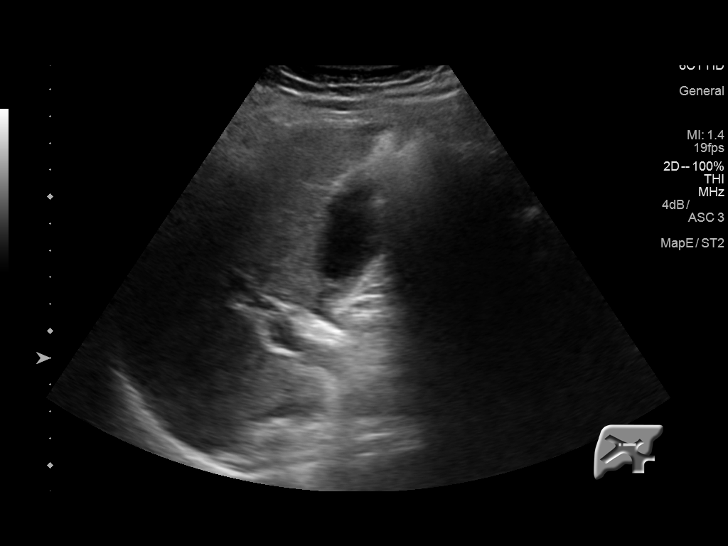
[im 47/52]
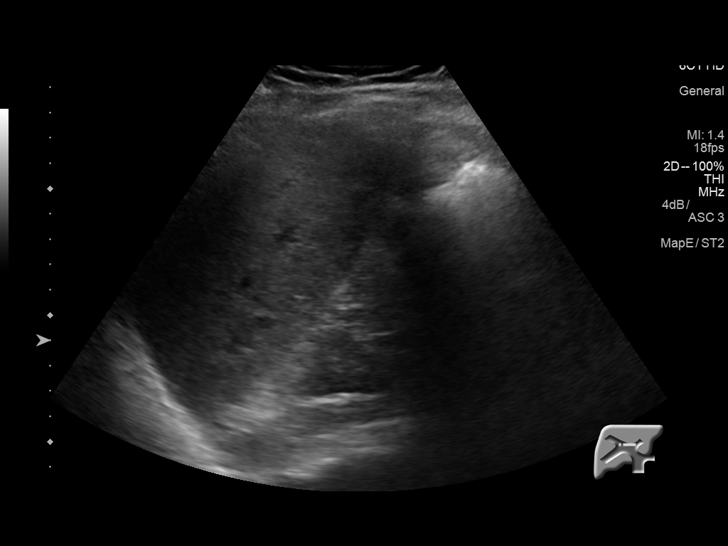
[im 52/52]
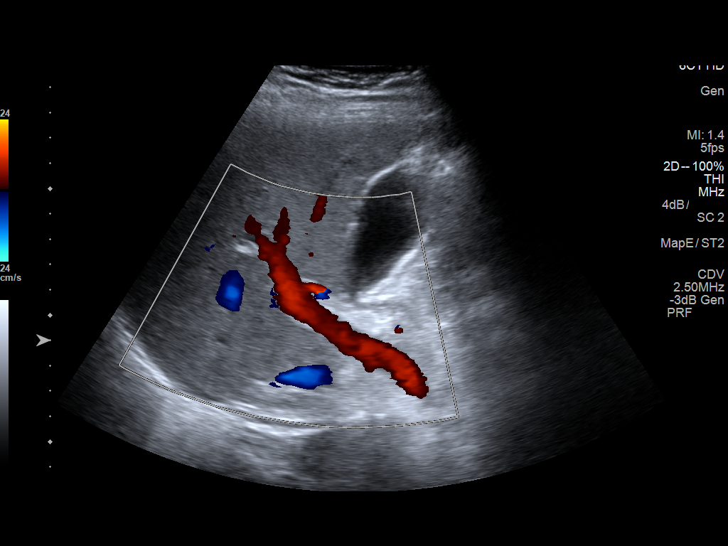

[14 of 25 positions shown; findings below may reference images not displayed]

FINDINGS: Gallbladder:

No gallstones or wall thickening visualized. No sonographic Murphy
sign noted by sonographer.

Common bile duct:

Diameter: 4 mm

Liver:

Limited evaluation of the liver due to patient inability to suspend
breathing. No definite focal hepatic abnormality is seen. Portal
vein is patent on color Doppler imaging with normal direction of
blood flow towards the liver.
IMPRESSION: 1. Negative for gallstones or biliary dilatation
2. Slightly limited evaluation of the liver as above. No specific
abnormality is seen
